# Patient Record
Sex: Male | Born: 1965 | Race: White | Hispanic: No | Marital: Married | Smoking: Never smoker
Health system: Southern US, Community
[De-identification: ages and names within clinical notes are randomized; demographics above are authoritative.]

## PROBLEM LIST (undated history)

## (undated) DIAGNOSIS — J111 Influenza due to unidentified influenza virus with other respiratory manifestations: Secondary | ICD-10-CM

## (undated) DIAGNOSIS — G6281 Critical illness polyneuropathy: Secondary | ICD-10-CM

## (undated) DIAGNOSIS — R5381 Other malaise: Secondary | ICD-10-CM

## (undated) DIAGNOSIS — J9690 Respiratory failure, unspecified, unspecified whether with hypoxia or hypercapnia: Secondary | ICD-10-CM

## (undated) DIAGNOSIS — G629 Polyneuropathy, unspecified: Secondary | ICD-10-CM

## (undated) DIAGNOSIS — M6282 Rhabdomyolysis: Secondary | ICD-10-CM

## (undated) DIAGNOSIS — N179 Acute kidney failure, unspecified: Secondary | ICD-10-CM

## (undated) DIAGNOSIS — D649 Anemia, unspecified: Secondary | ICD-10-CM

## (undated) HISTORY — DX: Acute kidney failure, unspecified: N17.9

## (undated) HISTORY — DX: Polyneuropathy, unspecified: G62.9

## (undated) HISTORY — PX: WISDOM TOOTH EXTRACTION: SHX21

## (undated) HISTORY — DX: Other malaise: R53.81

## (undated) HISTORY — DX: Critical illness polyneuropathy: G62.81

## (undated) HISTORY — DX: Respiratory failure, unspecified, unspecified whether with hypoxia or hypercapnia: J96.90

## (undated) HISTORY — DX: Influenza due to unidentified influenza virus with other respiratory manifestations: J11.1

## (undated) HISTORY — PX: TONSILLECTOMY: SUR1361

## (undated) HISTORY — PX: NASAL SINUS SURGERY: SHX719

## (undated) HISTORY — DX: Rhabdomyolysis: M62.82

## (undated) HISTORY — DX: Anemia, unspecified: D64.9

---

## 2004-07-22 ENCOUNTER — Ambulatory Visit: Payer: Self-pay | Admitting: Family Medicine

## 2004-12-26 ENCOUNTER — Ambulatory Visit: Payer: Self-pay | Admitting: Family Medicine

## 2006-09-21 ENCOUNTER — Ambulatory Visit: Payer: Self-pay | Admitting: Family Medicine

## 2006-10-29 ENCOUNTER — Telehealth (INDEPENDENT_AMBULATORY_CARE_PROVIDER_SITE_OTHER): Payer: Self-pay | Admitting: *Deleted

## 2007-01-29 ENCOUNTER — Telehealth: Payer: Self-pay | Admitting: Family Medicine

## 2007-03-23 ENCOUNTER — Ambulatory Visit: Payer: Self-pay | Admitting: Family Medicine

## 2007-03-23 DIAGNOSIS — R0609 Other forms of dyspnea: Secondary | ICD-10-CM | POA: Insufficient documentation

## 2007-03-23 DIAGNOSIS — R0989 Other specified symptoms and signs involving the circulatory and respiratory systems: Secondary | ICD-10-CM | POA: Insufficient documentation

## 2007-03-23 DIAGNOSIS — J069 Acute upper respiratory infection, unspecified: Secondary | ICD-10-CM | POA: Insufficient documentation

## 2007-04-06 ENCOUNTER — Telehealth (INDEPENDENT_AMBULATORY_CARE_PROVIDER_SITE_OTHER): Payer: Self-pay | Admitting: Internal Medicine

## 2007-12-02 ENCOUNTER — Ambulatory Visit: Payer: Self-pay | Admitting: Family Medicine

## 2007-12-02 DIAGNOSIS — J301 Allergic rhinitis due to pollen: Secondary | ICD-10-CM | POA: Insufficient documentation

## 2008-01-10 ENCOUNTER — Ambulatory Visit: Payer: Self-pay | Admitting: Family Medicine

## 2008-01-10 DIAGNOSIS — J310 Chronic rhinitis: Secondary | ICD-10-CM | POA: Insufficient documentation

## 2008-01-18 ENCOUNTER — Telehealth (INDEPENDENT_AMBULATORY_CARE_PROVIDER_SITE_OTHER): Payer: Self-pay | Admitting: Internal Medicine

## 2008-01-20 ENCOUNTER — Telehealth (INDEPENDENT_AMBULATORY_CARE_PROVIDER_SITE_OTHER): Payer: Self-pay | Admitting: Internal Medicine

## 2008-01-20 ENCOUNTER — Encounter (INDEPENDENT_AMBULATORY_CARE_PROVIDER_SITE_OTHER): Payer: Self-pay | Admitting: Internal Medicine

## 2008-01-21 ENCOUNTER — Telehealth (INDEPENDENT_AMBULATORY_CARE_PROVIDER_SITE_OTHER): Payer: Self-pay | Admitting: *Deleted

## 2008-05-23 ENCOUNTER — Ambulatory Visit: Payer: Self-pay | Admitting: Family Medicine

## 2008-05-23 DIAGNOSIS — J3489 Other specified disorders of nose and nasal sinuses: Secondary | ICD-10-CM | POA: Insufficient documentation

## 2008-05-25 ENCOUNTER — Telehealth (INDEPENDENT_AMBULATORY_CARE_PROVIDER_SITE_OTHER): Payer: Self-pay | Admitting: Internal Medicine

## 2008-05-30 ENCOUNTER — Telehealth (INDEPENDENT_AMBULATORY_CARE_PROVIDER_SITE_OTHER): Payer: Self-pay | Admitting: *Deleted

## 2008-06-29 ENCOUNTER — Ambulatory Visit: Payer: Self-pay | Admitting: Family Medicine

## 2008-07-04 ENCOUNTER — Telehealth (INDEPENDENT_AMBULATORY_CARE_PROVIDER_SITE_OTHER): Payer: Self-pay | Admitting: Internal Medicine

## 2008-07-06 ENCOUNTER — Encounter (INDEPENDENT_AMBULATORY_CARE_PROVIDER_SITE_OTHER): Payer: Self-pay | Admitting: Internal Medicine

## 2008-07-06 ENCOUNTER — Telehealth (INDEPENDENT_AMBULATORY_CARE_PROVIDER_SITE_OTHER): Payer: Self-pay | Admitting: Internal Medicine

## 2009-02-14 ENCOUNTER — Ambulatory Visit: Payer: Self-pay | Admitting: Family Medicine

## 2009-02-14 DIAGNOSIS — L259 Unspecified contact dermatitis, unspecified cause: Secondary | ICD-10-CM | POA: Insufficient documentation

## 2009-02-14 DIAGNOSIS — L03119 Cellulitis of unspecified part of limb: Secondary | ICD-10-CM

## 2009-02-14 DIAGNOSIS — L02619 Cutaneous abscess of unspecified foot: Secondary | ICD-10-CM | POA: Insufficient documentation

## 2009-02-15 ENCOUNTER — Encounter: Payer: Self-pay | Admitting: Family Medicine

## 2010-11-04 ENCOUNTER — Ambulatory Visit (INDEPENDENT_AMBULATORY_CARE_PROVIDER_SITE_OTHER): Payer: BC Managed Care – PPO | Admitting: Family Medicine

## 2010-11-04 ENCOUNTER — Encounter: Payer: Self-pay | Admitting: Family Medicine

## 2010-11-04 VITALS — BP 118/70 | HR 120 | Temp 102.7°F | Ht 71.75 in | Wt 185.5 lb

## 2010-11-04 DIAGNOSIS — R509 Fever, unspecified: Secondary | ICD-10-CM | POA: Insufficient documentation

## 2010-11-04 MED ORDER — DOXYCYCLINE HYCLATE 100 MG PO TABS: 100.0000 mg | ORAL_TABLET | Freq: Two times a day (BID) | ORAL | Status: AC

## 2010-11-04 NOTE — Progress Notes (Signed)
Fever. Fever and chills Friday night, worse Saturday AM.  Arms were sore, this is better now. Chills continue along with sweats at night. Taking 2 ibuprofen otc q6h.  Had been drinking a liter of mtn dew today.  "Caffeine keeps me going."  Minimal cough, noted today at work. No vomiting, diarrhea, GI sx.  No ear symptoms except for L ear ringing.  H/o menieres.  Not dizzy. No ST, no rhinorrhea.  Tick bite 3 weeks ago. No rash.  No sick contacts.  Not sob, no CP, no lightheadedness or presyncope.    No tob Etoh on the weekend, 6 pack/weekend.   No drugs.  Healthy o/w except for menieres.    GEN: nad, alert and oriented HEENT: mucous membranes mildly dry but no exudates, tm wnl, nasal exam w/o erythema NECK: supple w/o LA CV: tachy but regular PULM: ctab, no inc wob ABD: soft, +bs EXT: no edema SKIN: no acute rash

## 2010-11-04 NOTE — Assessment & Plan Note (Addendum)
Potentially tick related- start doxy.  Wynelle Link caution given.  D/w pt about checking labs given the mildly dry mucous membranes.  He doesn't have at ST to suggest strep.  He wanted to hold off on the labs.  He'll call back about this if needed.  D/w pt about getting off caffeine and drinking healthier fluids.  He understood.  In spite of fever, he is nontoxic, pleasant in conversation and in NAD. Okay for outpatient fu.

## 2010-11-04 NOTE — Patient Instructions (Signed)
I would cut back on the caffeine, drink plenty of water, and get some rest.  Start the doxycycline and you can get your results through our phone system.  Follow the instructions on the blue card. Be careful not to get sunburned on the doxycycline.   If you have other concerns, then notify us.

## 2010-11-06 ENCOUNTER — Telehealth: Payer: Self-pay | Admitting: *Deleted

## 2010-11-06 LAB — BASIC METABOLIC PANEL
CO2: 22 mmol/L
Chloride: 102 mmol/L
Potassium: 3.7 mmol/L

## 2010-11-06 LAB — CBC AND DIFFERENTIAL
Hemoglobin: 12.8 g/dL — AB (ref 13.5–17.5)
Neutrophils Absolute: 1 /uL
RBC: 4.18
WBC: 3.5
platelet count: 119

## 2010-11-06 NOTE — Telephone Encounter (Signed)
Patient advised. Rx left up front for pick up.

## 2010-11-06 NOTE — Telephone Encounter (Signed)
Pt was seen on Monday and he is still having fevers at night.  He is asking if he can have the labwork done that you had suggested.  He wants to pick up an order to take to [facility].  Please call when ready.

## 2010-11-06 NOTE — Telephone Encounter (Signed)
rx written for cbc and bmet.  In my out box.  Thanks.  If pt continues to have fevers into next week or other symptoms in meantime- sob, progressive cough, rash, etc, he needs recheck by MD.

## 2010-11-08 ENCOUNTER — Encounter: Payer: Self-pay | Admitting: Family Medicine

## 2010-11-08 ENCOUNTER — Telehealth: Payer: Self-pay | Admitting: *Deleted

## 2010-11-08 NOTE — Telephone Encounter (Signed)
Patient aware of script. Placed up front for pick up.

## 2010-11-08 NOTE — Telephone Encounter (Signed)
Order written for CBC, dx fever, draw on/after 11/11/10.

## 2010-11-08 NOTE — Telephone Encounter (Signed)
Spoke with patient and notified him of results. He said he is feeling some better, but not 100% yet. He said he hasn't been checking his temp, but he has been having night sweats. He said that they are progressively getting better (each night is better than the previous one), but he has been concerned about them. He will need a written order for the labs. He will have them rechecked next week. He will also schedule a follow up for next week if he doesn't feel any better after the weekend.

## 2010-11-22 ENCOUNTER — Encounter: Payer: Self-pay | Admitting: Family Medicine

## 2010-12-10 ENCOUNTER — Telehealth: Payer: Self-pay | Admitting: *Deleted

## 2010-12-10 MED ORDER — SCOPOLAMINE 1 MG/3DAYS TD PT72: 1.0000 | MEDICATED_PATCH | TRANSDERMAL | Status: DC

## 2010-12-10 NOTE — Telephone Encounter (Signed)
Patient notified

## 2010-12-10 NOTE — Telephone Encounter (Signed)
Sent in.  Please notify pt.  Thanks.

## 2010-12-10 NOTE — Telephone Encounter (Signed)
Pt is going on a 5 day boat trip and has a lot of problems with dizziness.  He is asking if something for motion sickness can be called to [employer].

## 2011-04-07 ENCOUNTER — Ambulatory Visit (INDEPENDENT_AMBULATORY_CARE_PROVIDER_SITE_OTHER): Payer: BC Managed Care – PPO | Admitting: Family Medicine

## 2011-04-07 ENCOUNTER — Encounter: Payer: Self-pay | Admitting: Family Medicine

## 2011-04-07 VITALS — BP 122/76 | HR 80 | Temp 98.5°F | Wt 200.1 lb

## 2011-04-07 DIAGNOSIS — H8109 Meniere's disease, unspecified ear: Secondary | ICD-10-CM

## 2011-04-07 MED ORDER — DIAZEPAM 5 MG PO TABS: 5.0000 mg | ORAL_TABLET | Freq: Three times a day (TID) | ORAL | Status: AC | PRN

## 2011-04-07 NOTE — Progress Notes (Signed)
L ear symptoms.  H/o meniere's disease, esp on L ear.  Now with ringing in L ear.  No pain, not dizzy.  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  New tinnitius started ~10 days ago.  No change in sx over last few days.  Was prev on valium for vertigo and also HCTZ prev.    Meds, vitals, and allergies reviewed.   ROS: See HPI.  Otherwise, noncontributory.  GEN: nad, alert and oriented HEENT: mucous membranes moist, tms and canals wnl Air>bone B and weber localizes to L ear NECK: supple w/o LA CV: rrr. PULM: ctab, no inc wob

## 2011-04-07 NOTE — Patient Instructions (Signed)
See Shirlee Limerick about your referral before your leave today. Take the valium if you have bout of vertigo.  It can make you drowsy.  Take care.

## 2011-04-08 DIAGNOSIS — H8109 Meniere's disease, unspecified ear: Secondary | ICD-10-CM | POA: Insufficient documentation

## 2011-04-08 NOTE — Assessment & Plan Note (Signed)
I called and d/w Dr. Pollyann Kennedy.  No new meds started since no vertigo.  Refer back to ENT.  Pt agrees.  Will give rx for valium if he has episode of vertigo.  He agrees with plan.  It appears that he doesn't have acute hearing loss.  He has baseline hearing loss worsened by recent on set of L tinnitus.

## 2011-08-25 ENCOUNTER — Ambulatory Visit (INDEPENDENT_AMBULATORY_CARE_PROVIDER_SITE_OTHER): Payer: BC Managed Care – PPO | Admitting: Family Medicine

## 2011-08-25 ENCOUNTER — Encounter: Payer: Self-pay | Admitting: Family Medicine

## 2011-08-25 VITALS — BP 122/80 | HR 70 | Temp 98.6°F | Wt 206.0 lb

## 2011-08-25 DIAGNOSIS — R21 Rash and other nonspecific skin eruption: Secondary | ICD-10-CM

## 2011-08-25 MED ORDER — PREDNISONE 5 MG PO KIT: PACK | ORAL | Status: DC

## 2011-08-25 NOTE — Patient Instructions (Signed)
Take the steroids with food and let me know if that doesn't help.  Take care.

## 2011-08-25 NOTE — Progress Notes (Signed)
Rash. Had h/o rash and old rx for lidex.  It helped some.  Now with rash on back and neck since Christmas.  Also lesions on chest.  No palmar lesions.  Itches.  No known trigger.  No one else with similar.  He used it on his neck and it helped some, temporary relief.  No FCNAVD.  No tick bites. Feels okay o/w.  No new soaps, detergents, etc.   Meds, vitals, and allergies reviewed.   ROS: See HPI.  Otherwise, noncontributory.  nad ncat rrr ctab Mildly excoriated blanching red papules on back and chest, no oral or palmar lesions

## 2011-08-26 DIAGNOSIS — L989 Disorder of the skin and subcutaneous tissue, unspecified: Secondary | ICD-10-CM | POA: Insufficient documentation

## 2011-08-26 NOTE — Assessment & Plan Note (Signed)
Unclear source, but no red flags by hx or exam.  Will treat with pred taper and he'll notify us if returned, so we can set up allergy vs derm eval.  Steroid caution.

## 2014-05-18 ENCOUNTER — Ambulatory Visit (INDEPENDENT_AMBULATORY_CARE_PROVIDER_SITE_OTHER): Payer: BC Managed Care – PPO | Admitting: Family Medicine

## 2014-05-18 ENCOUNTER — Encounter: Payer: Self-pay | Admitting: Family Medicine

## 2014-05-18 VITALS — BP 134/88 | HR 87 | Temp 98.1°F | Wt 189.8 lb

## 2014-05-18 DIAGNOSIS — J069 Acute upper respiratory infection, unspecified: Secondary | ICD-10-CM

## 2014-05-18 MED ORDER — AZITHROMYCIN 250 MG PO TABS: ORAL_TABLET | ORAL | Status: DC

## 2014-05-18 MED ORDER — HYDROCOD POLST-CHLORPHEN POLST 10-8 MG/5ML PO LQCR: 5.0000 mL | Freq: Every evening | ORAL | Status: DC | PRN

## 2014-05-18 NOTE — Progress Notes (Signed)
SUBJECTIVE:  John Hoover is a [age] y.o. male pt of Dr. Para Marchuncan, new to me, who complains of coryza, congestion, productive cough and clear nasal discharge for 3 days. He denies a history of anorexia, chest pain, fevers, myalgias and shortness of breath and denies a history of asthma. Patient denies smoke cigarettes.   Current Outpatient Prescriptions on File Prior to Visit  Medication Sig Dispense Refill  . fluocinonide ointment (LIDEX) 0.05 % Apply 1 application topically 2 (two) times daily.    . Ibuprofen 200 MG CAPS Take by mouth as needed.       No current facility-administered medications on file prior to visit.    Allergies  Allergen Reactions  . Desloratadine     REACTION: doesn't work  . Fexofenadine     REACTION: doesn't work  . Loratadine     REACTION: does not work    Past Medical History  Diagnosis Date  . Meniere's disease     History reviewed. No pertinent past surgical history.  History reviewed. No pertinent family history.  History   Social History  . Marital Status: Married    Spouse Name: N/A    Number of Children: N/A  . Years of Education: N/A   Occupational History  . Works in Product/process development scientistconcrete/ building    Social History Main Topics  . Smoking status: Never Smoker   . Smokeless tobacco: Never Used  . Alcohol Use: Yes     Comment: weendends  . Drug Use: No  . Sexual Activity: Not on file   Other Topics Concern  . Not on file   Social History Narrative   The PMH, PSH, Social History, Family History, Medications, and allergies [facility], and have been updated if relevant.  OBJECTIVE: BP 134/88 mmHg  Pulse 87  Temp(Src) 98.1 F (36.7 C) (Oral)  Wt 189 lb 12 oz (86.07 kg)  SpO2 98%  He appears well, vital signs are as noted. Ears normal.  Throat and pharynx normal.  Neck supple. No adenopathy in the neck. Nose is congested. Sinuses non tender. The chest is clear, without wheezes or rales.  ASSESSMENT:  viral upper  respiratory illness  PLAN: Symptomatic therapy suggested: push fluids, rest and return office visit prn if symptoms persist or worsen. Lack of antibiotic effectiveness discussed with him. Call or return to clinic prn if these symptoms worsen or fail to improve as anticipated. Given rx for tussionex to use at bedtime and zpack to fill if symptoms do not improve as expected.

## 2014-05-18 NOTE — Progress Notes (Signed)
Pre visit review using our clinic review tool, if applicable. No additional management support is needed unless otherwise documented below in the visit note. 

## 2014-05-18 NOTE — Patient Instructions (Signed)
Great to see you. Happy Holidays.  Use tussionex as needed at bedtime as needed for cough.  Ok to fill zpack if symptoms progressing or not improving over next couple of days.

## 2014-08-13 ENCOUNTER — Other Ambulatory Visit: Payer: Self-pay | Admitting: Family Medicine

## 2014-08-13 DIAGNOSIS — Z131 Encounter for screening for diabetes mellitus: Secondary | ICD-10-CM

## 2014-08-13 DIAGNOSIS — Z13220 Encounter for screening for lipoid disorders: Secondary | ICD-10-CM

## 2014-08-17 ENCOUNTER — Other Ambulatory Visit (INDEPENDENT_AMBULATORY_CARE_PROVIDER_SITE_OTHER): Payer: BLUE CROSS/BLUE SHIELD

## 2014-08-17 DIAGNOSIS — Z13220 Encounter for screening for lipoid disorders: Secondary | ICD-10-CM

## 2014-08-17 DIAGNOSIS — Z131 Encounter for screening for diabetes mellitus: Secondary | ICD-10-CM

## 2014-08-17 LAB — LIPID PANEL
CHOL/HDL RATIO: 5
CHOLESTEROL: 232 mg/dL — AB (ref 0–200)
HDL: 47.6 mg/dL (ref >39.00)
LDL CALC: 171 mg/dL — AB (ref 0–99)
NONHDL: 184.4
Triglycerides: 65 mg/dL (ref 0.0–149.0)
VLDL: 13 mg/dL (ref 0.0–40.0)

## 2014-08-17 LAB — GLUCOSE, RANDOM: GLUCOSE: 107 mg/dL — AB (ref 70–99)

## 2014-08-22 ENCOUNTER — Ambulatory Visit (INDEPENDENT_AMBULATORY_CARE_PROVIDER_SITE_OTHER): Payer: BLUE CROSS/BLUE SHIELD | Admitting: Family Medicine

## 2014-08-22 ENCOUNTER — Encounter: Payer: Self-pay | Admitting: Family Medicine

## 2014-08-22 VITALS — BP 110/76 | HR 70 | Temp 98.6°F | Ht 73.0 in | Wt 197.8 lb

## 2014-08-22 DIAGNOSIS — R739 Hyperglycemia, unspecified: Secondary | ICD-10-CM

## 2014-08-22 DIAGNOSIS — Z7189 Other specified counseling: Secondary | ICD-10-CM

## 2014-08-22 DIAGNOSIS — Z0000 Encounter for general adult medical examination without abnormal findings: Secondary | ICD-10-CM

## 2014-08-22 DIAGNOSIS — Z1211 Encounter for screening for malignant neoplasm of colon: Secondary | ICD-10-CM

## 2014-08-22 NOTE — Patient Instructions (Addendum)
Go to the lab on the way out.  We'll contact you with your lab report (stool cards).  I would get a flu shot each fall.   Recheck lipids and sugar in about 5 months (fasting lab visit). Take care.  Glad to see you.

## 2014-08-22 NOTE — Progress Notes (Signed)
Pre visit review using our clinic review tool, if applicable. No additional management support is needed unless otherwise documented below in the visit note.  CPE- See plan.  Routine anticipatory guidance given to patient.  See health maintenance. Tetanus done ~6 years ago, ~2010. Flu encouraged.  PNA not due  Shingles not due D/w patient WU:[id number]:options for colon cancer screening, including IFOB vs. colonoscopy.  Risks and benefits of both were discussed and patient voiced understanding.  Pt elects for: IFOB.  Prostate cancer screening and PSA options (with potential risks and benefits of testing vs not testing) were discussed along with recent recs/guidelines.  He declined testing PSA at this point. Living will d/w pt.  Wife designated if patient were incapacitated.   Diet and exercise d/w pt.  Walking 4 miles per day.  Diet is "not very good".  Meals are on the run.  Sugar and lipids d/w pt.  D/w pt about recheck in a few months.  He'll work on diet/exercise in the meantime.   Occ tinnitus but no other vertigo sx.    PMH and SH reviewed  Meds, vitals, and allergies reviewed.   ROS: See HPI.  Otherwise negative.    GEN: nad, alert and oriented HEENT: mucous membranes moist NECK: supple w/o LA CV: rrr. PULM: ctab, no inc wob ABD: soft, +bs EXT: no edema SKIN: no acute rash

## 2014-08-23 DIAGNOSIS — Z0000 Encounter for general adult medical examination without abnormal findings: Secondary | ICD-10-CM | POA: Insufficient documentation

## 2014-08-23 DIAGNOSIS — Z7189 Other specified counseling: Secondary | ICD-10-CM | POA: Insufficient documentation

## 2014-08-23 NOTE — Assessment & Plan Note (Signed)
Routine anticipatory guidance given to patient.  See health maintenance. Tetanus done ~6 years ago, ~2010. Flu encouraged.  PNA not due  Shingles not due D/w patient MW:[id number]:options for colon cancer screening, including IFOB vs. colonoscopy.  Risks and benefits of both were discussed and patient voiced understanding.  Pt elects for: IFOB.  Prostate cancer screening and PSA options (with potential risks and benefits of testing vs not testing) were discussed along with recent recs/guidelines.  He declined testing PSA at this point. Living will d/w pt.  Wife designated if patient were incapacitated.   Diet and exercise d/w pt.  Walking 4 miles per day.  Diet is "not very good".  Meals are on the run.  Sugar and lipids d/w pt.  D/w pt about recheck in a few months.  He'll work on diet/exercise in the meantime.

## 2014-08-23 NOTE — Addendum Note (Signed)
Addended by: Baldomero LamyHAVERS, NATASHA C on: 08/23/2014 03:46 PM   Modules accepted: Kipp BroodSmartSet

## 2015-02-15 ENCOUNTER — Encounter: Payer: Self-pay | Admitting: Primary Care

## 2015-02-15 ENCOUNTER — Ambulatory Visit (INDEPENDENT_AMBULATORY_CARE_PROVIDER_SITE_OTHER): Payer: BLUE CROSS/BLUE SHIELD | Admitting: Primary Care

## 2015-02-15 VITALS — BP 126/72 | HR 71 | Temp 97.9°F | Ht 73.0 in | Wt 190.8 lb

## 2015-02-15 DIAGNOSIS — H8109 Meniere's disease, unspecified ear: Secondary | ICD-10-CM

## 2015-02-15 NOTE — Progress Notes (Signed)
Pre visit review using our clinic review tool, if applicable. No additional management support is needed unless otherwise documented below in the visit note. 

## 2015-02-15 NOTE — Assessment & Plan Note (Signed)
Ringing in ears x 1 week, worse in AM when waking. No dizziness. Exam unremarkable. AC>BC. No obvious signs of eustation tube dysfunction. He has an appointment scheduled with ENT next week, encouraged him to keep this appointment. He is to notify us if he develops dizziness.

## 2015-02-15 NOTE — Progress Notes (Signed)
   Subjective:    Patient ID: John Hoover, male    DOB: 1966/03/04, 49 y.o.   MRN: [id number]  HPI  John Hoover is a 49 year old male who presents today with a chief complaint of tinnitus. His tinnitus is present to bilateral ears and has been present for the past week. The intensity varies. 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 His symptoms are similar to his meniere's. Denies dizziness. He's had work up through [facility] with Dr. Jearld Fenton years ago including MRI. 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 He's had intermittent ringing since evaluation years ago which would gradually dissipate on its own. He has an appointment with [facility] ENT next week.  Review of Systems  HENT: Positive for tinnitus. Negative for congestion, rhinorrhea, sinus pressure and sneezing.   Eyes: Negative for visual disturbance.  Respiratory: Negative for cough.   Neurological: Negative for dizziness.       Past Medical History  Diagnosis Date  . Meniere's disease     Social History   Social History  . Marital Status: Married    Spouse Name: N/A  . Number of Children: N/A  . Years of Education: N/A   Occupational History  . Works in Product/process development scientist    Social History Main Topics  . Smoking status: Never Smoker   . Smokeless tobacco: Never Used  . Alcohol Use: Yes     Comment: weendends  . Drug Use: No  . Sexual Activity: Not on file   Other Topics Concern  . Not on file   Social History Narrative   1 daughter (who has DM1) and 1 son   Married 25+ years    Past Surgical History  Procedure Laterality Date  . Nasal sinus surgery      Family History  Problem Relation Age of Onset  . Hypertension Mother   . Hypertension Father   . Colon cancer Neg Hx   . Prostate cancer Neg Hx     Allergies  Allergen Reactions  . Desloratadine     REACTION: doesn't work  . Fexofenadine     REACTION: doesn't work  . Loratadine     REACTION: does  not work    Current Outpatient Prescriptions on File Prior to Visit  Medication Sig Dispense Refill  . Ibuprofen 200 MG CAPS Take by mouth as needed.      . fluocinonide ointment (LIDEX) 0.05 % Apply 1 application topically 2 (two) times daily.     No current facility-administered medications on file prior to visit.    BP 126/72 mmHg  Pulse 71  Temp(Src) 97.9 F (36.6 C) (Oral)  Ht  (1.854 m)  Wt 190 lb 12.8 oz (86.546 kg)  BMI 25.18 kg/m2  SpO2 97%    Objective:   Physical Exam  Constitutional: He appears well-nourished.  HENT:  Right Ear: Tympanic membrane and ear canal normal.  Left Ear: Tympanic membrane and ear canal normal.  AC > BC.  No obvious eustation tube dysfunction noted upon exam.  Able to hear during exam without raising voice level.  Eyes: EOM are normal. Pupils are equal, round, and reactive to light.  Cardiovascular: Normal rate and regular rhythm.   Pulmonary/Chest: Effort normal and breath sounds normal.  Neurological: No cranial nerve deficit.  Skin: Skin is warm and dry.          Assessment & Plan:

## 2015-02-15 NOTE — Patient Instructions (Signed)
Keep your appointment with [facility] next week.   Please notify us if you develop dizziness.  It was a pleasure meeting you!

## 2015-03-22 ENCOUNTER — Other Ambulatory Visit (INDEPENDENT_AMBULATORY_CARE_PROVIDER_SITE_OTHER): Payer: BLUE CROSS/BLUE SHIELD

## 2015-03-22 DIAGNOSIS — R739 Hyperglycemia, unspecified: Secondary | ICD-10-CM

## 2015-03-22 LAB — LIPID PANEL
CHOL/HDL RATIO: 4
Cholesterol: 208 mg/dL — ABNORMAL HIGH (ref 0–200)
HDL: 52.4 mg/dL (ref >39.00)
LDL Cholesterol: 144 mg/dL — ABNORMAL HIGH (ref 0–99)
NONHDL: 155.27
TRIGLYCERIDES: 54 mg/dL (ref 0.0–149.0)
VLDL: 10.8 mg/dL (ref 0.0–40.0)

## 2015-03-22 LAB — GLUCOSE, RANDOM: Glucose, Bld: 92 mg/dL (ref 70–99)

## 2015-08-24 ENCOUNTER — Telehealth: Payer: Self-pay | Admitting: *Deleted

## 2015-08-24 NOTE — Telephone Encounter (Signed)
Pt needs to be seen at urgent care or sat clinic . Can use ibuprofen or tylenol for sore throat, mucinex DM for cough.

## 2015-08-24 NOTE — Telephone Encounter (Signed)
Patient calls and left message with triage at 4:17 pm saying that he has a cough and sore throat and is pretty bad off and wants something called in.

## 2015-08-24 NOTE — Telephone Encounter (Signed)
Patient advised.

## 2015-08-27 ENCOUNTER — Encounter: Payer: Self-pay | Admitting: Family Medicine

## 2015-08-27 ENCOUNTER — Encounter: Payer: Self-pay | Admitting: Emergency Medicine

## 2015-08-27 ENCOUNTER — Ambulatory Visit (INDEPENDENT_AMBULATORY_CARE_PROVIDER_SITE_OTHER): Payer: BLUE CROSS/BLUE SHIELD | Admitting: Family Medicine

## 2015-08-27 ENCOUNTER — Inpatient Hospital Stay
Admission: EM | Admit: 2015-08-27 | Discharge: 2015-09-13 | DRG: 870 | Disposition: A | Discharge status: Other Acute Inpatient Hospital | Payer: BLUE CROSS/BLUE SHIELD | Attending: Pulmonary Disease | Admitting: Pulmonary Disease

## 2015-08-27 ENCOUNTER — Emergency Department: Payer: BLUE CROSS/BLUE SHIELD

## 2015-08-27 VITALS — HR 60 | Temp 98.0°F | Ht 73.0 in | Wt 202.6 lb

## 2015-08-27 DIAGNOSIS — R230 Cyanosis: Secondary | ICD-10-CM

## 2015-08-27 DIAGNOSIS — R140 Abdominal distension (gaseous): Secondary | ICD-10-CM | POA: Diagnosis not present

## 2015-08-27 DIAGNOSIS — F05 Delirium due to known physiological condition: Secondary | ICD-10-CM | POA: Diagnosis not present

## 2015-08-27 DIAGNOSIS — N5089 Other specified disorders of the male genital organs: Secondary | ICD-10-CM | POA: Diagnosis not present

## 2015-08-27 DIAGNOSIS — Z8249 Family history of ischemic heart disease and other diseases of the circulatory system: Secondary | ICD-10-CM

## 2015-08-27 DIAGNOSIS — E872 Acidosis, unspecified: Secondary | ICD-10-CM | POA: Diagnosis present

## 2015-08-27 DIAGNOSIS — D751 Secondary polycythemia: Secondary | ICD-10-CM | POA: Diagnosis present

## 2015-08-27 DIAGNOSIS — M7989 Other specified soft tissue disorders: Secondary | ICD-10-CM | POA: Diagnosis not present

## 2015-08-27 DIAGNOSIS — R1310 Dysphagia, unspecified: Secondary | ICD-10-CM | POA: Diagnosis not present

## 2015-08-27 DIAGNOSIS — I10 Essential (primary) hypertension: Secondary | ICD-10-CM | POA: Diagnosis present

## 2015-08-27 DIAGNOSIS — J9600 Acute respiratory failure, unspecified whether with hypoxia or hypercapnia: Secondary | ICD-10-CM | POA: Diagnosis not present

## 2015-08-27 DIAGNOSIS — E785 Hyperlipidemia, unspecified: Secondary | ICD-10-CM | POA: Diagnosis present

## 2015-08-27 DIAGNOSIS — R4781 Slurred speech: Secondary | ICD-10-CM

## 2015-08-27 DIAGNOSIS — R6521 Severe sepsis with septic shock: Secondary | ICD-10-CM | POA: Diagnosis present

## 2015-08-27 DIAGNOSIS — K567 Ileus, unspecified: Secondary | ICD-10-CM

## 2015-08-27 DIAGNOSIS — J9690 Respiratory failure, unspecified, unspecified whether with hypoxia or hypercapnia: Secondary | ICD-10-CM

## 2015-08-27 DIAGNOSIS — J95851 Ventilator associated pneumonia: Secondary | ICD-10-CM | POA: Diagnosis not present

## 2015-08-27 DIAGNOSIS — K921 Melena: Secondary | ICD-10-CM | POA: Diagnosis not present

## 2015-08-27 DIAGNOSIS — J101 Influenza due to other identified influenza virus with other respiratory manifestations: Secondary | ICD-10-CM | POA: Diagnosis present

## 2015-08-27 DIAGNOSIS — B9689 Other specified bacterial agents as the cause of diseases classified elsewhere: Secondary | ICD-10-CM | POA: Diagnosis present

## 2015-08-27 DIAGNOSIS — R7989 Other specified abnormal findings of blood chemistry: Secondary | ICD-10-CM | POA: Diagnosis not present

## 2015-08-27 DIAGNOSIS — R52 Pain, unspecified: Secondary | ICD-10-CM

## 2015-08-27 DIAGNOSIS — N4889 Other specified disorders of penis: Secondary | ICD-10-CM | POA: Diagnosis not present

## 2015-08-27 DIAGNOSIS — Z95828 Presence of other vascular implants and grafts: Secondary | ICD-10-CM

## 2015-08-27 DIAGNOSIS — IMO0002 Reserved for concepts with insufficient information to code with codable children: Secondary | ICD-10-CM | POA: Diagnosis present

## 2015-08-27 DIAGNOSIS — D62 Acute posthemorrhagic anemia: Secondary | ICD-10-CM | POA: Diagnosis not present

## 2015-08-27 DIAGNOSIS — J9601 Acute respiratory failure with hypoxia: Secondary | ICD-10-CM | POA: Diagnosis not present

## 2015-08-27 DIAGNOSIS — E8809 Other disorders of plasma-protein metabolism, not elsewhere classified: Secondary | ICD-10-CM | POA: Diagnosis present

## 2015-08-27 DIAGNOSIS — I959 Hypotension, unspecified: Secondary | ICD-10-CM | POA: Diagnosis not present

## 2015-08-27 DIAGNOSIS — R69 Illness, unspecified: Secondary | ICD-10-CM | POA: Diagnosis not present

## 2015-08-27 DIAGNOSIS — R609 Edema, unspecified: Secondary | ICD-10-CM

## 2015-08-27 DIAGNOSIS — A419 Sepsis, unspecified organism: Secondary | ICD-10-CM | POA: Diagnosis not present

## 2015-08-27 DIAGNOSIS — R739 Hyperglycemia, unspecified: Secondary | ICD-10-CM | POA: Diagnosis not present

## 2015-08-27 DIAGNOSIS — E8779 Other fluid overload: Secondary | ICD-10-CM | POA: Diagnosis present

## 2015-08-27 DIAGNOSIS — Z978 Presence of other specified devices: Secondary | ICD-10-CM

## 2015-08-27 DIAGNOSIS — M6282 Rhabdomyolysis: Secondary | ICD-10-CM | POA: Diagnosis not present

## 2015-08-27 DIAGNOSIS — M791 Myalgia, unspecified site: Secondary | ICD-10-CM

## 2015-08-27 DIAGNOSIS — E875 Hyperkalemia: Secondary | ICD-10-CM | POA: Diagnosis not present

## 2015-08-27 DIAGNOSIS — R601 Generalized edema: Secondary | ICD-10-CM | POA: Diagnosis not present

## 2015-08-27 DIAGNOSIS — R0600 Dyspnea, unspecified: Secondary | ICD-10-CM | POA: Diagnosis present

## 2015-08-27 DIAGNOSIS — J156 Pneumonia due to other aerobic Gram-negative bacteria: Secondary | ICD-10-CM | POA: Diagnosis not present

## 2015-08-27 DIAGNOSIS — N390 Urinary tract infection, site not specified: Secondary | ICD-10-CM | POA: Diagnosis present

## 2015-08-27 DIAGNOSIS — R0902 Hypoxemia: Secondary | ICD-10-CM

## 2015-08-27 DIAGNOSIS — D696 Thrombocytopenia, unspecified: Secondary | ICD-10-CM | POA: Diagnosis present

## 2015-08-27 DIAGNOSIS — A4189 Other specified sepsis: Principal | ICD-10-CM | POA: Diagnosis present

## 2015-08-27 DIAGNOSIS — R000 Tachycardia, unspecified: Secondary | ICD-10-CM | POA: Diagnosis present

## 2015-08-27 DIAGNOSIS — N170 Acute kidney failure with tubular necrosis: Secondary | ICD-10-CM | POA: Diagnosis present

## 2015-08-27 DIAGNOSIS — Z4682 Encounter for fitting and adjustment of non-vascular catheter: Secondary | ICD-10-CM | POA: Diagnosis not present

## 2015-08-27 DIAGNOSIS — Y848 Other medical procedures as the cause of abnormal reaction of the patient, or of later complication, without mention of misadventure at the time of the procedure: Secondary | ICD-10-CM | POA: Diagnosis not present

## 2015-08-27 DIAGNOSIS — E871 Hypo-osmolality and hyponatremia: Secondary | ICD-10-CM | POA: Diagnosis present

## 2015-08-27 DIAGNOSIS — B952 Enterococcus as the cause of diseases classified elsewhere: Secondary | ICD-10-CM | POA: Diagnosis present

## 2015-08-27 DIAGNOSIS — R571 Hypovolemic shock: Secondary | ICD-10-CM | POA: Diagnosis present

## 2015-08-27 DIAGNOSIS — G9341 Metabolic encephalopathy: Secondary | ICD-10-CM | POA: Diagnosis not present

## 2015-08-27 DIAGNOSIS — R34 Anuria and oliguria: Secondary | ICD-10-CM | POA: Diagnosis not present

## 2015-08-27 DIAGNOSIS — H8109 Meniere's disease, unspecified ear: Secondary | ICD-10-CM | POA: Diagnosis present

## 2015-08-27 DIAGNOSIS — R400 Somnolence: Secondary | ICD-10-CM | POA: Diagnosis not present

## 2015-08-27 DIAGNOSIS — Z4659 Encounter for fitting and adjustment of other gastrointestinal appliance and device: Secondary | ICD-10-CM

## 2015-08-27 DIAGNOSIS — E860 Dehydration: Secondary | ICD-10-CM | POA: Diagnosis present

## 2015-08-27 DIAGNOSIS — N186 End stage renal disease: Secondary | ICD-10-CM

## 2015-08-27 DIAGNOSIS — R918 Other nonspecific abnormal finding of lung field: Secondary | ICD-10-CM | POA: Diagnosis not present

## 2015-08-27 DIAGNOSIS — Z9911 Dependence on respirator [ventilator] status: Secondary | ICD-10-CM | POA: Diagnosis not present

## 2015-08-27 DIAGNOSIS — R059 Cough, unspecified: Secondary | ICD-10-CM

## 2015-08-27 DIAGNOSIS — R05 Cough: Secondary | ICD-10-CM

## 2015-08-27 DIAGNOSIS — Z452 Encounter for adjustment and management of vascular access device: Secondary | ICD-10-CM

## 2015-08-27 DIAGNOSIS — I495 Sick sinus syndrome: Secondary | ICD-10-CM | POA: Diagnosis not present

## 2015-08-27 DIAGNOSIS — Z789 Other specified health status: Secondary | ICD-10-CM | POA: Diagnosis not present

## 2015-08-27 DIAGNOSIS — N179 Acute kidney failure, unspecified: Secondary | ICD-10-CM | POA: Diagnosis present

## 2015-08-27 DIAGNOSIS — M609 Myositis, unspecified: Secondary | ICD-10-CM | POA: Diagnosis present

## 2015-08-27 DIAGNOSIS — J90 Pleural effusion, not elsewhere classified: Secondary | ICD-10-CM | POA: Diagnosis not present

## 2015-08-27 DIAGNOSIS — J09X2 Influenza due to identified novel influenza A virus with other respiratory manifestations: Secondary | ICD-10-CM | POA: Diagnosis not present

## 2015-08-27 LAB — URINALYSIS COMPLETE WITH MICROSCOPIC (ARMC ONLY)
BILIRUBIN URINE: NEGATIVE
Bacteria, UA: NONE SEEN
GLUCOSE, UA: NEGATIVE mg/dL
HGB URINE DIPSTICK: NEGATIVE
KETONES UR: NEGATIVE mg/dL
LEUKOCYTES UA: NEGATIVE
NITRITE: NEGATIVE
Protein, ur: 30 mg/dL — AB
SPECIFIC GRAVITY, URINE: 1.027 (ref 1.005–1.030)
pH: 5 (ref 5.0–8.0)

## 2015-08-27 LAB — CBC WITH DIFFERENTIAL/PLATELET
Basophils Absolute: 0 10*3/uL (ref 0–0.1)
Basophils Relative: 0 %
EOS PCT: 0 %
Eosinophils Absolute: 0 10*3/uL (ref 0–0.7)
HCT: 59.7 % — ABNORMAL HIGH (ref 40.0–52.0)
Hemoglobin: 19.7 g/dL — ABNORMAL HIGH (ref 13.0–18.0)
LYMPHS ABS: 1.2 10*3/uL (ref 1.0–3.6)
LYMPHS PCT: 11 %
MCH: 29.7 pg (ref 26.0–34.0)
MCHC: 32.9 g/dL (ref 32.0–36.0)
MCV: 90.1 fL (ref 80.0–100.0)
MONO ABS: 1.6 10*3/uL — AB (ref 0.2–1.0)
Monocytes Relative: 14 %
Neutro Abs: 8.1 10*3/uL — ABNORMAL HIGH (ref 1.4–6.5)
Neutrophils Relative %: 75 %
PLATELETS: 125 10*3/uL — AB (ref 150–440)
RBC: 6.63 MIL/uL — ABNORMAL HIGH (ref 4.40–5.90)
RDW: 14.1 % (ref 11.5–14.5)
WBC: 11 10*3/uL — ABNORMAL HIGH (ref 3.8–10.6)

## 2015-08-27 LAB — BASIC METABOLIC PANEL
ANION GAP: 13 (ref 5–15)
BUN: 28 mg/dL — ABNORMAL HIGH (ref 6–20)
CHLORIDE: 105 mmol/L (ref 101–111)
CO2: 14 mmol/L — ABNORMAL LOW (ref 22–32)
CREATININE: 1.56 mg/dL — AB (ref 0.61–1.24)
Calcium: 7.5 mg/dL — ABNORMAL LOW (ref 8.9–10.3)
GFR calc non Af Amer: 50 mL/min — ABNORMAL LOW (ref >60)
GFR, EST AFRICAN AMERICAN: 58 mL/min — AB (ref >60)
Glucose, Bld: 116 mg/dL — ABNORMAL HIGH (ref 65–99)
Potassium: 5.2 mmol/L — ABNORMAL HIGH (ref 3.5–5.1)
SODIUM: 132 mmol/L — AB (ref 135–145)

## 2015-08-27 LAB — COMPREHENSIVE METABOLIC PANEL
ALT: 20 U/L (ref 17–63)
AST: 46 U/L — ABNORMAL HIGH (ref 15–41)
Albumin: 3.2 g/dL — ABNORMAL LOW (ref 3.5–5.0)
Alkaline Phosphatase: 39 U/L (ref 38–126)
Anion gap: 10 (ref 5–15)
BILIRUBIN TOTAL: 0.7 mg/dL (ref 0.3–1.2)
BUN: 26 mg/dL — ABNORMAL HIGH (ref 6–20)
CHLORIDE: 104 mmol/L (ref 101–111)
CO2: 18 mmol/L — ABNORMAL LOW (ref 22–32)
Calcium: 8.3 mg/dL — ABNORMAL LOW (ref 8.9–10.3)
Creatinine, Ser: 1.71 mg/dL — ABNORMAL HIGH (ref 0.61–1.24)
GFR calc Af Amer: 52 mL/min — ABNORMAL LOW (ref >60)
GFR, EST NON AFRICAN AMERICAN: 45 mL/min — AB (ref >60)
Glucose, Bld: 123 mg/dL — ABNORMAL HIGH (ref 65–99)
POTASSIUM: 5.6 mmol/L — AB (ref 3.5–5.1)
Sodium: 132 mmol/L — ABNORMAL LOW (ref 135–145)
TOTAL PROTEIN: 5.9 g/dL — AB (ref 6.5–8.1)

## 2015-08-27 LAB — RAPID INFLUENZA A&B ANTIGENS
Influenza A (ARMC): NEGATIVE
Influenza B (ARMC): NEGATIVE

## 2015-08-27 LAB — MAGNESIUM
Magnesium: 2 mg/dL (ref 1.7–2.4)
Magnesium: 1.8 mg/dL (ref 1.7–2.4)

## 2015-08-27 LAB — TROPONIN I
TROPONIN I: 0.25 ng/mL — AB (ref <0.031)
TROPONIN I: 0.35 ng/mL — AB (ref <0.031)

## 2015-08-27 LAB — INFLUENZA PANEL BY PCR (TYPE A & B)
H1N1FLUPCR: NOT DETECTED
INFLAPCR: POSITIVE — AB
INFLBPCR: NEGATIVE

## 2015-08-27 LAB — CK: CK TOTAL: 1150 U/L — AB (ref 49–397)

## 2015-08-27 MED ORDER — KETOROLAC TROMETHAMINE 30 MG/ML IJ SOLN
15.0000 mg | Freq: Once | INTRAMUSCULAR | Status: AC
  Administered 2015-08-27: 15 mg via INTRAVENOUS
  Filled 2015-08-27: qty 1

## 2015-08-27 MED ORDER — SODIUM CHLORIDE 0.9 % IV SOLN
INTRAVENOUS | Status: DC
  Administered 2015-08-27 – 2015-08-28 (×4): via INTRAVENOUS

## 2015-08-27 MED ORDER — ONDANSETRON HCL 4 MG/2ML IJ SOLN
4.0000 mg | Freq: Four times a day (QID) | INTRAMUSCULAR | Status: DC | PRN
  Administered 2015-08-27 – 2015-08-28 (×2): 4 mg via INTRAVENOUS
  Filled 2015-08-27 (×2): qty 2

## 2015-08-27 MED ORDER — ASPIRIN 81 MG PO CHEW
CHEWABLE_TABLET | ORAL | Status: AC
  Administered 2015-08-27: 324 mg via ORAL
  Filled 2015-08-27: qty 4

## 2015-08-27 MED ORDER — SODIUM CHLORIDE 0.9 % IV BOLUS (SEPSIS)
1000.0000 mL | Freq: Once | INTRAVENOUS | Status: AC
  Administered 2015-08-27: 1000 mL via INTRAVENOUS

## 2015-08-27 MED ORDER — ACETAMINOPHEN 650 MG RE SUPP: 650.0000 mg | Freq: Four times a day (QID) | RECTAL | Status: DC | PRN

## 2015-08-27 MED ORDER — HEPARIN SODIUM (PORCINE) 5000 UNIT/ML IJ SOLN
5000.0000 [IU] | Freq: Three times a day (TID) | INTRAMUSCULAR | Status: DC
  Administered 2015-08-27 – 2015-08-28 (×3): 5000 [IU] via SUBCUTANEOUS
  Filled 2015-08-27 (×3): qty 1

## 2015-08-27 MED ORDER — HYDROCODONE-ACETAMINOPHEN 5-325 MG PO TABS
1.0000 | ORAL_TABLET | ORAL | Status: DC | PRN
  Administered 2015-08-27 – 2015-08-28 (×2): 2 via ORAL
  Filled 2015-08-27 (×2): qty 2

## 2015-08-27 MED ORDER — DEXTROSE 5 % IV SOLN
1.0000 g | INTRAVENOUS | Status: DC
  Administered 2015-08-27: 19:00:00 1 g via INTRAVENOUS
  Filled 2015-08-27 (×2): qty 10

## 2015-08-27 MED ORDER — ONDANSETRON HCL 4 MG PO TABS: 4.0000 mg | ORAL_TABLET | Freq: Four times a day (QID) | ORAL | Status: DC | PRN

## 2015-08-27 MED ORDER — ASPIRIN 81 MG PO CHEW
324.0000 mg | CHEWABLE_TABLET | Freq: Once | ORAL | Status: AC
  Administered 2015-08-27: 324 mg via ORAL

## 2015-08-27 MED ORDER — ALBUTEROL SULFATE (2.5 MG/3ML) 0.083% IN NEBU: 2.5000 mg | INHALATION_SOLUTION | RESPIRATORY_TRACT | Status: DC | PRN

## 2015-08-27 MED ORDER — ACETAMINOPHEN 325 MG PO TABS
650.0000 mg | ORAL_TABLET | Freq: Four times a day (QID) | ORAL | Status: DC | PRN
  Administered 2015-08-27 – 2015-09-13 (×5): 650 mg via ORAL
  Filled 2015-08-27 (×6): qty 2

## 2015-08-27 MED ORDER — OSELTAMIVIR PHOSPHATE 75 MG PO CAPS: 75.0000 mg | ORAL_CAPSULE | Freq: Two times a day (BID) | ORAL | Status: DC

## 2015-08-27 MED ORDER — MORPHINE SULFATE (PF) 4 MG/ML IV SOLN
4.0000 mg | INTRAVENOUS | Status: DC | PRN
  Administered 2015-08-27 – 2015-08-28 (×5): 4 mg via INTRAVENOUS
  Filled 2015-08-27 (×5): qty 1

## 2015-08-27 NOTE — ED Notes (Signed)
Patient arrives from MD office for reported low SAT however on arrival patient noted SAT 98 on room air. States has had cough and fever x 5 days. States has body aches.

## 2015-08-27 NOTE — Progress Notes (Signed)
Pre visit review using our clinic review tool, if applicable. No additional management support is needed unless otherwise documented below in the visit note. 

## 2015-08-27 NOTE — Progress Notes (Signed)
Patient ID: John AoWilliam E Hobart, male   DOB: 19-Oct-1965, 50 y.o.   MRN: [id number]  Marikay AlarEric Sonnenberg, MD Phone: [phone]  John Hoover is a 50 y.o. male who presents today for same-day visit.  Patient notes onset of symptoms Friday with mildly productive cough and body aches. Notes he did an e-visit and got a Z-Pak and Tessalon which did not help. He notes he has progressively developed worsening shortness of breath at rest and with exertion. He notes he feels exhausted walking across the room due to shortness of breath. No chest pain. Notes body aches all over. Fever to 102F on several occasions. He notes shortness of breath at this time.  PMH: nonsmoker.   ROS see history of present illness  Objective  Physical Exam Filed Vitals:   08/27/15 1059  Pulse: 60  Temp: 98 F (36.7 C)    BP Readings from Last 3 Encounters:  02/15/15 126/72  08/22/14 110/76  05/18/14 134/88   Wt Readings from Last 3 Encounters:  08/27/15 202 lb 9.6 oz (91.899 kg)  02/15/15 190 lb 12.8 oz (86.546 kg)  08/22/14 197 lb 12 oz (89.699 kg)    Physical Exam  Constitutional:  Minimal increased work of breathing  HENT:  Head: Normocephalic and atraumatic.  Right Ear: External ear normal.  Left Ear: External ear normal.  Mouth/Throat: Oropharynx is clear and moist. No oropharyngeal exudate.  Eyes: Conjunctivae are normal. Pupils are equal, round, and reactive to light.  Neck: Neck supple.  Cardiovascular: Normal rate, regular rhythm and normal heart sounds.  Exam reveals no gallop and no friction rub.   No murmur heard. Pulmonary/Chest:  Minimal increased work of breathing, coarse breath sounds throughout, no wheezes or crackles  Abdominal: Soft. Bowel sounds are normal. He exhibits no distension. There is no tenderness. There is no rebound and no guarding.  Lymphadenopathy:    He has no cervical adenopathy.  Skin: Skin is warm and dry. He is not diaphoretic. There is pallor.      Assessment/Plan: Please see individual problem list.  Hypoxia Patient presents to clinic with cough and shortness of breath. Previously treated with Z-Pak and had no improvement. He is mildly short of breath on exam. His oxygen saturation is 86%. Unable to register a blood pressure manually or by automated machine. He is not tachycardic. Concern would be for pneumonia or influenza leading to his hypoxia. Given hypoxia and shortness of breath we will send to the emergency room via EMS for further evaluation. CMA called the charge nurse to let them know the patient was on his way.    Marikay AlarEric Sonnenberg, MD [facility] [place]

## 2015-08-27 NOTE — Assessment & Plan Note (Addendum)
Patient presents to clinic with cough and shortness of breath. Previously treated with Z-Pak and had no improvement. He is mildly short of breath on exam. His oxygen saturation is 86%. Unable to register a blood pressure manually or by automated machine. He is not tachycardic. Concern would be for pneumonia or influenza leading to his hypoxia. Given hypoxia and shortness of breath we will send to the emergency room via EMS for further evaluation. CMA called the charge nurse to let them know the patient was on his way.

## 2015-08-27 NOTE — ED Provider Notes (Addendum)
[facility] Emergency Department Provider Note  ____________________________________________  Time seen: Approximately 12:19 PM  I have reviewed the triage vital signs and the nursing notes.   HISTORY  Chief Complaint Cough    HPI John Hoover is a 50 y.o. male with history of Mnire's disease, no other chronic medical problems who presents for evaluation from his primary care doctor's office for cough, SOB and hypoxia today. Patient reports that he has had 5 days of cough, myalgias, fevers and chills. He was unable to see his primary care doctor last week so he had an e-visit and that doctor prescribed him Levaquin. He reports that he has not felt much better. He went to see his primary care doctor today because he was able to get an appointment. While in the clinic his oxygen saturation was noted to be 85%. EMS was called and he was transferred to the emergency department for further evaluation. He is not complaining of chest pain but he reports that he just feels achy all over. No vomiting or diarrhea. No dysuria.   Past Medical History  Diagnosis Date  . Meniere's disease     Patient Active Problem List   Diagnosis Date Noted  . Hypoxia 08/27/2015  . Routine general medical examination at a health care facility 08/23/2014  . Advance care planning 08/23/2014  . Meniere disease 04/08/2011  . ALLERGIC RHINITIS, SEASONAL 12/02/2007    Past Surgical History  Procedure Laterality Date  . Nasal sinus surgery      Current Outpatient Rx  Name  Route  Sig  Dispense  Refill  . fluocinonide ointment (LIDEX) 0.05 %   Topical   Apply 1 application topically 2 (two) times daily.         . Ibuprofen 200 MG CAPS   Oral   Take by mouth as needed.             Allergies Desloratadine; Fexofenadine; and Loratadine  Family History  Problem Relation Age of Onset  . Hypertension Mother   . Hypertension Father   . Colon cancer Neg Hx   . Prostate  cancer Neg Hx     Social History Social History  Substance Use Topics  . Smoking status: Never Smoker   . Smokeless tobacco: Never Used  . Alcohol Use: Yes     Comment: weendends    Review of Systems Constitutional: + fever/chills Eyes: No visual changes. ENT: No sore throat. Cardiovascular: Denies chest pain. Respiratory: + shortness of breath. Gastrointestinal: No abdominal pain.  No nausea, no vomiting.  No diarrhea.  No constipation. Genitourinary: Negative for dysuria. Musculoskeletal: Negative for back pain. Skin: Negative for rash. Neurological: Negative for headaches, focal weakness or numbness.  10-point ROS otherwise negative.  ____________________________________________   PHYSICAL EXAM:  VITAL SIGNS: ED Triage Vitals  Enc Vitals Group     BP 08/27/15 1152 122/90 mmHg     Pulse Rate 08/27/15 1152 114     Resp 08/27/15 1152 20     Temp 08/27/15 1152 98.4 F (36.9 C)     Temp src --      SpO2 08/27/15 1152 98 %     Weight 08/27/15 1152 190 lb (86.183 kg)     Height 08/27/15 1152 6\' 1"  (1.854 m)     Head Cir --      Peak Flow --      Pain Score 08/27/15 1154 8     Pain Loc --  Pain Edu? --      Excl. in GC? --     Constitutional: Alert and oriented. Nontoxic-appearing and in NAD. Eyes: Conjunctivae are normal. PERRL. EOMI. Head: Atraumatic. Nose: No congestion/rhinnorhea. Mouth/Throat: Mucous membranes are dry.  Oropharynx non-erythematous. Neck: No stridor. Supple without meningismus. Cardiovascular: Tachycardic rate, regular rhythm. Grossly normal heart sounds.  Good peripheral circulation. Respiratory: Normal respiratory effort.  No retractions. Lungs CTAB. Gastrointestinal: Soft and nontender. No distention.  No CVA tenderness. Genitourinary: deferred Musculoskeletal: No lower extremity tenderness nor edema.  No joint effusions. Neurologic:  Normal speech and language. No gross focal neurologic deficits are appreciated.  Skin:  Skin is  warm, dry and intact. No rash noted. Psychiatric: Mood and affect are normal. Speech and behavior are normal.  ____________________________________________   LABS (all labs ordered are listed, but only abnormal results are displayed)  Labs Reviewed  CBC WITH DIFFERENTIAL/PLATELET - Abnormal; Notable for the following:    WBC 11.0 (*)    RBC 6.63 (*)    Hemoglobin 19.7 (*)    HCT 59.7 (*)    Platelets 125 (*)    Neutro Abs 8.1 (*)    Monocytes Absolute 1.6 (*)    All other components within normal limits  COMPREHENSIVE METABOLIC PANEL - Abnormal; Notable for the following:    Sodium 132 (*)    Potassium 5.6 (*)    CO2 18 (*)    Glucose, Bld 123 (*)    BUN 26 (*)    Creatinine, Ser 1.71 (*)    Calcium 8.3 (*)    Total Protein 5.9 (*)    Albumin 3.2 (*)    AST 46 (*)    GFR calc non Af Amer 45 (*)    GFR calc Af Amer 52 (*)    All other components within normal limits  CK - Abnormal; Notable for the following:    Total CK 1150 (*)    All other components within normal limits  RAPID INFLUENZA A&B ANTIGENS ([facility] ONLY)  URINALYSIS COMPLETEWITH MICROSCOPIC ([facility] ONLY)  TROPONIN I  MAGNESIUM   ____________________________________________  EKG  ED ECG REPORT I, Gayla Doss, the attending physician, personally viewed and interpreted this ECG.   Date: 08/27/2015  EKG Time: 11:51  Rate: 111  Rhythm: sinus tachycardia  Axis: normal  Intervals:none  ST&T Change: No acute ST elevation.Q waves in lead 3, aVF, V2, V3.  ____________________________________________  RADIOLOGY  CXR  IMPRESSION: Normal chest.  ____________________________________________   PROCEDURES  Procedure(s) performed: None  Critical Care performed: No  ____________________________________________   INITIAL IMPRESSION / ASSESSMENT AND PLAN / ED COURSE  Pertinent labs & imaging results that were available during my care of the patient were reviewed by me and considered in my medical  decision making (see chart for details).  John Hoover is a 50 y.o. male with history of Mnire's disease, no other chronic medical problems who presents for evaluation from his primary care doctor's office for cough and hypoxia today In the setting of 5 days of cough and myalgias. On arrival to the emergency Department his O2 saturation is 98% without oxygen. He has not required any oxygen since arrival to the ER and he has no tachypnea, no increased work of breathing and is in no respiratory distress. He has mild tachycardic however the remainder of his vital signs are stable, he is afebrile. We'll obtain screening labs, chest x-ray, flu swab, observe on cardiac monitor, reassess for disposition.  ----------------------------------------- 2:18 PM on  08/27/2015 ----------------------------------------- Patient reports significant symptomatic improvement at this time. Workup consistent with rhabdomyolysis. CK is 1150. Influenza negative. CBC is notable for mild leukocytosis, elevated hemoglobin level likely secondary to hemoconcentration. CMP with a creatinine elevation 1.71, potassium is elevated at 5.6 however no acute hyperkalemic EKG changes. We'll continue liberal IV fluids for rehydration and reassess. Chest x-ray clear. Case discussed with hospitalist, Dr. Imogene Burn, for admission. Troponin also mildly elevated at 0.25, question demand ischemia versus troponin leak in the setting of renal dysfunction. No chest pain, suspect NSTEMI is less likely. ASA ordered.  ____________________________________________   FINAL CLINICAL IMPRESSION(S) / ED DIAGNOSES  Final diagnoses:  Cough  Myalgia  Non-traumatic rhabdomyolysis  AKI (acute kidney injury) (HCC)  Hyperkalemia      Gayla Doss, MD 08/27/15 1420  Gayla Doss, MD 08/27/15 1435

## 2015-08-27 NOTE — H&P (Addendum)
[facility] [place] [facility]   PATIENT NAME: John SimsWilliam Hoover    MR#:  [id number]  DATE OF BIRTH:  01-Feb-1966  DATE OF ADMISSION:  08/27/2015  PRIMARY CARE PHYSICIAN: Crawford GivensGraham Duncan, MD   REQUESTING/REFERRING PHYSICIAN: Gayla DossEryka A Gayle, MD  CHIEF COMPLAINT:   Chief Complaint  Patient presents with  . Cough  Cough, shortness of breath, muscle aching and fever for 5 days.  HISTORY OF PRESENT ILLNESS:  John Hoover  is a 50 y.o. male with a known history of Mnire's disease. Patient to present to the ED from his permit care physician's office for cough, shortness breath and hypoxia today. Patient has had cough, myalgia, fever and chills for the past 5 days. And he complains of poor oral intake. His oxygen level in his office was 85% but is normal in the ED. He said his coworker has cold recently. His influenza test is negative in the ED. But his CK, troponin, potassium and creatinine are elevated.  PAST MEDICAL HISTORY:   Past Medical History  Diagnosis Date  . Meniere's disease     PAST SURGICAL HISTORY:   Past Surgical History  Procedure Laterality Date  . Nasal sinus surgery      SOCIAL HISTORY:   Social History  Substance Use Topics  . Smoking status: Never Smoker   . Smokeless tobacco: Never Used  . Alcohol Use: Yes     Comment: weendends    FAMILY HISTORY:   Family History  Problem Relation Age of Onset  . Hypertension Mother   . Hypertension Father   . Colon cancer Neg Hx   . Prostate cancer Neg Hx     DRUG ALLERGIES:   Allergies  Allergen Reactions  . Desloratadine     REACTION: doesn't work  . Fexofenadine     REACTION: doesn't work  . Loratadine     REACTION: does not work    REVIEW OF SYSTEMS:  CONSTITUTIONAL: Fever, chills, poor oral intake and generalized weakness.  EYES: No blurred or double vision.  EARS, NOSE, AND THROAT: No tinnitus or ear pain.  RESPIRATORY: Has cough, shortness of breath, no wheezing  or hemoptysis.  CARDIOVASCULAR: No chest pain, orthopnea, edema.  GASTROINTESTINAL: No nausea, vomiting, diarrhea or abdominal pain.  GENITOURINARY: No dysuria, hematuria.  ENDOCRINE: No polyuria, nocturia,  HEMATOLOGY: No anemia, easy bruising or bleeding SKIN: No rash or lesion. MUSCULOSKELETAL: No joint pain or arthritis. Muscle aching or over the body.  NEUROLOGIC: No tingling, numbness, weakness.  PSYCHIATRY: No anxiety or depression.   MEDICATIONS AT HOME:   Prior to Admission medications   Medication Sig Start Date End Date Taking? Authorizing Provider  fluocinonide ointment (LIDEX) 0.05 % Apply 1 application topically 2 (two) times daily.    Historical Provider, MD  Ibuprofen 200 MG CAPS Take by mouth as needed.      Historical Provider, MD      VITAL SIGNS:  Blood pressure 135/100, pulse 93, temperature 98.2 F (36.8 C), temperature source Oral, resp. rate 18, height 6\' 1"  (1.854 m), weight 86.183 kg (190 lb), SpO2 99 %.  PHYSICAL EXAMINATION:  GENERAL:  50 y.o.-year-old patient lying in the bed with no acute distress.  EYES: Pupils equal, round, reactive to light and accommodation. No scleral icterus. Extraocular muscles intact.  HEENT: Head atraumatic, normocephalic. Oropharynx and nasopharynx clear. Moist oral mucosa. NECK:  Supple, no jugular venous distention. No thyroid enlargement, no tenderness.  LUNGS: Normal breath sounds bilaterally, no wheezing, rales,rhonchi  or crepitation. No use of accessory muscles of respiration.  CARDIOVASCULAR: S1, S2 normal. No murmurs, rubs, or gallops.  ABDOMEN: Soft, nontender, nondistended. Bowel sounds present. No organomegaly or mass.  EXTREMITIES: No pedal edema, cyanosis, or clubbing.  NEUROLOGIC: Cranial nerves II through XII are intact. Muscle strength 5/5 in all extremities. Sensation intact. Gait not checked.  PSYCHIATRIC: The patient is alert and oriented x 3.  SKIN: No obvious rash, lesion, or ulcer.   LABORATORY PANEL:    CBC  Recent Labs Lab 08/27/15 1221  WBC 11.0*  HGB 19.7*  HCT 59.7*  PLT 125*   ------------------------------------------------------------------------------------------------------------------  Chemistries   Recent Labs Lab 08/27/15 1221  NA 132*  K 5.6*  CL 104  CO2 18*  GLUCOSE 123*  BUN 26*  CREATININE 1.71*  CALCIUM 8.3*  MG 2.0  AST 46*  ALT 20  ALKPHOS 39  BILITOT 0.7   ------------------------------------------------------------------------------------------------------------------  Cardiac Enzymes  Recent Labs Lab 08/27/15 1221  TROPONINI 0.25*   ------------------------------------------------------------------------------------------------------------------  RADIOLOGY:  Dg Chest 2 View  08/27/2015  CLINICAL DATA:  Cough and fever.  Low O2 saturation. EXAM: CHEST  2 VIEW COMPARISON:  None. FINDINGS: The heart size and mediastinal contours are within normal limits. Both lungs are clear. The visualized skeletal structures are unremarkable. IMPRESSION: Normal chest. Electronically Signed   By: Francene Boyers M.D.   On: 08/27/2015 12:19    EKG:  No orders found for this or any previous visit.  IMPRESSION AND PLAN:   Acute rhabdomyolysis Possible due to influenza. Influenza test is negative but I will repeat a PCR test. I will continue normal saline and a follow-up CK.  Elevated troponin. Possible due to rhabdomyolysis. The patient has no complaints of chest pain or palpitation. Follow-up troponin level.  Acute renal failure. Continue IV fluid support and follow-up BMP.  Hyperkalemia. Continue IV fluid and repeat potassium this p.m.  Hyponatremia. Continue normal saline IV and follow-up BMP.  Hyperlipidemia. He has hyperlipidemia but he is not on statin.  Thrombocytopenia. Unclear etilology. Follow-up CBC.  Influenza A. The patient has had flu symptoms for 5 days. No indication for starting tamiflu.  UTI. Follow up urine culture, start  rocephin.  All the records are reviewed and case discussed with ED provider. Management plans discussed with the patient, family and they are in agreement.  CODE STATUS: Full code  TOTAL TIME TAKING CARE OF THIS PATIENT: 52 minutes.    Shaune Pollack M.D on 08/27/2015 at 2:34 PM  Between 7am to 6pm - Pager - [phone]  After 6pm go to [email or web address] - password [email or web address] [facility]  [facility]  Office  [phone]  CC: Primary care physician; Crawford Givens, MD

## 2015-08-28 ENCOUNTER — Inpatient Hospital Stay (HOSPITAL_COMMUNITY)
Admit: 2015-08-28 | Discharge: 2015-08-28 | Disposition: A | Discharge status: Home / Self Care | Payer: BLUE CROSS/BLUE SHIELD | Attending: Cardiovascular Disease | Admitting: Cardiovascular Disease

## 2015-08-28 ENCOUNTER — Inpatient Hospital Stay: Payer: BLUE CROSS/BLUE SHIELD

## 2015-08-28 DIAGNOSIS — E872 Acidosis, unspecified: Secondary | ICD-10-CM | POA: Diagnosis present

## 2015-08-28 DIAGNOSIS — M6282 Rhabdomyolysis: Secondary | ICD-10-CM

## 2015-08-28 DIAGNOSIS — R579 Shock, unspecified: Secondary | ICD-10-CM

## 2015-08-28 DIAGNOSIS — N179 Acute kidney failure, unspecified: Secondary | ICD-10-CM

## 2015-08-28 DIAGNOSIS — R571 Hypovolemic shock: Secondary | ICD-10-CM

## 2015-08-28 DIAGNOSIS — R0600 Dyspnea, unspecified: Secondary | ICD-10-CM

## 2015-08-28 DIAGNOSIS — J9600 Acute respiratory failure, unspecified whether with hypoxia or hypercapnia: Secondary | ICD-10-CM

## 2015-08-28 LAB — BASIC METABOLIC PANEL
ANION GAP: 17 — AB (ref 5–15)
ANION GAP: 15 (ref 5–15)
Anion gap: 16 — ABNORMAL HIGH (ref 5–15)
BUN: 40 mg/dL — AB (ref 6–20)
BUN: 54 mg/dL — ABNORMAL HIGH (ref 6–20)
BUN: 60 mg/dL — ABNORMAL HIGH (ref 6–20)
CALCIUM: 7.9 mg/dL — AB (ref 8.9–10.3)
CALCIUM: 6.8 mg/dL — AB (ref 8.9–10.3)
CALCIUM: 6.9 mg/dL — AB (ref 8.9–10.3)
CO2: 14 mmol/L — AB (ref 22–32)
CO2: 13 mmol/L — ABNORMAL LOW (ref 22–32)
CO2: 15 mmol/L — ABNORMAL LOW (ref 22–32)
CREATININE: 2.22 mg/dL — AB (ref 0.61–1.24)
Chloride: 104 mmol/L (ref 101–111)
Chloride: 103 mmol/L (ref 101–111)
Chloride: 101 mmol/L (ref 101–111)
Creatinine, Ser: 3.01 mg/dL — ABNORMAL HIGH (ref 0.61–1.24)
Creatinine, Ser: 3 mg/dL — ABNORMAL HIGH (ref 0.61–1.24)
GFR calc Af Amer: 38 mL/min — ABNORMAL LOW (ref >60)
GFR, EST AFRICAN AMERICAN: 26 mL/min — AB (ref >60)
GFR, EST AFRICAN AMERICAN: 26 mL/min — AB (ref >60)
GFR, EST NON AFRICAN AMERICAN: 33 mL/min — AB (ref >60)
GFR, EST NON AFRICAN AMERICAN: 23 mL/min — AB (ref >60)
GFR, EST NON AFRICAN AMERICAN: 23 mL/min — AB (ref >60)
GLUCOSE: 172 mg/dL — AB (ref 65–99)
Glucose, Bld: 198 mg/dL — ABNORMAL HIGH (ref 65–99)
Glucose, Bld: 194 mg/dL — ABNORMAL HIGH (ref 65–99)
Potassium: 4.4 mmol/L (ref 3.5–5.1)
Potassium: 4.2 mmol/L (ref 3.5–5.1)
Potassium: 4.6 mmol/L (ref 3.5–5.1)
SODIUM: 133 mmol/L — AB (ref 135–145)
SODIUM: 131 mmol/L — AB (ref 135–145)
Sodium: 134 mmol/L — ABNORMAL LOW (ref 135–145)

## 2015-08-28 LAB — BLOOD GAS, ARTERIAL
ACID-BASE DEFICIT: 15.3 mmol/L — AB (ref 0.0–2.0)
ALLENS TEST (PASS/FAIL): POSITIVE — AB
Bicarbonate: 12 mEq/L — ABNORMAL LOW (ref 21.0–28.0)
FIO2: 44
FIO2: 0.7
Mechanical Rate: 24
O2 SAT: 99.9 %
PCO2 ART: 31 mmHg — AB (ref 32.0–48.0)
PCO2 ART: 33 mmHg (ref 32.0–48.0)
PEEP/CPAP: 5 cmH2O
PH ART: 7.17 — AB (ref 7.350–7.450)
Patient temperature: 37
Patient temperature: 37
VT: 600 mL
pH, Arterial: 6.81 — CL (ref 7.350–7.450)
pO2, Arterial: 147 mmHg — ABNORMAL HIGH (ref 83.0–108.0)
pO2, Arterial: 302 mmHg — ABNORMAL HIGH (ref 83.0–108.0)

## 2015-08-28 LAB — CBC
HCT: 64.6 % — ABNORMAL HIGH (ref 40.0–52.0)
Hemoglobin: 20.6 g/dL — ABNORMAL HIGH (ref 13.0–18.0)
MCH: 29.4 pg (ref 26.0–34.0)
MCHC: 31.9 g/dL — AB (ref 32.0–36.0)
MCV: 92.4 fL (ref 80.0–100.0)
Platelets: 77 10*3/uL — ABNORMAL LOW (ref 150–440)
RBC: 7 MIL/uL — ABNORMAL HIGH (ref 4.40–5.90)
RDW: 14.9 % — AB (ref 11.5–14.5)
WBC: 14.5 10*3/uL — ABNORMAL HIGH (ref 3.8–10.6)

## 2015-08-28 LAB — PROTIME-INR
INR: 1.84
Prothrombin Time: 21.2 seconds — ABNORMAL HIGH (ref 11.4–15.0)

## 2015-08-28 LAB — MRSA PCR SCREENING: MRSA BY PCR: NEGATIVE

## 2015-08-28 LAB — URINE DRUG SCREEN, QUALITATIVE (ARMC ONLY)
Amphetamines, Ur Screen: NOT DETECTED
BARBITURATES, UR SCREEN: NOT DETECTED
BENZODIAZEPINE, UR SCRN: NOT DETECTED
CANNABINOID 50 NG, UR ~~LOC~~: NOT DETECTED
Cocaine Metabolite,Ur ~~LOC~~: NOT DETECTED
MDMA (Ecstasy)Ur Screen: NOT DETECTED
Methadone Scn, Ur: NOT DETECTED
Opiate, Ur Screen: POSITIVE — AB
PHENCYCLIDINE (PCP) UR S: NOT DETECTED
Tricyclic, Ur Screen: NOT DETECTED

## 2015-08-28 LAB — HEPATIC FUNCTION PANEL
ALBUMIN: 1.8 g/dL — AB (ref 3.5–5.0)
ALK PHOS: 29 U/L — AB (ref 38–126)
ALT: 35 U/L (ref 17–63)
AST: 133 U/L — ABNORMAL HIGH (ref 15–41)
BILIRUBIN TOTAL: 0.4 mg/dL (ref 0.3–1.2)
Bilirubin, Direct: 0.2 mg/dL (ref 0.1–0.5)
Indirect Bilirubin: 0.2 mg/dL — ABNORMAL LOW (ref 0.3–0.9)
TOTAL PROTEIN: 3.7 g/dL — AB (ref 6.5–8.1)

## 2015-08-28 LAB — CK
CK TOTAL: 2831 U/L — AB (ref 49–397)
CK TOTAL: 7388 U/L — AB (ref 49–397)

## 2015-08-28 LAB — TROPONIN I
TROPONIN I: 0.33 ng/mL — AB (ref <0.031)
TROPONIN I: 0.41 ng/mL — AB (ref <0.031)
TROPONIN I: 0.44 ng/mL — AB (ref <0.031)

## 2015-08-28 LAB — SALICYLATE LEVEL: Salicylate Lvl: <4 mg/dL (ref 2.8–30.0)

## 2015-08-28 LAB — PHOSPHORUS: PHOSPHORUS: 11.4 mg/dL — AB (ref 2.5–4.6)

## 2015-08-28 LAB — ECHOCARDIOGRAM COMPLETE
HEIGHTINCHES: 73 in
WEIGHTICAEL: 3040 [oz_av]

## 2015-08-28 LAB — GLUCOSE, CAPILLARY: Glucose-Capillary: 73 mg/dL (ref 65–99)

## 2015-08-28 LAB — APTT: aPTT: 81 seconds — ABNORMAL HIGH (ref 24–36)

## 2015-08-28 LAB — LACTIC ACID, PLASMA
LACTIC ACID, VENOUS: 10.1 mmol/L — AB (ref 0.5–2.0)
LACTIC ACID, VENOUS: 5.4 mmol/L — AB (ref 0.5–2.0)

## 2015-08-28 LAB — LACTATE DEHYDROGENASE: LDH: 481 U/L — ABNORMAL HIGH (ref 98–192)

## 2015-08-28 LAB — MAGNESIUM: Magnesium: 2 mg/dL (ref 1.7–2.4)

## 2015-08-28 MED ORDER — FENTANYL CITRATE (PF) 100 MCG/2ML IJ SOLN
100.0000 ug | INTRAMUSCULAR | Status: AC | PRN
  Administered 2015-08-28 – 2015-08-29 (×3): 100 ug via INTRAVENOUS
  Filled 2015-08-28 (×4): qty 2

## 2015-08-28 MED ORDER — SODIUM CHLORIDE 0.9 % IV BOLUS (SEPSIS)
500.0000 mL | INTRAVENOUS | Status: DC | PRN
  Administered 2015-08-29 (×2): 500 mL via INTRAVENOUS
  Filled 2015-08-28 (×2): qty 500

## 2015-08-28 MED ORDER — OSELTAMIVIR PHOSPHATE 6 MG/ML PO SUSR
30.0000 mg | Freq: Every day | ORAL | Status: DC
  Administered 2015-08-29 – 2015-08-31 (×4): 30 mg
  Filled 2015-08-28 (×7): qty 5

## 2015-08-28 MED ORDER — SODIUM BICARBONATE 8.4 % IV SOLN
INTRAVENOUS | Status: AC
  Administered 2015-08-28: 50 meq via INTRAVENOUS
  Filled 2015-08-28: qty 50

## 2015-08-28 MED ORDER — MORPHINE SULFATE (PF) 2 MG/ML IV SOLN
2.0000 mg | Freq: Once | INTRAVENOUS | Status: AC
Start: 2015-08-28 — End: 2015-08-28
  Administered 2015-08-28: 2 mg via INTRAVENOUS
  Filled 2015-08-28: qty 1

## 2015-08-28 MED ORDER — LORAZEPAM 2 MG/ML IJ SOLN: 1.0000 mg | Freq: Four times a day (QID) | INTRAMUSCULAR | Status: DC | PRN

## 2015-08-28 MED ORDER — OSELTAMIVIR PHOSPHATE 6 MG/ML PO SUSR
30.0000 mg | Freq: Two times a day (BID) | ORAL | Status: DC
  Filled 2015-08-28: qty 5

## 2015-08-28 MED ORDER — MIDAZOLAM HCL 2 MG/2ML IJ SOLN
1.0000 mg | Freq: Once | INTRAMUSCULAR | Status: AC
  Administered 2015-08-28: 1 mg via INTRAVENOUS

## 2015-08-28 MED ORDER — FENTANYL 2500MCG IN NS 250ML (10MCG/ML) PREMIX INFUSION
0.0000 ug/h | INTRAVENOUS | Status: DC
Start: 2015-08-28 — End: 2015-08-28

## 2015-08-28 MED ORDER — NOREPINEPHRINE BITARTRATE 1 MG/ML IV SOLN
0.0000 ug/min | INTRAVENOUS | Status: DC
  Administered 2015-08-28: 10 ug/min via INTRAVENOUS
  Filled 2015-08-28: qty 4

## 2015-08-28 MED ORDER — SODIUM CHLORIDE 0.9% FLUSH: 10.0000 mL | INTRAVENOUS | Status: DC | PRN

## 2015-08-28 MED ORDER — DEXTROSE 5 % IV SOLN
2.0000 g | Freq: Two times a day (BID) | INTRAVENOUS | Status: DC
  Administered 2015-08-28 – 2015-09-03 (×12): 2 g via INTRAVENOUS
  Filled 2015-08-28 (×13): qty 2

## 2015-08-28 MED ORDER — PANTOPRAZOLE SODIUM 40 MG IV SOLR
40.0000 mg | Freq: Every day | INTRAVENOUS | Status: DC
  Administered 2015-08-28 – 2015-08-31 (×4): 40 mg via INTRAVENOUS
  Filled 2015-08-28 (×4): qty 40

## 2015-08-28 MED ORDER — SODIUM BICARBONATE 8.4 % IV SOLN
50.0000 meq | Freq: Once | INTRAVENOUS | Status: AC
Start: 2015-08-28 — End: 2015-08-28
  Administered 2015-08-28: 50 meq via INTRAVENOUS

## 2015-08-28 MED ORDER — SODIUM BICARBONATE 8.4 % IV SOLN
100.0000 meq | Freq: Once | INTRAVENOUS | Status: AC
  Administered 2015-08-28: 100 meq via INTRAVENOUS

## 2015-08-28 MED ORDER — METHYLPREDNISOLONE SODIUM SUCC 125 MG IJ SOLR
125.0000 mg | Freq: Once | INTRAMUSCULAR | Status: AC
  Administered 2015-08-28: 125 mg via INTRAVENOUS
  Filled 2015-08-28: qty 2

## 2015-08-28 MED ORDER — HYDROMORPHONE HCL 1 MG/ML IJ SOLN
1.0000 mg | INTRAMUSCULAR | Status: DC | PRN
  Administered 2015-08-28: 1 mg via INTRAVENOUS
  Filled 2015-08-28: qty 1

## 2015-08-28 MED ORDER — HYDROCORTISONE SOD SUCCINATE 100 MG PF FOR IT USE
50.0000 mg | Freq: Four times a day (QID) | INTRAMUSCULAR | Status: DC
  Filled 2015-08-28 (×4): qty 0.5

## 2015-08-28 MED ORDER — FENTANYL CITRATE (PF) 100 MCG/2ML IJ SOLN
100.0000 ug | Freq: Once | INTRAMUSCULAR | Status: AC
  Administered 2015-08-28: 100 ug via INTRAVENOUS

## 2015-08-28 MED ORDER — ROCURONIUM BROMIDE 50 MG/5ML IV SOLN
50.0000 mg | Freq: Once | INTRAVENOUS | Status: AC
  Administered 2015-08-28: 50 mg via INTRAVENOUS

## 2015-08-28 MED ORDER — VANCOMYCIN HCL 10 G IV SOLR
1250.0000 mg | INTRAVENOUS | Status: DC
  Filled 2015-08-28: qty 1250

## 2015-08-28 MED ORDER — MIDAZOLAM HCL 2 MG/2ML IJ SOLN
2.0000 mg | INTRAMUSCULAR | Status: AC | PRN
  Administered 2015-08-28 (×3): 2 mg via INTRAVENOUS
  Filled 2015-08-28 (×3): qty 2

## 2015-08-28 MED ORDER — VANCOMYCIN HCL IN DEXTROSE 1-5 GM/200ML-% IV SOLN
1000.0000 mg | INTRAVENOUS | Status: DC
  Administered 2015-08-29 (×2): 1000 mg via INTRAVENOUS
  Filled 2015-08-28 (×4): qty 200

## 2015-08-28 MED ORDER — PUREFLOW DIALYSIS SOLUTION
INTRAVENOUS | Status: DC
  Administered 2015-08-28 – 2015-08-29 (×3): 3 via INTRAVENOUS_CENTRAL
  Administered 2015-08-29: 10:00:00 via INTRAVENOUS_CENTRAL
  Administered 2015-08-29: 3 via INTRAVENOUS_CENTRAL
  Administered 2015-08-30: 500 via INTRAVENOUS_CENTRAL
  Administered 2015-08-30: 3 via INTRAVENOUS_CENTRAL
  Administered 2015-08-31: 03:00:00 via INTRAVENOUS_CENTRAL

## 2015-08-28 MED ORDER — HYDROCORTISONE NA SUCCINATE PF 100 MG IJ SOLR
50.0000 mg | Freq: Four times a day (QID) | INTRAMUSCULAR | Status: DC
  Administered 2015-08-28 – 2015-08-31 (×10): 50 mg via INTRAVENOUS
  Filled 2015-08-28 (×10): qty 2

## 2015-08-28 MED ORDER — HEPARIN SODIUM (PORCINE) 1000 UNIT/ML DIALYSIS
1000.0000 [IU] | INTRAMUSCULAR | Status: DC | PRN
  Administered 2015-08-30: 3600 [IU] via INTRAVENOUS_CENTRAL
  Administered 2015-09-02: 1800 [IU] via INTRAVENOUS_CENTRAL
  Administered 2015-09-05: 1000 [IU] via INTRAVENOUS_CENTRAL
  Filled 2015-08-28 (×4): qty 6
  Filled 2015-08-28: qty 1
  Filled 2015-08-28 (×3): qty 6

## 2015-08-28 MED ORDER — SODIUM CHLORIDE 0.9 % IV BOLUS (SEPSIS)
500.0000 mL | Freq: Once | INTRAVENOUS | Status: AC
  Administered 2015-08-28: 500 mL via INTRAVENOUS

## 2015-08-28 MED ORDER — MIDAZOLAM HCL 2 MG/2ML IJ SOLN: 2.0000 mg | INTRAMUSCULAR | Status: DC | PRN

## 2015-08-28 MED ORDER — HYDROMORPHONE HCL 1 MG/ML IJ SOLN
2.0000 mg | Freq: Once | INTRAMUSCULAR | Status: AC
Start: 2015-08-28 — End: 2015-08-28
  Administered 2015-08-28: 08:00:00 2 mg via INTRAVENOUS
  Filled 2015-08-28: qty 2

## 2015-08-28 MED ORDER — FENTANYL CITRATE (PF) 100 MCG/2ML IJ SOLN
100.0000 ug | INTRAMUSCULAR | Status: DC | PRN
  Administered 2015-08-28 – 2015-08-29 (×3): 100 ug via INTRAVENOUS
  Filled 2015-08-28 (×2): qty 2

## 2015-08-28 MED ORDER — SODIUM BICARBONATE 8.4 % IV SOLN
INTRAVENOUS | Status: DC
  Administered 2015-08-28 – 2015-09-01 (×6): via INTRAVENOUS
  Filled 2015-08-28 (×9): qty 150

## 2015-08-28 MED ORDER — FENTANYL 12 MCG/HR TD PT72
12.5000 ug | MEDICATED_PATCH | TRANSDERMAL | Status: DC
  Administered 2015-08-28: 12.5 ug via TRANSDERMAL
  Filled 2015-08-28: qty 1

## 2015-08-28 MED ORDER — MIDAZOLAM HCL 2 MG/2ML IJ SOLN
4.0000 mg | Freq: Once | INTRAMUSCULAR | Status: AC
  Administered 2015-08-28: 2 mg via INTRAVENOUS

## 2015-08-28 MED ORDER — MIDAZOLAM HCL 2 MG/2ML IJ SOLN
2.0000 mg | INTRAMUSCULAR | Status: DC | PRN
Start: 2015-08-28 — End: 2015-08-31
  Administered 2015-08-28 – 2015-08-29 (×4): 2 mg via INTRAVENOUS
  Filled 2015-08-28 (×5): qty 2

## 2015-08-28 MED ORDER — HYDROCODONE-ACETAMINOPHEN 5-325 MG PO TABS
1.0000 | ORAL_TABLET | ORAL | Status: DC | PRN
  Administered 2015-08-28: 1 via ORAL
  Filled 2015-08-28: qty 1

## 2015-08-28 MED ORDER — NOREPINEPHRINE BITARTRATE 1 MG/ML IV SOLN
0.0000 ug/min | INTRAVENOUS | Status: DC
  Administered 2015-08-28: 25 ug/min via INTRAVENOUS
  Administered 2015-08-29: 28 ug/min via INTRAVENOUS
  Administered 2015-08-29: 25 ug/min via INTRAVENOUS
  Administered 2015-08-30: 20.053 ug/min via INTRAVENOUS
  Filled 2015-08-28 (×5): qty 16

## 2015-08-28 NOTE — Significant Event (Signed)
Rapid Response Event Note  Overview:  Called to patient rm on floor for rapid response.    Initial Focused Assessment: Patient ST on telemetry monitoring, 6L n/c. Primary RN states earlier patient's tongue was swollen and his speech was slurred. Patient is restless, c/o of generalized pain, hypotensive 78/22. Receiving 500 ml NS bolus per Dr. Cherlynn KaiserSainani. Bladder scan had been done, approx 450 urine assessed. CPB 74. Dr. Cherlynn KaiserSainani at bedside, discussed patient status.  Interventions: Transfer to CCU 16 per MD order. MD order for head CT for AMS.   Event Summary: ABG obtained, pH 6.81, notified eLink MD ( Dr. Higinio Rogeramashaundra). Patient became less responsive, intubated by Dr. Park BreedSimond for respiratory protection. Dr. Carmon SailsMungual at bedside, placed right femoral arterial line and right femoral temporary VasCath.  Per MDs, 4 amp Na Bicarb administered, and Na Bicarb drip at 150 ml initiated. SBP 60s, Levophed initiated at 10 mcg. Dr. Luther RedoKollaru at bedside, MD order for CRRT, 2K bath. Dr. Cherlynn KaiserSainani at bedside, MD order for abdomen and pelvic CT. Transported to CT. Wife advised of all procedures and updated on patient status and POC.   at      at          [facility] Hospital[facility]rawford,Janvi Ammar

## 2015-08-28 NOTE — Procedures (Signed)
  Procedure Note: Femoral VasCath Placement Peterson AoWilliam E Buckalew , [id number] , [id number]  Indications: Hemodynamic monitoring / Intravenous access  Emergent placement due to severe metabolic and shock A time-out was completed verifying correct patient, procedure and site.  A trialysis catheter available at the time of procedure.  The patient was placed in a dependent position appropriate for central line placement based on the vein to be cannulated.   The patient's RIGHT Femoral Vein was prepped and draped in a sterile fashion.  A trialysis catheter was introduced into the RIGHT Femoral Vein using Seldinger technique, visualized under ultrasound.  The catheter was threaded smoothly over the guide wire and appropriate blood return was obtained.  Each lumen of the catheter was evacuated of air and flushed with sterile saline.  The catheter was then sutured in place to the skin and a sterile dressing applied.  Perfusion to the extremity distal to the point of catheter insertion was checked and found to be adequate.    The patient tolerated the procedure well and there were no complications.  Stephanie AcreVishal Skylin Kennerson, MD [place] [facility] Pager [phone] (please enter 7-digits) On Call Pager - [phone] (please enter 7-digits)

## 2015-08-28 NOTE — Consult Note (Signed)
PULMONARY / CRITICAL CARE MEDICINE   Name: John Hoover MRN: [id number] DOB: 1966-03-19    ADMISSION DATE:  08/27/2015 CONSULTATION DATE:  08/28/15  REFERRING MD: Cherlynn KaiserSainani  PT PROFILE: Young previously healthy [age] M admitted to gen med floor with several days of malaise and profound myalgias. Influenza PCR positive. On admission was noted to be hemoconcentrated with AKI and elevated CPK. On 03/28 developed progressive AMS and found to have profound metabolic acidosis. Transferred to ICU and PCCM consulted for management of acidosis, rhabdomyolysis, MODS, shock. Intubated, CVL placed and HD cath placed on arrival to ICU. Empiric abx initiated  MAJOR EVENTS/TEST RESULTS: 03/27 Admitted as above 03/28 transferred to ICU, intubated, CVL placed, HD cath placed, vasopressors initiated, Renal consult, CRRT initiated, HCO3 gtt initiated, empiric abx initated 03/28 CT head: NAD 03/28 Echocardiogram:  03/28 CTAP:    INDWELLING DEVICES:: ETT 03/28 >>  L [place] CVL 03/28 >> R femoral HD cath 03/28 >> R femoral A-line 03/28 >>  MICRO DATA: Flu PCR 03/27 >> positive for A, neg for H1N1 Urine 03/27 >>  Blood 03/28 >>  ANTIMICROBIALS:  Oseltamivir 03/27 >>  Vanc 03/28 >> Cefepime 03/28 >>  HISTORY OF PRESENT ILLNESS:   As above. In addition, pt's wife indicates that he does not use illicit drugs and there is no indication that he took an intentional OD of anything. He has been using large doses of ibuprofen for several days PTA  PAST MEDICAL HISTORY :  He  has a past medical history of Meniere's disease.  PAST SURGICAL HISTORY: He  has past surgical history that includes Nasal sinus surgery.  No Known Allergies  No current facility-administered medications on file prior to encounter.   No current outpatient prescriptions on file prior to encounter.    FAMILY HISTORY:  His indicated that his mother is alive. He indicated that his father is alive.   SOCIAL HISTORY: He  reports  that he has never smoked. He has never used smokeless tobacco. He reports that he drinks alcohol. He reports that he does not use illicit drugs.  REVIEW OF SYSTEMS:   Level 5 caveat  SUBJECTIVE:    VITAL SIGNS: BP 89/66 mmHg  Pulse 34  Temp(Src) 97.6 F (36.4 C) (Oral)  Resp 18  Ht 6\' 1"  (1.854 m)  Wt 86.183 kg (190 lb)  BMI 25.07 kg/m2  SpO2 94%  HEMODYNAMICS:    VENTILATOR SETTINGS: Vent Mode:  [-] PRVC FiO2 (%):  [100 %] 100 % Set Rate:  [24 bmp] 24 bmp Vt Set:  [600 mL] 600 mL PEEP:  [5 cmH20] 5 cmH20  INTAKE / OUTPUT: I/O last 3 completed shifts: In: 1587.9 [I.V.:1587.9] Out: -   PHYSICAL EXAMINATION: General: Comatose, WDWN, acrocyanotic Neuro: PERRL, EOMI, minimal withdrawal, DTRs symmetrically diminished HEENT: NCAT, WNL Cardiovascular: tachy, regular, no murmurs  Lungs: clear anteriorly without adventitious sounds Abdomen: soft, NT, ND, diminished BS Ext: cyanosis of feet and hands, cool, diminished distal pulses Skin: no lesions noted  LABS:  BMET  Recent Labs Lab 08/27/15 1221 08/27/15 1827 08/28/15 0539  NA 132* 132* 134*  K 5.6* 5.2* 4.4  CL 104 105 104  CO2 18* 14* 14*  BUN 26* 28* 40*  CREATININE 1.71* 1.56* 2.22*  GLUCOSE 123* 116* 172*    Electrolytes  Recent Labs Lab 08/27/15 1221 08/27/15 1827 08/28/15 0539  CALCIUM 8.3* 7.5* 7.9*  MG 2.0 1.8  --     CBC  Recent Labs Lab 08/27/15 1221 08/28/15 0539  WBC 11.0* 14.5*  HGB 19.7* 20.6*  HCT 59.7* 64.6*  PLT 125* 77*    Coag's No results for input(s): APTT, INR in the last 168 hours.  Sepsis Markers No results for input(s): LATICACIDVEN, PROCALCITON, O2SATVEN in the last 168 hours.  ABG  Recent Labs Lab 08/28/15 1530 08/28/15 1715  PHART 6.81* 7.02*  PCO2ART 31* 40  PO2ART 147* 447*    Liver Enzymes  Recent Labs Lab 08/27/15 1221  AST 46*  ALT 20  ALKPHOS 39  BILITOT 0.7  ALBUMIN 3.2*    Cardiac Enzymes  Recent Labs Lab 08/27/15 1221  08/27/15 1827 08/28/15 0007  TROPONINI 0.25* 0.35* 0.33*    Glucose  Recent Labs Lab 08/28/15 1528  GLUCAP 73    CXR: No acute cardiac or pulmonary abnormalities seen    ASSESSMENT / PLAN:  PULMONARY A: Acute resp failure - intubated due to severe acidosis and AMS P:   Vent settings established Vent bundle implemented Daily SBT as indicated  CARDIOVASCULAR A:  Shock - unclear etiology. Suspect septic but distal cyanosis suggests possible cardiac or hypovolemic P:  CVP goal 10-14 PRN NS boluses MAP goal > 65 mmHg Norepinephrine infusion Empiric hydrocortisone for possible adrenal insufficiency  RENAL A:   AKI - likely ATN + NSAIDS Severe metabolic acidosis P:   Monitor BMET intermittently Monitor I/Os Correct electrolytes as indicated CRRT per Renal service Monitor BMET and ABG frequently until stabilized HCO3 gtt initiated  GASTROINTESTINAL A:   Nonspecific bowel gas pattern on KUB R/O bowel ischemia P:   SUP: IV PPI CTAP ordered Consider TFs when hemodynamically stable  HEMATOLOGIC A:   Polycythemia - suspect hemoconcentrated Thrombocytopenia P:  DVT px: SCDs Monitor CBC intermittently Transfuse per usual guidelines  INFECTIOUS A:   Severe sepsis P:   Monitor temp, WBC count Micro and abx as above  ENDOCRINE A:   Stress induced hyperglycemia P:   CBG q 8 hrs Consider SSI for glu > 180  NEUROLOGIC A:   Acute encephalopathy ICU/vent associated discomfort P:   RASS goal: -1, -2 PAD protocol   FAMILY  - Updates: Wife updated in detail  CCM time: 60 mins The above time includes time spent in consultation with patient and/or family members and reviewing care plan on multidisciplinary rounds  Billy Fischer, MD PCCM service Mobile [phone] Pager [phone]    08/28/2015, 5:33 PM

## 2015-08-28 NOTE — Procedures (Signed)
PROCEDURE NOTE: L [place] CVL PLACEMENT  INDICATION:    Monitoring of central venous pressures and/or administration of medications optimally administered in central vein  CONSENT:   Risks of procedure as well as the alternatives were explained to the patient or surrogate. Consent for procedure obtained. A time out was performed.   PROCEDURE  Sterile technique was used including antiseptics, cap, gloves, gown, hand hygiene, mask and full body sheet.  Skin prep: Chlorhexidine; local anesthetic administered  A triple lumen catheter was placed in the L [place] vein using the Seldinger technique.   EVALUATION:  Blood flow good  Complications: No apparent complications  Patient tolerated the procedure well.  Chest X-ray verified proper placement and no PTX   Billy Fischeravid Erandy Mceachern, MD PCCM service Mobile [phone]  08/28/2015

## 2015-08-28 NOTE — Consult Note (Signed)
[facility]  CONSULT NOTE    Date: 08/28/2015                  Patient Name:  John Hoover  MRN: [id number]  DOB: 1965-11-27  Age / Sex: 50 y.o., male         PCP: Crawford Givens, MD                 Service Requesting Consult: Dr. Cherlynn Kaiser                 Reason for Consult: Acute renal Failure            History of Present Illness: Mr. John Hoover is a 50 y.o. white male with no specific past medical history, who was admitted to [facility] on 08/27/2015 for Cough [R05] Hyperkalemia [E87.5] Myalgia [M79.1] AKI (acute kidney injury) (HCC) [N17.9] Non-traumatic rhabdomyolysis [M62.82]   Patient went to his PCP yesterday with five days of cough, SOB, fever and myalgias. History taken from chart and from wife. Patient has been taking ibuprofen several times a day for the last five days.  He was found to be influenze positive. He was admitted last night. He was talking until this afternoon when a rapid response was called for acute respiratory failure. Patient was then transferred to ICU and intubated. PH of 6.81 so nephrology was consulted.    Medications: Outpatient medications: Prescriptions prior to admission  Medication Sig Dispense Refill Last Dose  . acetaminophen (TYLENOL) 325 MG tablet Take 650 mg by mouth every 6 (six) hours as needed.   prn at prn  . azithromycin (ZITHROMAX) 1 g powder Take 1 g by mouth once.     Marland Kitchen ibuprofen (ADVIL,MOTRIN) 200 MG tablet Take 200 mg by mouth every 6 (six) hours as needed.   prn at prn     Current medications: Current Facility-Administered Medications  Medication Dose Route Frequency Provider Last Rate Last Dose  . acetaminophen (TYLENOL) tablet 650 mg  650 mg Oral Q6H PRN Shaune Pollack, MD   650 mg at 08/27/15 1805   Or  . acetaminophen (TYLENOL) suppository 650 mg  650 mg Rectal Q6H PRN Shaune Pollack, MD      . albuterol (PROVENTIL) (2.5 MG/3ML) 0.083% nebulizer solution 2.5 mg  2.5 mg Nebulization Q2H PRN Shaune Pollack,  MD      . fentaNYL (SUBLIMAZE) injection 100 mcg  100 mcg Intravenous Q15 min PRN Merwyn Katos, MD      . fentaNYL (SUBLIMAZE) injection 100 mcg  100 mcg Intravenous Q2H PRN Merwyn Katos, MD      . hydrocortisone sodium succinate (SOLU-CORTEF) 100 MG injection 50 mg  50 mg Intravenous Q6H Houston Siren, MD      . midazolam (VERSED) injection 2 mg  2 mg Intravenous Q1H PRN Vishal Mungal, MD      . midazolam (VERSED) injection 2 mg  2 mg Intravenous Q15 min PRN Merwyn Katos, MD      . norepinephrine (LEVOPHED) 4 mg in dextrose 5 % 250 mL (0.016 mg/mL) infusion  0-40 mcg/min Intravenous Titrated Houston Siren, MD 56.3 mL/hr at 08/28/15 1700 15 mcg/min at 08/28/15 1700  . ondansetron (ZOFRAN) injection 4 mg  4 mg Intravenous Q6H PRN Shaune Pollack, MD   4 mg at 08/28/15 0502  . pantoprazole (PROTONIX) injection 40 mg  40 mg Intravenous Daily Merwyn Katos, MD      . sodium bicarbonate 150 mEq  in dextrose 5 % 1,000 mL infusion   Intravenous Continuous Merwyn Katosavid B Simonds, MD 150 mL/hr at 08/28/15 1642    . sodium chloride 0.9 % bolus 500 mL  500 mL Intravenous PRN Merwyn Katosavid B Simonds, MD          Allergies: No Known Allergies    Past Medical History: Past Medical History  Diagnosis Date  . Meniere's disease      Past Surgical History: Past Surgical History  Procedure Laterality Date  . Nasal sinus surgery       Family History: Family History  Problem Relation Age of Onset  . Hypertension Mother   . Hypertension Father   . Colon cancer Neg Hx   . Prostate cancer Neg Hx      Social History: Social History   Social History  . Marital Status: Married    Spouse Name: N/A  . Number of Children: N/A  . Years of Education: N/A   Occupational History  . Works in Product/process development scientistconcrete/ building    Social History Main Topics  . Smoking status: Never Smoker   . Smokeless tobacco: Never Used  . Alcohol Use: Yes     Comment: weendends  . Drug Use: No  . Sexual Activity: Not on file    Other Topics Concern  . Not on file   Social History Narrative   1 daughter (who has DM1) and 1 son   Married 25+ years     Review of Systems: Review of Systems  Unable to perform ROS: critical illness    Vital Signs: Blood pressure 89/66, pulse 34, temperature 97.6 F (36.4 C), temperature source Oral, resp. rate 18, height 6\' 1"  (1.854 m), weight 86.183 kg (190 lb), SpO2 94 %.  Weight trends: Filed Weights   08/27/15 1152  Weight: 86.183 kg (190 lb)    Physical Exam: General: Critically ill  Head: +ETT  Eyes: closed  Neck:  trachea midline, left subclavian triple lumen  Lungs:  Clear to auscultation  Heart: tachycarda  Abdomen:  Soft, nontender  Extremities: no peripheral edema.  Neurologic: Nonfocal, moving all four extremities  Skin: No lesions  Access: Right femoral temp HD catheter 3/28 Dr. Dema SeverinMungal     Lab results: Basic Metabolic Panel:  Recent Labs Lab 08/27/15 1221 08/27/15 1827 08/28/15 0539  NA 132* 132* 134*  K 5.6* 5.2* 4.4  CL 104 105 104  CO2 18* 14* 14*  GLUCOSE 123* 116* 172*  BUN 26* 28* 40*  CREATININE 1.71* 1.56* 2.22*  CALCIUM 8.3* 7.5* 7.9*  MG 2.0 1.8  --     Liver Function Tests:  Recent Labs Lab 08/27/15 1221  AST 46*  ALT 20  ALKPHOS 39  BILITOT 0.7  PROT 5.9*  ALBUMIN 3.2*   No results for input(s): LIPASE, AMYLASE in the last 168 hours. No results for input(s): AMMONIA in the last 168 hours.  CBC:  Recent Labs Lab 08/27/15 1221 08/28/15 0539  WBC 11.0* 14.5*  NEUTROABS 8.1*  --   HGB 19.7* 20.6*  HCT 59.7* 64.6*  MCV 90.1 92.4  PLT 125* 77*    Cardiac Enzymes:  Recent Labs Lab 08/27/15 1221 08/27/15 1827 08/28/15 0007 08/28/15 0539  CKTOTAL 1150*  --   --  2831*  TROPONINI 0.25* 0.35* 0.33*  --     BNP: Invalid input(s): POCBNP  CBG:  Recent Labs Lab 08/28/15 1528  GLUCAP 73    Microbiology: Results for orders placed or performed during the hospital encounter of  08/27/15   Rapid Influenza A&B Antigens ([facility] only)     Status: None   Collection Time: 08/27/15 12:22 PM  Result Value Ref Range Status   Influenza A ([facility]) NEGATIVE NEGATIVE Final   Influenza B ([facility]) NEGATIVE NEGATIVE Final    Coagulation Studies: No results for input(s): LABPROT, INR in the last 72 hours.  Urinalysis:  Recent Labs  08/27/15 1451  COLORURINE AMBER*  LABSPEC 1.027  PHURINE 5.0  GLUCOSEU NEGATIVE  HGBUR NEGATIVE  BILIRUBINUR NEGATIVE  KETONESUR NEGATIVE  PROTEINUR 30*  NITRITE NEGATIVE  LEUKOCYTESUR NEGATIVE      Imaging: Dg Chest 2 View  08/27/2015  CLINICAL DATA:  Cough and fever.  Low O2 saturation. EXAM: CHEST  2 VIEW COMPARISON:  None. FINDINGS: The heart size and mediastinal contours are within normal limits. Both lungs are clear. The visualized skeletal structures are unremarkable. IMPRESSION: Normal chest. Electronically Signed   By: Francene Boyers M.D.   On: 08/27/2015 12:19   Ct Head Wo Contrast  08/28/2015  CLINICAL DATA:  Acute onset of altered mental status. EXAM: CT HEAD WITHOUT CONTRAST TECHNIQUE: Contiguous axial images were obtained from the base of the skull through the vertex without intravenous contrast. COMPARISON:  None. FINDINGS: The ventricles are normal in size and configuration except for a slight dimpling or outpouching of the lateral ventricle on the right. 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 I do not see a definite closed or open lipped schizencephaly. No extra-axial fluid collections are identified. The gray-white differentiation is normal. No CT findings for acute intracranial process such as hemorrhage or infarction. No mass lesions. The brainstem and cerebellum are grossly normal. The bony structures are intact. The paranasal sinuses and mastoid air cells are clear. The globes are intact. IMPRESSION: No acute intracranial findings or mass lesion. 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 Electronically Signed   By: Rudie Meyer M.D.   On: 08/28/2015 16:15      Assessment & Plan: Mr. JIMEL MYLER is a 50 y.o. white male with no specific past medical history, who was admitted to [facility] on 08/27/2015  1. Acute Renal Failure 2. Metabolic Acidosis 3. Rhabdomyolysis 4. Acute respiratory failure 5. Influenza A positive  Plan Patient will need emergent hemodialysis. Currently hemodynamically unstable requiring vasopressors. Will start patient on CRRT tonight. Consent from wife.  CRRT 200 BFR DFR 25kg/mL/hr no UF.  q6 hours BMP Keep bicarb gtt for now at 49mL/hr     LOS: 1 Ashok Sawaya 3/28/20175:25 PM

## 2015-08-28 NOTE — Progress Notes (Signed)
Called to reevaluate patient as he was noted to be different.  Pt. Was complaining of his Tongue swelling and also noted to be hypotensive and hypoxic.    PE:  Gen - lethargic appearing male whose legs look mottled and he looks critically ill.  Heart - S1, S2, no murmurs, rubs, clicks Lungs - CTA b/l, no rales, rhonchi, wheezes Abd - soft, NT, ND, + BS, no organomegaly Ext - cold, clammy extremities, cyanotic appearing.  Neuro - AAO X 1, follows simple commands. Slight slurred speech.   ABG    Component Value Date/Time   PHART 6.81* 08/28/2015 1530   PCO2ART 31* 08/28/2015 1530   PO2ART 147* 08/28/2015 1530    Assessment & Plan  1. Metabolic Acidosis - etiology unclear. Suspected to be lactic acidosis. Will check lactate.  - cont. Aggressive IV fluids and follow hemodynamics.  - will check Urine Drug Screen.  - serial ABG's and monitor.   2. AMS/Enceophalopathy - due to metabolic encephalopathy from metabolic acidosis.  - now intubated and will monitor mental status when off vent & sedation.  - CT head (-).   3. Hypotension/Shock - ?? Anaphylaxis but source unclear.  ?? Sepsis.  - cont. Fluids, pressors and follow hemodynamics.  - check blood, urine cultures.  - will empirically add Vanco, Cefepime. Start IV Solucortef.   4. Acute renal failure - now with shock and metabolic acidosis.  - nephrology consult.   5. Thrombocytopenia w/ polycythemia - consult Hem/Onc. Etiology unclear. ?? Acute Leukemia.   Critical Care time Spent: 50 min.

## 2015-08-28 NOTE — Plan of Care (Signed)
Problem: Fluid Volume: Goal: Ability to maintain a balanced intake and output will improve Outcome: Progressing Ivf increased from 125 ml/hr to 150 ml/hr. Zofran given once due to vomiting with improvement. Pt taking in sips of ginger ale.

## 2015-08-28 NOTE — Progress Notes (Signed)
Update: Left subclavian CVL dislodged during transport to CT. L subclav CVL removed and a new one placed in the L sub clav.   CXR ordered   Stephanie AcreVishal Ambyr Qadri, MD [place] [facility] Pager [phone]- [phone] (please enter 7-digits) On Call Pager - [phone] (please enter 7-digits)

## 2015-08-28 NOTE — Progress Notes (Signed)
During patient's transport to CT, central line partially withdrawn. MD notified and when arrived back on unit, central line emergently rewired.

## 2015-08-28 NOTE — Progress Notes (Addendum)
Respomded to rapid response,patient alert,very cold extremities ,low bp,stat abg obtained.PH 6.81,notified Dr Prentice DockerSainini,transferred to ICU

## 2015-08-28 NOTE — Progress Notes (Signed)
Patient c/o shortness of breath - hypotensive, tongue swollen, slurred speech, cyanotic. Patient placed on 6L-O2. Rapid response called. Bo McclintockBrewer,Chenelle Benning S, RN

## 2015-08-28 NOTE — Procedures (Signed)
  [facility] Consultation    Procedure Note:  R Femoral Arterial Line Placement [name] , [id number] , [id number]  Indications:  Hemodynamic instability / recurrent ABG draws  Emergent placement due to shock   Time out was performed verifying correct patient, procedure, site, positioning, and special catheter was available at the time of procedure.  Patient's right femoral artery was prepped and draped in usual sterile fashion.  1 % Lidocaine WAS NOT used to anesthetize the area.  Total number of attempts were 2.  A 20 gauge arterial line was introduced into the RIGHT FEMORAL artery.  Catheter threaded and the needle was removed with appropriate blood return.  Blood loss was minimal.  Patient tolerated the procedure well, and there were no complications.     Stephanie AcreVishal Madylin Fairbank, MD [place] [facility] Pager [phone]- [phone] (please enter 7-digits) On Call Pager - [phone] (please enter 7-digits)

## 2015-08-28 NOTE — Progress Notes (Signed)
[facility] [place] [facility]   PATIENT NAME: John SimsWilliam Hoover    MR#:  [id number]  DATE OF BIRTH:  1966-05-18  SUBJECTIVE:   he is here due to flu and also noted to have acute rhabdomyolysis. Also noted to be in acute renal failure today and having significant body aches and appears very restless. Wife at bedside.   REVIEW OF SYSTEMS:    Review of Systems  Constitutional: Positive for chills. Negative for fever.  HENT: Negative for congestion and tinnitus.   Eyes: Negative for blurred vision and double vision.  Respiratory: Positive for shortness of breath. Negative for cough and wheezing.   Cardiovascular: Negative for chest pain, orthopnea and PND.  Gastrointestinal: Negative for nausea, vomiting, abdominal pain and diarrhea.  Genitourinary: Negative for dysuria and hematuria.  Musculoskeletal: Positive for myalgias.  Neurological: Negative for dizziness, sensory change and focal weakness.  All other systems reviewed and are negative.   Nutrition: Regular Tolerating Diet: yes Tolerating PT: Await Eval.    DRUG ALLERGIES:  No Known Allergies  VITALS:  Blood pressure 97/76, pulse 96, temperature 97.6 F (36.4 C), temperature source Oral, resp. rate 18, height 6\' 1"  (1.854 m), weight 86.183 kg (190 lb), SpO2 90 %.  PHYSICAL EXAMINATION:   Physical Exam  GENERAL:  50 y.o.-year-old patient lying in the bed with no acute distress.  EYES: Pupils equal, round, reactive to light and accommodation. No scleral icterus. Extraocular muscles intact.  HEENT: Head atraumatic, normocephalic. Oropharynx and nasopharynx clear.  NECK:  Supple, no jugular venous distention. No thyroid enlargement, no tenderness.  LUNGS: Poor Resp. effort, no wheezing, rales, rhonchi. No use of accessory muscles of respiration.  CARDIOVASCULAR: S1, S2 normal. No murmurs, rubs, or gallops.  ABDOMEN: Soft, nontender, nondistended. Bowel sounds present. No organomegaly or mass.   EXTREMITIES: No cyanosis, clubbing or edema b/l.    NEUROLOGIC: Cranial nerves II through XII are intact. No focal Motor or sensory deficits b/l.   PSYCHIATRIC: The patient is alert and oriented x 3. Anxious SKIN: No obvious rash, lesion, or ulcer.    LABORATORY PANEL:   CBC  Recent Labs Lab 08/28/15 0539  WBC 14.5*  HGB 20.6*  HCT 64.6*  PLT 77*   ------------------------------------------------------------------------------------------------------------------  Chemistries   Recent Labs Lab 08/27/15 1221 08/27/15 1827 08/28/15 0539  NA 132* 132* 134*  K 5.6* 5.2* 4.4  CL 104 105 104  CO2 18* 14* 14*  GLUCOSE 123* 116* 172*  BUN 26* 28* 40*  CREATININE 1.71* 1.56* 2.22*  CALCIUM 8.3* 7.5* 7.9*  MG 2.0 1.8  --   AST 46*  --   --   ALT 20  --   --   ALKPHOS 39  --   --   BILITOT 0.7  --   --    ------------------------------------------------------------------------------------------------------------------  Cardiac Enzymes  Recent Labs Lab 08/28/15 0007  TROPONINI 0.33*   ------------------------------------------------------------------------------------------------------------------  RADIOLOGY:  Dg Chest 2 View  08/27/2015  CLINICAL DATA:  Cough and fever.  Low O2 saturation. EXAM: CHEST  2 VIEW COMPARISON:  None. FINDINGS: The heart size and mediastinal contours are within normal limits. Both lungs are clear. The visualized skeletal structures are unremarkable. IMPRESSION: Normal chest. Electronically Signed   By: Francene BoyersJames  Maxwell M.D.   On: 08/27/2015 12:19     ASSESSMENT AND PLAN:   50 year old male with past medical history of Mnire's disease who presents to the hospital due to fever, chills, body aches and noted to have  influenza.  #1 influenza-the cause of patient's fever chills and body aches. -The supportive care with IV fluids, pain control, Tamiflu.  #2 acute rhabdomyolysis-etiology unclear. Nontraumatic. -Continue IV fluids, follow CKs  follow renal function.  #3 elevated troponin-secondary to the rhabdomyolysis. No evidence of acute coronary syndrome. -Cycle cardiac markers. Continue supportive care.  #4 body aches/myalgias-secondary to the viral illness. -Continue pain control with fentanyl patch, Vicodin, Dilaudid as needed.  #5 acute renal failure-secondary to the acute rhabdomyolysis/dehydration. -Continue aggressive IV fluids, follow BUN and creatinine and urine output. Renal dose meds avoid nephrotoxins.  #6 thrombocytopenia-I suspect this is secondary to the viral illness. -Follow platelet count. Hold heparin subcutaneous for now.   All the records are reviewed and case discussed with Care Management/Social Workerr. Management plans discussed with the patient, family and they are in agreement.  CODE STATUS: Full Code  DVT Prophylaxis: Ted's and SCD's  TOTAL TIME TAKING CARE OF THIS PATIENT: 30 minutes.   POSSIBLE D/C IN 1-2 DAYS, DEPENDING ON CLINICAL CONDITION.   Houston Siren M.D on 08/28/2015 at 2:29 PM  Between 7am to 6pm - Pager - [phone]  After 6pm go to [email or web address] - password [email or web address] [facility]  [facility]  Office  [phone]  CC: Primary care physician; Crawford Givens, MD

## 2015-08-28 NOTE — Progress Notes (Signed)
ANTIBIOTIC CONSULT NOTE - INITIAL  Pharmacy Consult for Vancomycin , Cefepime  Indication: sepsis  No Known Allergies  Patient Measurements: Height: 6\' 1"  (185.4 cm) Weight: 190 lb (86.183 kg) IBW/kg (Calculated) : 79.9 Adjusted Body Weight: 82.4 kg   Vital Signs: BP: 89/66 mmHg (03/28 1517) Pulse Rate: 34 (03/28 1517) Intake/Output from previous day: 03/27 0701 - 03/28 0700 In: 1587.9 [I.V.:1587.9] Out: -  Intake/Output from this shift: Total I/O In: 500 [IV Piggyback:500] Out: 600 [Urine:600]  Labs:  Recent Labs  08/27/15 1221 08/27/15 1827 08/28/15 0539  WBC 11.0*  --  14.5*  HGB 19.7*  --  20.6*  PLT 125*  --  77*  CREATININE 1.71* 1.56* 2.22*   Estimated Creatinine Clearance: 44.5 mL/min (by C-G formula based on Cr of 2.22). No results for input(s): VANCOTROUGH, VANCOPEAK, VANCORANDOM, GENTTROUGH, GENTPEAK, GENTRANDOM, TOBRATROUGH, TOBRAPEAK, TOBRARND, AMIKACINPEAK, AMIKACINTROU, AMIKACIN in the last 72 hours.   Microbiology: Recent Results (from the past 720 hour(s))  Rapid Influenza A&B Antigens ([facility] only)     Status: None   Collection Time: 08/27/15 12:22 PM  Result Value Ref Range Status   Influenza A ([facility]) NEGATIVE NEGATIVE Final   Influenza B [facility]) NEGATIVE NEGATIVE Final    Medical History: Past Medical History  Diagnosis Date  . Meniere's disease     Medications:  Scheduled:  . ceFEPime (MAXIPIME) IV  2 g Intravenous Q12H  . hydrocortisone sod succinate (SOLU-CORTEF) inj  50 mg Intravenous Q6H  . oseltamivir  30 mg Per Tube BID  . pantoprazole (PROTONIX) IV  40 mg Intravenous Daily  . vancomycin  1,000 mg Intravenous Q24H   Assessment: Pharmacy consulted to dose vancomycin and cefepime in this 50 year old male admitted with AKI, possible septic shock.  Pt is currently on CRRT.   Goal of Therapy:  Vancomycin trough :  15 - 25 mcg/mL  Plan:  Expected duration 7 days with resolution of temperature and/or normalization of WBC    Cefepime 2 gm IV Q12H.   Vancomycin 1 gm IV Q24H ordered to start 3/28 @ 20:00. Will draw 1st Vanc trough before 3rd dose scheduled for 3/30 @ 19:30.   Jobany Montellano D 08/28/2015,6:44 PM

## 2015-08-28 NOTE — Progress Notes (Signed)
MEDICATION RELATED CONSULT NOTE - INITIAL   Pharmacy Consult for CRRT Dosing   No Known Allergies  Patient Measurements: Height: 6\' 1"  (185.4 cm) Weight: 190 lb (86.183 kg) IBW/kg (Calculated) : 79.9  Vital Signs: BP: 89/66 mmHg (03/28 1517) Pulse Rate: 34 (03/28 1517) Intake/Output from previous day: 03/27 0701 - 03/28 0700 In: 1587.9 [I.V.:1587.9] Out: -  Intake/Output from this shift: Total I/O In: 500 [IV Piggyback:500] Out: 600 [Urine:600]  Labs:  Recent Labs  08/27/15 1221 08/27/15 1827 08/28/15 0539  WBC 11.0*  --  14.5*  HGB 19.7*  --  20.6*  HCT 59.7*  --  64.6*  PLT 125*  --  77*  CREATININE 1.71* 1.56* 2.22*  MG 2.0 1.8  --   ALBUMIN 3.2*  --   --   PROT 5.9*  --   --   AST 46*  --   --   ALT 20  --   --   ALKPHOS 39  --   --   BILITOT 0.7  --   --    Estimated Creatinine Clearance: 44.5 mL/min (by C-G formula based on Cr of 2.22).   Microbiology: Recent Results (from the past 720 hour(s))  Rapid Influenza A&B Antigens ([facility] only)     Status: None   Collection Time: 08/27/15 12:22 PM  Result Value Ref Range Status   Influenza A ([facility]) NEGATIVE NEGATIVE Final   Influenza B [facility]) NEGATIVE NEGATIVE Final    Medical History: Past Medical History  Diagnosis Date  . Meniere's disease     Medications:  Scheduled:  . ceFEPime (MAXIPIME) IV  2 g Intravenous Q12H  . hydrocortisone sod succinate (SOLU-CORTEF) inj  50 mg Intravenous Q6H  . oseltamivir  30 mg Per Tube Daily  . pantoprazole (PROTONIX) IV  40 mg Intravenous Daily  . vancomycin  1,000 mg Intravenous Q24H   Infusions:  . norepinephrine (LEVOPHED) Adult infusion 15 mcg/min (08/28/15 1700)  . pureflow    .  sodium bicarbonate  infusion 1000 mL 50 mL/hr at 08/28/15 1741    Assessment: Pharmacy consulted to assist in adjusting medications for CRRT in this 50 y/o M with rhabdomyolysis.   Plan:  Will adjust oseltamivir dosing to 30 mg daily. No further adjustments are necessary at  this time.  Pharmacy will continue to monitor and adjust per consult.   Luisa Harthristy, Merle Whitehorn D 08/28/2015,6:55 PM

## 2015-08-28 NOTE — Progress Notes (Signed)
Report called to Huntley DecSara in CCU. Patient taken to CT. Pam, CCU RN at patient bedside and to transfer.   Wife updated at bedside, arrived during rapid response. Bo McclintockBrewer,Sharea Guinther S, RN

## 2015-08-28 NOTE — Procedures (Signed)
Oral Intubation Procedure Note   Procedure: Intubation Indications: Respiratory insufficiency, failed extubation Consent: Unable to obtain consent because of altered level of consciousness. Time Out: Verified patient identification, verified procedure, site/side was marked, verified correct patient position, special equipment/implants available, medications/allergies/relevent history reviewed, required imaging and test results available.   Pre-meds: midaz, fentanyl  Neuromuscular blockade: Rocuronium 50 mg IV  Laryngoscope: #4 MAC  Visualization: cords partially visualized   ETT: 8.0 ETT passed on first attempt and secured @ 26 cm at upper incisors  Findings: normal airway   Evaluation:  + color change Bilateral BS CXR revealed proper position of ETT  Pt tolerated procedure well without complications   Billy Fischeravid Gwenneth Whiteman, MD ; [facility] service Mobile [phone].  After 5:30 PM or weekends, call [phone]

## 2015-08-28 NOTE — Progress Notes (Signed)
°   08/28/15 1800  Clinical Encounter Type  Visited With Family;Health care provider  Visit Type Initial  Referral From Nurse  Consult/Referral To Chaplain  Spiritual Encounters  Spiritual Needs Emotional  Stress Factors  Family Stress Factors Health changes  Pastoral care visit with family. Chaplain [employer] Group[employer]elyn Crews Ext [phone]

## 2015-08-28 NOTE — Progress Notes (Signed)
Patient emergently intubated after arrived in the ICU. Patient initially agitated but oriented and able to converse and follow commands. Mental status rapidly declined. E Link had been notified when patient arrived of patient's ABG and sodium bicarbonate administered per order. During intubation dose of versed changed to 2 mg from 4 mg by Dr. Sung AmabileSimonds.

## 2015-08-29 DIAGNOSIS — A419 Sepsis, unspecified organism: Secondary | ICD-10-CM

## 2015-08-29 DIAGNOSIS — R6521 Severe sepsis with septic shock: Secondary | ICD-10-CM

## 2015-08-29 LAB — BLOOD GAS, ARTERIAL
ACID-BASE DEFICIT: 13.1 mmol/L — AB (ref 0.0–2.0)
Acid-base deficit: 10.5 mmol/L — ABNORMAL HIGH (ref 0.0–2.0)
Bicarbonate: 13.6 mEq/L — ABNORMAL LOW (ref 21.0–28.0)
Bicarbonate: 15.8 mEq/L — ABNORMAL LOW (ref 21.0–28.0)
FIO2: 0.6
FIO2: 0.7
MECHVT: 600 mL
MECHVT: 600 mL
Mechanical Rate: 24
Mechanical Rate: 24
O2 Saturation: 99.8 %
O2 Saturation: 99.8 %
PATIENT TEMPERATURE: 37
PATIENT TEMPERATURE: 37
PCO2 ART: 34 mmHg (ref 32.0–48.0)
PCO2 ART: 36 mmHg (ref 32.0–48.0)
PEEP/CPAP: 5 cmH2O
PEEP: 5 cmH2O
PH ART: 7.21 — AB (ref 7.350–7.450)
PH ART: 7.25 — AB (ref 7.350–7.450)
pO2, Arterial: 242 mmHg — ABNORMAL HIGH (ref 83.0–108.0)
pO2, Arterial: 271 mmHg — ABNORMAL HIGH (ref 83.0–108.0)

## 2015-08-29 LAB — BASIC METABOLIC PANEL
ANION GAP: 10 (ref 5–15)
ANION GAP: 11 (ref 5–15)
ANION GAP: 8 (ref 5–15)
ANION GAP: 10 (ref 5–15)
ANION GAP: 12 (ref 5–15)
BUN: 39 mg/dL — ABNORMAL HIGH (ref 6–20)
BUN: 40 mg/dL — ABNORMAL HIGH (ref 6–20)
BUN: 42 mg/dL — ABNORMAL HIGH (ref 6–20)
BUN: 50 mg/dL — ABNORMAL HIGH (ref 6–20)
BUN: 55 mg/dL — ABNORMAL HIGH (ref 6–20)
CALCIUM: 7.7 mg/dL — AB (ref 8.9–10.3)
CALCIUM: 6.6 mg/dL — AB (ref 8.9–10.3)
CHLORIDE: 103 mmol/L (ref 101–111)
CHLORIDE: 104 mmol/L (ref 101–111)
CHLORIDE: 104 mmol/L (ref 101–111)
CHLORIDE: 102 mmol/L (ref 101–111)
CO2: 16 mmol/L — AB (ref 22–32)
CO2: 18 mmol/L — AB (ref 22–32)
CO2: 22 mmol/L (ref 22–32)
CO2: 17 mmol/L — AB (ref 22–32)
CO2: 20 mmol/L — AB (ref 22–32)
Calcium: 7 mg/dL — ABNORMAL LOW (ref 8.9–10.3)
Calcium: 7 mg/dL — ABNORMAL LOW (ref 8.9–10.3)
Calcium: 6.6 mg/dL — ABNORMAL LOW (ref 8.9–10.3)
Chloride: 102 mmol/L (ref 101–111)
Creatinine, Ser: 2.22 mg/dL — ABNORMAL HIGH (ref 0.61–1.24)
Creatinine, Ser: 2.25 mg/dL — ABNORMAL HIGH (ref 0.61–1.24)
Creatinine, Ser: 2.43 mg/dL — ABNORMAL HIGH (ref 0.61–1.24)
Creatinine, Ser: 2.48 mg/dL — ABNORMAL HIGH (ref 0.61–1.24)
Creatinine, Ser: 2.76 mg/dL — ABNORMAL HIGH (ref 0.61–1.24)
GFR calc Af Amer: 29 mL/min — ABNORMAL LOW (ref >60)
GFR calc Af Amer: 33 mL/min — ABNORMAL LOW (ref >60)
GFR calc non Af Amer: 25 mL/min — ABNORMAL LOW (ref >60)
GFR calc non Af Amer: 28 mL/min — ABNORMAL LOW (ref >60)
GFR calc non Af Amer: 29 mL/min — ABNORMAL LOW (ref >60)
GFR calc non Af Amer: 32 mL/min — ABNORMAL LOW (ref >60)
GFR calc non Af Amer: 33 mL/min — ABNORMAL LOW (ref >60)
GFR, EST AFRICAN AMERICAN: 34 mL/min — AB (ref >60)
GFR, EST AFRICAN AMERICAN: 38 mL/min — AB (ref >60)
GFR, EST AFRICAN AMERICAN: 37 mL/min — AB (ref >60)
GLUCOSE: 167 mg/dL — AB (ref 65–99)
GLUCOSE: 168 mg/dL — AB (ref 65–99)
GLUCOSE: 191 mg/dL — AB (ref 65–99)
GLUCOSE: 154 mg/dL — AB (ref 65–99)
GLUCOSE: 190 mg/dL — AB (ref 65–99)
POTASSIUM: 4 mmol/L (ref 3.5–5.1)
POTASSIUM: 4.6 mmol/L (ref 3.5–5.1)
POTASSIUM: 4.6 mmol/L (ref 3.5–5.1)
Potassium: 4.3 mmol/L (ref 3.5–5.1)
Potassium: 4.1 mmol/L (ref 3.5–5.1)
Sodium: 131 mmol/L — ABNORMAL LOW (ref 135–145)
Sodium: 131 mmol/L — ABNORMAL LOW (ref 135–145)
Sodium: 132 mmol/L — ABNORMAL LOW (ref 135–145)
Sodium: 132 mmol/L — ABNORMAL LOW (ref 135–145)
Sodium: 133 mmol/L — ABNORMAL LOW (ref 135–145)

## 2015-08-29 LAB — URINE CULTURE
Culture: NO GROWTH
SPECIAL REQUESTS: NORMAL

## 2015-08-29 LAB — CBC
HCT: 52.5 % — ABNORMAL HIGH (ref 40.0–52.0)
HEMOGLOBIN: 17 g/dL (ref 13.0–18.0)
MCH: 29.6 pg (ref 26.0–34.0)
MCHC: 32.4 g/dL (ref 32.0–36.0)
MCV: 91.6 fL (ref 80.0–100.0)
Platelets: 59 10*3/uL — ABNORMAL LOW (ref 150–440)
RBC: 5.73 MIL/uL (ref 4.40–5.90)
RDW: 48.1 % — ABNORMAL HIGH (ref 11.5–14.5)
WBC: 27.5 10*3/uL — AB (ref 3.8–10.6)

## 2015-08-29 LAB — GLUCOSE, CAPILLARY
GLUCOSE-CAPILLARY: 115 mg/dL — AB (ref 65–99)
GLUCOSE-CAPILLARY: 140 mg/dL — AB (ref 65–99)
Glucose-Capillary: 122 mg/dL — ABNORMAL HIGH (ref 65–99)
Glucose-Capillary: 40 mg/dL — CL (ref 65–99)

## 2015-08-29 LAB — TROPONIN I: Troponin I: 0.36 ng/mL — ABNORMAL HIGH

## 2015-08-29 LAB — CK
Total CK: 7210 U/L — ABNORMAL HIGH (ref 49–397)
Total CK: 5580 U/L — ABNORMAL HIGH (ref 49–397)

## 2015-08-29 LAB — MAGNESIUM: Magnesium: 1.8 mg/dL (ref 1.7–2.4)

## 2015-08-29 LAB — PHOSPHORUS: Phosphorus: 5.6 mg/dL — ABNORMAL HIGH (ref 2.5–4.6)

## 2015-08-29 MED ORDER — ANTISEPTIC ORAL RINSE SOLUTION (CORINZ)
7.0000 mL | Freq: Four times a day (QID) | OROMUCOSAL | Status: DC
  Administered 2015-08-29 – 2015-09-10 (×49): 7 mL via OROMUCOSAL
  Filled 2015-08-29 (×53): qty 7

## 2015-08-29 MED ORDER — INSULIN ASPART 100 UNIT/ML ~~LOC~~ SOLN
0.0000 [IU] | Freq: Three times a day (TID) | SUBCUTANEOUS | Status: DC
  Administered 2015-08-30: 2 [IU] via SUBCUTANEOUS
  Administered 2015-08-30: 3 [IU] via SUBCUTANEOUS
  Administered 2015-08-30 – 2015-08-31 (×2): 2 [IU] via SUBCUTANEOUS
  Filled 2015-08-29: qty 3
  Filled 2015-08-29 (×3): qty 2

## 2015-08-29 MED ORDER — SODIUM BICARBONATE 8.4 % IV SOLN
50.0000 meq | Freq: Once | INTRAVENOUS | Status: DC
Start: 2015-08-29 — End: 2015-08-30

## 2015-08-29 MED ORDER — CHLORHEXIDINE GLUCONATE 0.12% ORAL RINSE (MEDLINE KIT)
15.0000 mL | Freq: Two times a day (BID) | OROMUCOSAL | Status: DC
  Administered 2015-08-29 – 2015-09-10 (×26): 15 mL via OROMUCOSAL
  Filled 2015-08-29 (×27): qty 15

## 2015-08-29 MED ORDER — SODIUM CHLORIDE 0.9 % IV BOLUS (SEPSIS)
1000.0000 mL | Freq: Once | INTRAVENOUS | Status: AC
  Administered 2015-08-29: 1000 mL via INTRAVENOUS

## 2015-08-29 MED ORDER — PRO-STAT SUGAR FREE PO LIQD
30.0000 mL | Freq: Four times a day (QID) | ORAL | Status: DC
  Administered 2015-08-29 – 2015-09-07 (×36): 30 mL via ORAL

## 2015-08-29 MED ORDER — VITAL HIGH PROTEIN PO LIQD: 1000.0000 mL | ORAL | Status: DC

## 2015-08-29 MED ORDER — FENTANYL CITRATE (PF) 100 MCG/2ML IJ SOLN
50.0000 ug | INTRAMUSCULAR | Status: DC | PRN
  Administered 2015-08-29 – 2015-08-31 (×21): 50 ug via INTRAVENOUS
  Filled 2015-08-29 (×23): qty 2

## 2015-08-29 MED ORDER — MAGNESIUM SULFATE 2 GM/50ML IV SOLN
2.0000 g | Freq: Once | INTRAVENOUS | Status: AC
  Administered 2015-08-29: 2 g via INTRAVENOUS
  Filled 2015-08-29: qty 50

## 2015-08-29 MED ORDER — SODIUM CHLORIDE 0.9 % IV SOLN
0.0000 mg/h | INTRAVENOUS | Status: DC
  Administered 2015-08-29 (×2): 2 mg/h via INTRAVENOUS
  Administered 2015-08-30: 4 mg/h via INTRAVENOUS
  Administered 2015-08-30: 2 mg/h via INTRAVENOUS
  Administered 2015-08-31: 5 mg/h via INTRAVENOUS
  Filled 2015-08-29 (×5): qty 10

## 2015-08-29 MED ORDER — FREE WATER
100.0000 mL | Freq: Three times a day (TID) | Status: DC
  Administered 2015-08-29 – 2015-08-30 (×4): 100 mL

## 2015-08-29 MED ORDER — SODIUM BICARBONATE 8.4 % IV SOLN
INTRAVENOUS | Status: AC
  Administered 2015-08-29: 15:00:00
  Filled 2015-08-29: qty 50

## 2015-08-29 MED ORDER — VITAL 1.5 CAL PO LIQD
1000.0000 mL | ORAL | Status: DC
  Administered 2015-08-29 – 2015-09-05 (×8): 1000 mL

## 2015-08-29 NOTE — Clinical Documentation Improvement (Signed)
Internal Medicine Critical Care  Please clarify Sepsis diagnosis in progress notes and discharge summary   Sepsis POA  Sepsis not POA  Other  Clinically Undetermined  Document any associated diagnoses/conditions.   Supporting Information:  50 year old admitted 3/27 for Influenza A, acute renal failure  and rhabdomyolysis  Developed acute respiratory failure hypovolemic shock metabolic encephalopathy and lactic acidosis on 3/28 requiring intubation and mechanical ventilation, CRRT, ICU room, and vasopressors  3/27 admitting VS 122/90  114  20  Temp 98.4 Sats 98% Room air  3/28  Rapid response VS 78/22 HR 34  Sats 78% on Room air  Component     Latest Ref Rng 08/28/2015 08/28/2015         5:24 PM  9:33 PM  Lactic Acid, Venous     0.5 - 2.0 mmol/L 10.1 (HH) 5.4 (HH)   Component     Latest Ref Rng 08/27/2015 08/28/2015 08/29/2015  WBC     3.8 - 10.6 K/uL 11.0 (H) 14.5 (H) 27.5 (H)    3/28 Nurses note Patient c/o shortness of breath - hypotensive, tongue swollen, slurred speech, cyanotic. Patient placed on 6L-O2. Rapid response called.  3/28 progress note Called to reevaluate patient as he was noted to be different. Pt. Was complaining of his Tongue swelling and also noted to be hypotensive and hypoxic.  1. Metabolic Acidosis - etiology unclear. Suspected to be lactic acidosis. Will check lactate.  - cont. Aggressive IV fluids and follow hemodynamics.  - will check Urine Drug Screen.  - serial ABG's and monitor.   3/28 CCM consult PT PROFILE: John Hoover previously healthy [age] M admitted to gen med floor with several days of malaise and profound myalgias. Influenza PCR positive. On admission was noted to be hemoconcentrated with AKI and elevated CPK. On 03/28 developed progressive AMS and found to have profound metabolic acidosis. Transferred to ICU and PCCM consulted for management of acidosis, rhabdomyolysis, MODS, shock. Intubated, CVL placed and HD cath placed on arrival  to ICU. Empiric abx initiated 2. AMS/Enceophalopathy - due to metabolic encephalopathy from metabolic acidosis.  - now intubated and will monitor mental status when off vent & sedation.  - CT head (-).   3. Hypotension/Shock - ?? Anaphylaxis but source unclear. ?? Sepsis.  - cont. Fluids, pressors and follow hemodynamics.  - check blood, urine cultures.  - will empirically add Vanco, Cefepime. Start IV Solucortef.  Shock - unclear etiology. Suspect septic but distal cyanosis suggests possible cardiac or hypovolemic P:  CVP goal 10-14 PRN NS boluses MAP goal > 65 mmHg Norepinephrine infusion Empiric hydrocortisone for possible adrenal insufficiency INFECTIOUS A: Severe sepsis P: Monitor temp, WBC count Micro and abx as above  3/29 progress note VITALS:  Blood pressure 80/66, pulse 108, temperature 97.5 F (36.4 C), temperature source Axillary, resp. rate 22, height 6\' 1"  (1.854 m), weight 98.6 kg (217 lb 6 oz), SpO2 100 %.     3. Hypotension/Shock, Sepsis.  Continue levophed drip, f/u blood, urine cultures.  continue Vanco, Cefepime and IV Solucortef.   Treatments noted above.  Please exercise your independent, professional judgment when responding. A specific answer is not anticipated or expected.   Thank You,  Harless Littenebora T Reynard Christoffersen [facility] [place] [phone]

## 2015-08-29 NOTE — Progress Notes (Signed)
Initial Nutrition Assessment    INTERVENTION:   EN: received verbal order from MD Mungal to start TF; recommend starting Vital 1.5 at rate of 20 ml/hr with goal of 50 ml/hr with Prostat QID meeting 100% of estimated calorie and protein needs. Continue to assess   NUTRITION DIAGNOSIS:   Inadequate oral intake related to acute illness as evidenced by NPO status.  GOAL:   Provide needs based on ASPEN/SCCM guidelines  MONITOR:   TF tolerance, Vent status, Labs, I & O's, Weight trends  REASON FOR ASSESSMENT:   Ventilator    ASSESSMENT:    Pt admitted with acute rhabdomylosis and ARF with severe metabolic acidosis, shock currently on levophed at 22 mcg/min, respiratory failure currently on vent, currently on CRRT  Past Medical History  Diagnosis Date  . Meniere's disease     Diet Order:  Diet NPO time specified  Digestive System: OG in place [facility]  Skin:  Reviewed, no issues  Last BM:  08/26/15    Recent Labs Lab 08/27/15 1827  08/28/15 1721  08/28/15 2133 08/29/15 0034 08/29/15 0439 08/29/15 1032  NA 132*  < >  --   < > 131* 131* 132* 131*  K 5.2*  < >  --   < > 4.6 4.6 4.6 4.3  CL 105  < >  --   < > 101 102 102 104  CO2 14*  < >  --   < > 15* 17* 20* 16*  BUN 28*  < >  --   < > 60* 55* 50* 42*  CREATININE 1.56*  < >  --   < > 3.00* 2.76* 2.48* 2.43*  CALCIUM 7.5*  < >  --   < > 6.9* 7.0* 7.0* 7.7*  MG 1.8  --   --   --  2.0  --  1.8  --   PHOS  --   --  11.4*  --   --   --  5.6*  --   GLUCOSE 116*  < >  --   < > 194* 190* 154* 168*  < > = values in this interval not displayed.  Glucose Profile:   Recent Labs  08/28/15 1528 08/29/15 0729 08/29/15 0732  GLUCAP 73 <10* 122*   Meds: ss novolog, solumedrol, levophed, pureflow dialysate 2K-3Ca-1Mg   Height:   Ht Readings from Last 1 Encounters:  08/28/15 6\' 1"  (1.854 m)    Weight:   Wt Readings from Last 1 Encounters:  08/29/15 217 lb 6 oz (98.6 kg)    Filed Weights   08/27/15 1152  08/28/15 2200 08/29/15 0500  Weight: 190 lb (86.183 kg) 218 lb 7.6 oz (99.1 kg) 217 lb 6 oz (98.6 kg)    BMI:  Body mass index is 28.69 kg/(m^2).  Estimated Nutritional Needs:   Kcal:  2114 kcals (Ve: 14.7, Tmax: 36.7) using wt of 98.6 kg  Protein:  148-197 g (1.5-2.0 g/kg)   Fluid:  2L per day  EDUCATION NEEDS:   Education needs no appropriate at this time  Romelle Starcherate Katelynd Blauvelt MS, RD, LDN [phone] Pager  [phone] Weekend/On-Call Pager

## 2015-08-29 NOTE — Progress Notes (Signed)
PULMONARY / CRITICAL CARE MEDICINE   Name: John Hoover MRN: [id number] DOB: April 06, 1966    ADMISSION DATE:  08/27/2015  BRIEF HISTORY: [name] previously healthy 22 M admitted to gen med floor with several days of malaise and profound myalgias. Influenza PCR positive. On admission was noted to be hemoconcentrated with AKI and elevated CPK. On 03/28 developed progressive AMS and found to have profound metabolic acidosis. Transferred to ICU and PCCM consulted for management of acidosis, rhabdomyolysis, MODS, shock. Intubated, CVL placed and HD cath placed on arrival to ICU. Empiric abx initiated  SUBJECTIVE:  Started on CRRT lastnight, need 1L NS bolus, more awake this AM, but lethargic at times. Per RN notes, pt uncomfortable with ETT and foley.    STUDIES:  3/28 CT A/P>no acute findings, mildly dilated cecum (hypermobile mesentery) 3/28 ECHO > EF 60-65%, G1 Distolic dysfunction  SIGNIFICANT EVENTS: 3/27>cough/sob, influ A, mild rhabdo, admitted to [facility]  3/28>worsening respiratory status, tongue swelling, hypotensive, hypoxic>>shock, transferred to ICU, intubated, CVL, Aline, Vascath, rhabdo, CRRT started.   VITAL SIGNS: Temp:  [96.8 F (36 C)-98 F (36.7 C)] 97.5 F (36.4 C) (03/29 0600) Pulse Rate:  [34-112] 108 (03/29 0700) Resp:  [13-27] 22 (03/29 0700) BP: (73-106)/(46-75) 80/66 mmHg (03/29 0700) SpO2:  [78 %-100 %] 100 % (03/29 0700) Arterial Line BP: (85-127)/(46-70) 112/60 mmHg (03/29 0700) FiO2 (%):  [50 %-100 %] 50 % (03/29 0700) Weight:  [217 lb 6 oz (98.6 kg)-218 lb 7.6 oz (99.1 kg)] 217 lb 6 oz (98.6 kg) (03/29 0500) HEMODYNAMICS: CVP:  [7 mmHg-12 mmHg] 10 mmHg VENTILATOR SETTINGS: Vent Mode:  [-] PRVC FiO2 (%):  [50 %-100 %] 50 % Set Rate:  [24 bmp] 24 bmp Vt Set:  [600 mL] 600 mL PEEP:  [5 cmH20] 5 cmH20 INTAKE / OUTPUT:  Intake/Output Summary (Last 24 hours) at 08/29/15 0801 Last data filed at 08/29/15 0700  Gross per 24 hour  Intake 4529.48 ml  Output     711 ml  Net 3818.48 ml    Review of Systems  Unable to perform ROS: intubated    Physical Exam General:  Acrocyanotic, more awake this AM, lethargic at times Neuro: PERRL, EOMI, minimal withdrawal, DTRs symmetrically diminished HEENT: NCAT, WNL Cardiovascular: tachy, regular, no murmurs  Lungs: clear anteriorly without adventitious sounds Abdomen: soft, NT, ND, diminished BS Ext: cyanosis of feet and hands, cool, diminished distal pulses Skin: no lesions noted, mottling of skin  LABS:  CBC  Recent Labs Lab 08/27/15 1221 08/28/15 0539 08/29/15 0439  WBC 11.0* 14.5* 27.5*  HGB 19.7* 20.6* 17.0  HCT 59.7* 64.6* 52.5*  PLT 125* 77* 59*   Coag's  Recent Labs Lab 08/28/15 1724  APTT 81*  INR 1.84   BMET  Recent Labs Lab 08/28/15 2133 08/29/15 0034 08/29/15 0439  NA 131* 131* 132*  K 4.6 4.6 4.6  CL 101 102 102  CO2 15* 17* 20*  BUN 60* 55* 50*  CREATININE 3.00* 2.76* 2.48*  GLUCOSE 194* 190* 154*   Electrolytes  Recent Labs Lab 08/27/15 1827  08/28/15 1721  08/28/15 2133 08/29/15 0034 08/29/15 0439  CALCIUM 7.5*  < >  --   < > 6.9* 7.0* 7.0*  MG 1.8  --   --   --  2.0  --  1.8  PHOS  --   --  11.4*  --   --   --  5.6*  < > = values in this interval not displayed. Sepsis Markers  Recent Labs  Lab 08/28/15 1724 08/28/15 2133  LATICACIDVEN 10.1* 5.4*   ABG  Recent Labs Lab 08/28/15 2047 08/29/15 0006 08/29/15 0400  PHART 7.17* 7.21* 7.25*  PCO2ART 33 34 36  PO2ART 302* 271* 242*   Liver Enzymes  Recent Labs Lab 08/27/15 1221 08/28/15 1724  AST 46* 133*  ALT 20 35  ALKPHOS 39 29*  BILITOT 0.7 0.4  ALBUMIN 3.2* 1.8*   Cardiac Enzymes  Recent Labs Lab 08/28/15 1721 08/28/15 2133 08/29/15 0439  TROPONINI 0.44* 0.41* 0.36*   Glucose  Recent Labs Lab 08/28/15 1528 08/29/15 0729 08/29/15 0732  GLUCAP 73 <10* 122*    Imaging Ct Abdomen Pelvis Wo Contrast  08/28/2015  CLINICAL DATA:  50 year old male with history  of lactic acidosis. Several days of malaise and severe myalgias. Acute renal insufficiency. Altered mental status. Metabolic acidosis. Rhabdomyolysis. EXAM: CT ABDOMEN AND PELVIS WITHOUT CONTRAST TECHNIQUE: Multidetector CT imaging of the abdomen and pelvis was performed following the standard protocol without IV contrast. COMPARISON:  No priors. FINDINGS: Lower chest: Small bilateral pleural effusions lying dependently. Areas of dependent subsegmental atelectasis are noted in the lower lobes of the lungs bilaterally. Small amount of pericardial fluid and/or thickening. Hepatobiliary: No definite cystic or solid hepatic lesions are identified on today's noncontrast CT examination. Unenhanced appearance of the gallbladder is normal. Pancreas: No pancreatic mass or peripancreatic inflammatory changes on today's noncontrast CT examination. Spleen: Unremarkable. Adrenals/Urinary Tract: There are no abnormal calcifications within the collecting system of either kidney, along the course of either ureter, or within the lumen of the urinary bladder. No hydroureteronephrosis or perinephric stranding to suggest urinary tract obstruction at this time. The unenhanced appearance of the kidneys is unremarkable bilaterally. Urinary bladder is nearly completely decompressed, with a Foley balloon catheter in position and gas non dependently in the lumen (iatrogenic). Unenhanced appearance of the adrenal glands is normal. Stomach/Bowel: Normal appearance of the stomach. No pathologic dilatation of small bowel. There is some moderate gaseous distention of the cecum, which is in the epigastric region, measuring up to 12.2 cm in diameter. This appears to be incidental related to a hypermobile cecum, as there are no other imaging findings to suggest an acute findings such is a cecal volvulus at this time. Normal appendix. Vascular/Lymphatic: No significant atherosclerotic calcifications identified in the abdominal or pelvic vasculature.  There is a right common femoral central venous catheter with tip terminating in the inferior vena cava. There also appears to be a small right common femoral artery arterial line, extending into the right external iliac artery. Soft tissue stranding and poor definition ae in the fat planes between the tissues of the upper right thigh suggest resolving iatrogenic hemorrhage (no large hematomas identified). No lymphadenopathy noted in the abdomen or pelvis. Reproductive: Prostate gland and seminal vesicles are unremarkable in appearance. Other: Small volume of ascites predominantly in the pericolic gutters. No pneumoperitoneum. Musculoskeletal: There are no aggressive appearing lytic or blastic lesions noted in the visualized portions of the skeleton. IMPRESSION: 1. No acute findings in the abdomen or pelvis. 2. Mildly dilated cecum appears be incidental, and the unusual position of the cecum appears to be related to hypermobile mesentery. No evidence to suggest cecal volvulus at this time. 3. Normal appendix. 4. Small volume of ascites. 5. Small amount of pericardial fluid and/or thickening. 6. Small bilateral pleural effusions with some dependent passive atelectasis in the lower lobes of the lungs bilaterally. 7. Additional incidental findings, as above. Electronically Signed   By: Trudie Reedaniel  Entrikin  M.D.   On: 08/28/2015 19:39   Dg Abd 1 View  08/28/2015  CLINICAL DATA:  50 year old male status post nasogastric tube placement. EXAM: ABDOMEN - 1 VIEW COMPARISON:  No priors. FINDINGS: Tip of nasogastric tube is in the body of the stomach. Gas and stool are noted throughout the colon extending to the level of the distal rectum. There is a dilated loop of colon in the central abdomen, which is favored to represent a high position of the cecum, measuring up to 14 mm in diameter. No other dilated loops of small bowel or colon are noted. No gross evidence of pneumoperitoneum on this single supine view of the abdomen.  Central venous catheter from right femoral approach extending into the inferior vena cava. IMPRESSION: 1. Nasogastric tube is in the body of the stomach. 2. Unusual dilated loop of colon in the epigastric region, which is likely a dilated cecum. Clinical correlation for signs and symptoms of cecal volvulus is recommended. Further evaluation with CT of the abdomen and pelvis should be considered if clinically appropriate. These results will be called to the ordering clinician or representative by the Radiologist Assistant, and communication documented in the PACS or zVision Dashboard. Electronically Signed   By: Trudie Reed M.D.   On: 08/28/2015 18:26   Ct Head Wo Contrast  08/28/2015  CLINICAL DATA:  Acute onset of altered mental status. EXAM: CT HEAD WITHOUT CONTRAST TECHNIQUE: Contiguous axial images were obtained from the base of the skull through the vertex without intravenous contrast. COMPARISON:  None. FINDINGS: The ventricles are normal in size and configuration except for a slight dimpling or outpouching of the lateral ventricle on the right. 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 I do not see a definite closed or open lipped schizencephaly. No extra-axial fluid collections are identified. The gray-white differentiation is normal. No CT findings for acute intracranial process such as hemorrhage or infarction. No mass lesions. The brainstem and cerebellum are grossly normal. The bony structures are intact. The paranasal sinuses and mastoid air cells are clear. The globes are intact. IMPRESSION: No acute intracranial findings or mass lesion. 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 Electronically Signed   By: Rudie Meyer M.D.   On: 08/28/2015 16:15   Dg Chest Port 1 View  08/28/2015  CLINICAL DATA:  Central line placement. EXAM: PORTABLE CHEST 1 VIEW COMPARISON:  Earlier film, same date. FINDINGS: The left  subclavian central venous catheter has been advanced. The tip is in the mid SVC near its junction with the brachiocephalic vein. No complicating features. The heart and lungs are stable. IMPRESSION: Left subclavian central venous catheter tip is in the mid SVC. Electronically Signed   By: Rudie Meyer M.D.   On: 08/28/2015 19:57   Dg Chest Port 1 View  08/28/2015  CLINICAL DATA:  Central line placement. EXAM: PORTABLE CHEST 1 VIEW COMPARISON:  08/28/2015 at 1725 hours. FINDINGS: The endotracheal tube is 4.5 cm above the carina. The NG tube is coursing down the esophagus and into the stomach. The left subclavian center venous catheter tip has retracted and is now in the left brachiocephalic vein and should be advanced several cm (8 to 10 cm). No pleural effusion. IMPRESSION: The left subclavian central venous catheter tip is in the left brachiocephalic vein and could be advanced several cm. Electronically Signed   By: Rudie Meyer M.D.   On: 08/28/2015 19:11   Dg Chest [facility]  1 View  08/28/2015  CLINICAL DATA:  Intubated EXAM: PORTABLE CHEST 1 VIEW COMPARISON:  Chest radiograph from one day prior. FINDINGS: Endotracheal tube tip is 3.8 cm above the carina. Enteric tube terminates in the gastric fundus. Left subclavian central venous catheter terminates in the upper third of the superior vena cava. Stable cardiomediastinal silhouette with normal heart size. No pneumothorax. No pleural effusion. Lungs appear clear, with no acute consolidative airspace disease and no pulmonary edema. IMPRESSION: 1. Well-positioned support structures as described. 2. No pneumothorax.  No active cardiopulmonary disease. Electronically Signed   By: Delbert Phenix M.D.   On: 08/28/2015 18:02   INDWELLING DEVICES:: ETT 03/28 >>  L [place] CVL 03/28 >> R femoral HD cath 03/28 >> R femoral A-line 03/28 >>  MICRO DATA: Flu PCR 03/27 >> positive for A, neg for H1N1 Urine 03/27 >>  Blood 03/28 >> Trach aspirate  3/29>>  ANTIMICROBIALS:  Oseltamivir 03/27 >>  Vanc 03/28 >> Cefepime 03/28   DISCUSSION: 50 yo male admitted with URI Sx due to Influ A, decompensated into rhabdo, severe metabolic acidosis and shock requiring intubation, CRRT.   ASSESSMENT / PLAN: PULMONARY A: Acute resp failure - intubated due to severe acidosis and AMS P:  Vent settings established Maintain sats >90% Vent bundle implemented Daily SBT as indicated Acidosis improving, cont with current vent setting for another day.  ABG PRN Check trach aspirate  CARDIOVASCULAR A:  Shock - unclear etiology suspect septic, possibly due to Influ A P:  CVP goal 10-14, today is 10 PRN NS boluses MAP goal > 65 mmHg Norepinephrine infusion Empiric hydrocortisone for possible adrenal insufficiency Try to avoid additional pressor due to skin mottling of the extermities.   RENAL A:  AKI - likely ATN + NSAIDS Severe metabolic acidosis - improving on CRRT Rhabdomyolysis - CK peaked at 7388 P:  Monitor BMET intermittently Monitor I/Os Correct electrolytes as indicated UOP 150cc overnight.  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    FAMILY  - Updates: Wife updated in detail at bedside.    Thank you for consulting [facility], Please feel free to contacts [place] with any questions at [phone] (please enter 7-digits).  I have personally obtained a history, examined the patient, evaluated laboratory and imaging results, formulated the assessment and plan and placed orders.  The Patient requires high complexity decision making for assessment and support, frequent evaluation and titration of therapies, application of advanced monitoring technologies and extensive interpretation of multiple databases. Critical Care Time devoted to patient care services described in this note is 40 minutes.   Overall, patient is critically ill,  prognosis is guarded. Patient at high risk for cardiac arrest and death.    Stephanie Acre, MD [facility] Pager [phone] (please enter 7-digits) On Call Pager - [phone] (please enter 7-digits)  Note: This note was prepared with Dragon dictation along with smaller phrase technology. Any transcriptional errors that result from this process are unintentional.

## 2015-08-29 NOTE — Progress Notes (Signed)
Pt lethargic, arouses to voice, follows commands.  Pt increasingly agitated this morning about foley catheter and being on the vent.  Vitals stable, ST on cardiac monitor, spO2 100%, remains on levophed at 1516mcg/hr.  1.5L bolus given this morning for CVP<10.  125ml UOP for this shift.  CRRT without any complications during the night.  [facility] Charity fundraiserN.

## 2015-08-29 NOTE — Progress Notes (Signed)
[facility] - [place] at [facility]   PATIENT NAME: Alquan Morrish    MR#:  [id number]  DATE OF BIRTH:  May 10, 1966  SUBJECTIVE:   On vent and levophed drip. Oliguria, on CRRT. Wife at bedside.   REVIEW OF SYSTEMS:    Unable to obtain.  DRUG ALLERGIES:  No Known Allergies  VITALS:  Blood pressure 80/66, pulse 108, temperature 97.5 F (36.4 C), temperature source Axillary, resp. rate 22, height  (1.854 m), weight 98.6 kg (217 lb 6 oz), SpO2 100 %.  PHYSICAL EXAMINATION:   Physical Exam  GENERAL:  50 y.o.-year-old patient lying in the bed, on vent. EYES: Pupils equal, round, pinpoint. No scleral icterus.  HEENT: Head atraumatic, normocephalic. On intubation.  NECK:  Supple, no jugular venous distention. No thyroid enlargement, no tenderness.  LUNGS: Poor Resp. effort, no wheezing, rales, rhonchi. No use of accessory muscles of respiration.  CARDIOVASCULAR: S1, S2 normal. No murmurs, rubs, or gallops.  ABDOMEN: Soft, nontender, nondistended. Bowel sounds present. No organomegaly or mass.  EXTREMITIES: bilateral lower extremity positive cyanosis but has pedal pulses, no clubbing or edema.    NEUROLOGIC: unable to obtain. PSYCHIATRIC: on vent and sedation. SKIN: No obvious rash, lesion, or ulcer.    LABORATORY PANEL:   CBC  Recent Labs Lab 08/29/15 0439  WBC 27.5*  HGB 17.0  HCT 52.5*  PLT 59*   ------------------------------------------------------------------------------------------------------------------  Chemistries   Recent Labs Lab 08/28/15 1724  08/29/15 0439  NA 133*  < > 132*  K 4.2  < > 4.6  CL 103  < > 102  CO2 13*  < > 20*  GLUCOSE 198*  < > 154*  BUN 54*  < > 50*  CREATININE 3.01*  < > 2.48*  CALCIUM 6.8*  < > 7.0*  MG  --   < > 1.8  AST 133*  --   --   ALT 35  --   --   ALKPHOS 29*  --   --   BILITOT 0.4  --   --   < > = values in this interval not  displayed. ------------------------------------------------------------------------------------------------------------------  Cardiac Enzymes  Recent Labs Lab 08/29/15 0439  TROPONINI 0.36*   ------------------------------------------------------------------------------------------------------------------  RADIOLOGY:  Ct Abdomen Pelvis Wo Contrast  08/28/2015  CLINICAL DATA:  50 year old male with history of lactic acidosis. Several days of malaise and severe myalgias. Acute renal insufficiency. Altered mental status. Metabolic acidosis. Rhabdomyolysis. EXAM: CT ABDOMEN AND PELVIS WITHOUT CONTRAST TECHNIQUE: Multidetector CT imaging of the abdomen and pelvis was performed following the standard protocol without IV contrast. COMPARISON:  No priors. FINDINGS: Lower chest: Small bilateral pleural effusions lying dependently. Areas of dependent subsegmental atelectasis are noted in the lower lobes of the lungs bilaterally. Small amount of pericardial fluid and/or thickening. Hepatobiliary: No definite cystic or solid hepatic lesions are identified on today's noncontrast CT examination. Unenhanced appearance of the gallbladder is normal. Pancreas: No pancreatic mass or peripancreatic inflammatory changes on today's noncontrast CT examination. Spleen: Unremarkable. Adrenals/Urinary Tract: There are no abnormal calcifications within the collecting system of either kidney, along the course of either ureter, or within the lumen of the urinary bladder. No hydroureteronephrosis or perinephric stranding to suggest urinary tract obstruction at this time. The unenhanced appearance of the kidneys is unremarkable bilaterally. Urinary bladder is nearly completely decompressed, with a Foley balloon catheter in position and gas non dependently in the lumen (iatrogenic). Unenhanced appearance of the adrenal glands is normal. Stomach/Bowel: Normal appearance of the  stomach. No pathologic dilatation of small bowel. There  is some moderate gaseous distention of the cecum, which is in the epigastric region, measuring up to 12.2 cm in diameter. This appears to be incidental related to a hypermobile cecum, as there are no other imaging findings to suggest an acute findings such is a cecal volvulus at this time. Normal appendix. Vascular/Lymphatic: No significant atherosclerotic calcifications identified in the abdominal or pelvic vasculature. There is a right common femoral central venous catheter with tip terminating in the inferior vena cava. There also appears to be a small right common femoral artery arterial line, extending into the right external iliac artery. Soft tissue stranding and poor definition ae in the fat planes between the tissues of the upper right thigh suggest resolving iatrogenic hemorrhage (no large hematomas identified). No lymphadenopathy noted in the abdomen or pelvis. Reproductive: Prostate gland and seminal vesicles are unremarkable in appearance. Other: Small volume of ascites predominantly in the pericolic gutters. No pneumoperitoneum. Musculoskeletal: There are no aggressive appearing lytic or blastic lesions noted in the visualized portions of the skeleton. IMPRESSION: 1. No acute findings in the abdomen or pelvis. 2. Mildly dilated cecum appears be incidental, and the unusual position of the cecum appears to be related to hypermobile mesentery. No evidence to suggest cecal volvulus at this time. 3. Normal appendix. 4. Small volume of ascites. 5. Small amount of pericardial fluid and/or thickening. 6. Small bilateral pleural effusions with some dependent passive atelectasis in the lower lobes of the lungs bilaterally. 7. Additional incidental findings, as above. Electronically Signed   By: Trudie Reedaniel  Entrikin M.D.   On: 08/28/2015 19:39   Dg Chest 2 View  08/27/2015  CLINICAL DATA:  Cough and fever.  Low O2 saturation. EXAM: CHEST  2 VIEW COMPARISON:  None. FINDINGS: The heart size and mediastinal contours  are within normal limits. Both lungs are clear. The visualized skeletal structures are unremarkable. IMPRESSION: Normal chest. Electronically Signed   By: Francene BoyersJames  Maxwell M.D.   On: 08/27/2015 12:19   Dg Abd 1 View  08/28/2015  CLINICAL DATA:  50 year old male status post nasogastric tube placement. EXAM: ABDOMEN - 1 VIEW COMPARISON:  No priors. FINDINGS: Tip of nasogastric tube is in the body of the stomach. Gas and stool are noted throughout the colon extending to the level of the distal rectum. There is a dilated loop of colon in the central abdomen, which is favored to represent a high position of the cecum, measuring up to 14 mm in diameter. No other dilated loops of small bowel or colon are noted. No gross evidence of pneumoperitoneum on this single supine view of the abdomen. Central venous catheter from right femoral approach extending into the inferior vena cava. IMPRESSION: 1. Nasogastric tube is in the body of the stomach. 2. Unusual dilated loop of colon in the epigastric region, which is likely a dilated cecum. Clinical correlation for signs and symptoms of cecal volvulus is recommended. Further evaluation with CT of the abdomen and pelvis should be considered if clinically appropriate. These results will be called to the ordering clinician or representative by the Radiologist Assistant, and communication documented in the PACS or zVision Dashboard. Electronically Signed   By: Trudie Reedaniel  Entrikin M.D.   On: 08/28/2015 18:26   Ct Head Wo Contrast  08/28/2015  CLINICAL DATA:  Acute onset of altered mental status. EXAM: CT HEAD WITHOUT CONTRAST TECHNIQUE: Contiguous axial images were obtained from the base of the skull through the vertex without intravenous contrast.  COMPARISON:  None. FINDINGS: The ventricles are normal in size and configuration except for a slight dimpling or outpouching of the lateral ventricle on the right. 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 I do not see a definite closed  or open lipped schizencephaly. No extra-axial fluid collections are identified. The gray-white differentiation is normal. No CT findings for acute intracranial process such as hemorrhage or infarction. No mass lesions. The brainstem and cerebellum are grossly normal. The bony structures are intact. The paranasal sinuses and mastoid air cells are clear. The globes are intact. IMPRESSION: No acute intracranial findings or mass lesion. 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 Electronically Signed   By: Rudie Meyer M.D.   On: 08/28/2015 16:15   Dg Chest Port 1 View  08/28/2015  CLINICAL DATA:  Central line placement. EXAM: PORTABLE CHEST 1 VIEW COMPARISON:  Earlier film, same date. FINDINGS: The left subclavian central venous catheter has been advanced. The tip is in the mid SVC near its junction with the brachiocephalic vein. No complicating features. The heart and lungs are stable. IMPRESSION: Left subclavian central venous catheter tip is in the mid SVC. Electronically Signed   By: Rudie Meyer M.D.   On: 08/28/2015 19:57   Dg Chest Port 1 View  08/28/2015  CLINICAL DATA:  Central line placement. EXAM: PORTABLE CHEST 1 VIEW COMPARISON:  08/28/2015 at 1725 hours. FINDINGS: The endotracheal tube is 4.5 cm above the carina. The NG tube is coursing down the esophagus and into the stomach. The left subclavian center venous catheter tip has retracted and is now in the left brachiocephalic vein and should be advanced several cm (8 to 10 cm). No pleural effusion. IMPRESSION: The left subclavian central venous catheter tip is in the left brachiocephalic vein and could be advanced several cm. Electronically Signed   By: Rudie Meyer M.D.   On: 08/28/2015 19:11   Dg Chest Port 1 View  08/28/2015  CLINICAL DATA:  Intubated EXAM: PORTABLE CHEST 1 VIEW COMPARISON:  Chest radiograph from one day prior. FINDINGS: Endotracheal tube tip is 3.8  cm above the carina. Enteric tube terminates in the gastric fundus. Left subclavian central venous catheter terminates in the upper third of the superior vena cava. Stable cardiomediastinal silhouette with normal heart size. No pneumothorax. No pleural effusion. Lungs appear clear, with no acute consolidative airspace disease and no pulmonary edema. IMPRESSION: 1. Well-positioned support structures as described. 2. No pneumothorax.  No active cardiopulmonary disease. Electronically Signed   By: Delbert Phenix M.D.   On: 08/28/2015 18:02     ASSESSMENT AND PLAN:   50 year old male with past medical history of Mnire's disease who presents to the hospital due to fever, chills, body aches and noted to have influenza.  1. Acute respiratory failure. Continue ventilation, daily SBT as indicated per intensivist.  2. Metabolic Acidosis-actic acidosis. Better. On CRRT.   2. AMS/Enceophalopathy - due to metabolic encephalopathy from metabolic acidosis.  On sedation and vent.  3. Hypotension/Shock, Sepsis.  Continue levophed drip, f/u blood, urine cultures.  continue Vanco, Cefepime and IV Solucortef.   4. Acute renal failure - with shock and metabolic acidosis.  Continue CRRT per Dr. Wynelle Link.  5. Thrombocytopenia w/ polycythemia - consult Hem/Onc. Etiology unclear. ?? Acute Leukemia.   6.  influenza-the cause of patient's fever chills and body aches. -The supportive care with IV fluids and Tamiflu.  7.  acute rhabdomyolysis-etiology unclear. Nontraumatic. Was treated  with IV fluids, now on CRRT, follow CKs.  8.  elevated troponin-secondary to the rhabdomyolysis. No evidence of acute coronary syndrome.  Continue supportive care.  9.  Hyponatremia. F/u BMP.  I disscussed with Dr. Dema Severin and Dr. Wynelle Link. All the records are reviewed and case discussed with Care Management/Social Workerr. Management plans discussed with the patient's wife and they are in agreement.  CODE STATUS: Full  Code  DVT Prophylaxis: Ted's and SCD's  TOTAL CRITICAL TIME TAKING CARE OF THIS PATIENT: 48 minutes.   POSSIBLE D/C IN >3 DAYS, DEPENDING ON CLINICAL CONDITION.   Shaune Pollack M.D on 08/29/2015 at 8:39 AM  Between 7am to 6pm - Pager - [phone]  After 6pm go to [email or web address] - password [email or web address]  [facility]  Office  [phone]  CC: Primary care physician; Crawford Givens, MD

## 2015-08-29 NOTE — Progress Notes (Signed)
Pt sedated on versed gtt at 452ml/h and prn fentanyl, but still arouses to voice and follows commands.  Remains on levophed at 30 mcg.  2 L NS bolus given this shift to increase CVP and ABP, with goal being 12.  No changes to vent settings this shift.  Remains on CRRT with same settings, bmp checked q 6h.  Remains oliguric.  Family at bedside and updated on status and POC for patient.

## 2015-08-29 NOTE — Progress Notes (Signed)
[facility]  ROUNDING NOTE   Subjective:   Wife at bedside.  CRRT overnight Norepinephrine gtt 16 On bicarb gtt.   UOP 711  Objective:  Vital signs in last 24 hours:  Temp:  [96.8 F (36 C)-98 F (36.7 C)] 97.5 F (36.4 C) (03/29 0600) Pulse Rate:  [34-112] 108 (03/29 0700) Resp:  [13-27] 22 (03/29 0700) BP: (73-106)/(46-75) 80/66 mmHg (03/29 0700) SpO2:  [78 %-100 %] 100 % (03/29 0825) Arterial Line BP: (85-127)/(46-70) 112/60 mmHg (03/29 0700) FiO2 (%):  [40 %-100 %] 40 % (03/29 0825) Weight:  [98.6 kg (217 lb 6 oz)-99.1 kg (218 lb 7.6 oz)] 98.6 kg (217 lb 6 oz) (03/29 0500)  Weight change: 12.916 kg (28 lb 7.6 oz) Filed Weights   08/27/15 1152 08/28/15 2200 08/29/15 0500  Weight: 86.183 kg (190 lb) 99.1 kg (218 lb 7.6 oz) 98.6 kg (217 lb 6 oz)    Intake/Output: I/O last 3 completed shifts: In: 6117.4 [I.V.:4367.4; IV Piggyback:1750] Out: 711 [Urine:711]   Intake/Output this shift:     Physical Exam: General: Critically ill   Head: +ETT   Eyes: Eyes closed, PERRL  Neck:  trachea midline, left subclavian triple lumen  Lungs:  Clear to auscultation, PRVC FiO2 40%  Heart: Regular rate and rhythm  Abdomen:  Soft, nontender  Extremities: +cyanosis +mottling   Neurologic: Sedated, intubated.   Skin: No lesions  Access: Right femoral temp HD catheter 3/28 Dr. Stevenson Clinch    Basic Metabolic Panel:  Recent Labs Lab 08/27/15 1221 08/27/15 1827 08/28/15 0539 08/28/15 1721 08/28/15 1724 08/28/15 2133 08/29/15 0034 08/29/15 0439  NA 132* 132* 134*  --  133* 131* 131* 132*  K 5.6* 5.2* 4.4  --  4.2 4.6 4.6 4.6  CL 104 105 104  --  103 101 102 102  CO2 18* 14* 14*  --  13* 15* 17* 20*  GLUCOSE 123* 116* 172*  --  198* 194* 190* 154*  BUN 26* 28* 40*  --  54* 60* 55* 50*  CREATININE 1.71* 1.56* 2.22*  --  3.01* 3.00* 2.76* 2.48*  CALCIUM 8.3* 7.5* 7.9*  --  6.8* 6.9* 7.0* 7.0*  MG 2.0 1.8  --   --   --  2.0  --  1.8  PHOS  --   --   --  11.4*  --    --   --  5.6*    Liver Function Tests:  Recent Labs Lab 08/27/15 1221 08/28/15 1724  AST 46* 133*  ALT 20 35  ALKPHOS 39 29*  BILITOT 0.7 0.4  PROT 5.9* 3.7*  ALBUMIN 3.2* 1.8*   No results for input(s): LIPASE, AMYLASE in the last 168 hours. No results for input(s): AMMONIA in the last 168 hours.  CBC:  Recent Labs Lab 08/27/15 1221 08/28/15 0539 08/29/15 0439  WBC 11.0* 14.5* 27.5*  NEUTROABS 8.1*  --   --   HGB 19.7* 20.6* 17.0  HCT 59.7* 64.6* 52.5*  MCV 90.1 92.4 91.6  PLT 125* 77* 59*    Cardiac Enzymes:  Recent Labs Lab 08/27/15 1221 08/27/15 1827 08/28/15 0007 08/28/15 0539 08/28/15 1721 08/28/15 1724 08/28/15 2133 08/29/15 0439  CKTOTAL 1150*  --   --  2831*  --  7388*  --  7210*  TROPONINI 0.25* 0.35* 0.33*  --  0.44*  --  0.41* 0.36*    BNP: Invalid input(s): POCBNP  CBG:  Recent Labs Lab 08/28/15 1528 08/29/15 0729 08/29/15 0732  GLUCAP 73 <10*  19*    Microbiology: Results for orders placed or performed during the hospital encounter of 08/27/15  Rapid Influenza A&B Antigens ([facility] only)     Status: None   Collection Time: 08/27/15 12:22 PM  Result Value Ref Range Status   Influenza A ([place]) NEGATIVE NEGATIVE Final   Influenza B ([facility]) NEGATIVE NEGATIVE Final  MRSA PCR Screening     Status: None   Collection Time: 08/28/15  5:25 PM  Result Value Ref Range Status   MRSA by PCR NEGATIVE NEGATIVE Final    Comment:        The GeneXpert MRSA Assay (FDA approved for NASAL specimens only), is one component of a comprehensive MRSA colonization surveillance program. It is not intended to diagnose MRSA infection nor to guide or monitor treatment for MRSA infections.     Coagulation Studies:  Recent Labs  08/28/15 1724  LABPROT 21.2*  INR 1.84    Urinalysis:  Recent Labs  08/27/15 1451  COLORURINE AMBER*  LABSPEC 1.027  PHURINE 5.0  GLUCOSEU NEGATIVE  HGBUR NEGATIVE  BILIRUBINUR NEGATIVE  KETONESUR NEGATIVE   PROTEINUR 30*  NITRITE NEGATIVE  LEUKOCYTESUR NEGATIVE      Imaging: Ct Abdomen Pelvis Wo Contrast  08/28/2015  CLINICAL DATA:  50 year old male with history of lactic acidosis. Several days of malaise and severe myalgias. Acute renal insufficiency. Altered mental status. Metabolic acidosis. Rhabdomyolysis. EXAM: CT ABDOMEN AND PELVIS WITHOUT CONTRAST TECHNIQUE: Multidetector CT imaging of the abdomen and pelvis was performed following the standard protocol without IV contrast. COMPARISON:  No priors. FINDINGS: Lower chest: Small bilateral pleural effusions lying dependently. Areas of dependent subsegmental atelectasis are noted in the lower lobes of the lungs bilaterally. Small amount of pericardial fluid and/or thickening. Hepatobiliary: No definite cystic or solid hepatic lesions are identified on today's noncontrast CT examination. Unenhanced appearance of the gallbladder is normal. Pancreas: No pancreatic mass or peripancreatic inflammatory changes on today's noncontrast CT examination. Spleen: Unremarkable. Adrenals/Urinary Tract: There are no abnormal calcifications within the collecting system of either kidney, along the course of either ureter, or within the lumen of the urinary bladder. No hydroureteronephrosis or perinephric stranding to suggest urinary tract obstruction at this time. The unenhanced appearance of the kidneys is unremarkable bilaterally. Urinary bladder is nearly completely decompressed, with a Foley balloon catheter in position and gas non dependently in the lumen (iatrogenic). Unenhanced appearance of the adrenal glands is normal. Stomach/Bowel: Normal appearance of the stomach. No pathologic dilatation of small bowel. There is some moderate gaseous distention of the cecum, which is in the epigastric region, measuring up to 12.2 cm in diameter. This appears to be incidental related to a hypermobile cecum, as there are no other imaging findings to suggest an acute findings such  is a cecal volvulus at this time. Normal appendix. Vascular/Lymphatic: No significant atherosclerotic calcifications identified in the abdominal or pelvic vasculature. There is a right common femoral central venous catheter with tip terminating in the inferior vena cava. There also appears to be a small right common femoral artery arterial line, extending into the right external iliac artery. Soft tissue stranding and poor definition ae in the fat planes between the tissues of the upper right thigh suggest resolving iatrogenic hemorrhage (no large hematomas identified). No lymphadenopathy noted in the abdomen or pelvis. Reproductive: Prostate gland and seminal vesicles are unremarkable in appearance. Other: Small volume of ascites predominantly in the pericolic gutters. No pneumoperitoneum. Musculoskeletal: There are no aggressive appearing lytic or blastic lesions noted in the  visualized portions of the skeleton. IMPRESSION: 1. No acute findings in the abdomen or pelvis. 2. Mildly dilated cecum appears be incidental, and the unusual position of the cecum appears to be related to hypermobile mesentery. No evidence to suggest cecal volvulus at this time. 3. Normal appendix. 4. Small volume of ascites. 5. Small amount of pericardial fluid and/or thickening. 6. Small bilateral pleural effusions with some dependent passive atelectasis in the lower lobes of the lungs bilaterally. 7. Additional incidental findings, as above. Electronically Signed   By: Vinnie Langton M.D.   On: 08/28/2015 19:39   Dg Chest 2 View  08/27/2015  CLINICAL DATA:  Cough and fever.  Low O2 saturation. EXAM: CHEST  2 VIEW COMPARISON:  None. FINDINGS: The heart size and mediastinal contours are within normal limits. Both lungs are clear. The visualized skeletal structures are unremarkable. IMPRESSION: Normal chest. Electronically Signed   By: Lorriane Shire M.D.   On: 08/27/2015 12:19   Dg Abd 1 View  08/28/2015  CLINICAL DATA:  50 year old  male status post nasogastric tube placement. EXAM: ABDOMEN - 1 VIEW COMPARISON:  No priors. FINDINGS: Tip of nasogastric tube is in the body of the stomach. Gas and stool are noted throughout the colon extending to the level of the distal rectum. There is a dilated loop of colon in the central abdomen, which is favored to represent a high position of the cecum, measuring up to 14 mm in diameter. No other dilated loops of small bowel or colon are noted. No gross evidence of pneumoperitoneum on this single supine view of the abdomen. Central venous catheter from right femoral approach extending into the inferior vena cava. IMPRESSION: 1. Nasogastric tube is in the body of the stomach. 2. Unusual dilated loop of colon in the epigastric region, which is likely a dilated cecum. Clinical correlation for signs and symptoms of cecal volvulus is recommended. Further evaluation with CT of the abdomen and pelvis should be considered if clinically appropriate. These results will be called to the ordering clinician or representative by the Radiologist Assistant, and communication documented in the PACS or zVision Dashboard. Electronically Signed   By: Vinnie Langton M.D.   On: 08/28/2015 18:26   Ct Head Wo Contrast  08/28/2015  CLINICAL DATA:  Acute onset of altered mental status. EXAM: CT HEAD WITHOUT CONTRAST TECHNIQUE: Contiguous axial images were obtained from the base of the skull through the vertex without intravenous contrast. COMPARISON:  None. FINDINGS: The ventricles are normal in size and configuration except for a slight dimpling or outpouching of the lateral ventricle on the right. 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 I do not see a definite closed or open lipped schizencephaly. No extra-axial fluid collections are identified. The gray-white differentiation is normal. No CT findings for acute intracranial process such as hemorrhage or infarction. No mass lesions. The brainstem and cerebellum are  grossly normal. The bony structures are intact. The paranasal sinuses and mastoid air cells are clear. The globes are intact. IMPRESSION: No acute intracranial findings or mass lesion. 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 Electronically Signed   By: Marijo Sanes M.D.   On: 08/28/2015 16:15   Dg Chest Port 1 View  08/28/2015  CLINICAL DATA:  Central line placement. EXAM: PORTABLE CHEST 1 VIEW COMPARISON:  Earlier film, same date. FINDINGS: The left subclavian central venous catheter has been advanced. The tip is in the mid SVC near its  junction with the brachiocephalic vein. No complicating features. The heart and lungs are stable. IMPRESSION: Left subclavian central venous catheter tip is in the mid SVC. Electronically Signed   By: Marijo Sanes M.D.   On: 08/28/2015 19:57   Dg Chest Port 1 View  08/28/2015  CLINICAL DATA:  Central line placement. EXAM: PORTABLE CHEST 1 VIEW COMPARISON:  08/28/2015 at 1725 hours. FINDINGS: The endotracheal tube is 4.5 cm above the carina. The NG tube is coursing down the esophagus and into the stomach. The left subclavian center venous catheter tip has retracted and is now in the left brachiocephalic vein and should be advanced several cm (8 to 10 cm). No pleural effusion. IMPRESSION: The left subclavian central venous catheter tip is in the left brachiocephalic vein and could be advanced several cm. Electronically Signed   By: Marijo Sanes M.D.   On: 08/28/2015 19:11   Dg Chest Port 1 View  08/28/2015  CLINICAL DATA:  Intubated EXAM: PORTABLE CHEST 1 VIEW COMPARISON:  Chest radiograph from one day prior. FINDINGS: Endotracheal tube tip is 3.8 cm above the carina. Enteric tube terminates in the gastric fundus. Left subclavian central venous catheter terminates in the upper third of the superior vena cava. Stable cardiomediastinal silhouette with normal heart size. No pneumothorax. No pleural  effusion. Lungs appear clear, with no acute consolidative airspace disease and no pulmonary edema. IMPRESSION: 1. Well-positioned support structures as described. 2. No pneumothorax.  No active cardiopulmonary disease. Electronically Signed   By: Ilona Sorrel M.D.   On: 08/28/2015 18:02     Medications:   . midazolam (VERSED) infusion 1 mg/hr (08/29/15 0600)  . norepinephrine (LEVOPHED) Adult infusion 16 mcg/min (08/29/15 0630)  . pureflow 3 each (08/29/15 0400)  .  sodium bicarbonate  infusion 1000 mL 50 mL/hr at 08/28/15 2000   . antiseptic oral rinse  7 mL Mouth Rinse QID  . ceFEPime (MAXIPIME) IV  2 g Intravenous Q12H  . chlorhexidine gluconate (SAGE KIT)  15 mL Mouth Rinse BID  . hydrocortisone sod succinate (SOLU-CORTEF) inj  50 mg Intravenous Q6H  . magnesium sulfate 1 - 4 g bolus IVPB  2 g Intravenous Once  . oseltamivir  30 mg Per Tube Daily  . pantoprazole (PROTONIX) IV  40 mg Intravenous Daily  . vancomycin  1,000 mg Intravenous Q24H   acetaminophen **OR** acetaminophen, albuterol, fentaNYL (SUBLIMAZE) injection, heparin, midazolam, [DISCONTINUED] ondansetron **OR** ondansetron (ZOFRAN) IV, sodium chloride, sodium chloride flush  Assessment/ Plan:  Mr. John Hoover is a 50 y.o. white male with no specific past medical history, who was admitted to [facility] on 08/27/2015  1. Acute Renal Failure 2. Metabolic Acidosis 3. Rhabdomyolysis 4. Acute respiratory failure 5. Influenza A positive  Plan Patient with oliguric urine output. Continue CRRT for another shift. Will continue to monitor electrolytes, renal function, urine output, and volume status.  Currently hemodynamically unstable requiring vasopressors.  q6 hours BMP Keep bicarb gtt for now at 48m/hr   LOS: 2[place] SRafter [place]3/29/20178:49 AM

## 2015-08-29 NOTE — Progress Notes (Addendum)
eLink Physician-Brief Progress Note Patient Name: John Hoover DOB: 1966/04/14 MRN: [id number]   Date of Service  08/29/2015  HPI/Events of Note  Multiple issues: 1. Hypotension - BP = 98/61 and 2. Versed 2 mg Q 2 hours now holding patient and Fentanyl IVP produces hypotension. CVP = 9 and patient is on a Norepinephrine IV infusion. LVEF = 60% - 65%.  eICU Interventions  Will order: 1. Versed IV infusion. Titrate to RASS = 0 to -1.  2. Bolus with 0.9 NaCl 1 liter IV over 1 hour now.      Intervention Category Major Interventions: Hypotension - evaluation and management Minor Interventions: Agitation / anxiety - evaluation and management  Tomoki Lucken [place] 08/29/2015, 2:53 AM

## 2015-08-29 NOTE — Care Management (Signed)
Patient presented from PCP office with shortness of breath and reported room air sats of 85%.  Upon arrival to ED sats were 95 %.  CK found to be greater than 7000.  He was admitted to 1C but transferred to icu after decline in status.  He is now intubated and on crrt.

## 2015-08-29 NOTE — Progress Notes (Signed)
Chaplain provided a compassionate presence and spiritual support to the wife. Introduced wife to Sun MicrosystemsChaplain [name] upon exit. Jefm PettyChaplain Ernesta Trabert [phone]

## 2015-08-30 ENCOUNTER — Inpatient Hospital Stay: Payer: BLUE CROSS/BLUE SHIELD

## 2015-08-30 DIAGNOSIS — E875 Hyperkalemia: Secondary | ICD-10-CM

## 2015-08-30 LAB — PHOSPHORUS: PHOSPHORUS: 3.3 mg/dL (ref 2.5–4.6)

## 2015-08-30 LAB — CBC WITH DIFFERENTIAL/PLATELET
Band Neutrophils: 15 %
Basophils Absolute: 0 10*3/uL (ref 0–0.1)
Basophils Relative: 0 %
Blasts: 0 %
EOS ABS: 0 10*3/uL (ref 0–0.7)
Eosinophils Relative: 0 %
HEMATOCRIT: 36 % — AB (ref 40.0–52.0)
Hemoglobin: 11.9 g/dL — ABNORMAL LOW (ref 13.0–18.0)
LYMPHS ABS: 1.2 10*3/uL (ref 1.0–3.6)
Lymphocytes Relative: 5 %
MCH: 29.5 pg (ref 26.0–34.0)
MCHC: 33.1 g/dL (ref 32.0–36.0)
MCV: 89.3 fL (ref 80.0–100.0)
METAMYELOCYTES PCT: 0 %
MYELOCYTES: 0 %
Monocytes Absolute: 0.5 10*3/uL (ref 0.2–1.0)
Monocytes Relative: 2 %
NEUTROS PCT: 78 %
NRBC: 0 /100{WBCs}
Neutro Abs: 21.7 10*3/uL — ABNORMAL HIGH (ref 1.4–6.5)
Other: 0 %
PLATELETS: 28 10*3/uL — AB (ref 150–440)
PROMYELOCYTES ABS: 0 %
RBC: 4.03 MIL/uL — AB (ref 4.40–5.90)
RDW: 14.7 % — AB (ref 11.5–14.5)
WBC: 23.4 10*3/uL — AB (ref 3.8–10.6)

## 2015-08-30 LAB — GLUCOSE, CAPILLARY
GLUCOSE-CAPILLARY: 129 mg/dL — AB (ref 65–99)
Glucose-Capillary: 13 mg/dL — CL (ref 65–99)
Glucose-Capillary: 152 mg/dL — ABNORMAL HIGH (ref 65–99)
Glucose-Capillary: 146 mg/dL — ABNORMAL HIGH (ref 65–99)
Glucose-Capillary: 127 mg/dL — ABNORMAL HIGH (ref 65–99)

## 2015-08-30 LAB — COMP PANEL: LEUKEMIA/LYMPHOMA

## 2015-08-30 LAB — COMPREHENSIVE METABOLIC PANEL
ALK PHOS: 34 U/L — AB (ref 38–126)
ALT: 74 U/L — ABNORMAL HIGH (ref 17–63)
ALT: 87 U/L — AB (ref 17–63)
AST: 246 U/L — AB (ref 15–41)
AST: 296 U/L — AB (ref 15–41)
Albumin: 1.6 g/dL — ABNORMAL LOW (ref 3.5–5.0)
Albumin: 2 g/dL — ABNORMAL LOW (ref 3.5–5.0)
Alkaline Phosphatase: 37 U/L — ABNORMAL LOW (ref 38–126)
Anion gap: 7 (ref 5–15)
Anion gap: 8 (ref 5–15)
BILIRUBIN TOTAL: 0.7 mg/dL (ref 0.3–1.2)
BILIRUBIN TOTAL: 0.8 mg/dL (ref 0.3–1.2)
BUN: 46 mg/dL — AB (ref 6–20)
BUN: 51 mg/dL — AB (ref 6–20)
CALCIUM: 6.8 mg/dL — AB (ref 8.9–10.3)
CO2: 21 mmol/L — ABNORMAL LOW (ref 22–32)
CO2: 23 mmol/L (ref 22–32)
CREATININE: 2.33 mg/dL — AB (ref 0.61–1.24)
CREATININE: 2.52 mg/dL — AB (ref 0.61–1.24)
Calcium: 6.4 mg/dL — CL (ref 8.9–10.3)
Chloride: 104 mmol/L (ref 101–111)
Chloride: 101 mmol/L (ref 101–111)
GFR calc Af Amer: 36 mL/min — ABNORMAL LOW (ref >60)
GFR, EST AFRICAN AMERICAN: 32 mL/min — AB (ref >60)
GFR, EST NON AFRICAN AMERICAN: 31 mL/min — AB (ref >60)
GFR, EST NON AFRICAN AMERICAN: 28 mL/min — AB (ref >60)
Glucose, Bld: 177 mg/dL — ABNORMAL HIGH (ref 65–99)
Glucose, Bld: 139 mg/dL — ABNORMAL HIGH (ref 65–99)
POTASSIUM: 3.9 mmol/L (ref 3.5–5.1)
Potassium: 4 mmol/L (ref 3.5–5.1)
Sodium: 132 mmol/L — ABNORMAL LOW (ref 135–145)
Sodium: 132 mmol/L — ABNORMAL LOW (ref 135–145)
TOTAL PROTEIN: 3.4 g/dL — AB (ref 6.5–8.1)
TOTAL PROTEIN: 3.7 g/dL — AB (ref 6.5–8.1)

## 2015-08-30 LAB — BASIC METABOLIC PANEL
ANION GAP: 8 (ref 5–15)
ANION GAP: 7 (ref 5–15)
BUN: 45 mg/dL — ABNORMAL HIGH (ref 6–20)
BUN: 52 mg/dL — ABNORMAL HIGH (ref 6–20)
CHLORIDE: 105 mmol/L (ref 101–111)
CHLORIDE: 102 mmol/L (ref 101–111)
CO2: 21 mmol/L — AB (ref 22–32)
CO2: 24 mmol/L (ref 22–32)
Calcium: 6.6 mg/dL — ABNORMAL LOW (ref 8.9–10.3)
Calcium: 6.9 mg/dL — ABNORMAL LOW (ref 8.9–10.3)
Creatinine, Ser: 2.21 mg/dL — ABNORMAL HIGH (ref 0.61–1.24)
Creatinine, Ser: 2.29 mg/dL — ABNORMAL HIGH (ref 0.61–1.24)
GFR calc non Af Amer: 33 mL/min — ABNORMAL LOW (ref >60)
GFR calc non Af Amer: 31 mL/min — ABNORMAL LOW (ref >60)
GFR, EST AFRICAN AMERICAN: 38 mL/min — AB (ref >60)
GFR, EST AFRICAN AMERICAN: 36 mL/min — AB (ref >60)
GLUCOSE: 159 mg/dL — AB (ref 65–99)
Glucose, Bld: 174 mg/dL — ABNORMAL HIGH (ref 65–99)
POTASSIUM: 3.8 mmol/L (ref 3.5–5.1)
POTASSIUM: 3.6 mmol/L (ref 3.5–5.1)
SODIUM: 134 mmol/L — AB (ref 135–145)
Sodium: 133 mmol/L — ABNORMAL LOW (ref 135–145)

## 2015-08-30 LAB — FIBRIN DEGRADATION PROD.(ARMC ONLY): Fibrin Degradation Prod.: >10 — AB (ref <10)

## 2015-08-30 LAB — CBC
HEMATOCRIT: 41 % (ref 40.0–52.0)
HEMOGLOBIN: 13.4 g/dL (ref 13.0–18.0)
MCH: 29.9 pg (ref 26.0–34.0)
MCHC: 32.8 g/dL (ref 32.0–36.0)
MCV: 91.3 fL (ref 80.0–100.0)
Platelets: 41 10*3/uL — ABNORMAL LOW (ref 150–440)
RBC: 4.49 MIL/uL (ref 4.40–5.90)
RDW: 15 % — ABNORMAL HIGH (ref 11.5–14.5)
WBC: 24.3 10*3/uL — AB (ref 3.8–10.6)

## 2015-08-30 LAB — BLOOD GAS, ARTERIAL
ACID-BASE DEFICIT: 1.7 mmol/L (ref 0.0–2.0)
Bicarbonate: 21.5 mEq/L (ref 21.0–28.0)
FIO2: 0.3
MECHVT: 600 mL
O2 SAT: 92.8 %
PATIENT TEMPERATURE: 37
PCO2 ART: 31 mmHg — AB (ref 32.0–48.0)
PEEP: 5 cmH2O
PH ART: 7.45 (ref 7.350–7.450)
PO2 ART: 63 mmHg — AB (ref 83.0–108.0)
RATE: 24 resp/min

## 2015-08-30 LAB — CKMB (ARMC ONLY): CK, MB: 144.6 ng/mL — ABNORMAL HIGH (ref 0.5–5.0)

## 2015-08-30 LAB — TROPONIN I: Troponin I: 0.5 ng/mL — ABNORMAL HIGH (ref <0.031)

## 2015-08-30 LAB — CK
CK TOTAL: 10420 U/L — AB (ref 49–397)
Total CK: 14001 U/L — ABNORMAL HIGH (ref 49–397)

## 2015-08-30 LAB — OCCULT BLOOD X 1 CARD TO LAB, STOOL: Fecal Occult Bld: POSITIVE — AB

## 2015-08-30 LAB — MAGNESIUM: MAGNESIUM: 1.9 mg/dL (ref 1.7–2.4)

## 2015-08-30 LAB — VANCOMYCIN, TROUGH: Vancomycin Tr: 8 ug/mL — ABNORMAL LOW (ref 10–20)

## 2015-08-30 LAB — LACTATE DEHYDROGENASE: LDH: 668 U/L — AB (ref 98–192)

## 2015-08-30 LAB — LACTIC ACID, PLASMA: Lactic Acid, Venous: 5.3 mmol/L (ref 0.5–2.0)

## 2015-08-30 LAB — PROTIME-INR
INR: 1.21
Prothrombin Time: 15.5 seconds — ABNORMAL HIGH (ref 11.4–15.0)

## 2015-08-30 LAB — FIBRINOGEN: Fibrinogen: 286 mg/dL (ref 210–470)

## 2015-08-30 MED ORDER — ALBUMIN HUMAN 25 % IV SOLN
25.0000 g | Freq: Four times a day (QID) | INTRAVENOUS | Status: DC
  Administered 2015-08-30 – 2015-08-31 (×4): 25 g via INTRAVENOUS
  Filled 2015-08-30 (×7): qty 100

## 2015-08-30 MED ORDER — FENTANYL CITRATE (PF) 100 MCG/2ML IJ SOLN
100.0000 ug | Freq: Once | INTRAMUSCULAR | Status: AC
Start: 2015-08-30 — End: 2015-08-30
  Administered 2015-08-30: 100 ug via INTRAVENOUS

## 2015-08-30 MED ORDER — FREE WATER
30.0000 mL | Status: DC
  Administered 2015-08-30 – 2015-09-10 (×63): 30 mL

## 2015-08-30 MED ORDER — SENNOSIDES-DOCUSATE SODIUM 8.6-50 MG PO TABS: 1.0000 | ORAL_TABLET | Freq: Two times a day (BID) | ORAL | Status: DC

## 2015-08-30 NOTE — Progress Notes (Signed)
Troponin result of 0.5 reported to Dr Dema SeverinMungal.

## 2015-08-30 NOTE — Progress Notes (Signed)
Dr Dema SeverinMungal notified of platelet count 28.

## 2015-08-30 NOTE — Progress Notes (Signed)
Notified Dr Ronn MelenaKolloru of pt's CK increasing from 5580 to 10420 and albumin corrected calcium of 8.3.  Dr Ronn MelenaKolloru acknowledged, no new orders.

## 2015-08-30 NOTE — Progress Notes (Signed)
Pt sedated on versed with PRN fentanyl every hour. RAS score currently -2. Pt tolerating the vent well with a small amount of thin clear secretions. Heart rate is sinus tach with a rate of 105, BP of 113/65 and a MAP of 80. levophed running and is being titrated down by 2 as tolerated by the patient to keep around goal MAP of 65. Pt has generalized edema with occasional weeping of the skin. Albumin is currently infusing. Extremities are cold and mottled with audible pulses when using a doppler. Pt extremely oliguric and is currently on CRRT with a ultrafiltration rate of 3250ml/hr. Pt had three loose dark brown stools today that tested positive for occult blood.

## 2015-08-30 NOTE — Progress Notes (Signed)
Nutrition Follow-up     INTERVENTION:   EN: recommend continuing current TF regimen, continue to assess   NUTRITION DIAGNOSIS:   Inadequate oral intake related to acute illness as evidenced by NPO status.  GOAL:   Provide needs based on ASPEN/SCCM guidelines  MONITOR:   TF tolerance, Vent status, Labs, I & O's, Weight trends  REASON FOR ASSESSMENT:   Ventilator    ASSESSMENT:   Pt remains on CVVHD with no UF, anuric, levophed at 20 mcg/min, +influenza   Patient is currently intubated on ventilator support MV: 14.3 L/min (equation 14.7) Temp (24hrs), Avg:98.5 F (36.9 C), Min:98.1 F (36.7 C), Max:99.3 F (37.4 C)  Diet Order:  Diet NPO time specified   EN: tolerating Vital 1.5 at rate of 50 ml/hr  Digestive System: +large BM today, no signs of TF intolerance  Skin:  Reviewed, no issues  Last BM:  08/30/15 +large soft BM    Recent Labs Lab 08/28/15 1721  08/28/15 2133  08/29/15 0439  08/29/15 2309 08/30/15 0433 08/30/15 1043  NA  --   < > 131*  < > 132*  < > 133* 134* 132*  K  --   < > 4.6  < > 4.6  < > 4.0 3.8 3.9  CL  --   < > 101  < > 102  < > 103 105 104  CO2  --   < > 15*  < > 20*  < > 22 21* 21*  BUN  --   < > 60*  < > 50*  < > 40* 45* 46*  CREATININE  --   < > 3.00*  < > 2.48*  < > 2.25* 2.21* 2.33*  CALCIUM  --   < > 6.9*  < > 7.0*  < > 6.6* 6.6* 6.4*  MG  --   --  2.0  --  1.8  --   --  1.9  --   PHOS 11.4*  --   --   --  5.6*  --   --  3.3  --   GLUCOSE  --   < > 194*  < > 154*  < > 191* 174* 177*  < > = values in this interval not displayed.  Glucose Profile:   Recent Labs  08/30/15 0733 08/30/15 0735 08/30/15 1138  GLUCAP 13* 152* 146*   Meds: ss novolog, levophed, pureflow dialysate 2K-3Ca-1Mg , sodium bicarb at 50 ml/hr (204 kcals)  Height:   Ht Readings from Last 1 Encounters:  08/28/15 6\' 1"  (1.854 m)    Weight:   Wt Readings from Last 1 Encounters:  08/30/15 241 lb 10 oz (109.6 kg)    Filed Weights   08/28/15  2200 08/29/15 0500 08/30/15 0500  Weight: 218 lb 7.6 oz (99.1 kg) 217 lb 6 oz (98.6 kg) 241 lb 10 oz (109.6 kg)    BMI:  Body mass index is 31.89 kg/(m^2).  Estimated Nutritional Needs:   Kcal:  2114 kcals (Ve: 14.7, Tmax: 36.7) using wt of 98.6 kg, no changes needed today  Protein:  148-197 g (1.5-2.0 g/kg)   Fluid:  2L per day  EDUCATION NEEDS:   Education needs no appropriate at this time  Romelle StarcherCate Adrik Khim MS, RD, LDN [phone] Pager  [phone] Weekend/On-Call Pager

## 2015-08-30 NOTE — Progress Notes (Signed)
ANTIBIOTIC CONSULT NOTE - INITIAL  Pharmacy Consult for Vancomycin , Cefepime  Indication: sepsis  No Known Allergies  Patient Measurements: Height: _0  (185.4 cm) Weight: 241 lb 10 oz (109.6 kg) IBW/kg (Calculated) : 79.9 Adjusted Body Weight: 82.4 kg   Vital Signs: Temp: 99.3 F (37.4 C) (03/30 1200) Temp Source: Axillary (03/30 1200) BP: 89/71 mmHg (03/30 1000) Pulse Rate: 105 (03/30 1500) Intake/Output from previous day: 03/29 0701 - 03/30 0700 In: 5844.6 [I.V.:1809.8; NG/GT:684.8; IV Piggyback:3350] Out: 63 [Urine:63] Intake/Output from this shift: Total I/O In: 1198.6 [I.V.:478.4; NG/GT:570.2; IV Piggyback:150] Out: 41 [Urine:41]  Labs:  Recent Labs  08/28/15 0539  08/29/15 0439  08/29/15 2309 08/30/15 0433 08/30/15 1043  WBC 14.5*  --  27.5*  --   --  24.3*  --   HGB 20.6*  --  17.0  --   --  13.4  --   PLT 77*  --  59*  --   --  41*  --   CREATININE 2.22*  < > 2.48*  < > 2.25* 2.21* 2.33*  < > = values in this interval not displayed. Estimated Creatinine Clearance: 48.7 mL/min (by C-G formula based on Cr of 2.33). No results for input(s): VANCOTROUGH, VANCOPEAK, VANCORANDOM, GENTTROUGH, GENTPEAK, GENTRANDOM, TOBRATROUGH, TOBRAPEAK, TOBRARND, AMIKACINPEAK, AMIKACINTROU, AMIKACIN in the last 72 hours.   Microbiology: Recent Results (from the past 720 hour(s))  Rapid Influenza A&B Antigens ([place] only)     Status: None   Collection Time: 08/27/15 12:22 PM  Result Value Ref Range Status   Influenza A ([place]) NEGATIVE NEGATIVE Final   Influenza B ([facility]) NEGATIVE NEGATIVE Final  Urine culture     Status: None   Collection Time: 08/27/15  7:40 PM  Result Value Ref Range Status   Specimen Description URINE, RANDOM  Final   Special Requests Normal  Final   Culture NO GROWTH 1 DAY  Final   Report Status 08/29/2015 FINAL  Final  MRSA PCR Screening     Status: None   Collection Time: 08/28/15  5:25 PM  Result Value Ref Range Status   MRSA by PCR NEGATIVE  NEGATIVE Final    Comment:        The GeneXpert MRSA Assay (FDA approved for NASAL specimens only), is one component of a comprehensive MRSA colonization surveillance program. It is not intended to diagnose MRSA infection nor to guide or monitor treatment for MRSA infections.   Culture, blood (Routine X 2) w Reflex to ID Panel     Status: None (Preliminary result)   Collection Time: 08/28/15  5:51 PM  Result Value Ref Range Status   Specimen Description BLOOD RIGHT HAND  Final   Special Requests BAA,ANA,AER,5ML  Final   Culture NO GROWTH 2 DAYS  Final   Report Status PENDING  Incomplete  Culture, blood (Routine X 2) w Reflex to ID Panel     Status: None (Preliminary result)   Collection Time: 08/28/15  5:51 PM  Result Value Ref Range Status   Specimen Description BLOOD RIGHT ASSIST CONTROL  Final   Special Requests BAA,ANA,AER,5ML  Final   Culture NO GROWTH 2 DAYS  Final   Report Status PENDING  Incomplete  Culture, respiratory (NON-Expectorated)     Status: None (Preliminary result)   Collection Time: 08/29/15  2:16 PM  Result Value Ref Range Status   Specimen Description TRACHEAL ASPIRATE  Final   Special Requests NONE  Final   Gram Stain FEW WBC SEEN FEW YEAST  Final   Culture TOO YOUNG TO READ  Final   Report Status PENDING  Incomplete    Medical History: Past Medical History  Diagnosis Date  . Meniere's disease     Medications:  Scheduled:  . albumin human  25 g Intravenous Q6H  . antiseptic oral rinse  7 mL Mouth Rinse QID  . ceFEPime (MAXIPIME) IV  2 g Intravenous Q12H  . chlorhexidine gluconate (SAGE KIT)  15 mL Mouth Rinse BID  . feeding supplement (PRO-STAT SUGAR FREE 64)  30 mL Oral QID  . free water  30 mL Per Tube 6 times per day  . hydrocortisone sod succinate (SOLU-CORTEF) inj  50 mg Intravenous Q6H  . insulin aspart  0-15 Units Subcutaneous TID WC  . oseltamivir  30 mg Per Tube Daily  . pantoprazole (PROTONIX) IV  40 mg Intravenous Daily  .  senna-docusate  1 tablet Oral BID  . sodium bicarbonate  50 mEq Intravenous Once  . vancomycin  1,000 mg Intravenous Q24H   Assessment: Pharmacy consulted to dose vancomycin and cefepime in this 50 year old male admitted with AKI, possible septic shock.  Pt is currently on CRRT.   Goal of Therapy:  Vancomycin trough :  15 - 25 mcg/mL  Plan:  Continue cefepime 2 gm IV Q12H.   Continue vancomycin 1 gm IV Q24H. Will draw 1st Vanc trough before 3rd dose scheduled for 3/30 @ 19:30.   Ulice Dash D 08/30/2015,3:50 PM

## 2015-08-30 NOTE — Progress Notes (Signed)
[facility] - [place] at [facility]   PATIENT NAME: John Hoover    MR#:  [id number]  DATE OF BIRTH:  [birth year]  SUBJECTIVE:   On vent and levophed drip. Oliguria, on CRRT. Wife at bedside. Platelet decreased.  REVIEW OF SYSTEMS:    Unable to obtain.  DRUG ALLERGIES:  No Known Allergies  VITALS:  Blood pressure 89/54, pulse 101, temperature 98.8 F (37.1 C), temperature source Axillary, resp. rate 23, height 6\' 1"  (1.854 m), weight 109.6 kg (241 lb 10 oz), SpO2 100 %.  PHYSICAL EXAMINATION:   Physical Exam  GENERAL:  50 y.o.-year-old patient lying in the bed, on vent. EYES: Pupils equal, round, pinpoint. No scleral icterus.  HEENT: Head atraumatic, normocephalic. On intubation.  NECK:  Supple, no jugular venous distention. No thyroid enlargement, no tenderness.  LUNGS: Poor Resp. effort, no wheezing, rales, rhonchi. No use of accessory muscles of respiration.  CARDIOVASCULAR: S1, S2 normal. No murmurs, rubs, or gallops.  ABDOMEN: Soft, nontender, nondistended. Bowel sounds present. No organomegaly or mass.  EXTREMITIES: bilateral lower extremity positive cyanosis, weak pedal pulses, cold skin, no clubbing or edema.   NEUROLOGIC: unable to obtain. PSYCHIATRIC: on vent and sedation. SKIN: No obvious rash, lesion, or ulcer.    LABORATORY PANEL:   CBC  Recent Labs Lab 08/30/15 0433  WBC 24.3*  HGB 13.4  HCT 41.0  PLT 41*   ------------------------------------------------------------------------------------------------------------------  Chemistries   Recent Labs Lab 08/28/15 1724  08/30/15 0433  NA 133*  < > 134*  K 4.2  < > 3.8  CL 103  < > 105  CO2 13*  < > 21*  GLUCOSE 198*  < > 174*  BUN 54*  < > 45*  CREATININE 3.01*  < > 2.21*  CALCIUM 6.8*  < > 6.6*  MG  --   < > 1.9  AST 133*  --   --   ALT 35  --   --   ALKPHOS 29*  --   --   BILITOT 0.4  --   --   < > = values in this interval not  displayed. ------------------------------------------------------------------------------------------------------------------  Cardiac Enzymes  Recent Labs Lab 08/29/15 0439  TROPONINI 0.36*   ------------------------------------------------------------------------------------------------------------------  RADIOLOGY:  Ct Abdomen Pelvis Wo Contrast  08/28/2015  CLINICAL DATA:  50 year old male with history of lactic acidosis. Several days of malaise and severe myalgias. Acute renal insufficiency. Altered mental status. Metabolic acidosis. Rhabdomyolysis. EXAM: CT ABDOMEN AND PELVIS WITHOUT CONTRAST TECHNIQUE: Multidetector CT imaging of the abdomen and pelvis was performed following the standard protocol without IV contrast. COMPARISON:  No priors. FINDINGS: Lower chest: Small bilateral pleural effusions lying dependently. Areas of dependent subsegmental atelectasis are noted in the lower lobes of the lungs bilaterally. Small amount of pericardial fluid and/or thickening. Hepatobiliary: No definite cystic or solid hepatic lesions are identified on today's noncontrast CT examination. Unenhanced appearance of the gallbladder is normal. Pancreas: No pancreatic mass or peripancreatic inflammatory changes on today's noncontrast CT examination. Spleen: Unremarkable. Adrenals/Urinary Tract: There are no abnormal calcifications within the collecting system of either kidney, along the course of either ureter, or within the lumen of the urinary bladder. No hydroureteronephrosis or perinephric stranding to suggest urinary tract obstruction at this time. The unenhanced appearance of the kidneys is unremarkable bilaterally. Urinary bladder is nearly completely decompressed, with a Foley balloon catheter in position and gas non dependently in the lumen (iatrogenic). Unenhanced appearance of the adrenal glands is normal. Stomach/Bowel: Normal appearance of  the stomach. No pathologic dilatation of small bowel. There  is some moderate gaseous distention of the cecum, which is in the epigastric region, measuring up to 12.2 cm in diameter. This appears to be incidental related to a hypermobile cecum, as there are no other imaging findings to suggest an acute findings such is a cecal volvulus at this time. Normal appendix. Vascular/Lymphatic: No significant atherosclerotic calcifications identified in the abdominal or pelvic vasculature. There is a right common femoral central venous catheter with tip terminating in the inferior vena cava. There also appears to be a small right common femoral artery arterial line, extending into the right external iliac artery. Soft tissue stranding and poor definition ae in the fat planes between the tissues of the upper right thigh suggest resolving iatrogenic hemorrhage (no large hematomas identified). No lymphadenopathy noted in the abdomen or pelvis. Reproductive: Prostate gland and seminal vesicles are unremarkable in appearance. Other: Small volume of ascites predominantly in the pericolic gutters. No pneumoperitoneum. Musculoskeletal: There are no aggressive appearing lytic or blastic lesions noted in the visualized portions of the skeleton. IMPRESSION: 1. No acute findings in the abdomen or pelvis. 2. Mildly dilated cecum appears be incidental, and the unusual position of the cecum appears to be related to hypermobile mesentery. No evidence to suggest cecal volvulus at this time. 3. Normal appendix. 4. Small volume of ascites. 5. Small amount of pericardial fluid and/or thickening. 6. Small bilateral pleural effusions with some dependent passive atelectasis in the lower lobes of the lungs bilaterally. 7. Additional incidental findings, as above. Electronically Signed   By: Trudie Reed M.D.   On: 08/28/2015 19:39   Dg Abd 1 View  08/28/2015  CLINICAL DATA:  50 year old male status post nasogastric tube placement. EXAM: ABDOMEN - 1 VIEW COMPARISON:  No priors. FINDINGS: Tip of  nasogastric tube is in the body of the stomach. Gas and stool are noted throughout the colon extending to the level of the distal rectum. There is a dilated loop of colon in the central abdomen, which is favored to represent a high position of the cecum, measuring up to 14 mm in diameter. No other dilated loops of small bowel or colon are noted. No gross evidence of pneumoperitoneum on this single supine view of the abdomen. Central venous catheter from right femoral approach extending into the inferior vena cava. IMPRESSION: 1. Nasogastric tube is in the body of the stomach. 2. Unusual dilated loop of colon in the epigastric region, which is likely a dilated cecum. Clinical correlation for signs and symptoms of cecal volvulus is recommended. Further evaluation with CT of the abdomen and pelvis should be considered if clinically appropriate. These results will be called to the ordering clinician or representative by the Radiologist Assistant, and communication documented in the PACS or zVision Dashboard. Electronically Signed   By: Trudie Reed M.D.   On: 08/28/2015 18:26   Ct Head Wo Contrast  08/28/2015  CLINICAL DATA:  Acute onset of altered mental status. EXAM: CT HEAD WITHOUT CONTRAST TECHNIQUE: Contiguous axial images were obtained from the base of the skull through the vertex without intravenous contrast. COMPARISON:  None. FINDINGS: The ventricles are normal in size and configuration except for a slight dimpling or outpouching of the lateral ventricle on the right. 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 I do not see a definite closed or open lipped schizencephaly. No extra-axial fluid collections are identified. The gray-white differentiation is normal. No CT findings for acute intracranial process such as  hemorrhage or infarction. No mass lesions. The brainstem and cerebellum are grossly normal. The bony structures are intact. The paranasal sinuses and mastoid air cells are clear. The globes  are intact. IMPRESSION: No acute intracranial findings or mass lesion. 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 Electronically Signed   By: Rudie Meyer M.D.   On: 08/28/2015 16:15   Dg Chest Port 1 View  08/28/2015  CLINICAL DATA:  Central line placement. EXAM: PORTABLE CHEST 1 VIEW COMPARISON:  Earlier film, same date. FINDINGS: The left subclavian central venous catheter has been advanced. The tip is in the mid SVC near its junction with the brachiocephalic vein. No complicating features. The heart and lungs are stable. IMPRESSION: Left subclavian central venous catheter tip is in the mid SVC. Electronically Signed   By: Rudie Meyer M.D.   On: 08/28/2015 19:57   Dg Chest Port 1 View  08/28/2015  CLINICAL DATA:  Central line placement. EXAM: PORTABLE CHEST 1 VIEW COMPARISON:  08/28/2015 at 1725 hours. FINDINGS: The endotracheal tube is 4.5 cm above the carina. The NG tube is coursing down the esophagus and into the stomach. The left subclavian center venous catheter tip has retracted and is now in the left brachiocephalic vein and should be advanced several cm (8 to 10 cm). No pleural effusion. IMPRESSION: The left subclavian central venous catheter tip is in the left brachiocephalic vein and could be advanced several cm. Electronically Signed   By: Rudie Meyer M.D.   On: 08/28/2015 19:11   Dg Chest Port 1 View  08/28/2015  CLINICAL DATA:  Intubated EXAM: PORTABLE CHEST 1 VIEW COMPARISON:  Chest radiograph from one day prior. FINDINGS: Endotracheal tube tip is 3.8 cm above the carina. Enteric tube terminates in the gastric fundus. Left subclavian central venous catheter terminates in the upper third of the superior vena cava. Stable cardiomediastinal silhouette with normal heart size. No pneumothorax. No pleural effusion. Lungs appear clear, with no acute consolidative airspace disease and no pulmonary edema. IMPRESSION: 1.  Well-positioned support structures as described. 2. No pneumothorax.  No active cardiopulmonary disease. Electronically Signed   By: Delbert Phenix M.D.   On: 08/28/2015 18:02     ASSESSMENT AND PLAN:   50 year old male with past medical history of Mnire's disease who presents to the hospital due to fever, chills, body aches and noted to have influenza.  1. Acute respiratory failure. Continue ventilation, daily SBT as indicated per intensivist.  2. Metabolic Acidosis-actic acidosis. Better. continue CRRT, f/u nephrologist.  2. AMS/Enceophalopathy - due to metabolic encephalopathy from metabolic acidosis.  On sedation and vent.  3. Hypotension/Shock, Sepsis.  Continue levophed drip, f/u blood, urine cultures.  continue Vanco, Cefepime and IV Solucortef.   4. Acute renal failure - with shock and metabolic acidosis.  Continue CRRT per Dr. Wynelle Link.  5. Thrombocytopenia w/ polycythemia - consult Hem/Onc. Etiology unclear. Possible virus induced? F/u CBC.  6.  influenza-the cause of patient's fever chills and body aches. -The supportive care with IV fluids and Tamiflu.  7.  acute rhabdomyolysis-etiology unclear. Nontraumatic. Was treated with IV fluids, now on CRRT, follow CKs.  8.  elevated troponin-secondary to the rhabdomyolysis. No evidence of acute coronary syndrome.  Continue supportive care.  9.  Hyponatremia. Improving,  F/u BMP.  I disscussed with Dr. Dema Severin and Dr. Wynelle Link. All the records are reviewed and case discussed with Care Management/Social Workerr. Management plans discussed with the patient's wife  and they are in agreement.  CODE STATUS: Full Code  DVT Prophylaxis: Ted's and SCD's  TOTAL CRITICAL TIME TAKING CARE OF THIS PATIENT: 42 minutes.   POSSIBLE D/C IN >3 DAYS, DEPENDING ON CLINICAL CONDITION.   Shaune Pollackhen, Brendalyn Vallely M.D on 08/30/2015 at 8:28 AM  Between 7am to 6pm - Pager - [phone]  After 6pm go to [email or web address] - password [email or web address] [facility]  [place]  [facility]  Office  [phone]  CC: Primary care physician; Crawford GivensGraham Duncan, MD

## 2015-08-30 NOTE — Progress Notes (Signed)
Dr Dema SeverinMungal was notified that patient's stool is hemocult positive.

## 2015-08-30 NOTE — Progress Notes (Signed)
[facility]  ROUNDING NOTE   Subjective:   Wife at bedside.  CRRT - no UF Norepinephrine gtt 20 On bicarb gtt. 59m/hr  UOP 63  Objective:  Vital signs in last 24 hours:  Temp:  [97.7 F (36.5 C)-98.8 F (37.1 C)] 98.8 F (37.1 C) (03/30 0800) Pulse Rate:  [99-129] 118 (03/30 0900) Resp:  [20-31] 27 (03/30 0900) BP: (73-117)/(52-80) 108/71 mmHg (03/30 0900) SpO2:  [97 %-100 %] 100 % (03/30 0900) Arterial Line BP: (85-134)/(43-73) 133/71 mmHg (03/30 0900) FiO2 (%):  [30 %-40 %] 30 % (03/30 0818) Weight:  [109.6 kg (241 lb 10 oz)] 109.6 kg (241 lb 10 oz) (03/30 0500)  Weight change: 10.5 kg (23 lb 2.4 oz) Filed Weights   08/28/15 2200 08/29/15 0500 08/30/15 0500  Weight: 99.1 kg (218 lb 7.6 oz) 98.6 kg (217 lb 6 oz) 109.6 kg (241 lb 10 oz)    Intake/Output: I/O last 3 completed shifts: In: 8055.7 [I.V.:2770.8; NG/GT:684.8; IV Piggyback:4600] Out: 174 [Urine:174]   Intake/Output this shift:  Total I/O In: 134.9 [I.V.:89.4; NG/GT:45.5] Out: 9 [Urine:9]  Physical Exam: General: Critically ill   Head: +ETT +OGT  Eyes: Eyes closed, PERRL  Neck:  trachea midline, left subclavian triple lumen  Lungs:  Clear to auscultation, PRVC FiO2 40%  Heart: Regular rate and rhythm  Abdomen:  Soft, nontender  Extremities: +cyanosis +mottling   Neurologic: Sedated, intubated.   Skin: No lesions  Access: Right femoral temp HD catheter 3/28 Dr. MStevenson Clinch   Basic Metabolic Panel:  Recent Labs Lab 08/27/15 1221 08/27/15 1827  08/28/15 1721  08/28/15 2133  08/29/15 0439 08/29/15 1032 08/29/15 1641 08/29/15 2309 08/30/15 0433  NA 132* 132*  < >  --   < > 131*  < > 132* 131* 132* 133* 134*  K 5.6* 5.2*  < >  --   < > 4.6  < > 4.6 4.3 4.1 4.0 3.8  CL 104 105  < >  --   < > 101  < > 102 104 104 103 105  CO2 18* 14*  < >  --   < > 15*  < > 20* 16* 18* 22 21*  GLUCOSE 123* 116*  < >  --   < > 194*  < > 154* 168* 167* 191* 174*  BUN 26* 28*  < >  --   < > 60*  < >  50* 42* 39* 40* 45*  CREATININE 1.71* 1.56*  < >  --   < > 3.00*  < > 2.48* 2.43* 2.22* 2.25* 2.21*  CALCIUM 8.3* 7.5*  < >  --   < > 6.9*  < > 7.0* 7.7* 6.6* 6.6* 6.6*  MG 2.0 1.8  --   --   --  2.0  --  1.8  --   --   --  1.9  PHOS  --   --   --  11.4*  --   --   --  5.6*  --   --   --  3.3  < > = values in this interval not displayed.  Liver Function Tests:  Recent Labs Lab 08/27/15 1221 08/28/15 1724  AST 46* 133*  ALT 20 35  ALKPHOS 39 29*  BILITOT 0.7 0.4  PROT 5.9* 3.7*  ALBUMIN 3.2* 1.8*   No results for input(s): LIPASE, AMYLASE in the last 168 hours. No results for input(s): AMMONIA in the last 168 hours.  CBC:  Recent Labs Lab  08/27/15 1221 08/28/15 0539 08/29/15 0439 08/30/15 0433  WBC 11.0* 14.5* 27.5* 24.3*  NEUTROABS 8.1*  --   --   --   HGB 19.7* 20.6* 17.0 13.4  HCT 59.7* 64.6* 52.5* 41.0  MCV 90.1 92.4 91.6 91.3  PLT 125* 77* 59* 41*    Cardiac Enzymes:  Recent Labs Lab 08/27/15 1221 08/27/15 1827 08/28/15 0007 08/28/15 0539 08/28/15 1721 08/28/15 1724 08/28/15 2133 08/29/15 0439 08/29/15 1642  CKTOTAL 1150*  --   --  2831*  --  7388*  --  7210* 5580*  TROPONINI 0.25* 0.35* 0.33*  --  0.44*  --  0.41* 0.36*  --     BNP: Invalid input(s): POCBNP  CBG:  Recent Labs Lab 08/29/15 1721 08/29/15 1727 08/29/15 2055 08/30/15 0733 08/30/15 0735  GLUCAP 40* 115* 140* 13* 152*    Microbiology: Results for orders placed or performed during the hospital encounter of 08/27/15  Rapid Influenza A&B Antigens ([place] only)     Status: None   Collection Time: 08/27/15 12:22 PM  Result Value Ref Range Status   Influenza A ([place]) NEGATIVE NEGATIVE Final   Influenza B ([facility]) NEGATIVE NEGATIVE Final  Urine culture     Status: None   Collection Time: 08/27/15  7:40 PM  Result Value Ref Range Status   Specimen Description URINE, RANDOM  Final   Special Requests Normal  Final   Culture NO GROWTH 1 DAY  Final   Report Status 08/29/2015 FINAL   Final  MRSA PCR Screening     Status: None   Collection Time: 08/28/15  5:25 PM  Result Value Ref Range Status   MRSA by PCR NEGATIVE NEGATIVE Final    Comment:        The GeneXpert MRSA Assay (FDA approved for NASAL specimens only), is one component of a comprehensive MRSA colonization surveillance program. It is not intended to diagnose MRSA infection nor to guide or monitor treatment for MRSA infections.   Culture, blood (Routine X 2) w Reflex to ID Panel     Status: None (Preliminary result)   Collection Time: 08/28/15  5:51 PM  Result Value Ref Range Status   Specimen Description BLOOD RIGHT HAND  Final   Special Requests BAA,ANA,AER,5ML  Final   Culture NO GROWTH < 24 HOURS  Final   Report Status PENDING  Incomplete  Culture, blood (Routine X 2) w Reflex to ID Panel     Status: None (Preliminary result)   Collection Time: 08/28/15  5:51 PM  Result Value Ref Range Status   Specimen Description BLOOD RIGHT ASSIST CONTROL  Final   Special Requests BAA,ANA,AER,5ML  Final   Culture NO GROWTH < 24 HOURS  Final   Report Status PENDING  Incomplete    Coagulation Studies:  Recent Labs  08/28/15 1724  LABPROT 21.2*  INR 1.84    Urinalysis:  Recent Labs  08/27/15 1451  COLORURINE AMBER*  LABSPEC 1.027  PHURINE 5.0  GLUCOSEU NEGATIVE  HGBUR NEGATIVE  BILIRUBINUR NEGATIVE  KETONESUR NEGATIVE  PROTEINUR 30*  NITRITE NEGATIVE  LEUKOCYTESUR NEGATIVE      Imaging: Ct Abdomen Pelvis Wo Contrast  08/28/2015  CLINICAL DATA:  50 year old male with history of lactic acidosis. Several days of malaise and severe myalgias. Acute renal insufficiency. Altered mental status. Metabolic acidosis. Rhabdomyolysis. EXAM: CT ABDOMEN AND PELVIS WITHOUT CONTRAST TECHNIQUE: Multidetector CT imaging of the abdomen and pelvis was performed following the standard protocol without IV contrast. COMPARISON:  No priors. FINDINGS: Lower chest:  Small bilateral pleural effusions lying  dependently. Areas of dependent subsegmental atelectasis are noted in the lower lobes of the lungs bilaterally. Small amount of pericardial fluid and/or thickening. Hepatobiliary: No definite cystic or solid hepatic lesions are identified on today's noncontrast CT examination. Unenhanced appearance of the gallbladder is normal. Pancreas: No pancreatic mass or peripancreatic inflammatory changes on today's noncontrast CT examination. Spleen: Unremarkable. Adrenals/Urinary Tract: There are no abnormal calcifications within the collecting system of either kidney, along the course of either ureter, or within the lumen of the urinary bladder. No hydroureteronephrosis or perinephric stranding to suggest urinary tract obstruction at this time. The unenhanced appearance of the kidneys is unremarkable bilaterally. Urinary bladder is nearly completely decompressed, with a Foley balloon catheter in position and gas non dependently in the lumen (iatrogenic). Unenhanced appearance of the adrenal glands is normal. Stomach/Bowel: Normal appearance of the stomach. No pathologic dilatation of small bowel. There is some moderate gaseous distention of the cecum, which is in the epigastric region, measuring up to 12.2 cm in diameter. This appears to be incidental related to a hypermobile cecum, as there are no other imaging findings to suggest an acute findings such is a cecal volvulus at this time. Normal appendix. Vascular/Lymphatic: No significant atherosclerotic calcifications identified in the abdominal or pelvic vasculature. There is a right common femoral central venous catheter with tip terminating in the inferior vena cava. There also appears to be a small right common femoral artery arterial line, extending into the right external iliac artery. Soft tissue stranding and poor definition ae in the fat planes between the tissues of the upper right thigh suggest resolving iatrogenic hemorrhage (no large hematomas identified). No  lymphadenopathy noted in the abdomen or pelvis. Reproductive: Prostate gland and seminal vesicles are unremarkable in appearance. Other: Small volume of ascites predominantly in the pericolic gutters. No pneumoperitoneum. Musculoskeletal: There are no aggressive appearing lytic or blastic lesions noted in the visualized portions of the skeleton. IMPRESSION: 1. No acute findings in the abdomen or pelvis. 2. Mildly dilated cecum appears be incidental, and the unusual position of the cecum appears to be related to hypermobile mesentery. No evidence to suggest cecal volvulus at this time. 3. Normal appendix. 4. Small volume of ascites. 5. Small amount of pericardial fluid and/or thickening. 6. Small bilateral pleural effusions with some dependent passive atelectasis in the lower lobes of the lungs bilaterally. 7. Additional incidental findings, as above. Electronically Signed   By: Vinnie Langton M.D.   On: 08/28/2015 19:39   Dg Abd 1 View  08/28/2015  CLINICAL DATA:  50 year old male status post nasogastric tube placement. EXAM: ABDOMEN - 1 VIEW COMPARISON:  No priors. FINDINGS: Tip of nasogastric tube is in the body of the stomach. Gas and stool are noted throughout the colon extending to the level of the distal rectum. There is a dilated loop of colon in the central abdomen, which is favored to represent a high position of the cecum, measuring up to 14 mm in diameter. No other dilated loops of small bowel or colon are noted. No gross evidence of pneumoperitoneum on this single supine view of the abdomen. Central venous catheter from right femoral approach extending into the inferior vena cava. IMPRESSION: 1. Nasogastric tube is in the body of the stomach. 2. Unusual dilated loop of colon in the epigastric region, which is likely a dilated cecum. Clinical correlation for signs and symptoms of cecal volvulus is recommended. Further evaluation with CT of the abdomen and pelvis should  be considered if clinically  appropriate. These results will be called to the ordering clinician or representative by the Radiologist Assistant, and communication documented in the PACS or zVision Dashboard. Electronically Signed   By: Vinnie Langton M.D.   On: 08/28/2015 18:26   Ct Head Wo Contrast  08/28/2015  CLINICAL DATA:  Acute onset of altered mental status. EXAM: CT HEAD WITHOUT CONTRAST TECHNIQUE: Contiguous axial images were obtained from the base of the skull through the vertex without intravenous contrast. COMPARISON:  None. FINDINGS: The ventricles are normal in size and configuration except for a slight dimpling or outpouching of the lateral ventricle on the right. 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 I do not see a definite closed or open lipped schizencephaly. No extra-axial fluid collections are identified. The gray-white differentiation is normal. No CT findings for acute intracranial process such as hemorrhage or infarction. No mass lesions. The brainstem and cerebellum are grossly normal. The bony structures are intact. The paranasal sinuses and mastoid air cells are clear. The globes are intact. IMPRESSION: No acute intracranial findings or mass lesion. 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 Electronically Signed   By: Marijo Sanes M.D.   On: 08/28/2015 16:15   US Venous Img Lower Bilateral  08/30/2015  CLINICAL DATA:  Bilateral lower extremity swelling EXAM: BILATERAL LOWER EXTREMITY VENOUS DUPLEX ULTRASOUND TECHNIQUE: Doppler venous assessment of the bilateral lower extremity deep venous system was performed, including characterization of spectral flow, compressibility, and phasicity. COMPARISON:  None. FINDINGS: There is a bandage covering the right inguinal region. Evaluation of the right common femoral vein was not performed. There is complete compressibility of the right femoral and popliteal veins. Doppler analysis  demonstrates respiratory phasicity and augmentation of flow. No obvious calf vein thrombosis. There is complete compressibility of the left common femoral, femoral, and popliteal veins. There is a normal-appearing venous waveform with augmentation of flow after calf compression. IMPRESSION: Study was limited. A bandage covered the right inguinal region and evaluation of the right common femoral vein could not be performed. Allowing for this limitation, there is no evidence of DVT in the lower extremities. Electronically Signed   By: Marybelle Killings M.D.   On: 08/30/2015 10:13   Dg Chest Port 1 View  08/28/2015  CLINICAL DATA:  Central line placement. EXAM: PORTABLE CHEST 1 VIEW COMPARISON:  Earlier film, same date. FINDINGS: The left subclavian central venous catheter has been advanced. The tip is in the mid SVC near its junction with the brachiocephalic vein. No complicating features. The heart and lungs are stable. IMPRESSION: Left subclavian central venous catheter tip is in the mid SVC. Electronically Signed   By: Marijo Sanes M.D.   On: 08/28/2015 19:57   Dg Chest Port 1 View  08/28/2015  CLINICAL DATA:  Central line placement. EXAM: PORTABLE CHEST 1 VIEW COMPARISON:  08/28/2015 at 1725 hours. FINDINGS: The endotracheal tube is 4.5 cm above the carina. The NG tube is coursing down the esophagus and into the stomach. The left subclavian center venous catheter tip has retracted and is now in the left brachiocephalic vein and should be advanced several cm (8 to 10 cm). No pleural effusion. IMPRESSION: The left subclavian central venous catheter tip is in the left brachiocephalic vein and could be advanced several cm. Electronically Signed   By: Marijo Sanes M.D.   On: 08/28/2015 19:11   Dg Chest Port 1 View  08/28/2015  CLINICAL DATA:  Intubated EXAM: PORTABLE CHEST 1 VIEW COMPARISON:  Chest radiograph from one day prior. FINDINGS: Endotracheal tube tip is 3.8 cm above the carina. Enteric tube terminates  in the gastric fundus. Left subclavian central venous catheter terminates in the upper third of the superior vena cava. Stable cardiomediastinal silhouette with normal heart size. No pneumothorax. No pleural effusion. Lungs appear clear, with no acute consolidative airspace disease and no pulmonary edema. IMPRESSION: 1. Well-positioned support structures as described. 2. No pneumothorax.  No active cardiopulmonary disease. Electronically Signed   By: Ilona Sorrel M.D.   On: 08/28/2015 18:02     Medications:   . feeding supplement (VITAL 1.5 CAL) 1,000 mL (08/30/15 0932)  . midazolam (VERSED) infusion 2 mg/hr (08/30/15 0928)  . norepinephrine (LEVOPHED) Adult infusion 20.053 mcg/min (08/30/15 0933)  . pureflow 3 each (08/30/15 0430)  .  sodium bicarbonate  infusion 1000 mL 50 mL/hr at 08/30/15 0814   . antiseptic oral rinse  7 mL Mouth Rinse QID  . ceFEPime (MAXIPIME) IV  2 g Intravenous Q12H  . chlorhexidine gluconate (SAGE KIT)  15 mL Mouth Rinse BID  . feeding supplement (PRO-STAT SUGAR FREE 64)  30 mL Oral QID  . free water  100 mL Per Tube 3 times per day  . hydrocortisone sod succinate (SOLU-CORTEF) inj  50 mg Intravenous Q6H  . insulin aspart  0-15 Units Subcutaneous TID WC  . oseltamivir  30 mg Per Tube Daily  . pantoprazole (PROTONIX) IV  40 mg Intravenous Daily  . sodium bicarbonate  50 mEq Intravenous Once  . vancomycin  1,000 mg Intravenous Q24H   acetaminophen **OR** acetaminophen, albuterol, fentaNYL (SUBLIMAZE) injection, heparin, midazolam, [DISCONTINUED] ondansetron **OR** ondansetron (ZOFRAN) IV, sodium chloride, sodium chloride flush  Assessment/ Plan:  John Hoover is a 50 y.o. white male with no specific past medical history, who was admitted to [facility] on 08/27/2015  1. Acute Renal Failure 2. Metabolic Acidosis 3. Rhabdomyolysis 4. Acute respiratory failure 5. Influenza A positive 6. Hypoalbuminemia  Plan Patient with anuric urine output. Continue CRRT.   Will continue to monitor electrolytes, renal function, urine output, and volume status.  Currently hemodynamically unstable requiring vasopressors.  Start IV albumin  Checking CK level and liver function pending.  Keep bicarb gtt for now at 14m/hr   LOS: 3[place] SCaswell3/30/201710:32 AM

## 2015-08-30 NOTE — Progress Notes (Signed)
MEDICATION RELATED CONSULT NOTE - INITIAL   Pharmacy Consult for CRRT Dosing   No Known Allergies  Patient Measurements: Height: _0  (185.4 cm) Weight: 241 lb 10 oz (109.6 kg) IBW/kg (Calculated) : 79.9  Vital Signs: Temp: 99.3 F (37.4 C) (03/30 1200) Temp Source: Axillary (03/30 1200) BP: 89/71 mmHg (03/30 1000) Pulse Rate: 105 (03/30 1500) Intake/Output from previous day: 03/29 0701 - 03/30 0700 In: 5844.6 [I.V.:1809.8; MM/NO:177.1; IV Piggyback:3350] Out: 63 [Urine:63] Intake/Output from this shift: Total I/O In: 1198.6 [I.V.:478.4; NG/GT:570.2; IV Piggyback:150] Out: 41 [Urine:41]  Labs:  Recent Labs  08/28/15 0539 08/28/15 1721 08/28/15 1724 08/28/15 2133  08/29/15 0439  08/29/15 2309 08/30/15 0433 08/30/15 1043  WBC 14.5*  --   --   --   --  27.5*  --   --  24.3*  --   HGB 20.6*  --   --   --   --  17.0  --   --  13.4  --   HCT 64.6*  --   --   --   --  52.5*  --   --  41.0  --   PLT 77*  --   --   --   --  59*  --   --  41*  --   APTT  --   --  81*  --   --   --   --   --   --   --   CREATININE 2.22*  --  3.01* 3.00*  < > 2.48*  < > 2.25* 2.21* 2.33*  MG  --   --   --  2.0  --  1.8  --   --  1.9  --   PHOS  --  11.4*  --   --   --  5.6*  --   --  3.3  --   ALBUMIN  --   --  1.8*  --   --   --   --   --   --  1.6*  PROT  --   --  3.7*  --   --   --   --   --   --  3.4*  AST  --   --  133*  --   --   --   --   --   --  246*  ALT  --   --  35  --   --   --   --   --   --  74*  ALKPHOS  --   --  29*  --   --   --   --   --   --  37*  BILITOT  --   --  0.4  --   --   --   --   --   --  0.7  BILIDIR  --   --  0.2  --   --   --   --   --   --   --   IBILI  --   --  0.2*  --   --   --   --   --   --   --   < > = values in this interval not displayed. Estimated Creatinine Clearance: 48.7 mL/min (by C-G formula based on Cr of 2.33).   Microbiology: Recent Results (from the past 720 hour(s))  Rapid Influenza A&B Antigens ([place] only)     Status: None  Collection Time: 08/27/15 12:22 PM  Result Value Ref Range Status   Influenza A ([place]) NEGATIVE NEGATIVE Final   Influenza B ([facility]) NEGATIVE NEGATIVE Final  Urine culture     Status: None   Collection Time: 08/27/15  7:40 PM  Result Value Ref Range Status   Specimen Description URINE, RANDOM  Final   Special Requests Normal  Final   Culture NO GROWTH 1 DAY  Final   Report Status 08/29/2015 FINAL  Final  MRSA PCR Screening     Status: None   Collection Time: 08/28/15  5:25 PM  Result Value Ref Range Status   MRSA by PCR NEGATIVE NEGATIVE Final    Comment:        The GeneXpert MRSA Assay (FDA approved for NASAL specimens only), is one component of a comprehensive MRSA colonization surveillance program. It is not intended to diagnose MRSA infection nor to guide or monitor treatment for MRSA infections.   Culture, blood (Routine X 2) w Reflex to ID Panel     Status: None (Preliminary result)   Collection Time: 08/28/15  5:51 PM  Result Value Ref Range Status   Specimen Description BLOOD RIGHT HAND  Final   Special Requests BAA,ANA,AER,5ML  Final   Culture NO GROWTH 2 DAYS  Final   Report Status PENDING  Incomplete  Culture, blood (Routine X 2) w Reflex to ID Panel     Status: None (Preliminary result)   Collection Time: 08/28/15  5:51 PM  Result Value Ref Range Status   Specimen Description BLOOD RIGHT ASSIST CONTROL  Final   Special Requests BAA,ANA,AER,5ML  Final   Culture NO GROWTH 2 DAYS  Final   Report Status PENDING  Incomplete  Culture, respiratory (NON-Expectorated)     Status: None (Preliminary result)   Collection Time: 08/29/15  2:16 PM  Result Value Ref Range Status   Specimen Description TRACHEAL ASPIRATE  Final   Special Requests NONE  Final   Gram Stain FEW WBC SEEN FEW YEAST   Final   Culture TOO YOUNG TO READ  Final   Report Status PENDING  Incomplete    Medical History: Past Medical History  Diagnosis Date  . Meniere's disease     Medications:   Scheduled:  . albumin human  25 g Intravenous Q6H  . antiseptic oral rinse  7 mL Mouth Rinse QID  . ceFEPime (MAXIPIME) IV  2 g Intravenous Q12H  . chlorhexidine gluconate (SAGE KIT)  15 mL Mouth Rinse BID  . feeding supplement (PRO-STAT SUGAR FREE 64)  30 mL Oral QID  . free water  30 mL Per Tube 6 times per day  . hydrocortisone sod succinate (SOLU-CORTEF) inj  50 mg Intravenous Q6H  . insulin aspart  0-15 Units Subcutaneous TID WC  . oseltamivir  30 mg Per Tube Daily  . pantoprazole (PROTONIX) IV  40 mg Intravenous Daily  . senna-docusate  1 tablet Oral BID  . sodium bicarbonate  50 mEq Intravenous Once  . vancomycin  1,000 mg Intravenous Q24H   Infusions:  . feeding supplement (VITAL 1.5 CAL) 1,000 mL (08/30/15 1326)  . midazolam (VERSED) infusion 4 mg/hr (08/30/15 1449)  . norepinephrine (LEVOPHED) Adult infusion 18 mcg/min (08/30/15 1400)  . pureflow 3 each (08/30/15 0430)  .  sodium bicarbonate  infusion 1000 mL 50 mL/hr at 08/30/15 6063    Assessment: Pharmacy consulted to assist in adjusting medications for CRRT in this 50 y/o M with rhabdomyolysis.   Plan:  No further  adjustments are necessary at this time.  Pharmacy will continue to monitor and adjust per consult.   Ulice Dash D 08/30/2015,3:49 PM

## 2015-08-30 NOTE — Progress Notes (Signed)
Dr Dema SeverinMungal notified of patient's lactate 5.3.

## 2015-08-30 NOTE — Progress Notes (Signed)
PULMONARY / CRITICAL CARE MEDICINE   Name: John Hoover MRN: [id number] DOB: 1965-07-19    ADMISSION DATE:  08/27/2015  BRIEF HISTORY: [name] previously healthy [age] M admitted to gen med floor with several days of malaise and profound myalgias. Influenza PCR positive. On admission was noted to be hemoconcentrated with AKI and elevated CPK. On 03/28 developed progressive AMS and found to have profound metabolic acidosis. Transferred to ICU and PCCM consulted for management of acidosis, rhabdomyolysis, MODS, shock. Intubated, CVL placed and HD cath placed on arrival to ICU. Empiric abx initiated  SUBJECTIVE:  Agitated on the vent at times, worsening rhabdo today, still oliguric, low plts   STUDIES:  3/28 CT A/P>no acute findings, mildly dilated cecum (hypermobile mesentery) 3/28 ECHO > EF 60-65%, G1 Distolic dysfunction  SIGNIFICANT EVENTS: 3/27>cough/sob, influ A, mild rhabdo, [facility]  3/28>worsening respiratory status, tongue swelling, hypotensive, hypoxic>>shock, transferred to ICU, intubated, CVL, Aline, Vascath, rhabdo, CRRT started.  3/30 B/L LE US>no evidence of DVT  VITAL SIGNS: Temp:  [98.1 F (36.7 C)-99.3 F (37.4 C)] 98.5 F (36.9 C) (03/30 1700) Pulse Rate:  [96-129] 114 (03/30 1800) Resp:  [20-31] 24 (03/30 1800) BP: (85-117)/(53-80) 89/71 mmHg (03/30 1000) SpO2:  [99 %-100 %] 100 % (03/30 1800) Arterial Line BP: (103-134)/(54-73) 121/63 mmHg (03/30 1800) FiO2 (%):  [30 %-40 %] 30 % (03/30 1553) Weight:  [241 lb 10 oz (109.6 kg)] 241 lb 10 oz (109.6 kg) (03/30 0500) HEMODYNAMICS: CVP:  [7 mmHg-23 mmHg] 15 mmHg VENTILATOR SETTINGS: Vent Mode:  [-] PRVC FiO2 (%):  [30 %-40 %] 30 % Set Rate:  [24 bmp] 24 bmp Vt Set:  [600 mL] 600 mL PEEP:  [5 cmH20] 5 cmH20 INTAKE / OUTPUT:  Intake/Output Summary (Last 24 hours) at 08/30/15 2035 Last data filed at 08/30/15 2000  Gross per 24 hour  Intake 5073.9 ml  Output    285 ml  Net 4788.9 ml    Review of  Systems  Unable to perform ROS: intubated    Physical Exam General:  Acrocyanotic, more awake this AM, lethargic at times Neuro: PERRL, EOMI, minimal withdrawal, DTRs symmetrically diminished HEENT: NCAT, WNL Cardiovascular: tachy, regular, no murmurs  Lungs: clear anteriorly without adventitious sounds Abdomen: soft, NT, ND, diminished BS Ext: cyanosis of feet and hands, cool, diminished distal pulses Skin: no lesions noted, mottling of skin  LABS:  CBC  Recent Labs Lab 08/29/15 0439 08/30/15 0433 08/30/15 1543  WBC 27.5* 24.3* 23.4*  HGB 17.0 13.4 11.9*  HCT 52.5* 41.0 36.0*  PLT 59* 41* 28*   Coag's  Recent Labs Lab 08/28/15 1724 08/30/15 1543  APTT 81*  --   INR 1.84 1.21   BMET  Recent Labs Lab 08/30/15 0433 08/30/15 1043 08/30/15 1543  NA 134* 132* 132*  K 3.8 3.9 4.0  CL 105 104 101  CO2 21* 21* 23  BUN 45* 46* 51*  CREATININE 2.21* 2.33* 2.52*  GLUCOSE 174* 177* 139*   Electrolytes  Recent Labs Lab 08/28/15 1721  08/28/15 2133  08/29/15 0439  08/30/15 0433 08/30/15 1043 08/30/15 1543  CALCIUM  --   < > 6.9*  < > 7.0*  < > 6.6* 6.4* 6.8*  MG  --   --  2.0  --  1.8  --  1.9  --   --   PHOS 11.4*  --   --   --  5.6*  --  3.3  --   --   < > =  values in this interval not displayed. Sepsis Markers  Recent Labs Lab 08/28/15 1724 08/28/15 2133 08/30/15 1543  LATICACIDVEN 10.1* 5.4* 5.3*   ABG  Recent Labs Lab 08/29/15 0006 08/29/15 0400 08/30/15 1512  PHART 7.21* 7.25* 7.45  PCO2ART 34 36 31*  PO2ART 271* 242* 63*   Liver Enzymes  Recent Labs Lab 08/28/15 1724 08/30/15 1043 08/30/15 1543  AST 133* 246* 296*  ALT 35 74* 87*  ALKPHOS 29* 37* 34*  BILITOT 0.4 0.7 0.8  ALBUMIN 1.8* 1.6* 2.0*   Cardiac Enzymes  Recent Labs Lab 08/28/15 2133 08/29/15 0439 08/30/15 1543  TROPONINI 0.41* 0.36* 0.50*   Glucose  Recent Labs Lab 08/29/15 2055 08/30/15 0733 08/30/15 0735 08/30/15 1138 08/30/15 1605 08/30/15 2006   GLUCAP 140* 13* 152* 146* 129* 127*    Imaging US Venous Img Lower Bilateral  08/30/2015  CLINICAL DATA:  Bilateral lower extremity swelling EXAM: BILATERAL LOWER EXTREMITY VENOUS DUPLEX ULTRASOUND TECHNIQUE: Doppler venous assessment of the bilateral lower extremity deep venous system was performed, including characterization of spectral flow, compressibility, and phasicity. COMPARISON:  None. FINDINGS: There is a bandage covering the right inguinal region. Evaluation of the right common femoral vein was not performed. There is complete compressibility of the right femoral and popliteal veins. Doppler analysis demonstrates respiratory phasicity and augmentation of flow. No obvious calf vein thrombosis. There is complete compressibility of the left common femoral, femoral, and popliteal veins. There is a normal-appearing venous waveform with augmentation of flow after calf compression. IMPRESSION: Study was limited. A bandage covered the right inguinal region and evaluation of the right common femoral vein could not be performed. Allowing for this limitation, there is no evidence of DVT in the lower extremities. Electronically Signed   By: Jolaine Click M.D.   On: 08/30/2015 10:13   Dg Abd Portable 1v  08/30/2015  CLINICAL DATA:  Abdominal distention.  Dialysis. EXAM: PORTABLE ABDOMEN - 1 VIEW COMPARISON:  CT abdomen and pelvis 08/28/2015. FINDINGS: The bowel gas pattern is now normal. Previously noted distention of the cecum has resolved. A right femoral dialysis catheter is again noted. The axial skeleton is unremarkable. IMPRESSION: 1. Interval resolution of dilated cecum. 2. Normal bowel gas pattern. 3. Right femoral dialysis catheter. Electronically Signed   By: Marin Roberts M.D.   On: 08/30/2015 11:23   INDWELLING DEVICES:: ETT 03/28 >>  L [place] CVL 03/28 >> R femoral HD cath 03/28 >> R femoral A-line 03/28 >>  MICRO DATA: Flu PCR 03/27 >> positive for A, neg for H1N1 Urine 03/27 >>   Blood 03/28 >> Trach aspirate 3/29>>  ANTIMICROBIALS:  Oseltamivir 03/27 >>  Vanc 03/28 >>3/30 (stopped due to thrombocytopenia) Cefepime 03/28   DISCUSSION: 50 yo male admitted with URI Sx due to Influ A, decompensated into rhabdo, severe metabolic acidosis and shock requiring intubation, CRRT. Overall with no significant improvement over the last 2 days.   ASSESSMENT / PLAN: PULMONARY A: Acute resp failure - intubated due to severe acidosis and AMS Shock P:  Vent settings established Maintain sats >90% Vent bundle implemented Daily SBT as indicated Acidosis improving, slowly, on Bicarb gtt now ABG PRN Check trach aspirate  CARDIOVASCULAR A:  Shock - unclear etiology suspect septic, possibly due to Influ A P:  CVP goal 10-14, today is 10 PRN NS boluses MAP goal > 65 mmHg Norepinephrine infusion Empiric hydrocortisone for possible adrenal insufficiency Try to avoid additional pressor due to skin mottling of the extermities.   RENAL A:  AKI - likely ATN + NSAIDS Severe metabolic acidosis - improving on CRRT Rhabdomyolysis - CK peaked at 7388 P:  Monitor BMET intermittently Monitor I/Os Correct electrolytes as indicated CRRT per Renal service  Monitor BMET and ABG frequently until stabilized HCO3 gtt /hr, also receiving intermittent doses of albumin  GASTROINTESTINAL A:  Nonspecific bowel gas pattern on KUB Hemoccult positive 3/30 P:  SUP: IV PPI CTAP with no acute findings to suggest ishemia Monitor H/H Consider TFs when hemodynamically stable  HEMATOLOGIC A:  Polycythemia - suspect hemoconcentrated Thrombocytopenia P:  DVT px: SCDs Monitor CBC intermittently Transfuse per usual guidelines Stop vanc, as plts continue to reduce. Severe sepsis probably causing profound thrombocytopenia along with crrt.   INFECTIOUS A:  Severe sepsis P:  Monitor temp, WBC count Micro and abx as above Possibly due to Influ A F\u  cultures  ENDOCRINE A:  Stress induced hyperglycemia P:  CBG q 8 hrs Consider SSI for glu > 180  NEUROLOGIC A:  Acute encephalopathy, mild agitation ICU/vent associated discomfort P:  RASS goal: -1, -2 PAD protocol Sedation adjusted to versed gtt and Fentanyl pushes due to hypotension.    FAMILY  - Updates: Wife updated in detail at bedside.    Thank you for consulting [facility], Please feel free to contacts [place] with any questions at [phone] (please enter 7-digits).  I have personally obtained a history, examined the patient, evaluated laboratory and imaging results, formulated the assessment and plan and placed orders.  The Patient requires high complexity decision making for assessment and support, frequent evaluation and titration of therapies, application of advanced monitoring technologies and extensive interpretation of multiple databases. Critical Care Time devoted to patient care services described in this note is 40 minutes.   Overall, patient is critically ill, prognosis is guarded. Patient at high risk for cardiac arrest and death.    Stephanie Acre, MD  Pulmonary and Critical Care Pager [phone] (please enter 7-digits) On Call Pager - [phone] (please enter 7-digits)  Note: This note was prepared with Dragon dictation along with smaller phrase technology. Any transcriptional errors that result from this process are unintentional.

## 2015-08-30 NOTE — Progress Notes (Signed)
Inpatient Diabetes Program Recommendations  AACE/ADA: New Consensus Statement on Inpatient Glycemic Control (2015)  Target Ranges:  Prepandial:   less than 140 mg/dL      Peak postprandial:   less than 180 mg/dL (1-2 hours)      Critically ill patients:  140 - 180 mg/dL   Review of Glycemic Control  Results for Peterson AoCREASY, Zarion E (MRN [id number]) as of 08/30/2015 11:56  Ref. Range 08/29/2015 17:27 08/29/2015 20:55 08/30/2015 07:33 08/30/2015 07:35 08/30/2015 11:38  Glucose-Capillary Latest Ref Range: 65-99 mg/dL 562115 (H) 130140 (H) 13 (LL) 152 (H) 146 (H)    Diabetes history: None noted Outpatient Diabetes medications: none Current orders for Inpatient glycemic control: Novolog 0-15 units tid  Inpatient Diabetes Program Recommendations:  Since patient is NPO and has poor renal function consider changing the Novolog to 0-15 units q6h.   Spoke with RN caring for the patient today-  13mg /dl blood sugar was taken incorrectly (on limb with IV Levophed) and therefore blood sugar was rechecked - insulin was given for a blood sugar of 152 mg/dl.   Susette RacerJulie Alassane Kalafut, RN, BA, MHA, CDE Diabetes Coordinator Inpatient Diabetes Program  [phone] (Team Pager) [phone] [facility] Office) 08/30/2015 11:57 AM

## 2015-08-31 ENCOUNTER — Inpatient Hospital Stay: Payer: BLUE CROSS/BLUE SHIELD

## 2015-08-31 DIAGNOSIS — J09X2 Influenza due to identified novel influenza A virus with other respiratory manifestations: Secondary | ICD-10-CM

## 2015-08-31 DIAGNOSIS — Z9911 Dependence on respirator [ventilator] status: Secondary | ICD-10-CM

## 2015-08-31 DIAGNOSIS — R400 Somnolence: Secondary | ICD-10-CM

## 2015-08-31 DIAGNOSIS — D696 Thrombocytopenia, unspecified: Secondary | ICD-10-CM

## 2015-08-31 DIAGNOSIS — R6520 Severe sepsis without septic shock: Secondary | ICD-10-CM

## 2015-08-31 LAB — RETICULOCYTES
RBC.: 3.65 MIL/uL — ABNORMAL LOW (ref 4.40–5.90)
Retic Count, Absolute: 21.9 10*3/uL (ref 19.0–183.0)
Retic Ct Pct: 0.6 % (ref 0.4–3.1)

## 2015-08-31 LAB — COMPREHENSIVE METABOLIC PANEL
ALT: 147 U/L — AB (ref 17–63)
AST: 497 U/L — AB (ref 15–41)
Albumin: 2.3 g/dL — ABNORMAL LOW (ref 3.5–5.0)
Alkaline Phosphatase: 38 U/L (ref 38–126)
Anion gap: 4 — ABNORMAL LOW (ref 5–15)
BUN: 55 mg/dL — AB (ref 6–20)
CALCIUM: 7.2 mg/dL — AB (ref 8.9–10.3)
CO2: 27 mmol/L (ref 22–32)
CREATININE: 2.5 mg/dL — AB (ref 0.61–1.24)
Chloride: 102 mmol/L (ref 101–111)
GFR calc non Af Amer: 28 mL/min — ABNORMAL LOW (ref >60)
GFR, EST AFRICAN AMERICAN: 33 mL/min — AB (ref >60)
GLUCOSE: 122 mg/dL — AB (ref 65–99)
Potassium: 3.6 mmol/L (ref 3.5–5.1)
SODIUM: 133 mmol/L — AB (ref 135–145)
Total Bilirubin: 0.7 mg/dL (ref 0.3–1.2)
Total Protein: 4 g/dL — ABNORMAL LOW (ref 6.5–8.1)

## 2015-08-31 LAB — CBC WITH DIFFERENTIAL/PLATELET
BASOS ABS: 0 10*3/uL (ref 0–0.1)
EOS ABS: 0 10*3/uL (ref 0–0.7)
HCT: 32.5 % — ABNORMAL LOW (ref 40.0–52.0)
Hemoglobin: 11 g/dL — ABNORMAL LOW (ref 13.0–18.0)
Lymphocytes Relative: 4 %
Lymphs Abs: 0.8 10*3/uL — ABNORMAL LOW (ref 1.0–3.6)
MCH: 30 pg (ref 26.0–34.0)
MCHC: 33.7 g/dL (ref 32.0–36.0)
MCV: 89 fL (ref 80.0–100.0)
MONO ABS: 1.4 10*3/uL — AB (ref 0.2–1.0)
Monocytes Relative: 8 %
NEUTROS ABS: 16.6 10*3/uL — AB (ref 1.4–6.5)
Neutrophils Relative %: 88 %
PLATELETS: 26 10*3/uL — AB (ref 150–440)
RBC: 3.65 MIL/uL — ABNORMAL LOW (ref 4.40–5.90)
RDW: 15 % — AB (ref 11.5–14.5)
WBC: 18.9 10*3/uL — ABNORMAL HIGH (ref 3.8–10.6)

## 2015-08-31 LAB — BLOOD GAS, ARTERIAL
ACID-BASE DEFICIT: 20.3 mmol/L — AB (ref 0.0–2.0)
Acid-Base Excess: 2.8 mmol/L (ref 0.0–3.0)
Bicarbonate: 10.3 mEq/L — ABNORMAL LOW (ref 21.0–28.0)
Bicarbonate: 25.5 mEq/L (ref 21.0–28.0)
FIO2: 100
FIO2: 30
LHR: 20 {breaths}/min
MECHVT: 600 mL
Mechanical Rate: 20
O2 SAT: 99.9 %
O2 Saturation: 97.9 %
PATIENT TEMPERATURE: 37
PATIENT TEMPERATURE: 37
PCO2 ART: 40 mmHg (ref 32.0–48.0)
PEEP/CPAP: 5 cmH2O
PEEP: 5 cmH2O
PH ART: 7.02 — AB (ref 7.350–7.450)
PO2 ART: 447 mmHg — AB (ref 83.0–108.0)
PO2 ART: 93 mmHg (ref 83.0–108.0)
RATE: 24 resp/min
VT: 600 mL
pCO2 arterial: 32 mmHg (ref 32.0–48.0)
pH, Arterial: 7.51 — ABNORMAL HIGH (ref 7.350–7.450)

## 2015-08-31 LAB — BASIC METABOLIC PANEL
ANION GAP: 6 (ref 5–15)
Anion gap: 8 (ref 5–15)
BUN: 51 mg/dL — AB (ref 6–20)
BUN: 47 mg/dL — ABNORMAL HIGH (ref 6–20)
CALCIUM: 7.5 mg/dL — AB (ref 8.9–10.3)
CHLORIDE: 103 mmol/L (ref 101–111)
CO2: 23 mmol/L (ref 22–32)
CO2: 24 mmol/L (ref 22–32)
Calcium: 7.4 mg/dL — ABNORMAL LOW (ref 8.9–10.3)
Chloride: 104 mmol/L (ref 101–111)
Creatinine, Ser: 2.24 mg/dL — ABNORMAL HIGH (ref 0.61–1.24)
Creatinine, Ser: 2.12 mg/dL — ABNORMAL HIGH (ref 0.61–1.24)
GFR calc Af Amer: 37 mL/min — ABNORMAL LOW (ref >60)
GFR calc non Af Amer: 32 mL/min — ABNORMAL LOW (ref >60)
GFR, EST AFRICAN AMERICAN: 40 mL/min — AB (ref >60)
GFR, EST NON AFRICAN AMERICAN: 34 mL/min — AB (ref >60)
Glucose, Bld: 152 mg/dL — ABNORMAL HIGH (ref 65–99)
Glucose, Bld: 153 mg/dL — ABNORMAL HIGH (ref 65–99)
POTASSIUM: 3.7 mmol/L (ref 3.5–5.1)
POTASSIUM: 3.8 mmol/L (ref 3.5–5.1)
SODIUM: 134 mmol/L — AB (ref 135–145)
Sodium: 134 mmol/L — ABNORMAL LOW (ref 135–145)

## 2015-08-31 LAB — HEPATIC FUNCTION PANEL
ALK PHOS: 37 U/L — AB (ref 38–126)
ALT: 135 U/L — AB (ref 17–63)
AST: 495 U/L — AB (ref 15–41)
Albumin: 2.9 g/dL — ABNORMAL LOW (ref 3.5–5.0)
BILIRUBIN DIRECT: 0.3 mg/dL (ref 0.1–0.5)
Indirect Bilirubin: 0.6 mg/dL (ref 0.3–0.9)
Total Bilirubin: 0.9 mg/dL (ref 0.3–1.2)
Total Protein: 4.4 g/dL — ABNORMAL LOW (ref 6.5–8.1)

## 2015-08-31 LAB — APTT: aPTT: 34 seconds (ref 24–36)

## 2015-08-31 LAB — LIPASE, BLOOD: Lipase: 11 U/L (ref 11–51)

## 2015-08-31 LAB — CK: CK TOTAL: 22702 U/L — AB (ref 49–397)

## 2015-08-31 LAB — GLUCOSE, CAPILLARY
GLUCOSE-CAPILLARY: 130 mg/dL — AB (ref 65–99)
Glucose-Capillary: 128 mg/dL — ABNORMAL HIGH (ref 65–99)
Glucose-Capillary: 136 mg/dL — ABNORMAL HIGH (ref 65–99)
Glucose-Capillary: 65 mg/dL (ref 65–99)

## 2015-08-31 LAB — MAGNESIUM: MAGNESIUM: 2.1 mg/dL (ref 1.7–2.4)

## 2015-08-31 LAB — AMYLASE: Amylase: 29 U/L (ref 28–100)

## 2015-08-31 LAB — PHOSPHORUS: Phosphorus: 2.3 mg/dL — ABNORMAL LOW (ref 2.5–4.6)

## 2015-08-31 MED ORDER — LORAZEPAM 2 MG/ML IJ SOLN
0.5000 mg | INTRAMUSCULAR | Status: DC | PRN
  Administered 2015-08-31 – 2015-09-01 (×2): 1 mg via INTRAVENOUS
  Filled 2015-08-31 (×2): qty 1

## 2015-08-31 MED ORDER — PANTOPRAZOLE SODIUM 40 MG PO PACK
40.0000 mg | PACK | Freq: Every day | ORAL | Status: DC
  Administered 2015-09-01 – 2015-09-09 (×9): 40 mg
  Filled 2015-08-31 (×9): qty 20

## 2015-08-31 MED ORDER — NOREPINEPHRINE BITARTRATE 1 MG/ML IV SOLN: 2.0000 ug/min | INTRAVENOUS | Status: DC

## 2015-08-31 MED ORDER — FENTANYL 2500MCG IN NS 250ML (10MCG/ML) PREMIX INFUSION
10.0000 ug/h | INTRAVENOUS | Status: DC
  Administered 2015-08-31: 50 ug/h via INTRAVENOUS
  Filled 2015-08-31: qty 250

## 2015-08-31 MED ORDER — PANTOPRAZOLE SODIUM 40 MG IV SOLR: 40.0000 mg | Freq: Two times a day (BID) | INTRAVENOUS | Status: DC

## 2015-08-31 MED ORDER — NOREPINEPHRINE 4 MG/250ML-% IV SOLN
0.0000 ug/min | INTRAVENOUS | Status: DC
  Administered 2015-08-31: 10 ug/min via INTRAVENOUS
  Filled 2015-08-31 (×3): qty 250

## 2015-08-31 MED ORDER — ALBUTEROL SULFATE (2.5 MG/3ML) 0.083% IN NEBU
2.5000 mg | INHALATION_SOLUTION | RESPIRATORY_TRACT | Status: DC | PRN
  Administered 2015-09-04 – 2015-09-06 (×2): 2.5 mg via RESPIRATORY_TRACT
  Filled 2015-08-31: qty 3

## 2015-08-31 MED ORDER — FENTANYL CITRATE (PF) 100 MCG/2ML IJ SOLN
25.0000 ug | INTRAMUSCULAR | Status: DC | PRN
  Administered 2015-08-31 (×2): 100 ug via INTRAVENOUS
  Filled 2015-08-31 (×2): qty 2

## 2015-08-31 MED ORDER — DEXMEDETOMIDINE HCL IN NACL 200 MCG/50ML IV SOLN
0.4000 ug/kg/h | INTRAVENOUS | Status: DC
  Administered 2015-08-31: 0.4 ug/kg/h via INTRAVENOUS
  Administered 2015-08-31: 0.5 ug/kg/h via INTRAVENOUS
  Administered 2015-09-01 (×2): 0.8 ug/kg/h via INTRAVENOUS
  Administered 2015-09-01: 1.2 ug/kg/h via INTRAVENOUS
  Administered 2015-09-01: 0.8 ug/kg/h via INTRAVENOUS
  Administered 2015-09-01: 1 ug/kg/h via INTRAVENOUS
  Administered 2015-09-01 – 2015-09-02 (×4): 0.8 ug/kg/h via INTRAVENOUS
  Filled 2015-08-31 (×5): qty 100
  Filled 2015-08-31: qty 50
  Filled 2015-08-31: qty 100
  Filled 2015-08-31 (×2): qty 50
  Filled 2015-08-31 (×2): qty 100
  Filled 2015-08-31: qty 50
  Filled 2015-08-31: qty 100

## 2015-08-31 MED ORDER — HYDROCORTISONE NA SUCCINATE PF 100 MG IJ SOLR
50.0000 mg | Freq: Two times a day (BID) | INTRAMUSCULAR | Status: DC
  Administered 2015-08-31 – 2015-09-02 (×3): 50 mg via INTRAVENOUS
  Filled 2015-08-31 (×4): qty 2

## 2015-08-31 NOTE — Progress Notes (Signed)
PULMONARY / CRITICAL CARE MEDICINE   Name: John Hoover MRN: [id number] DOB: 1966/02/17    ADMISSION DATE:  08/27/2015 CONSULTATION DATE:  08/28/15  REFERRING MD: Cherlynn KaiserSainani  PT PROFILE: Young previously healthy [age] M admitted to gen med floor with several days of malaise and profound myalgias. Influenza PCR positive. On admission was noted to be hemoconcentrated with AKI and elevated CPK. On 03/28 developed progressive AMS and found to have profound metabolic acidosis. Transferred to ICU and PCCM consulted for management of acidosis, rhabdomyolysis, MODS, shock. Intubated, CVL placed and HD cath placed on arrival to ICU. Empiric abx initiated  MAJOR EVENTS/TEST RESULTS: 03/27 Admitted as above 03/28 transferred to ICU, intubated, CVL placed, HD cath placed, vasopressors initiated, Renal consult, CRRT initiated, HCO3 gtt initiated, empiric abx initated 03/28 CT head: NAD 03/28 Echocardiogram: LVEF 60-65%, grade one diastolic dysfunction 03/28 CTAP: no acute findings 03/30 BLE venous US: no DVT 03/31 Acidosis much improved. CRRT stopped, HCO3 gtt stopped 03/31 Transition off midaz infusion to dexmedetomidine  INDWELLING DEVICES:: ETT 03/28 >>  L [place] CVL 03/28 >>  R femoral HD cath 03/28 >>  R femoral A-line 03/28 >>   MICRO DATA: Flu PCR 03/27 >> positive for A, neg for H1N1 MRSA PCR 03/28 >> NEG Urine 03/27 >> NEG Blood 03/28 >> NEG Resp 03/29 >>  Resp 03/29 >>  Pneumocystis DFA 03/31 >>  HIV 04/01 >>   ANTIMICROBIALS:  Oseltamivir 03/27 >>  Vanc 03/28 >> 03/31 Cefepime 03/28 >>   SUBJECTIVE:  RASS -3 to +2. Intermittently F/C  VITAL SIGNS: BP 89/71 mmHg  Pulse 99  Temp(Src) 98 F (36.7 C) (Axillary)  Resp 27  Ht 6\' 1"  (1.854 m)  Wt 113.2 kg (249 lb 9 oz)  BMI 32.93 kg/m2  SpO2 99%  HEMODYNAMICS: CVP:  [9 mmHg-15 mmHg] 11 mmHg  VENTILATOR SETTINGS: Vent Mode:  [-] PSV;CPAP FiO2 (%):  [30 %] 30 % Set Rate:  [20 bmp] 20 bmp Vt Set:  [600 mL] 600 mL PEEP:   [5 cmH20] 5 cmH20 Pressure Support:  [10 cmH20] 10 cmH20  INTAKE / OUTPUT: I/O last 3 completed shifts: In: 7103.8 [I.V.:2477.9; NG/GT:1875.9; IV Piggyback:2750] Out: 939 [Urine:216; Other:723]  PHYSICAL EXAMINATION: General: RASS -3, WDWN Neuro: PERRL, EOMI, MAEs, DTRs symmetric HEENT: NCAT, WNL Cardiovascular: Regular, no murmurs  Lungs: copious purulent resp secretions, diffuse rhonchi Abdomen: soft, NT, ND, diminished BS Ext: minimal cyanosis of feet and hands, warm  LABS:  BMET  Recent Labs Lab 08/30/15 2241 08/31/15 0447 08/31/15 0915  NA 133* 134* 134*  K 3.6 3.7 3.8  CL 102 103 104  CO2 24 23 24   BUN 52* 51* 47*  CREATININE 2.29* 2.24* 2.12*  GLUCOSE 159* 152* 153*    Electrolytes  Recent Labs Lab 08/29/15 0439  08/30/15 0433  08/30/15 2241 08/31/15 0447 08/31/15 0915  CALCIUM 7.0*  < > 6.6*  < > 6.9* 7.4* 7.5*  MG 1.8  --  1.9  --   --  2.1  --   PHOS 5.6*  --  3.3  --   --  2.3*  --   < > = values in this interval not displayed.  CBC  Recent Labs Lab 08/30/15 0433 08/30/15 1543 08/31/15 0447  WBC 24.3* 23.4* 18.9*  HGB 13.4 11.9* 11.0*  HCT 41.0 36.0* 32.5*  PLT 41* 28* 26*    Coag's  Recent Labs Lab 08/28/15 1724 08/30/15 1543 08/31/15 0447  APTT 81*  --  34  INR 1.84 1.21  --     Sepsis Markers  Recent Labs Lab 08/28/15 1724 08/28/15 2133 08/30/15 1543  LATICACIDVEN 10.1* 5.4* 5.3*    ABG  Recent Labs Lab 08/29/15 0400 08/30/15 1512 08/31/15 0930  PHART 7.25* 7.45 7.51*  PCO2ART 36 31* 32  PO2ART 242* 63* 93    Liver Enzymes  Recent Labs Lab 08/30/15 1043 08/30/15 1543 08/31/15 0915  AST 246* 296* 495*  ALT 74* 87* 135*  ALKPHOS 37* 34* 37*  BILITOT 0.7 0.8 0.9  ALBUMIN 1.6* 2.0* 2.9*    Cardiac Enzymes  Recent Labs Lab 08/28/15 2133 08/29/15 0439 08/30/15 1543  TROPONINI 0.41* 0.36* 0.50*    Glucose  Recent Labs Lab 08/30/15 1138 08/30/15 1605 08/30/15 2006 08/31/15 0005  08/31/15 0733 08/31/15 1136  GLUCAP 146* 129* 127* 136* 130* 128*    CXR: RUL infiltrate/atx, LLL atx/inf    ASSESSMENT / PLAN:  PULMONARY A: Acute hypoxic resp failure Patchy pulmonary infiltrates Copious purulent secretions P:   Cont full vent support - settings reviewed and/or adjusted Cont vent bundle Daily SBT if/when meets criteria  CARDIOVASCULAR A:  Shock, resolved P:  MAP goal > 65 mmHg Norepinephrine infusion Tapering empiric hydrocortisone as permitted  RENAL A:   AKI - likely ATN + NSAIDS Severe lactic acidosis, resolved P:   Monitor BMET intermittently Monitor I/Os Correct electrolytes as indicated CRRT stopped 03/31 per Renal service   GASTROINTESTINAL A:   Heme + stools likely related to severe thrombocytopenia P:   SUP: enteral PPI Cont TFs  HEMATOLOGIC A:   Polycythemia, resolved Mild anemia, acute blood loss - hemodynamically stable Severe thrombocytopenia P:  DVT px: SCDs Monitor CBC intermittently Transfuse per usual guidelines  INFECTIOUS A:   Severe sepsis - etiology remains unclear P:   Monitor temp, WBC count Micro and abx as above  ENDOCRINE A:   Mild stress induced hyperglycemia P:   DC SSI CBG q 8 hrs Consider resuming SSI for glu > 180  NEUROLOGIC A:   Acute ICU acquired encephalopathy ICU/vent associated discomfort P:   RASS goal: 0, -1 dexmedetomidine initiated 03/31 PRN lorazepam PRN fentanyl   FAMILY  - Updates: Wife updated in detail  CCM time: 45 mins The above time includes time spent in consultation with patient and/or family members and reviewing care plan on multidisciplinary rounds  Billy Fischer, MD PCCM service Mobile [phone] Pager [phone]    08/31/2015, 4:02 PM

## 2015-08-31 NOTE — Progress Notes (Signed)
MEDICATION RELATED CONSULT NOTE - INITIAL   Pharmacy Consult for CRRT Dosing/Cefepime dosing    No Known Allergies  Patient Measurements: Height: 6' 1"  (185.4 cm) Weight: 249 lb 9 oz (113.2 kg) IBW/kg (Calculated) : 79.9  Vital Signs: Temp: 98.1 F (36.7 C) (03/31 0800) Temp Source: Axillary (03/31 0800) Pulse Rate: 101 (03/31 1000) Intake/Output from previous day: 03/30 0701 - 03/31 0700 In: 3399.4 [I.V.:1561; ID/HW:8616.8; IV Piggyback:500] Out: 853 [Urine:178] Intake/Output from this shift: Total I/O In: 105 [I.V.:55; NG/GT:50] Out: -   Labs:  Recent Labs  08/28/15 1724  08/29/15 0439  08/30/15 0433 08/30/15 1043 08/30/15 1543 08/30/15 2241 08/31/15 0447 08/31/15 0915  WBC  --   < > 27.5*  --  24.3*  --  23.4*  --  18.9*  --   HGB  --   < > 17.0  --  13.4  --  11.9*  --  11.0*  --   HCT  --   < > 52.5*  --  41.0  --  36.0*  --  32.5*  --   PLT  --   < > 59*  --  41*  --  28*  --  26*  --   APTT 81*  --   --   --   --   --   --   --  34  --   CREATININE 3.01*  < > 2.48*  < > 2.21* 2.33* 2.52* 2.29* 2.24* 2.12*  MG  --   < > 1.8  --  1.9  --   --   --  2.1  --   PHOS  --   --  5.6*  --  3.3  --   --   --  2.3*  --   ALBUMIN 1.8*  --   --   --   --  1.6* 2.0*  --   --  2.9*  PROT 3.7*  --   --   --   --  3.4* 3.7*  --   --  4.4*  AST 133*  --   --   --   --  246* 296*  --   --  495*  ALT 35  --   --   --   --  74* 87*  --   --  135*  ALKPHOS 29*  --   --   --   --  37* 34*  --   --  37*  BILITOT 0.4  --   --   --   --  0.7 0.8  --   --  0.9  BILIDIR 0.2  --   --   --   --   --   --   --   --  0.3  IBILI 0.2*  --   --   --   --   --   --   --   --  0.6  < > = values in this interval not displayed. Estimated Creatinine Clearance: 54.3 mL/min (by C-G formula based on Cr of 2.12).   Microbiology: Recent Results (from the past 720 hour(s))  Rapid Influenza A&B Antigens ([place] only)     Status: None   Collection Time: 08/27/15 12:22 PM  Result Value Ref Range  Status   Influenza A ([place]) NEGATIVE NEGATIVE Final   Influenza B [facility]) NEGATIVE NEGATIVE Final  Urine culture     Status: None   Collection Time: 08/27/15  7:40 PM  Result Value Ref Range Status   Specimen Description URINE, RANDOM  Final   Special Requests Normal  Final   Culture NO GROWTH 1 DAY  Final   Report Status 08/29/2015 FINAL  Final  MRSA PCR Screening     Status: None   Collection Time: 08/28/15  5:25 PM  Result Value Ref Range Status   MRSA by PCR NEGATIVE NEGATIVE Final    Comment:        The GeneXpert MRSA Assay (FDA approved for NASAL specimens only), is one component of a comprehensive MRSA colonization surveillance program. It is not intended to diagnose MRSA infection nor to guide or monitor treatment for MRSA infections.   Culture, blood (Routine X 2) w Reflex to ID Panel     Status: None (Preliminary result)   Collection Time: 08/28/15  5:51 PM  Result Value Ref Range Status   Specimen Description BLOOD RIGHT HAND  Final   Special Requests BAA,ANA,AER,5ML  Final   Culture NO GROWTH 3 DAYS  Final   Report Status PENDING  Incomplete  Culture, blood (Routine X 2) w Reflex to ID Panel     Status: None (Preliminary result)   Collection Time: 08/28/15  5:51 PM  Result Value Ref Range Status   Specimen Description BLOOD RIGHT ASSIST CONTROL  Final   Special Requests BAA,ANA,AER,5ML  Final   Culture NO GROWTH 3 DAYS  Final   Report Status PENDING  Incomplete  Culture, respiratory (NON-Expectorated)     Status: None (Preliminary result)   Collection Time: 08/29/15  2:16 PM  Result Value Ref Range Status   Specimen Description TRACHEAL ASPIRATE  Final   Special Requests NONE  Final   Gram Stain FEW WBC SEEN FEW YEAST   Final   Culture Consistent with normal respiratory flora.  Final   Report Status PENDING  Incomplete    Medical History: Past Medical History  Diagnosis Date  . Meniere's disease     Medications:  Scheduled:  . antiseptic oral  rinse  7 mL Mouth Rinse QID  . ceFEPime (MAXIPIME) IV  2 g Intravenous Q12H  . chlorhexidine gluconate (SAGE KIT)  15 mL Mouth Rinse BID  . feeding supplement (PRO-STAT SUGAR FREE 64)  30 mL Oral QID  . free water  30 mL Per Tube 6 times per day  . hydrocortisone sod succinate (SOLU-CORTEF) inj  50 mg Intravenous Q12H  . oseltamivir  30 mg Per Tube Daily  . pantoprazole sodium  40 mg Per Tube Q1200   Infusions:  . dexmedetomidine    . feeding supplement (VITAL 1.5 CAL) 1,000 mL (08/31/15 0600)  . pureflow 2,000 mL/hr at 08/31/15 0322  .  sodium bicarbonate  infusion 1000 mL 50 mL/hr at 08/31/15 0600    Assessment: Pharmacy consulted to assist in adjusting medications for CRRT in this 50 y/o M with rhabdomyolysis. Patient is currently on day 4 of cefepime 2g IV Q12hr.    Cefepime 3/28 >> Vancomycin 3/29 >> 3/29 Oseltamivir 3/28 >> 4/1  Plan:  No further adjustments are necessary at this time.  Pharmacy will continue to monitor and adjust per consult.   John Hoover L 08/31/2015,11:57 AM

## 2015-08-31 NOTE — Progress Notes (Addendum)
[facility] [place] at [facility] : [facility]   PATIENT NAME: Rodrecus Belsky    MR#:  [id number]  DATE OF BIRTH:  [birth year]  SUBJECTIVE:   On vent and off levophed drip. Oliguria, on CRRT. Wife at bedside. Platelet decreased. He had melena last night.  REVIEW OF SYSTEMS:    Unable to obtain.  DRUG ALLERGIES:  No Known Allergies  VITALS:  Blood pressure 89/71, pulse 101, temperature 98.1 F (36.7 C), temperature source Axillary, resp. rate 17, height  (1.854 m), weight 113.2 kg (249 lb 9 oz), SpO2 100 %.  PHYSICAL EXAMINATION:   Physical Exam  GENERAL:  50 y.o.-year-old patient lying in the bed, on vent. EYES: Pupils equal, round, pinpoint. No scleral icterus.  HEENT: Head atraumatic, normocephalic. On intubation.  NECK:  Supple, no jugular venous distention. No thyroid enlargement, no tenderness.  LUNGS: Poor Resp. effort, no wheezing, rales, rhonchi. No use of accessory muscles of respiration.  CARDIOVASCULAR: S1, S2 normal. No murmurs, rubs, or gallops.  ABDOMEN: Soft, nontender, nondistended. Bowel sounds present. No organomegaly or mass.  EXTREMITIES: bilateral lower extremity better cyanosis, weak pedal pulses, warmer skin, no clubbing or edema.   NEUROLOGIC: unable to obtain. PSYCHIATRIC: on vent and sedation. SKIN: No obvious rash, lesion, or ulcer.    LABORATORY PANEL:   CBC  Recent Labs Lab 08/31/15 0447  WBC 18.9*  HGB 11.0*  HCT 32.5*  PLT 26*   ------------------------------------------------------------------------------------------------------------------  Chemistries   Recent Labs Lab 08/31/15 0447 08/31/15 0915  NA 134* 134*  K 3.7 3.8  CL 103 104  CO2 23 24  GLUCOSE 152* 153*  BUN 51* 47*  CREATININE 2.24* 2.12*  CALCIUM 7.4* 7.5*  MG 2.1  --   AST  --  495*  ALT  --  135*  ALKPHOS  --  37*  BILITOT  --  0.9    ------------------------------------------------------------------------------------------------------------------  Cardiac Enzymes  Recent Labs Lab 08/30/15 1543  TROPONINI 0.50*   ------------------------------------------------------------------------------------------------------------------  RADIOLOGY:  US Venous Img Lower Bilateral  08/30/2015  CLINICAL DATA:  Bilateral lower extremity swelling EXAM: BILATERAL LOWER EXTREMITY VENOUS DUPLEX ULTRASOUND TECHNIQUE: Doppler venous assessment of the bilateral lower extremity deep venous system was performed, including characterization of spectral flow, compressibility, and phasicity. COMPARISON:  None. FINDINGS: There is a bandage covering the right inguinal region. Evaluation of the right common femoral vein was not performed. There is complete compressibility of the right femoral and popliteal veins. Doppler analysis demonstrates respiratory phasicity and augmentation of flow. No obvious calf vein thrombosis. There is complete compressibility of the left common femoral, femoral, and popliteal veins. There is a normal-appearing venous waveform with augmentation of flow after calf compression. IMPRESSION: Study was limited. A bandage covered the right inguinal region and evaluation of the right common femoral vein could not be performed. Allowing for this limitation, there is no evidence of DVT in the lower extremities. Electronically Signed   By: Jolaine Click M.D.   On: 08/30/2015 10:13   Dg Abd Portable 1v  08/30/2015  CLINICAL DATA:  Abdominal distention.  Dialysis. EXAM: PORTABLE ABDOMEN - 1 VIEW COMPARISON:  CT abdomen and pelvis 08/28/2015. FINDINGS: The bowel gas pattern is now normal. Previously noted distention of the cecum has resolved. A right femoral dialysis catheter is again noted. The axial skeleton is unremarkable. IMPRESSION: 1. Interval resolution of dilated cecum. 2. Normal bowel gas pattern. 3. Right femoral dialysis catheter.  Electronically Signed   By: Virl Son.D.  On: 08/30/2015 11:23     ASSESSMENT AND PLAN:   50 year old male with past medical history of Mnire's disease who presents to the hospital due to fever, chills, body aches and noted to have influenza.  1. Acute respiratory failure. Continue ventilation, daily SBT as indicated per intensivist.  2. Metabolic Acidosis-actic acidosis.  continue CRRT per Dr. Wynelle LinkKolluru.  2. AMS/Enceophalopathy - due to metabolic encephalopathy from metabolic acidosis.  On sedation and vent.  3. Hypotension/Shock, Sepsis.  off levophed drip,  negative blood, urine cultures so far.  discontinued Vanco, continue Cefepime and IV Solucortef.   4. Acute renal failure - with shock and metabolic acidosis. on bicarb drip. Continue CRRT per Dr. Wynelle LinkKolluru.  5. Thrombocytopenia. Possible due to virus and sepsis. F/u CBC.  GIB with melena. Hb is stable. On protonix.  6.  influenza-the cause of patient's fever chills and body aches. -The supportive care with IV fluids and Tamiflu.  7.  acute rhabdomyolysis-etiology unclear. Nontraumatic. CK still high. Was treated with IV fluids, now on CRRT, follow CKs.  8.  elevated troponin-secondary to the rhabdomyolysis. No evidence of acute coronary syndrome.  Continue supportive care.  9.  Hyponatremia. Improving,  F/u BMP.  I disscussed with Dr. Berna Bueochran, Dr. Dema SeverinMungal and Dr. Wynelle LinkKolluru. All the records are reviewed and case discussed with Care Management/Social Workerr. Management plans discussed with the patient's wife and they are in agreement.  CODE STATUS: Full Code  DVT Prophylaxis: Ted's and SCD's  TOTAL CRITICAL TIME TAKING CARE OF THIS PATIENT: 45 minutes.   POSSIBLE D/C IN >3 DAYS, DEPENDING ON CLINICAL CONDITION.   Shaune Pollackhen, Davelle Anselmi M.D on 08/31/2015 at 11:20 AM  Between 7am to 6pm - Pager - [phone]  After 6pm go to [email or web address] - password [email or web address]  [facility]  Office   [phone]  CC: Primary care physician; Crawford GivensGraham Duncan, MD

## 2015-08-31 NOTE — Consult Note (Signed)
[facility]  Date of admission:  08/27/2015  Inpatient day:  08/31/2015  Consulting physician: Dr. Demetrios Loll  Reason for Consultation:  Thrombocytopenia.  Chief Complaint: John Hoover is a 50 y.o. male with no significant past medical history with acute rhabdomyolysis.  HPI:  The patient is currently intubated.  History is provided by his wife and notes.  He is described as "never sick".  He did not have his influenza vaccine this year.  He presented with a 5 day history of cough, myaagia, and chills.  The patient's wife notes that on Friday, 08/24/2015, he felt hot and cold with chills.  He received a Z-pack and Tessalon Perles.  Over the weekend, he began to hurt all over.  He began taking ibuprofen for pain (unusual for patient as he never complains).  He developed progressive shortness of breath.  Temperature was 102 on several occasions.  She believes that he was dehydrated.  He did not complain of any change in his urine color.    He was seen at [facility] on 08/27/2015 by Dr. Tommi Rumps.  Oxygen saturations were 86%.  He was sent to the emergency room via EMS.  CBC was notable for a hematocrit of 59.7, hemoglobin 19.7, platelets 125,000, WBC 11,000 with an ANC of 8100.  (Labs from 11/06/2010 revealed a hemoglobin of 12.8, platelets 119,000, WBC 3500 with an ANC of 1000).  Creatinine was 1.71 (0.99 on 11/06/2010).  CK was 1,150.  Rapid influenza antigen was negative.  Influenza panel by PCR documented influenza A was positive.  Troponin was 0.25 (0.04-0.49).  He was admitted on 08/27/2015 and begun on hydration.  On 08/28/2015, because of worsening respiratory status, he was transferred to the ICU, intubated, pressors started, CRRT initiated, placed on a bicarb drip, and emperic antibiotics started.    CBC has been followed daily.  CBC on 08/28/2015 revealed a hematocrit of 64.6, hemoglobin 20.6, platelets 77,000, and WBC 14,500.  CBC on 08/29/2015  revealed a hematocrit of 52.5, hemoglobin 17, platelets 59,000, WBC 27,500.  CBC on 08/30/2015 revealed a hematocrit of 41, hemoglobin 13.4, platelets 41,000, and WBC 24,300.  CBC on 08/31/2015 revealed a hematocrit of 36, hemoglobin 11.9, platelets 28,000, WBC 22,400.  Flow cytometry on 08/28/2015 revealed leukocytosis with left shift with down regulation of CD10 and absolute monocytosis with phenotypic aberrancy.  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  There was monocytosis and down regulation of CD11c associated with reactive/activated process as well as neoplastic process.  He has no history of prior MPD/MDS.  Coagulation studies have revealed a PT of 21.2 (INR 1.84) on 08/28/2015 and 15.5 (INR 1.21) on 08/30/2015.  Fibrinogen was 286 on 08/30/2015.  PTT was 34 on 08/31/2015.  CPK has increased daily:  2,831-7,388 on 08/28/2015, 7,210-5,580 on 08/29/2015, 10,420-14,001 on 08/30/2015, and 22,702 on 08/31/2015.  Lactic acid has been elevated at 10.1 on 08/28/2015 and 5.3 on 08/30/2015.  Creatinine was 2.22 on 08/28/2015, 3.01 on 08/28/2015, 2.25-2.76 on 08/29/2015, 2.21-2.52 on 08/30/2015, and 2.12-2.50 on 08/31/2015.  He has had several imaging studies.  Head CT without contrast on 08/28/2015 was negative.  Echo on 08/28/2015 revealed an EF of 60-65%.  Abdomen and pelvic CT without contrast on 08/28/2015 revealed no acute findings with a small volume of ascites, small pericardial effusion, and small bilateral pleural effusions.  Bilateral lower extremity duplex on 08/30/2015 revealed no evidence of DVT.  CXR on 08/31/2015 revealed increased bilateral airspace disease suspicious for pulmonary edema.  Symptomatically, he remains ventilated.  He is off the levophed drip.  He had some melena last evening.  Vancomycin was discontinued.  He remains on cefepime, oseltamir (Tamiflu), and hydrocortisone (50 mg q 12 hours).  He remains on CRRT and a bicarb  drip.   Past Medical History  Diagnosis Date  . Meniere's disease     Past Surgical History  Procedure Laterality Date  . Nasal sinus surgery      Family History  Problem Relation Age of Onset  . Hypertension Mother   . Hypertension Father   . Colon cancer Neg Hx   . Prostate cancer Neg Hx     Social History:  reports that he has never smoked. He has never used smokeless tobacco. He reports that he drinks alcohol. He reports that he does not use illicit drugs.  He has 2 children (age 61 and 61 years old).  The patient is accompanied by his wife.  Allergies: No Known Allergies  Medications Prior to Admission  Medication Sig Dispense Refill  . acetaminophen (TYLENOL) 325 MG tablet Take 650 mg by mouth every 6 (six) hours as needed.    Marland Kitchen azithromycin (ZITHROMAX) 1 g powder Take 1 g by mouth once.    Marland Kitchen ibuprofen (ADVIL,MOTRIN) 200 MG tablet Take 200 mg by mouth every 6 (six) hours as needed.      Review of Systems: Unable to obtain as patient sedated.  Prior to admission he had fever and chills.  He was short of breath with cough.  He had severe pain "all over".  Physical Exam:  Blood pressure 89/71, pulse 117, temperature 100.4 F (38 C), temperature source Axillary, resp. rate 30, height 6' 1" (1.854 m), weight 249 lb 9 oz (113.2 kg), SpO2 98 %.  GENERAL:  Well developed, well nourished, gentleman sedated and intubated in the ICU. HEAD:  Short hair.  Normocephalic, atraumatic, face symmetric, no Cushingoid features. EYES:  Pupils equal round and reactive.  No conjunctivitis or scleral icterus. ENT:  Intubated.  Oropharynx clear without lesion.  Tongue normal.  RESPIRATORY:  Coarse upper airway sounds.  No rales, wheezes or rhonchi. CARDIOVASCULAR:  Regular rate and rhythm without murmur, rub or gallop. ABDOMEN:  Soft, with slightly decreased bowel sounds, and no hepatosplenomegaly.  No masses. SKIN:  No rashes, ulcers or lesions. EXTREMITIES: Diffuse edema.  Feet and hands  cool with mottling. No palpable cords. LYMPH NODES: No palpable cervical, supraclavicular, axillary or inguinal adenopathy  NEUROLOGICAL: Sedated.   Results for orders placed or performed during the hospital encounter of 08/27/15 (from the past 48 hour(s))  Basic metabolic panel     Status: Abnormal   Collection Time: 08/29/15 11:09 PM  Result Value Ref Range   Sodium 133 (L) 135 - 145 mmol/L   Potassium 4.0 3.5 - 5.1 mmol/L   Chloride 103 101 - 111 mmol/L   CO2 22 22 - 32 mmol/L   Glucose, Bld 191 (H) 65 - 99 mg/dL   BUN 40 (H) 6 - 20 mg/dL   Creatinine, Ser 2.25 (H) 0.61 - 1.24 mg/dL   Calcium 6.6 (L) 8.9 - 10.3 mg/dL   GFR calc non Af Amer 32 (L) >60 mL/min   GFR calc Af Amer 37 (L) >60 mL/min    Comment: (NOTE) The eGFR has been calculated using the CKD EPI equation. This calculation has not been validated in all clinical situations. eGFR's persistently <60 mL/min signify possible Chronic Kidney Disease.    Anion gap 8 5 -  15  Basic metabolic panel     Status: Abnormal   Collection Time: 08/30/15  4:33 AM  Result Value Ref Range   Sodium 134 (L) 135 - 145 mmol/L   Potassium 3.8 3.5 - 5.1 mmol/L   Chloride 105 101 - 111 mmol/L   CO2 21 (L) 22 - 32 mmol/L   Glucose, Bld 174 (H) 65 - 99 mg/dL   BUN 45 (H) 6 - 20 mg/dL   Creatinine, Ser 2.21 (H) 0.61 - 1.24 mg/dL   Calcium 6.6 (L) 8.9 - 10.3 mg/dL   GFR calc non Af Amer 33 (L) >60 mL/min   GFR calc Af Amer 38 (L) >60 mL/min    Comment: (NOTE) The eGFR has been calculated using the CKD EPI equation. This calculation has not been validated in all clinical situations. eGFR's persistently <60 mL/min signify possible Chronic Kidney Disease.    Anion gap 8 5 - 15  Magnesium     Status: None   Collection Time: 08/30/15  4:33 AM  Result Value Ref Range   Magnesium 1.9 1.7 - 2.4 mg/dL  Phosphorus     Status: None   Collection Time: 08/30/15  4:33 AM  Result Value Ref Range   Phosphorus 3.3 2.5 - 4.6 mg/dL  CBC     Status:  Abnormal   Collection Time: 08/30/15  4:33 AM  Result Value Ref Range   WBC 24.3 (H) 3.8 - 10.6 K/uL   RBC 4.49 4.40 - 5.90 MIL/uL   Hemoglobin 13.4 13.0 - 18.0 g/dL   HCT 41.0 40.0 - 52.0 %   MCV 91.3 80.0 - 100.0 fL   MCH 29.9 26.0 - 34.0 pg   MCHC 32.8 32.0 - 36.0 g/dL   RDW 15.0 (H) 11.5 - 14.5 %   Platelets 41 (L) 150 - 440 K/uL  Glucose, capillary     Status: Abnormal   Collection Time: 08/30/15  7:33 AM  Result Value Ref Range   Glucose-Capillary 13 (LL) 65 - 99 mg/dL   Comment 1 Notify RN   Glucose, capillary     Status: Abnormal   Collection Time: 08/30/15  7:35 AM  Result Value Ref Range   Glucose-Capillary 152 (H) 65 - 99 mg/dL  Comprehensive metabolic panel     Status: Abnormal   Collection Time: 08/30/15 10:43 AM  Result Value Ref Range   Sodium 132 (L) 135 - 145 mmol/L   Potassium 3.9 3.5 - 5.1 mmol/L   Chloride 104 101 - 111 mmol/L   CO2 21 (L) 22 - 32 mmol/L   Glucose, Bld 177 (H) 65 - 99 mg/dL   BUN 46 (H) 6 - 20 mg/dL   Creatinine, Ser 2.33 (H) 0.61 - 1.24 mg/dL   Calcium 6.4 (LL) 8.9 - 10.3 mg/dL    Comment: CRITICAL RESULT CALLED TO, READ BACK BY AND VERIFIED WITH CALLED  JENNIFER MOTLEY _0  ON 08/30/15 BY HKP    Total Protein 3.4 (L) 6.5 - 8.1 g/dL   Albumin 1.6 (L) 3.5 - 5.0 g/dL   AST 246 (H) 15 - 41 U/L   ALT 74 (H) 17 - 63 U/L   Alkaline Phosphatase 37 (L) 38 - 126 U/L   Total Bilirubin 0.7 0.3 - 1.2 mg/dL   GFR calc non Af Amer 31 (L) >60 mL/min   GFR calc Af Amer 36 (L) >60 mL/min    Comment: (NOTE) The eGFR has been calculated using the CKD EPI equation. This calculation has not been validated  in all clinical situations. eGFR's persistently <60 mL/min signify possible Chronic Kidney Disease.    Anion gap 7 5 - 15  CK     Status: Abnormal   Collection Time: 08/30/15 10:43 AM  Result Value Ref Range   [place] [address] (H) 49 - 397 U/L  Glucose, capillary     Status: Abnormal   Collection Time: 08/30/15 11:38 AM  Result Value Ref Range    Glucose-Capillary 146 (H) 65 - 99 mg/dL  Occult blood card to lab, stool RN will collect     Status: Abnormal   Collection Time: 08/30/15  1:52 PM  Result Value Ref Range   Fecal Occult Bld POSITIVE (A) NEGATIVE  Blood gas, arterial     Status: Abnormal   Collection Time: 08/30/15  3:12 PM  Result Value Ref Range   FIO2 0.30    Delivery systems VENTILATOR    Mode PRESSURE REGULATED VOLUME CONTROL    VT 600 mL   LHR 24 resp/min   Peep/cpap 5.0 cm H20   pH, Arterial 7.45 7.350 - 7.450   pCO2 arterial 31 (L) 32.0 - 48.0 mmHg   pO2, Arterial 63 (L) 83.0 - 108.0 mmHg   Bicarbonate 21.5 21.0 - 28.0 mEq/L   Acid-base deficit 1.7 0.0 - 2.0 mmol/L   O2 Saturation 92.8 %   Patient temperature 37.0    Collection site RIGHT FEMORAL    Sample type ARTERIAL DRAW   Vancomycin, trough     Status: Abnormal   Collection Time: 08/30/15  3:43 PM  Result Value Ref Range   Vancomycin Tr 8 (L) 10 - 20 ug/mL  Lactate dehydrogenase     Status: Abnormal   Collection Time: 08/30/15  3:43 PM  Result Value Ref Range   LDH 668 (H) 98 - 192 U/L  Fibrin Degradation Products ([facility] only)     Status: Abnormal   Collection Time: 08/30/15  3:43 PM  Result Value Ref Range   Fibrin Degradation Prod. >10 AND <40 (A) <10  Protime-INR     Status: Abnormal   Collection Time: 08/30/15  3:43 PM  Result Value Ref Range   Prothrombin Time 15.5 (H) 11.4 - 15.0 seconds   INR 1.21   Lactic acid, plasma     Status: Abnormal   Collection Time: 08/30/15  3:43 PM  Result Value Ref Range   Lactic Acid, Venous 5.3 (HH) 0.5 - 2.0 mmol/L    Comment: CRITICAL RESULT CALLED TO, READ BACK BY AND VERIFIED WITH STACEY COLLIE AT 1643 08/30/2015 BY TFK   Fibrinogen     Status: None   Collection Time: 08/30/15  3:43 PM  Result Value Ref Range   Fibrinogen 286 210 - 470 mg/dL  CBC with Differential/Platelet     Status: Abnormal   Collection Time: 08/30/15  3:43 PM  Result Value Ref Range   WBC 23.4 (H) 3.8 - 10.6 K/uL   RBC  4.03 (L) 4.40 - 5.90 MIL/uL   Hemoglobin 11.9 (L) 13.0 - 18.0 g/dL   HCT 36.0 (L) 40.0 - 52.0 %   MCV 89.3 80.0 - 100.0 fL   MCH 29.5 26.0 - 34.0 pg   MCHC 33.1 32.0 - 36.0 g/dL   RDW 14.7 (H) 11.5 - 14.5 %   Platelets 28 (LL) 150 - 440 K/uL    Comment: PLATELET COUNT CONFIRMED BY SMEAR CRITICAL RESULT CALLED TO, READ BACK BY AND VERIFIED WITH: STACY HOLLY AT 1703 08/30/15 MLZ     Neutrophils  Relative % 78 %   Lymphocytes Relative 5 %   Monocytes Relative 2 %   Eosinophils Relative 0 %   Basophils Relative 0 %   Band Neutrophils 15 %   Metamyelocytes Relative 0 %   Myelocytes 0 %   Promyelocytes Absolute 0 %   Blasts 0 %   nRBC 0 0 /100 WBC   Other 0 %   Neutro Abs 21.7 (H) 1.4 - 6.5 K/uL   Lymphs Abs 1.2 1.0 - 3.6 K/uL   Monocytes Absolute 0.5 0.2 - 1.0 K/uL   Eosinophils Absolute 0.0 0 - 0.7 K/uL   Basophils Absolute 0.0 0 - 0.1 K/uL   RBC Morphology MIXED RBC POPULATION   Troponin I     Status: Abnormal   Collection Time: 08/30/15  3:43 PM  Result Value Ref Range   Troponin I 0.50 (H) <0.031 ng/mL    Comment: READ BACK AND VERIFIED WITH STACEY COLLIE AT 1644 08/30/2015 BY TFK        POSSIBLE MYOCARDIAL ISCHEMIA. SERIAL TESTING RECOMMENDED.   CK     Status: Abnormal   Collection Time: 08/30/15  3:43 PM  Result Value Ref Range   [place] [address] (H) 49 - 397 U/L    Comment: RESULTS CONFIRMED BY MANUAL DILUTION  CKMB([facility] only)     Status: Abnormal   Collection Time: 08/30/15  3:43 PM  Result Value Ref Range   CK, MB 144.6 (H) 0.5 - 5.0 ng/mL  Comprehensive metabolic panel     Status: Abnormal   Collection Time: 08/30/15  3:43 PM  Result Value Ref Range   Sodium 132 (L) 135 - 145 mmol/L   Potassium 4.0 3.5 - 5.1 mmol/L   Chloride 101 101 - 111 mmol/L   CO2 23 22 - 32 mmol/L   Glucose, Bld 139 (H) 65 - 99 mg/dL   BUN 51 (H) 6 - 20 mg/dL   Creatinine, Ser 2.52 (H) 0.61 - 1.24 mg/dL   Calcium 6.8 (L) 8.9 - 10.3 mg/dL   Total Protein 3.7 (L) 6.5 - 8.1 g/dL    Albumin 2.0 (L) 3.5 - 5.0 g/dL   AST 296 (H) 15 - 41 U/L   ALT 87 (H) 17 - 63 U/L   Alkaline Phosphatase 34 (L) 38 - 126 U/L   Total Bilirubin 0.8 0.3 - 1.2 mg/dL   GFR calc non Af Amer 28 (L) >60 mL/min   GFR calc Af Amer 32 (L) >60 mL/min    Comment: (NOTE) The eGFR has been calculated using the CKD EPI equation. This calculation has not been validated in all clinical situations. eGFR's persistently <60 mL/min signify possible Chronic Kidney Disease.    Anion gap 8 5 - 15  Glucose, capillary     Status: Abnormal   Collection Time: 08/30/15  4:05 PM  Result Value Ref Range   Glucose-Capillary 129 (H) 65 - 99 mg/dL  Glucose, capillary     Status: Abnormal   Collection Time: 08/30/15  8:06 PM  Result Value Ref Range   Glucose-Capillary 127 (H) 65 - 99 mg/dL  Basic metabolic panel     Status: Abnormal   Collection Time: 08/30/15 10:41 PM  Result Value Ref Range   Sodium 133 (L) 135 - 145 mmol/L   Potassium 3.6 3.5 - 5.1 mmol/L   Chloride 102 101 - 111 mmol/L   CO2 24 22 - 32 mmol/L   Glucose, Bld 159 (H) 65 - 99 mg/dL   BUN 52 (  H) 6 - 20 mg/dL   Creatinine, Ser 2.29 (H) 0.61 - 1.24 mg/dL   Calcium 6.9 (L) 8.9 - 10.3 mg/dL   GFR calc non Af Amer 31 (L) >60 mL/min   GFR calc Af Amer 36 (L) >60 mL/min    Comment: (NOTE) The eGFR has been calculated using the CKD EPI equation. This calculation has not been validated in all clinical situations. eGFR's persistently <60 mL/min signify possible Chronic Kidney Disease.    Anion gap 7 5 - 15  Glucose, capillary     Status: Abnormal   Collection Time: 08/31/15 12:05 AM  Result Value Ref Range   Glucose-Capillary 136 (H) 65 - 99 mg/dL  Basic metabolic panel     Status: Abnormal   Collection Time: 08/31/15  4:47 AM  Result Value Ref Range   Sodium 134 (L) 135 - 145 mmol/L   Potassium 3.7 3.5 - 5.1 mmol/L   Chloride 103 101 - 111 mmol/L   CO2 23 22 - 32 mmol/L   Glucose, Bld 152 (H) 65 - 99 mg/dL   BUN 51 (H) 6 - 20 mg/dL    Creatinine, Ser 2.24 (H) 0.61 - 1.24 mg/dL   Calcium 7.4 (L) 8.9 - 10.3 mg/dL   GFR calc non Af Amer 32 (L) >60 mL/min   GFR calc Af Amer 37 (L) >60 mL/min    Comment: (NOTE) The eGFR has been calculated using the CKD EPI equation. This calculation has not been validated in all clinical situations. eGFR's persistently <60 mL/min signify possible Chronic Kidney Disease.    Anion gap 8 5 - 15  Magnesium     Status: None   Collection Time: 08/31/15  4:47 AM  Result Value Ref Range   Magnesium 2.1 1.7 - 2.4 mg/dL  Phosphorus     Status: Abnormal   Collection Time: 08/31/15  4:47 AM  Result Value Ref Range   Phosphorus 2.3 (L) 2.5 - 4.6 mg/dL  Reticulocytes     Status: Abnormal   Collection Time: 08/31/15  4:47 AM  Result Value Ref Range   Retic Ct Pct 0.6 0.4 - 3.1 %   RBC. 3.65 (L) 4.40 - 5.90 MIL/uL   Retic Count, Manual 21.9 19.0 - 183.0 K/uL  APTT     Status: None   Collection Time: 08/31/15  4:47 AM  Result Value Ref Range   aPTT 34 24 - 36 seconds  CBC with Differential/Platelet     Status: Abnormal   Collection Time: 08/31/15  4:47 AM  Result Value Ref Range   WBC 18.9 (H) 3.8 - 10.6 K/uL   RBC 3.65 (L) 4.40 - 5.90 MIL/uL   Hemoglobin 11.0 (L) 13.0 - 18.0 g/dL   HCT 32.5 (L) 40.0 - 52.0 %   MCV 89.0 80.0 - 100.0 fL   MCH 30.0 26.0 - 34.0 pg   MCHC 33.7 32.0 - 36.0 g/dL   RDW 15.0 (H) 11.5 - 14.5 %   Platelets 26 (LL) 150 - 440 K/uL    Comment: CRITICAL VALUE NOTED.  VALUE IS CONSISTENT WITH PREVIOUSLY REPORTED AND CALLED VALUE.   Neutrophils Relative % 88% %   Neutro Abs 16.6 (H) 1.4 - 6.5 K/uL   Lymphocytes Relative 4% %   Lymphs Abs 0.8 (L) 1.0 - 3.6 K/uL   Monocytes Relative 8% %   Monocytes Absolute 1.4 (H) 0.2 - 1.0 K/uL   Eosinophils Relative 0% %   Eosinophils Absolute 0.0 0 - 0.7 K/uL   Basophils  Relative 0% %   Basophils Absolute 0.0 0 - 0.1 K/uL  Glucose, capillary     Status: Abnormal   Collection Time: 08/31/15  7:33 AM  Result Value Ref Range    Glucose-Capillary 130 (H) 65 - 99 mg/dL  Basic metabolic panel     Status: Abnormal   Collection Time: 08/31/15  9:15 AM  Result Value Ref Range   Sodium 134 (L) 135 - 145 mmol/L   Potassium 3.8 3.5 - 5.1 mmol/L   Chloride 104 101 - 111 mmol/L   CO2 24 22 - 32 mmol/L   Glucose, Bld 153 (H) 65 - 99 mg/dL   BUN 47 (H) 6 - 20 mg/dL   Creatinine, Ser 2.12 (H) 0.61 - 1.24 mg/dL   Calcium 7.5 (L) 8.9 - 10.3 mg/dL   GFR calc non Af Amer 34 (L) >60 mL/min   GFR calc Af Amer 40 (L) >60 mL/min    Comment: (NOTE) The eGFR has been calculated using the CKD EPI equation. This calculation has not been validated in all clinical situations. eGFR's persistently <60 mL/min signify possible Chronic Kidney Disease.    Anion gap 6 5 - 15  CK     Status: Abnormal   Collection Time: 08/31/15  9:15 AM  Result Value Ref Range   Total CK [address]02 (H) 49 - 397 U/L    Comment: RESULT CONFIRMED BY MANUAL DILUTION  Hepatic function panel     Status: Abnormal   Collection Time: 08/31/15  9:15 AM  Result Value Ref Range   Total Protein 4.4 (L) 6.5 - 8.1 g/dL   Albumin 2.9 (L) 3.5 - 5.0 g/dL   AST 495 (H) 15 - 41 U/L   ALT 135 (H) 17 - 63 U/L   Alkaline Phosphatase 37 (L) 38 - 126 U/L   Total Bilirubin 0.9 0.3 - 1.2 mg/dL   Bilirubin, Direct 0.3 0.1 - 0.5 mg/dL   Indirect Bilirubin 0.6 0.3 - 0.9 mg/dL  Amylase     Status: None   Collection Time: 08/31/15  9:15 AM  Result Value Ref Range   Amylase 29 28 - 100 U/L  Lipase, blood     Status: None   Collection Time: 08/31/15  9:15 AM  Result Value Ref Range   Lipase 11 11 - 51 U/L  Blood gas, arterial     Status: Abnormal   Collection Time: 08/31/15  9:30 AM  Result Value Ref Range   FIO2 30.00    Mode PRESSURE REGULATED VOLUME CONTROL    VT 600 mL   LHR 20 resp/min   Peep/cpap 5.0 cm H20   pH, Arterial 7.51 (H) 7.350 - 7.450   pCO2 arterial 32 32.0 - 48.0 mmHg   pO2, Arterial 93 83.0 - 108.0 mmHg   Bicarbonate 25.5 21.0 - 28.0 mEq/L   Acid-Base  Excess 2.8 0.0 - 3.0 mmol/L   O2 Saturation 97.9 %   Patient temperature 37.0    Collection site A-LINE DRAW    Sample type ARTERIAL DRAW    Mechanical Rate 20   Glucose, capillary     Status: Abnormal   Collection Time: 08/31/15 11:36 AM  Result Value Ref Range   Glucose-Capillary 128 (H) 65 - 99 mg/dL  Comprehensive metabolic panel     Status: Abnormal   Collection Time: 08/31/15  5:35 PM  Result Value Ref Range   Sodium 133 (L) 135 - 145 mmol/L   Potassium 3.6 3.5 - 5.1 mmol/L   Chloride  102 101 - 111 mmol/L   CO2 27 22 - 32 mmol/L   Glucose, Bld 122 (H) 65 - 99 mg/dL   BUN 55 (H) 6 - 20 mg/dL   Creatinine, Ser 2.50 (H) 0.61 - 1.24 mg/dL   Calcium 7.2 (L) 8.9 - 10.3 mg/dL   Total Protein 4.0 (L) 6.5 - 8.1 g/dL   Albumin 2.3 (L) 3.5 - 5.0 g/dL   AST 497 (H) 15 - 41 U/L   ALT 147 (H) 17 - 63 U/L   Alkaline Phosphatase 38 38 - 126 U/L   Total Bilirubin 0.7 0.3 - 1.2 mg/dL   GFR calc non Af Amer 28 (L) >60 mL/min   GFR calc Af Amer 33 (L) >60 mL/min    Comment: (NOTE) The eGFR has been calculated using the CKD EPI equation. This calculation has not been validated in all clinical situations. eGFR's persistently <60 mL/min signify possible Chronic Kidney Disease.    Anion gap 4 (L) 5 - 15   US Venous Img Lower Bilateral  08/30/2015  CLINICAL DATA:  Bilateral lower extremity swelling EXAM: BILATERAL LOWER EXTREMITY VENOUS DUPLEX ULTRASOUND TECHNIQUE: Doppler venous assessment of the bilateral lower extremity deep venous system was performed, including characterization of spectral flow, compressibility, and phasicity. COMPARISON:  None. FINDINGS: There is a bandage covering the right inguinal region. Evaluation of the right common femoral vein was not performed. There is complete compressibility of the right femoral and popliteal veins. Doppler analysis demonstrates respiratory phasicity and augmentation of flow. No obvious calf vein thrombosis. There is complete compressibility of  the left common femoral, femoral, and popliteal veins. There is a normal-appearing venous waveform with augmentation of flow after calf compression. IMPRESSION: Study was limited. A bandage covered the right inguinal region and evaluation of the right common femoral vein could not be performed. Allowing for this limitation, there is no evidence of DVT in the lower extremities. Electronically Signed   By: Marybelle Killings M.D.   On: 08/30/2015 10:13   Dg Chest Port 1 View  08/31/2015  CLINICAL DATA:  Acute respiratory failure. On ventilator. Acute renal failure. Hypovolemic shock. EXAM: PORTABLE CHEST 1 VIEW COMPARISON:  08/28/2015 FINDINGS: Endotracheal tube, left subclavian central venous catheter and nasogastric tube remain in appropriate position. No pneumothorax visualized. Patient is rotated to the right. Increased bilateral airspace disease is seen with central perihilar predominance suspicious for pulmonary edema, although infection cannot definitely be excluded. Small bilateral pleural effusions cannot be excluded on this portable exam. Heart size remains stable. IMPRESSION: Increased bilateral airspace disease, suspicious for pulmonary edema, although infection cannot be excluded. Electronically Signed   By: Earle Gell M.D.   On: 08/31/2015 12:05   Dg Abd Portable 1v  08/30/2015  CLINICAL DATA:  Abdominal distention.  Dialysis. EXAM: PORTABLE ABDOMEN - 1 VIEW COMPARISON:  CT abdomen and pelvis 08/28/2015. FINDINGS: The bowel gas pattern is now normal. Previously noted distention of the cecum has resolved. A right femoral dialysis catheter is again noted. The axial skeleton is unremarkable. IMPRESSION: 1. Interval resolution of dilated cecum. 2. Normal bowel gas pattern. 3. Right femoral dialysis catheter. Electronically Signed   By: San Morelle M.D.   On: 08/30/2015 11:23    Assessment:  The patient is a 50 y.o.  gentleman with influenza A complicated by viral myositis with rhabdomyolysis.   He developed respiratory distress and has been  intubated.   Associated with rhabdomyolysis, he developed acute renal failure and is on CRRT.  He initially  had an elevated hematocrit likely due to dehydration (no prior history of myeloproliferative disorder).  He has lactic acidosis and is on a bicarb drip.  Pressors have been weaned off.  He has had a progressive decline in his platelet count likely due to sepsis.  Doubt heparin induced thrombocytopenia or TTP.  PT, PTT, and fibrinogen are normal.  He had some melena last evening.  Hemoglobin is 11-11.9 (baseline 12.8 on 11/06/2010).  Plan:   1.  Peripheral smear for path review. 2.  Labs:  PTT, HIT assay, serotonin release assay, haptoglobin, plasma free hemoglobin, retic count, and ANA. 3.  Urine for myoglobin. 4.  Continue supportive care.  Thank you for allowing me to participate in John Hoover 's care.  I will follow him closely with you while hospitalized and after discharge in the outpatient department.   Lequita Asal, MD  08/31/2015, 10:49 PM

## 2015-08-31 NOTE — Progress Notes (Signed)
Nutrition Follow-up    INTERVENTION:   EN: recommend continuing current TF regimen at present (Vital 1.5 at 50 ml/hr with Prostat QID)  NUTRITION DIAGNOSIS:   Inadequate oral intake related to acute illness as evidenced by NPO status.  GOAL:   Provide needs based on ASPEN/SCCM guidelines  MONITOR:   TF tolerance, Vent status, Labs, I & O's, Weight trends  REASON FOR ASSESSMENT:   Ventilator    ASSESSMENT:    Pt remains anuric, on CRRT with UF 50 ml/hr, platelet count decreased, some melena last night, Hgb stable  Patient is currently intubated on ventilator support, sedated MV: 9.9 L/min Temp (24hrs), Avg:98.4 F (36.9 C), Min:98 F (36.7 C), Max:99 F (37.2 C)   Diet Order:   NPO  EN: tolerating Vital 1.5 at rate of 50 ml/hr with Prostat QID, 30 mL free water q 4 hours  Skin:  Reviewed, no issues  Last BM:  08/31/15 loose stool black/brown    Recent Labs Lab 08/29/15 0439  08/30/15 0433  08/30/15 2241 08/31/15 0447 08/31/15 0915  NA 132*  < > 134*  < > 133* 134* 134*  K 4.6  < > 3.8  < > 3.6 3.7 3.8  CL 102  < > 105  < > 102 103 104  CO2 20*  < > 21*  < > BUN 50*  < > 45*  < > 52* 51* 47*  CREATININE 2.48*  < > 2.21*  < > 2.29* 2.24* 2.12*  CALCIUM 7.0*  < > 6.6*  < > 6.9* 7.4* 7.5*  MG 1.8  --  1.9  --   --  2.1  --   PHOS 5.6*  --  3.3  --   --  2.3*  --   GLUCOSE 154*  < > 174*  < > 159* 152* 153*  < > = values in this interval not displayed.  Nutritional Anemia Profile:  CBC Latest Ref Rng 08/31/2015 08/30/2015 08/30/2015  WBC 3.8 - 10.6 K/uL 18.9(H) 23.4(H) 24.3(H)  Hemoglobin 13.0 - 18.0 g/dL 11.0(L) 11.9(L) 13.4  Hematocrit 40.0 - 52.0 % 32.5(L) 36.0(L) 41.0  Platelets 150 - 440 K/uL 26(LL) 28(LL) 41(L)    Meds: precedex, sodium bicarb at 50 ml/hr (204 kcals)  Height:   Ht Readings from Last 1 Encounters:  08/28/15  (1.854 m)    Weight: significant wt gain in 1-2 days per wt trend, noted pt also with generalized  edema, continue to use wt from 08/29/15 at present time as this correlates best with wt history  Wt Readings from Last 1 Encounters:  08/31/15 249 lb 9 oz (113.2 kg)    Filed Weights   08/29/15 0500 08/30/15 0500 08/31/15 0445  Weight: 217 lb 6 oz (98.6 kg) 241 lb 10 oz (109.6 kg) 249 lb 9 oz (113.2 kg)   Wt Readings from Last 10 Encounters:  08/31/15 249 lb 9 oz (113.2 kg)  08/27/15 202 lb 9.6 oz (91.899 kg)  02/15/15 190 lb 12.8 oz (86.546 kg)  08/22/14 197 lb 12 oz (89.699 kg)  05/18/14 189 lb 12 oz (86.07 kg)  08/25/11 206 lb (93.441 kg)  04/07/11 200 lb 1.9 oz (90.774 kg)  11/04/10 185 lb 8 oz (84.142 kg)  02/14/09 186 lb (84.369 kg)  06/29/08 196 lb (88.905 kg)    BMI:  Body mass index is 32.93 kg/(m^2).  Estimated Nutritional Needs:   Kcal:  2122 kcals (Ve: 9.9, Tmax: 37.2) using wt of  98.6 kg  Protein:  148-197 g (1.5-2.0 g/kg)   Fluid:  2L per day  EDUCATION NEEDS:   Education needs no appropriate at this time  Romelle StarcherCate Onur Mori MS, RD, LDN [phone] Pager  [phone]) [phone] Weekend/On-Call Pager

## 2015-08-31 NOTE — Progress Notes (Signed)
[facility]  ROUNDING NOTE   Subjective:   Wife at bedside Remains on CRRT UF 46m/hr  Norepinephrine weaned this morning.  CVP 12 Bicarb gtt at 557mhr  Objective:  Vital signs in last 24 hours:  Temp:  [98.1 F (36.7 C)-99.3 F (37.4 C)] 98.1 F (36.7 C) (03/31 0800) Pulse Rate:  [91-125] 106 (03/31 0800) Resp:  [17-32] 24 (03/31 0800) BP: (89)/(71) 89/71 mmHg (03/30 1000) SpO2:  [98 %-100 %] 100 % (03/31 0842) Arterial Line BP: (71-135)/(38-77) 128/75 mmHg (03/31 0800) FiO2 (%):  [30 %] 30 % (03/31 0842) Weight:  [113.2 kg (249 lb 9 oz)] 113.2 kg (249 lb 9 oz) (03/31 0445)  Weight change: 3.6 kg (7 lb 15 oz) Filed Weights   08/29/15 0500 08/30/15 0500 08/31/15 0445  Weight: 98.6 kg (217 lb 6 oz) 109.6 kg (241 lb 10 oz) 113.2 kg (249 lb 9 oz)    Intake/Output: I/O last 3 completed shifts: In: 7103.8 [I.V.:2477.9; NG/GT:1875.9; IV Piggyback:2750] Out: 8952Urine:216; Other:675]   Intake/Output this shift:  Total I/O In: 105 [I.V.:55; NG/GT:50] Out: -   Physical Exam: General: Critically ill   Head: +ETT +OGT  Eyes: Eyes closed, PERRL  Neck:  trachea midline, left subclavian triple lumen  Lungs:  Clear to auscultation, PRVC FiO2 30%  Heart: tachycardia  Abdomen:  Soft, nontender  Extremities: +cyanosis +mottling   Neurologic: Sedated, intubated.   Skin: No lesions  Access: Right femoral temp HD catheter 3/28 Dr. MuStevenson Clinch  Basic Metabolic Panel:  Recent Labs Lab 08/27/15 1827  08/28/15 1721  08/28/15 2133  08/29/15 04010203/30/17 [id number]/30/17 1043 08/30/15 1543 08/30/15 2241 08/31/15 0447  NA 132*  < >  --   < > 131*  < > 132*  < > 134* 132* 132* 133* 134*  K 5.2*  < >  --   < > 4.6  < > 4.6  < > 3.8 3.9 4.0 3.6 3.7  CL 105  < >  --   < > 101  < > 102  < > 105 104 101 102 103  CO2 14*  < >  --   < > 15*  < > 20*  < > 21* 21* 23 24 23   GLUCOSE 116*  < >  --   < > 194*  < > 154*  < > 174* 177* 139* 159* 152*  BUN 28*  < >  --   < >  60*  < > 50*  < > 45* 46* 51* 52* 51*  CREATININE 1.56*  < >  --   < > 3.00*  < > 2.48*  < > 2.21* 2.33* 2.52* 2.29* 2.24*  CALCIUM 7.5*  < >  --   < > 6.9*  < > 7.0*  < > 6.6* 6.4* 6.8* 6.9* 7.4*  MG 1.8  --   --   --  2.0  --  1.8  --  1.9  --   --   --  2.1  PHOS  --   --  11.4*  --   --   --  5.6*  --  3.3  --   --   --  2.3*  < > = values in this interval not displayed.  Liver Function Tests:  Recent Labs Lab 08/27/15 1221 08/28/15 1724 08/30/15 1043 08/30/15 1543  AST 46* 133* 246* 296*  ALT 20 35 74* 87*  ALKPHOS 39 29* 37* 34*  BILITOT 0.7  0.4 0.7 0.8  PROT 5.9* 3.7* 3.4* 3.7*  ALBUMIN 3.2* 1.8* 1.6* 2.0*   No results for input(s): LIPASE, AMYLASE in the last 168 hours. No results for input(s): AMMONIA in the last 168 hours.  CBC:  Recent Labs Lab 08/27/15 1221 08/28/15 0539 08/29/15 0439 08/30/15 0433 08/30/15 1543 08/31/15 0447  WBC 11.0* 14.5* 27.5* 24.3* 23.4* 18.9*  NEUTROABS 8.1*  --   --   --  21.7* 16.6*  HGB 19.7* 20.6* 17.0 13.4 11.9* 11.0*  HCT 59.7* 64.6* 52.5* 41.0 36.0* 32.5*  MCV 90.1 92.4 91.6 91.3 89.3 89.0  PLT 125* 77* 59* 41* 28* 26*    Cardiac Enzymes:  Recent Labs Lab 08/28/15 0007  08/28/15 1721 08/28/15 1724 08/28/15 2133 08/29/15 0439 08/29/15 1642 08/30/15 1043 08/30/15 1543  CKTOTAL  --   < >  --  7388*  --  7210* 5580* 10420* 14001*  CKMB  --   --   --   --   --   --   --   --  144.6*  TROPONINI 0.33*  --  0.44*  --  0.41* 0.36*  --   --  0.50*  < > = values in this interval not displayed.  BNP: Invalid input(s): POCBNP  CBG:  Recent Labs Lab 08/30/15 1138 08/30/15 1605 08/30/15 2006 08/31/15 0005 08/31/15 0733  GLUCAP 146* 129* 127* 136* 130*    Microbiology: Results for orders placed or performed during the hospital encounter of 08/27/15  Rapid Influenza A&B Antigens ([facility] only)     Status: None   Collection Time: 08/27/15 12:22 PM  Result Value Ref Range Status   Influenza A ([place]) NEGATIVE NEGATIVE  Final   Influenza B ([facility]) NEGATIVE NEGATIVE Final  Urine culture     Status: None   Collection Time: 08/27/15  7:40 PM  Result Value Ref Range Status   Specimen Description URINE, RANDOM  Final   Special Requests Normal  Final   Culture NO GROWTH 1 DAY  Final   Report Status 08/29/2015 FINAL  Final  MRSA PCR Screening     Status: None   Collection Time: 08/28/15  5:25 PM  Result Value Ref Range Status   MRSA by PCR NEGATIVE NEGATIVE Final    Comment:        The GeneXpert MRSA Assay (FDA approved for NASAL specimens only), is one component of a comprehensive MRSA colonization surveillance program. It is not intended to diagnose MRSA infection nor to guide or monitor treatment for MRSA infections.   Culture, blood (Routine X 2) w Reflex to ID Panel     Status: None (Preliminary result)   Collection Time: 08/28/15  5:51 PM  Result Value Ref Range Status   Specimen Description BLOOD RIGHT HAND  Final   Special Requests BAA,ANA,AER,5ML  Final   Culture NO GROWTH 3 DAYS  Final   Report Status PENDING  Incomplete  Culture, blood (Routine X 2) w Reflex to ID Panel     Status: None (Preliminary result)   Collection Time: 08/28/15  5:51 PM  Result Value Ref Range Status   Specimen Description BLOOD RIGHT ASSIST CONTROL  Final   Special Requests BAA,ANA,AER,5ML  Final   Culture NO GROWTH 3 DAYS  Final   Report Status PENDING  Incomplete  Culture, respiratory (NON-Expectorated)     Status: None (Preliminary result)   Collection Time: 08/29/15  2:16 PM  Result Value Ref Range Status   Specimen Description TRACHEAL ASPIRATE  Final  Special Requests NONE  Final   Gram Stain FEW WBC SEEN FEW YEAST   Final   Culture TOO YOUNG TO READ  Final   Report Status PENDING  Incomplete    Coagulation Studies:  Recent Labs  08/28/15 1724 08/30/15 1543  LABPROT 21.2* 15.5*  INR 1.84 1.21    Urinalysis: No results for input(s): COLORURINE, LABSPEC, PHURINE, GLUCOSEU, HGBUR,  BILIRUBINUR, KETONESUR, PROTEINUR, UROBILINOGEN, NITRITE, LEUKOCYTESUR in the last 72 hours.  Invalid input(s): APPERANCEUR    Imaging: US Venous Img Lower Bilateral  08/30/2015  CLINICAL DATA:  Bilateral lower extremity swelling EXAM: BILATERAL LOWER EXTREMITY VENOUS DUPLEX ULTRASOUND TECHNIQUE: Doppler venous assessment of the bilateral lower extremity deep venous system was performed, including characterization of spectral flow, compressibility, and phasicity. COMPARISON:  None. FINDINGS: There is a bandage covering the right inguinal region. Evaluation of the right common femoral vein was not performed. There is complete compressibility of the right femoral and popliteal veins. Doppler analysis demonstrates respiratory phasicity and augmentation of flow. No obvious calf vein thrombosis. There is complete compressibility of the left common femoral, femoral, and popliteal veins. There is a normal-appearing venous waveform with augmentation of flow after calf compression. IMPRESSION: Study was limited. A bandage covered the right inguinal region and evaluation of the right common femoral vein could not be performed. Allowing for this limitation, there is no evidence of DVT in the lower extremities. Electronically Signed   By: Marybelle Killings M.D.   On: 08/30/2015 10:13   Dg Abd Portable 1v  08/30/2015  CLINICAL DATA:  Abdominal distention.  Dialysis. EXAM: PORTABLE ABDOMEN - 1 VIEW COMPARISON:  CT abdomen and pelvis 08/28/2015. FINDINGS: The bowel gas pattern is now normal. Previously noted distention of the cecum has resolved. A right femoral dialysis catheter is again noted. The axial skeleton is unremarkable. IMPRESSION: 1. Interval resolution of dilated cecum. 2. Normal bowel gas pattern. 3. Right femoral dialysis catheter. Electronically Signed   By: San Morelle M.D.   On: 08/30/2015 11:23     Medications:   . feeding supplement (VITAL 1.5 CAL) 1,000 mL (08/31/15 0600)  . midazolam  (VERSED) infusion 5 mg/hr (08/31/15 0600)  . norepinephrine (LEVOPHED) Adult infusion Stopped (08/31/15 0700)  . pureflow 2,000 mL/hr at 08/31/15 0322  .  sodium bicarbonate  infusion 1000 mL 50 mL/hr at 08/31/15 0600   . albumin human  25 g Intravenous Q6H  . antiseptic oral rinse  7 mL Mouth Rinse QID  . ceFEPime (MAXIPIME) IV  2 g Intravenous Q12H  . chlorhexidine gluconate (SAGE KIT)  15 mL Mouth Rinse BID  . feeding supplement (PRO-STAT SUGAR FREE 64)  30 mL Oral QID  . free water  30 mL Per Tube 6 times per day  . hydrocortisone sod succinate (SOLU-CORTEF) inj  50 mg Intravenous Q6H  . insulin aspart  0-15 Units Subcutaneous TID WC  . oseltamivir  30 mg Per Tube Daily  . pantoprazole (PROTONIX) IV  40 mg Intravenous Daily  . senna-docusate  1 tablet Oral BID   acetaminophen **OR** acetaminophen, albuterol, fentaNYL (SUBLIMAZE) injection, heparin, midazolam, [DISCONTINUED] ondansetron **OR** ondansetron (ZOFRAN) IV, sodium chloride, sodium chloride flush  Assessment/ Plan:  John Hoover is a 50 y.o. white male with no specific past medical history, who was admitted to [facility] on 08/27/2015  1. Acute Renal Failure 2. Metabolic Acidosis 3. Rhabdomyolysis 4. Acute respiratory failure 5. Influenza A positive 6. Hypoalbuminemia  Plan Patient with anuric urine output. Continue CRRT- will  take off if remains hemodynamically stable through the morning. Pending new ABG and CK Continue bicarb gtt due rising CK with rhabdomyolysis.  Continue IV albumin for another 24-48 hours.    LOS: [place], [place]den 3/31/20179:03 AM

## 2015-09-01 ENCOUNTER — Inpatient Hospital Stay: Payer: BLUE CROSS/BLUE SHIELD

## 2015-09-01 DIAGNOSIS — Z978 Presence of other specified devices: Secondary | ICD-10-CM | POA: Insufficient documentation

## 2015-09-01 DIAGNOSIS — IMO0002 Reserved for concepts with insufficient information to code with codable children: Secondary | ICD-10-CM | POA: Diagnosis present

## 2015-09-01 DIAGNOSIS — D696 Thrombocytopenia, unspecified: Secondary | ICD-10-CM | POA: Diagnosis not present

## 2015-09-01 LAB — BLOOD GAS, ARTERIAL
Acid-Base Excess: 5.5 mmol/L — ABNORMAL HIGH (ref 0.0–3.0)
Acid-Base Excess: 5.6 mmol/L — ABNORMAL HIGH (ref 0.0–3.0)
Allens test (pass/fail): POSITIVE — AB
BICARBONATE: 28.2 meq/L — AB (ref 21.0–28.0)
Bicarbonate: 27.2 mEq/L (ref 21.0–28.0)
FIO2: 0.8
FIO2: 0.4
MECHANICAL RATE: 14
O2 SAT: 96.5 %
O2 Saturation: 97.2 %
PATIENT TEMPERATURE: 37
PCO2 ART: 29 mmHg — AB (ref 32.0–48.0)
PCO2 ART: 33 mmHg (ref 32.0–48.0)
PEEP: 5 cmH2O
PEEP: 5 cmH2O
PO2 ART: 75 mmHg — AB (ref 83.0–108.0)
PRESSURE SUPPORT: 10 cmH2O
Patient temperature: 37
VT: 600 mL
pH, Arterial: 7.58 — ABNORMAL HIGH (ref 7.350–7.450)
pH, Arterial: 7.54 — ABNORMAL HIGH (ref 7.350–7.450)
pO2, Arterial: 78 mmHg — ABNORMAL LOW (ref 83.0–108.0)

## 2015-09-01 LAB — COMPREHENSIVE METABOLIC PANEL
ALK PHOS: 48 U/L (ref 38–126)
ALT: 148 U/L — AB (ref 17–63)
AST: 442 U/L — AB (ref 15–41)
Albumin: 2.2 g/dL — ABNORMAL LOW (ref 3.5–5.0)
Anion gap: 4 — ABNORMAL LOW (ref 5–15)
BILIRUBIN TOTAL: 0.7 mg/dL (ref 0.3–1.2)
BUN: 66 mg/dL — ABNORMAL HIGH (ref 6–20)
CALCIUM: 7.1 mg/dL — AB (ref 8.9–10.3)
CHLORIDE: 102 mmol/L (ref 101–111)
CO2: 29 mmol/L (ref 22–32)
CREATININE: 3.29 mg/dL — AB (ref 0.61–1.24)
GFR calc Af Amer: 23 mL/min — ABNORMAL LOW (ref >60)
GFR, EST NON AFRICAN AMERICAN: 20 mL/min — AB (ref >60)
Glucose, Bld: 145 mg/dL — ABNORMAL HIGH (ref 65–99)
Potassium: 3.9 mmol/L (ref 3.5–5.1)
Sodium: 135 mmol/L (ref 135–145)
TOTAL PROTEIN: 4.1 g/dL — AB (ref 6.5–8.1)

## 2015-09-01 LAB — BASIC METABOLIC PANEL
ANION GAP: 9 (ref 5–15)
ANION GAP: 8 (ref 5–15)
ANION GAP: 7 (ref 5–15)
BUN: 70 mg/dL — ABNORMAL HIGH (ref 6–20)
BUN: 70 mg/dL — ABNORMAL HIGH (ref 6–20)
BUN: 70 mg/dL — ABNORMAL HIGH (ref 6–20)
CALCIUM: 7.1 mg/dL — AB (ref 8.9–10.3)
CALCIUM: 7.2 mg/dL — AB (ref 8.9–10.3)
CALCIUM: 7.3 mg/dL — AB (ref 8.9–10.3)
CO2: 25 mmol/L (ref 22–32)
CO2: 25 mmol/L (ref 22–32)
CO2: 26 mmol/L (ref 22–32)
CREATININE: 3.22 mg/dL — AB (ref 0.61–1.24)
Chloride: 100 mmol/L — ABNORMAL LOW (ref 101–111)
Chloride: 102 mmol/L (ref 101–111)
Chloride: 102 mmol/L (ref 101–111)
Creatinine, Ser: 3.35 mg/dL — ABNORMAL HIGH (ref 0.61–1.24)
Creatinine, Ser: 3.37 mg/dL — ABNORMAL HIGH (ref 0.61–1.24)
GFR, EST AFRICAN AMERICAN: 23 mL/min — AB (ref >60)
GFR, EST AFRICAN AMERICAN: 23 mL/min — AB (ref >60)
GFR, EST AFRICAN AMERICAN: 24 mL/min — AB (ref >60)
GFR, EST NON AFRICAN AMERICAN: 20 mL/min — AB (ref >60)
GFR, EST NON AFRICAN AMERICAN: 20 mL/min — AB (ref >60)
GFR, EST NON AFRICAN AMERICAN: 21 mL/min — AB (ref >60)
Glucose, Bld: 191 mg/dL — ABNORMAL HIGH (ref 65–99)
Glucose, Bld: 181 mg/dL — ABNORMAL HIGH (ref 65–99)
Glucose, Bld: 187 mg/dL — ABNORMAL HIGH (ref 65–99)
Potassium: 3.8 mmol/L (ref 3.5–5.1)
Potassium: 3.5 mmol/L (ref 3.5–5.1)
Potassium: 3.3 mmol/L — ABNORMAL LOW (ref 3.5–5.1)
SODIUM: 134 mmol/L — AB (ref 135–145)
SODIUM: 135 mmol/L (ref 135–145)
Sodium: 135 mmol/L (ref 135–145)

## 2015-09-01 LAB — CK: Total CK: 16165 U/L — ABNORMAL HIGH (ref 49–397)

## 2015-09-01 LAB — MAGNESIUM: Magnesium: 2.3 mg/dL (ref 1.7–2.4)

## 2015-09-01 LAB — CULTURE, RESPIRATORY W GRAM STAIN: Culture: NORMAL

## 2015-09-01 LAB — CBC
HEMATOCRIT: 30.9 % — AB (ref 40.0–52.0)
HEMOGLOBIN: 10.3 g/dL — AB (ref 13.0–18.0)
MCH: 30.2 pg (ref 26.0–34.0)
MCHC: 33.3 g/dL (ref 32.0–36.0)
MCV: 90.7 fL (ref 80.0–100.0)
PLATELETS: 56 10*3/uL — AB (ref 150–440)
RBC: 3.4 MIL/uL — AB (ref 4.40–5.90)
RDW: 14.5 % (ref 11.5–14.5)
WBC: 20.1 10*3/uL — AB (ref 3.8–10.6)

## 2015-09-01 LAB — PHOSPHORUS: Phosphorus: 1.9 mg/dL — ABNORMAL LOW (ref 2.5–4.6)

## 2015-09-01 LAB — LACTATE DEHYDROGENASE: LDH: 692 U/L — ABNORMAL HIGH (ref 98–192)

## 2015-09-01 LAB — GLUCOSE, CAPILLARY
GLUCOSE-CAPILLARY: 128 mg/dL — AB (ref 65–99)
GLUCOSE-CAPILLARY: 177 mg/dL — AB (ref 65–99)
Glucose-Capillary: 155 mg/dL — ABNORMAL HIGH (ref 65–99)
Glucose-Capillary: 162 mg/dL — ABNORMAL HIGH (ref 65–99)
Glucose-Capillary: 83 mg/dL (ref 65–99)

## 2015-09-01 LAB — PROCALCITONIN: PROCALCITONIN: 2.28 ng/mL

## 2015-09-01 MED ORDER — FENTANYL CITRATE (PF) 100 MCG/2ML IJ SOLN
50.0000 ug | Freq: Once | INTRAMUSCULAR | Status: AC
  Administered 2015-09-01: 50 ug via INTRAVENOUS

## 2015-09-01 MED ORDER — DEXMEDETOMIDINE HCL IN NACL 200 MCG/50ML IV SOLN: 0.0000 ug/kg/h | INTRAVENOUS | Status: DC

## 2015-09-01 MED ORDER — POLYETHYLENE GLYCOL 3350 17 G PO PACK
17.0000 g | PACK | Freq: Every day | ORAL | Status: DC
  Administered 2015-09-01 – 2015-09-02 (×2): 17 g via NASOGASTRIC
  Filled 2015-09-01 (×2): qty 1

## 2015-09-01 MED ORDER — MIDAZOLAM HCL 2 MG/2ML IJ SOLN
INTRAMUSCULAR | Status: AC
  Filled 2015-09-01: qty 4

## 2015-09-01 MED ORDER — MIDAZOLAM HCL 2 MG/2ML IJ SOLN
4.0000 mg | Freq: Once | INTRAMUSCULAR | Status: AC
  Administered 2015-09-01: 4 mg via INTRAVENOUS

## 2015-09-01 MED ORDER — FENTANYL BOLUS VIA INFUSION
50.0000 ug | INTRAVENOUS | Status: DC | PRN
  Administered 2015-09-05 (×2): 50 ug via INTRAVENOUS
  Filled 2015-09-01: qty 50

## 2015-09-01 MED ORDER — FUROSEMIDE 10 MG/ML IJ SOLN
80.0000 mg | Freq: Once | INTRAMUSCULAR | Status: AC
Start: 2015-09-01 — End: 2015-09-01
  Administered 2015-09-01: 80 mg via INTRAVENOUS
  Filled 2015-09-01: qty 8

## 2015-09-01 MED ORDER — STERILE WATER FOR INJECTION IJ SOLN
INTRAMUSCULAR | Status: AC
  Administered 2015-09-01: 16:00:00
  Filled 2015-09-01: qty 10

## 2015-09-01 MED ORDER — NOREPINEPHRINE BITARTRATE 1 MG/ML IV SOLN
0.0000 ug/min | INTRAVENOUS | Status: DC
  Administered 2015-09-01: 12 ug/min via INTRAVENOUS
  Administered 2015-09-02: 10 ug/min via INTRAVENOUS
  Filled 2015-09-01 (×2): qty 16

## 2015-09-01 MED ORDER — MIDAZOLAM HCL 2 MG/2ML IJ SOLN
2.0000 mg | Freq: Once | INTRAMUSCULAR | Status: AC
  Administered 2015-09-01: 2 mg via INTRAVENOUS

## 2015-09-01 MED ORDER — ALTEPLASE 2 MG IJ SOLR
2.0000 mg | Freq: Once | INTRAMUSCULAR | Status: AC
  Administered 2015-09-01: 1.8 mg
  Filled 2015-09-01: qty 2

## 2015-09-01 MED ORDER — FENTANYL 2500MCG IN NS 250ML (10MCG/ML) PREMIX INFUSION
25.0000 ug/h | INTRAVENOUS | Status: DC
  Administered 2015-09-01: 50 ug/h via INTRAVENOUS
  Administered 2015-09-02 (×2): 300 ug/h via INTRAVENOUS
  Administered 2015-09-02: 200 ug/h via INTRAVENOUS
  Administered 2015-09-02: 50 ug/h via INTRAVENOUS
  Administered 2015-09-03: 300 ug/h via INTRAVENOUS
  Administered 2015-09-03: 30 ug/h via INTRAVENOUS
  Administered 2015-09-04 (×3): 300 ug/h via INTRAVENOUS
  Administered 2015-09-05 (×2): 400 ug/h via INTRAVENOUS
  Administered 2015-09-05: 300 ug/h via INTRAVENOUS
  Administered 2015-09-06 (×2): 400 ug/h via INTRAVENOUS
  Filled 2015-09-01 (×14): qty 250

## 2015-09-01 NOTE — Progress Notes (Signed)
[facility]  ROUNDING NOTE   Subjective:   Taken off CRRT yesterday. However anuric this last shift. Back on norepinephrine.  Tmax 100.4 Ck trending down  Objective:  Vital signs in last 24 hours:  Temp:  [97.9 F (36.6 C)-100.4 F (38 C)] 100.4 F (38 C) (03/31 1927) Pulse Rate:  [42-117] 88 (04/01 0200) Resp:  [17-43] 23 (04/01 0200) SpO2:  [88 %-100 %] 100 % (04/01 0200) Arterial Line BP: (77-158)/(52-86) 79/55 mmHg (04/01 0200) FiO2 (%):  [30 %-80 %] 40 % (04/01 0834)  Weight change:  Filed Weights   08/29/15 0500 08/30/15 0500 08/31/15 0445  Weight: 98.6 kg (217 lb 6 oz) 109.6 kg (241 lb 10 oz) 113.2 kg (249 lb 9 oz)    Intake/Output: I/O last 3 completed shifts: In: 3344.8 [I.V.:1544.8; NG/GT:1450; IV Piggyback:350] Out: 1779 [Urine:190; Other:870]   Intake/Output this shift:     Physical Exam: General: Critically ill   Head: +ETT +OGT  Eyes: Eyes closed, PERRL  Neck:  trachea midline, left subclavian triple lumen  Lungs:  Clear to auscultation, PRVC FiO2 30%  Heart: tachycardia  Abdomen:  Soft, nontender  Extremities: +cyanosis +mottling   Neurologic: Sedated, intubated.   Skin: No lesions  Access: Right femoral temp HD catheter 3/28 Dr. Stevenson Clinch    Basic Metabolic Panel:  Recent Labs Lab 08/28/15 1721  08/28/15 2133  08/29/15 3903  08/30/15 0092  08/30/15 2241 08/31/15 3300 08/31/15 0915 08/31/15 1735 09/01/15 0435  NA  --   < > 131*  < > 132*  < > 134*  < > 133* 134* 134* 133* 135  K  --   < > 4.6  < > 4.6  < > 3.8  < > 3.6 3.7 3.8 3.6 3.9  CL  --   < > 101  < > 102  < > 105  < > 102 103 104 102 102  CO2  --   < > 15*  < > 20*  < > 21*  < > 24 23 24 27 29   GLUCOSE  --   < > 194*  < > 154*  < > 174*  < > 159* 152* 153* 122* 145*  BUN  --   < > 60*  < > 50*  < > 45*  < > 52* 51* 47* 55* 66*  CREATININE  --   < > 3.00*  < > 2.48*  < > 2.21*  < > 2.29* 2.24* 2.12* 2.50* 3.29*  CALCIUM  --   < > 6.9*  < > 7.0*  < > 6.6*  < > 6.9*  7.4* 7.5* 7.2* 7.1*  MG  --   --  2.0  --  1.8  --  1.9  --   --  2.1  --   --  2.3  PHOS 11.4*  --   --   --  5.6*  --  3.3  --   --  2.3*  --   --  1.9*  < > = values in this interval not displayed.  Liver Function Tests:  Recent Labs Lab 08/30/15 1043 08/30/15 1543 08/31/15 0915 08/31/15 1735 09/01/15 0435  AST 246* 296* 495* 497* 442*  ALT 74* 87* 135* 147* 148*  ALKPHOS 37* 34* 37* 38 48  BILITOT 0.7 0.8 0.9 0.7 0.7  PROT 3.4* 3.7* 4.4* 4.0* 4.1*  ALBUMIN 1.6* 2.0* 2.9* 2.3* 2.2*    Recent Labs Lab 08/31/15 0915  LIPASE 11  AMYLASE 29  No results for input(s): AMMONIA in the last 168 hours.  CBC:  Recent Labs Lab 08/27/15 1221  08/29/15 0439 08/30/15 0433 08/30/15 1543 08/31/15 0447 09/01/15 0435  WBC 11.0*  < > 27.5* 24.3* 23.4* 18.9* 20.1*  NEUTROABS 8.1*  --   --   --  21.7* 16.6*  --   HGB 19.7*  < > 17.0 13.4 11.9* 11.0* 10.3*  HCT 59.7*  < > 52.5* 41.0 36.0* 32.5* 30.9*  MCV 90.1  < > 91.6 91.3 89.3 89.0 90.7  PLT 125*  < > 59* 41* 28* 26* 56*  < > = values in this interval not displayed.  Cardiac Enzymes:  Recent Labs Lab 08/28/15 0007  08/28/15 1721  08/28/15 2133 08/29/15 0439 08/29/15 1642 08/30/15 1043 08/30/15 1543 08/31/15 0915 09/01/15 0435  CKTOTAL  --   < >  --   < >  --  7210* 5580* 10420* 14001* 22702* 16165*  CKMB  --   --   --   --   --   --   --   --  144.6*  --   --   TROPONINI 0.33*  --  0.44*  --  0.41* 0.36*  --   --  0.50*  --   --   < > = values in this interval not displayed.  BNP: Invalid input(s): POCBNP  CBG:  Recent Labs Lab 08/31/15 0733 08/31/15 1136 08/31/15 2332 08/31/15 2357 09/01/15 0716  GLUCAP 130* 128* 43 155* 128*    Microbiology: Results for orders placed or performed during the hospital encounter of 08/27/15  Rapid Influenza A&B Antigens ([facility] only)     Status: None   Collection Time: 08/27/15 12:22 PM  Result Value Ref Range Status   Influenza A ([place]) NEGATIVE NEGATIVE Final    Influenza B ([facility]) NEGATIVE NEGATIVE Final  Urine culture     Status: None   Collection Time: 08/27/15  7:40 PM  Result Value Ref Range Status   Specimen Description URINE, RANDOM  Final   Special Requests Normal  Final   Culture NO GROWTH 1 DAY  Final   Report Status 08/29/2015 FINAL  Final  MRSA PCR Screening     Status: None   Collection Time: 08/28/15  5:25 PM  Result Value Ref Range Status   MRSA by PCR NEGATIVE NEGATIVE Final    Comment:        The GeneXpert MRSA Assay (FDA approved for NASAL specimens only), is one component of a comprehensive MRSA colonization surveillance program. It is not intended to diagnose MRSA infection nor to guide or monitor treatment for MRSA infections.   Culture, blood (Routine X 2) w Reflex to ID Panel     Status: None (Preliminary result)   Collection Time: 08/28/15  5:51 PM  Result Value Ref Range Status   Specimen Description BLOOD RIGHT HAND  Final   Special Requests BAA,ANA,AER,5ML  Final   Culture NO GROWTH 4 DAYS  Final   Report Status PENDING  Incomplete  Culture, blood (Routine X 2) w Reflex to ID Panel     Status: None (Preliminary result)   Collection Time: 08/28/15  5:51 PM  Result Value Ref Range Status   Specimen Description BLOOD RIGHT ASSIST CONTROL  Final   Special Requests BAA,ANA,AER,5ML  Final   Culture NO GROWTH 4 DAYS  Final   Report Status PENDING  Incomplete  Culture, respiratory (NON-Expectorated)     Status: None   Collection Time: 08/29/15  2:16  PM  Result Value Ref Range Status   Specimen Description TRACHEAL ASPIRATE  Final   Special Requests NONE  Final   Gram Stain FEW WBC SEEN FEW YEAST   Final   Culture Consistent with normal respiratory flora.  Final   Report Status 09/01/2015 FINAL  Final    Coagulation Studies:  Recent Labs  08/30/15 1543  LABPROT 15.5*  INR 1.21    Urinalysis: No results for input(s): COLORURINE, LABSPEC, PHURINE, GLUCOSEU, HGBUR, BILIRUBINUR, KETONESUR, PROTEINUR,  UROBILINOGEN, NITRITE, LEUKOCYTESUR in the last 72 hours.  Invalid input(s): APPERANCEUR    Imaging: US Venous Img Lower Bilateral  08/30/2015  CLINICAL DATA:  Bilateral lower extremity swelling EXAM: BILATERAL LOWER EXTREMITY VENOUS DUPLEX ULTRASOUND TECHNIQUE: Doppler venous assessment of the bilateral lower extremity deep venous system was performed, including characterization of spectral flow, compressibility, and phasicity. COMPARISON:  None. FINDINGS: There is a bandage covering the right inguinal region. Evaluation of the right common femoral vein was not performed. There is complete compressibility of the right femoral and popliteal veins. Doppler analysis demonstrates respiratory phasicity and augmentation of flow. No obvious calf vein thrombosis. There is complete compressibility of the left common femoral, femoral, and popliteal veins. There is a normal-appearing venous waveform with augmentation of flow after calf compression. IMPRESSION: Study was limited. A bandage covered the right inguinal region and evaluation of the right common femoral vein could not be performed. Allowing for this limitation, there is no evidence of DVT in the lower extremities. Electronically Signed   By: Marybelle Killings M.D.   On: 08/30/2015 10:13   Dg Chest Port 1 View  09/01/2015  CLINICAL DATA:  Acute onset of respiratory failure. Initial encounter. EXAM: PORTABLE CHEST 1 VIEW COMPARISON:  Chest radiograph performed 08/31/2015 FINDINGS: The patient's endotracheal tube is seen ending 3 cm above the carina. An enteric tube is noted extending below the diaphragm. A left subclavian line is noted ending about the mid SVC. Increasing right upper lobe and left midlung airspace opacities are concerning for multifocal pneumonia. No definite pleural effusion or pneumothorax is seen. The cardiomediastinal silhouette is borderline enlarged. No acute osseous abnormalities are identified. IMPRESSION: 1. Endotracheal tube seen ending  3 cm above the carina. 2. Increasing right upper lobe and left midlung airspace opacities are concerning for multifocal pneumonia. 3. Borderline cardiomegaly. Electronically Signed   By: Garald Balding M.D.   On: 09/01/2015 01:47   Dg Chest Port 1 View  08/31/2015  CLINICAL DATA:  Acute respiratory failure. On ventilator. Acute renal failure. Hypovolemic shock. EXAM: PORTABLE CHEST 1 VIEW COMPARISON:  08/28/2015 FINDINGS: Endotracheal tube, left subclavian central venous catheter and nasogastric tube remain in appropriate position. No pneumothorax visualized. Patient is rotated to the right. Increased bilateral airspace disease is seen with central perihilar predominance suspicious for pulmonary edema, although infection cannot definitely be excluded. Small bilateral pleural effusions cannot be excluded on this portable exam. Heart size remains stable. IMPRESSION: Increased bilateral airspace disease, suspicious for pulmonary edema, although infection cannot be excluded. Electronically Signed   By: Earle Gell M.D.   On: 08/31/2015 12:05   Dg Abd Portable 1v  08/30/2015  CLINICAL DATA:  Abdominal distention.  Dialysis. EXAM: PORTABLE ABDOMEN - 1 VIEW COMPARISON:  CT abdomen and pelvis 08/28/2015. FINDINGS: The bowel gas pattern is now normal. Previously noted distention of the cecum has resolved. A right femoral dialysis catheter is again noted. The axial skeleton is unremarkable. IMPRESSION: 1. Interval resolution of dilated cecum. 2. Normal bowel gas  pattern. 3. Right femoral dialysis catheter. Electronically Signed   By: San Morelle M.D.   On: 08/30/2015 11:23     Medications:   . dexmedetomidine 0.8 mcg/kg/hr (09/01/15 0514)  . feeding supplement (VITAL 1.5 CAL) 1,000 mL (08/31/15 1755)  . fentaNYL infusion INTRAVENOUS 25 mcg/hr (09/01/15 0624)  . norepinephrine 22 mcg/min (09/01/15 0624)  .  sodium bicarbonate  infusion 1000 mL 50 mL/hr at 08/31/15 1924   . antiseptic oral rinse  7 mL  Mouth Rinse QID  . ceFEPime (MAXIPIME) IV  2 g Intravenous Q12H  . chlorhexidine gluconate (SAGE KIT)  15 mL Mouth Rinse BID  . feeding supplement (PRO-STAT SUGAR FREE 64)  30 mL Oral QID  . free water  30 mL Per Tube 6 times per day  . hydrocortisone sod succinate (SOLU-CORTEF) inj  50 mg Intravenous Q12H  . oseltamivir  30 mg Per Tube Daily  . pantoprazole sodium  40 mg Per Tube Q1200  . polyethylene glycol  17 g Per NG tube Daily   acetaminophen **OR** acetaminophen, albuterol, fentaNYL, heparin, LORazepam, sodium chloride, sodium chloride flush  Assessment/ Plan:  John Hoover is a 51 y.o. white male with no specific past medical history, who was admitted to [facility] on 08/27/2015  1. Acute Renal Failure 2. Metabolic Acidosis 3. Rhabdomyolysis 4. Acute respiratory failure 5. Influenza A positive 6. Hypoalbuminemia  Plan Patient with anuric urine output.  Restart CRRT CVVHD 2K bath, BFR 300 DFR 2.5 litre. UF 50 q 4 hour BMP.  Replace phosphorus   LOS: [place], John Hoover 4/1/20178:59 AM

## 2015-09-01 NOTE — Progress Notes (Signed)
ABG drawn by RT and reviewed by NP Marylou FlesherMagadalene Tukov.  Patient placed back in Pressure support mode 10 Peep 5 40% fio2 per NP request.  See vent flow sheet documentation

## 2015-09-01 NOTE — Progress Notes (Signed)
o2 increased to 80% due to desaturation. Saturation now at 93%

## 2015-09-01 NOTE — Progress Notes (Signed)
[facility] Hematology/Oncology Progress Note  Date of admission: 08/27/2015  Hospital day:  09/01/2015  Chief Complaint: John Hoover is a 50 y.o. male who was admitted with influenza A with associated rhabdomyolysis and acute renal insufficiency.  Subjective: Patient intubated and sedated.  Remains in ICU.  Back on CRRT and Levophed drip.  Social History: The patient is accompanied by his wife today.  Allergies: No Known Allergies  Scheduled Medications: . antiseptic oral rinse  7 mL Mouth Rinse QID  . ceFEPime (MAXIPIME) IV  2 g Intravenous Q12H  . chlorhexidine gluconate (SAGE KIT)  15 mL Mouth Rinse BID  . feeding supplement (PRO-STAT SUGAR FREE 64)  30 mL Oral QID  . free water  30 mL Per Tube 6 times per day  . furosemide  80 mg Intravenous Once  . hydrocortisone sod succinate (SOLU-CORTEF) inj  50 mg Intravenous Q12H  . pantoprazole sodium  40 mg Per Tube Q1200  . polyethylene glycol  17 g Per NG tube Daily    Review of Systems: Unable to obtain as patient sedated. Prior to admission he had fever and chills. He was short of breath with cough. He had severe pain "all over".  Physical Exam: Blood pressure 89/71, pulse 64, temperature 97.7 F (36.5 C), temperature source Axillary, resp. rate 21, height 6' 1"  (1.854 m), weight 249 lb 9 oz (113.2 kg), SpO2 100 %.  GENERAL: Well developed, well nourished, gentleman sedated and intubated in the ICU. HEAD: Short brown hair.  Normocephalic, atraumatic, face symmetric, no Cushingoid features. EYES: Green/hazel eyes.  Pupils equal round and reactive. No conjunctivitis or scleral icterus. ENT: Intubated. Oropharynx clear without lesion. Tongue normal.  RESPIRATORY: No rales, wheezes or rhonchi. CARDIOVASCULAR: Regular rate and rhythm without murmur, rub or gallop. ABDOMEN: Soft, with slightly decreased bowel sounds, and no hepatosplenomegaly. No masses. GENITOURINARY:  Right femoral catheter.   Foley catheter in place.  Urine color lighter. SKIN: No rashes, ulcers or lesions. EXTREMITIES: Diffuse edema. Feet and hands warmer (improved) with mottling. No palpable cords. LYMPH NODES: No palpable cervical, supraclavicular, axillary or inguinal adenopathy  NEUROLOGICAL: Sedated   Results for orders placed or performed during the hospital encounter of 08/27/15 (from the past 48 hour(s))  Occult blood card to lab, stool RN will collect     Status: Abnormal   Collection Time: 08/30/15  1:52 PM  Result Value Ref Range   Fecal Occult Bld POSITIVE (A) NEGATIVE  Blood gas, arterial     Status: Abnormal   Collection Time: 08/30/15  3:12 PM  Result Value Ref Range   FIO2 0.30    Delivery systems VENTILATOR    Mode PRESSURE REGULATED VOLUME CONTROL    VT 600 mL   LHR 24 resp/min   Peep/cpap 5.0 cm H20   pH, Arterial 7.45 7.350 - 7.450   pCO2 arterial 31 (L) 32.0 - 48.0 mmHg   pO2, Arterial 63 (L) 83.0 - 108.0 mmHg   Bicarbonate 21.5 21.0 - 28.0 mEq/L   Acid-base deficit 1.7 0.0 - 2.0 mmol/L   O2 Saturation 92.8 %   Patient temperature 37.0    Collection site RIGHT FEMORAL    Sample type ARTERIAL DRAW   Vancomycin, trough     Status: Abnormal   Collection Time: 08/30/15  3:43 PM  Result Value Ref Range   Vancomycin Tr 8 (L) 10 - 20 ug/mL  Lactate dehydrogenase     Status: Abnormal   Collection Time: 08/30/15  3:43 PM  Result Value Ref Range   LDH 668 (H) 98 - 192 U/L  Fibrin Degradation Products ([facility] only)     Status: Abnormal   Collection Time: 08/30/15  3:43 PM  Result Value Ref Range   Fibrin Degradation Prod. >10 AND <40 (A) <10  Protime-INR     Status: Abnormal   Collection Time: 08/30/15  3:43 PM  Result Value Ref Range   Prothrombin Time 15.5 (H) 11.4 - 15.0 seconds   INR 1.21   Lactic acid, plasma     Status: Abnormal   Collection Time: 08/30/15  3:43 PM  Result Value Ref Range   Lactic Acid, Venous 5.3 (HH) 0.5 - 2.0 mmol/L    Comment: CRITICAL RESULT CALLED  TO, READ BACK BY AND VERIFIED WITH STACEY COLLIE AT 1643 08/30/2015 BY TFK   Fibrinogen     Status: None   Collection Time: 08/30/15  3:43 PM  Result Value Ref Range   Fibrinogen 286 210 - 470 mg/dL  CBC with Differential/Platelet     Status: Abnormal   Collection Time: 08/30/15  3:43 PM  Result Value Ref Range   WBC 23.4 (H) 3.8 - 10.6 K/uL   RBC 4.03 (L) 4.40 - 5.90 MIL/uL   Hemoglobin 11.9 (L) 13.0 - 18.0 g/dL   HCT 36.0 (L) 40.0 - 52.0 %   MCV 89.3 80.0 - 100.0 fL   MCH 29.5 26.0 - 34.0 pg   MCHC 33.1 32.0 - 36.0 g/dL   RDW 14.7 (H) 11.5 - 14.5 %   Platelets 28 (LL) 150 - 440 K/uL    Comment: PLATELET COUNT CONFIRMED BY SMEAR CRITICAL RESULT CALLED TO, READ BACK BY AND VERIFIED WITH: STACY HOLLY AT 1703 08/30/15 MLZ     Neutrophils Relative % 78 %   Lymphocytes Relative 5 %   Monocytes Relative 2 %   Eosinophils Relative 0 %   Basophils Relative 0 %   Band Neutrophils 15 %   Metamyelocytes Relative 0 %   Myelocytes 0 %   Promyelocytes Absolute 0 %   Blasts 0 %   nRBC 0 0 /100 WBC   Other 0 %   Neutro Abs 21.7 (H) 1.4 - 6.5 K/uL   Lymphs Abs 1.2 1.0 - 3.6 K/uL   Monocytes Absolute 0.5 0.2 - 1.0 K/uL   Eosinophils Absolute 0.0 0 - 0.7 K/uL   Basophils Absolute 0.0 0 - 0.1 K/uL   RBC Morphology MIXED RBC POPULATION   Troponin I     Status: Abnormal   Collection Time: 08/30/15  3:43 PM  Result Value Ref Range   Troponin I 0.50 (H) <0.031 ng/mL    Comment: READ BACK AND VERIFIED WITH STACEY COLLIE AT 1644 08/30/2015 BY TFK        POSSIBLE MYOCARDIAL ISCHEMIA. SERIAL TESTING RECOMMENDED.   CK     Status: Abnormal   Collection Time: 08/30/15  3:43 PM  Result Value Ref Range   Total CK 14001 (H) 49 - 397 U/L    Comment: RESULTS CONFIRMED BY MANUAL DILUTION  CKMB([facility] only)     Status: Abnormal   Collection Time: 08/30/15  3:43 PM  Result Value Ref Range   CK, MB 144.6 (H) 0.5 - 5.0 ng/mL  Comprehensive metabolic panel     Status: Abnormal   Collection Time:  08/30/15  3:43 PM  Result Value Ref Range   Sodium 132 (L) 135 - 145 mmol/L   Potassium 4.0 3.5 - 5.1 mmol/L   Chloride 101  101 - 111 mmol/L   CO2 23 22 - 32 mmol/L   Glucose, Bld 139 (H) 65 - 99 mg/dL   BUN 51 (H) 6 - 20 mg/dL   Creatinine, Ser 2.52 (H) 0.61 - 1.24 mg/dL   Calcium 6.8 (L) 8.9 - 10.3 mg/dL   Total Protein 3.7 (L) 6.5 - 8.1 g/dL   Albumin 2.0 (L) 3.5 - 5.0 g/dL   AST 296 (H) 15 - 41 U/L   ALT 87 (H) 17 - 63 U/L   Alkaline Phosphatase 34 (L) 38 - 126 U/L   Total Bilirubin 0.8 0.3 - 1.2 mg/dL   GFR calc non Af Amer 28 (L) >60 mL/min   GFR calc Af Amer 32 (L) >60 mL/min    Comment: (NOTE) The eGFR has been calculated using the CKD EPI equation. This calculation has not been validated in all clinical situations. eGFR's persistently <60 mL/min signify possible Chronic Kidney Disease.    Anion gap 8 5 - 15  Glucose, capillary     Status: Abnormal   Collection Time: 08/30/15  4:05 PM  Result Value Ref Range   Glucose-Capillary 129 (H) 65 - 99 mg/dL  Glucose, capillary     Status: Abnormal   Collection Time: 08/30/15  8:06 PM  Result Value Ref Range   Glucose-Capillary 127 (H) 65 - 99 mg/dL  Basic metabolic panel     Status: Abnormal   Collection Time: 08/30/15 10:41 PM  Result Value Ref Range   Sodium 133 (L) 135 - 145 mmol/L   Potassium 3.6 3.5 - 5.1 mmol/L   Chloride 102 101 - 111 mmol/L   CO2 24 22 - 32 mmol/L   Glucose, Bld 159 (H) 65 - 99 mg/dL   BUN 52 (H) 6 - 20 mg/dL   Creatinine, Ser 2.29 (H) 0.61 - 1.24 mg/dL   Calcium 6.9 (L) 8.9 - 10.3 mg/dL   GFR calc non Af Amer 31 (L) >60 mL/min   GFR calc Af Amer 36 (L) >60 mL/min    Comment: (NOTE) The eGFR has been calculated using the CKD EPI equation. This calculation has not been validated in all clinical situations. eGFR's persistently <60 mL/min signify possible Chronic Kidney Disease.    Anion gap 7 5 - 15  Glucose, capillary     Status: Abnormal   Collection Time: 08/31/15 12:05 AM  Result Value  Ref Range   Glucose-Capillary 136 (H) 65 - 99 mg/dL  Basic metabolic panel     Status: Abnormal   Collection Time: 08/31/15  4:47 AM  Result Value Ref Range   Sodium 134 (L) 135 - 145 mmol/L   Potassium 3.7 3.5 - 5.1 mmol/L   Chloride 103 101 - 111 mmol/L   CO2 23 22 - 32 mmol/L   Glucose, Bld 152 (H) 65 - 99 mg/dL   BUN 51 (H) 6 - 20 mg/dL   Creatinine, Ser 2.24 (H) 0.61 - 1.24 mg/dL   Calcium 7.4 (L) 8.9 - 10.3 mg/dL   GFR calc non Af Amer 32 (L) >60 mL/min   GFR calc Af Amer 37 (L) >60 mL/min    Comment: (NOTE) The eGFR has been calculated using the CKD EPI equation. This calculation has not been validated in all clinical situations. eGFR's persistently <60 mL/min signify possible Chronic Kidney Disease.    Anion gap 8 5 - 15  Magnesium     Status: None   Collection Time: 08/31/15  4:47 AM  Result Value Ref Range  Magnesium 2.1 1.7 - 2.4 mg/dL  Phosphorus     Status: Abnormal   Collection Time: 08/31/15  4:47 AM  Result Value Ref Range   Phosphorus 2.3 (L) 2.5 - 4.6 mg/dL  Reticulocytes     Status: Abnormal   Collection Time: 08/31/15  4:47 AM  Result Value Ref Range   Retic Ct Pct 0.6 0.4 - 3.1 %   RBC. 3.65 (L) 4.40 - 5.90 MIL/uL   Retic Count, Manual 21.9 19.0 - 183.0 K/uL  APTT     Status: None   Collection Time: 08/31/15  4:47 AM  Result Value Ref Range   aPTT 34 24 - 36 seconds  CBC with Differential/Platelet     Status: Abnormal   Collection Time: 08/31/15  4:47 AM  Result Value Ref Range   WBC 18.9 (H) 3.8 - 10.6 K/uL   RBC 3.65 (L) 4.40 - 5.90 MIL/uL   Hemoglobin 11.0 (L) 13.0 - 18.0 g/dL   HCT 32.5 (L) 40.0 - 52.0 %   MCV 89.0 80.0 - 100.0 fL   MCH 30.0 26.0 - 34.0 pg   MCHC 33.7 32.0 - 36.0 g/dL   RDW 15.0 (H) 11.5 - 14.5 %   Platelets 26 (LL) 150 - 440 K/uL    Comment: CRITICAL VALUE NOTED.  VALUE IS CONSISTENT WITH PREVIOUSLY REPORTED AND CALLED VALUE.   Neutrophils Relative % 88% %   Neutro Abs 16.6 (H) 1.4 - 6.5 K/uL   Lymphocytes Relative 4%  %   Lymphs Abs 0.8 (L) 1.0 - 3.6 K/uL   Monocytes Relative 8% %   Monocytes Absolute 1.4 (H) 0.2 - 1.0 K/uL   Eosinophils Relative 0% %   Eosinophils Absolute 0.0 0 - 0.7 K/uL   Basophils Relative 0% %   Basophils Absolute 0.0 0 - 0.1 K/uL  Glucose, capillary     Status: Abnormal   Collection Time: 08/31/15  7:33 AM  Result Value Ref Range   Glucose-Capillary 130 (H) 65 - 99 mg/dL  Basic metabolic panel     Status: Abnormal   Collection Time: 08/31/15  9:15 AM  Result Value Ref Range   Sodium 134 (L) 135 - 145 mmol/L   Potassium 3.8 3.5 - 5.1 mmol/L   Chloride 104 101 - 111 mmol/L   CO2 24 22 - 32 mmol/L   Glucose, Bld 153 (H) 65 - 99 mg/dL   BUN 47 (H) 6 - 20 mg/dL   Creatinine, Ser 2.12 (H) 0.61 - 1.24 mg/dL   Calcium 7.5 (L) 8.9 - 10.3 mg/dL   GFR calc non Af Amer 34 (L) >60 mL/min   GFR calc Af Amer 40 (L) >60 mL/min    Comment: (NOTE) The eGFR has been calculated using the CKD EPI equation. This calculation has not been validated in all clinical situations. eGFR's persistently <60 mL/min signify possible Chronic Kidney Disease.    Anion gap 6 5 - 15  CK     Status: Abnormal   Collection Time: 08/31/15  9:15 AM  Result Value Ref Range   Total CK 22702 (H) 49 - 397 U/L    Comment: RESULT CONFIRMED BY MANUAL DILUTION  Hepatic function panel     Status: Abnormal   Collection Time: 08/31/15  9:15 AM  Result Value Ref Range   Total Protein 4.4 (L) 6.5 - 8.1 g/dL   Albumin 2.9 (L) 3.5 - 5.0 g/dL   AST 495 (H) 15 - 41 U/L   ALT 135 (H) 17 -  63 U/L   Alkaline Phosphatase 37 (L) 38 - 126 U/L   Total Bilirubin 0.9 0.3 - 1.2 mg/dL   Bilirubin, Direct 0.3 0.1 - 0.5 mg/dL   Indirect Bilirubin 0.6 0.3 - 0.9 mg/dL  Amylase     Status: None   Collection Time: 08/31/15  9:15 AM  Result Value Ref Range   Amylase 29 28 - 100 U/L  Lipase, blood     Status: None   Collection Time: 08/31/15  9:15 AM  Result Value Ref Range   Lipase 11 11 - 51 U/L  Blood gas, arterial     Status:  Abnormal   Collection Time: 08/31/15  9:30 AM  Result Value Ref Range   FIO2 30.00    Mode PRESSURE REGULATED VOLUME CONTROL    VT 600 mL   LHR 20 resp/min   Peep/cpap 5.0 cm H20   pH, Arterial 7.51 (H) 7.350 - 7.450   pCO2 arterial 32 32.0 - 48.0 mmHg   pO2, Arterial 93 83.0 - 108.0 mmHg   Bicarbonate 25.5 21.0 - 28.0 mEq/L   Acid-Base Excess 2.8 0.0 - 3.0 mmol/L   O2 Saturation 97.9 %   Patient temperature 37.0    Collection site A-LINE DRAW    Sample type ARTERIAL DRAW    Mechanical Rate 20   Glucose, capillary     Status: Abnormal   Collection Time: 08/31/15 11:36 AM  Result Value Ref Range   Glucose-Capillary 128 (H) 65 - 99 mg/dL  Culture, respiratory (NON-Expectorated)     Status: None (Preliminary result)   Collection Time: 08/31/15  3:45 PM  Result Value Ref Range   Specimen Description TRACHEAL ASPIRATE    Special Requests NONE    Gram Stain      FEW WBC SEEN MANY GRAM POSITIVE RODS MODERATE GRAM POSITIVE COCCI RARE YEAST    Culture HOLDING FOR POSSIBLE PATHOGEN    Report Status PENDING   Comprehensive metabolic panel     Status: Abnormal   Collection Time: 08/31/15  5:35 PM  Result Value Ref Range   Sodium 133 (L) 135 - 145 mmol/L   Potassium 3.6 3.5 - 5.1 mmol/L   Chloride 102 101 - 111 mmol/L   CO2 27 22 - 32 mmol/L   Glucose, Bld 122 (H) 65 - 99 mg/dL   BUN 55 (H) 6 - 20 mg/dL   Creatinine, Ser 2.50 (H) 0.61 - 1.24 mg/dL   Calcium 7.2 (L) 8.9 - 10.3 mg/dL   Total Protein 4.0 (L) 6.5 - 8.1 g/dL   Albumin 2.3 (L) 3.5 - 5.0 g/dL   AST 497 (H) 15 - 41 U/L   ALT 147 (H) 17 - 63 U/L   Alkaline Phosphatase 38 38 - 126 U/L   Total Bilirubin 0.7 0.3 - 1.2 mg/dL   GFR calc non Af Amer 28 (L) >60 mL/min   GFR calc Af Amer 33 (L) >60 mL/min    Comment: (NOTE) The eGFR has been calculated using the CKD EPI equation. This calculation has not been validated in all clinical situations. eGFR's persistently <60 mL/min signify possible Chronic Kidney Disease.     Anion gap 4 (L) 5 - 15  Glucose, capillary     Status: None   Collection Time: 08/31/15 11:32 PM  Result Value Ref Range   Glucose-Capillary 65 65 - 99 mg/dL  Glucose, capillary     Status: Abnormal   Collection Time: 08/31/15 11:57 PM  Result Value Ref Range   Glucose-Capillary  155 (H) 65 - 99 mg/dL  Blood gas, arterial     Status: Abnormal   Collection Time: 09/01/15  1:39 AM  Result Value Ref Range   FIO2 0.80    Mode VENTILATOR    VT 600 mL   Peep/cpap 5.0 cm H20   pH, Arterial 7.58 (H) 7.350 - 7.450   pCO2 arterial 29 (L) 32.0 - 48.0 mmHg   pO2, Arterial 78 (L) 83.0 - 108.0 mmHg   Bicarbonate 27.2 21.0 - 28.0 mEq/L   Acid-Base Excess 5.5 (H) 0.0 - 3.0 mmol/L   O2 Saturation 97.2 %   Patient temperature 37.0    Collection site LEFT RADIAL    Sample type ARTERIAL DRAW    Allens test (pass/fail) PASS PASS   Mechanical Rate 14   Magnesium     Status: None   Collection Time: 09/01/15  4:35 AM  Result Value Ref Range   Magnesium 2.3 1.7 - 2.4 mg/dL  Phosphorus     Status: Abnormal   Collection Time: 09/01/15  4:35 AM  Result Value Ref Range   Phosphorus 1.9 (L) 2.5 - 4.6 mg/dL  CBC     Status: Abnormal   Collection Time: 09/01/15  4:35 AM  Result Value Ref Range   WBC 20.1 (H) 3.8 - 10.6 K/uL   RBC 3.40 (L) 4.40 - 5.90 MIL/uL   Hemoglobin 10.3 (L) 13.0 - 18.0 g/dL   HCT 30.9 (L) 40.0 - 52.0 %   MCV 90.7 80.0 - 100.0 fL   MCH 30.2 26.0 - 34.0 pg   MCHC 33.3 32.0 - 36.0 g/dL   RDW 14.5 11.5 - 14.5 %   Platelets 56 (L) 150 - 440 K/uL  Comprehensive metabolic panel     Status: Abnormal   Collection Time: 09/01/15  4:35 AM  Result Value Ref Range   Sodium 135 135 - 145 mmol/L   Potassium 3.9 3.5 - 5.1 mmol/L   Chloride 102 101 - 111 mmol/L   CO2 29 22 - 32 mmol/L   Glucose, Bld 145 (H) 65 - 99 mg/dL   BUN 66 (H) 6 - 20 mg/dL   Creatinine, Ser 3.29 (H) 0.61 - 1.24 mg/dL   Calcium 7.1 (L) 8.9 - 10.3 mg/dL   Total Protein 4.1 (L) 6.5 - 8.1 g/dL   Albumin 2.2 (L) 3.5  - 5.0 g/dL   AST 442 (H) 15 - 41 U/L   ALT 148 (H) 17 - 63 U/L   Alkaline Phosphatase 48 38 - 126 U/L   Total Bilirubin 0.7 0.3 - 1.2 mg/dL   GFR calc non Af Amer 20 (L) >60 mL/min   GFR calc Af Amer 23 (L) >60 mL/min    Comment: (NOTE) The eGFR has been calculated using the CKD EPI equation. This calculation has not been validated in all clinical situations. eGFR's persistently <60 mL/min signify possible Chronic Kidney Disease.    Anion gap 4 (L) 5 - 15  CK     Status: Abnormal   Collection Time: 09/01/15  4:35 AM  Result Value Ref Range   [place] [address] (H) 49 - 397 U/L  Lactate dehydrogenase     Status: Abnormal   Collection Time: 09/01/15  4:35 AM  Result Value Ref Range   LDH 692 (H) 98 - 192 U/L  Glucose, capillary     Status: Abnormal   Collection Time: 09/01/15  7:16 AM  Result Value Ref Range   Glucose-Capillary 128 (H) 65 - 99 mg/dL  Blood gas, arterial     Status: Abnormal   Collection Time: 09/01/15  9:50 AM  Result Value Ref Range   FIO2 0.40    Peep/cpap 5.0 cm H20   Pressure support 10 cm H20   pH, Arterial 7.54 (H) 7.350 - 7.450   pCO2 arterial 33 32.0 - 48.0 mmHg   pO2, Arterial 75 (L) 83.0 - 108.0 mmHg   Bicarbonate 28.2 (H) 21.0 - 28.0 mEq/L   Acid-Base Excess 5.6 (H) 0.0 - 3.0 mmol/L   O2 Saturation 96.5 %   Patient temperature 37.0    Collection site A-LINE    Sample type ARTERIAL DRAW    Allens test (pass/fail) POSITIVE (A) PASS  Procalcitonin - Baseline     Status: None   Collection Time: 09/01/15 10:24 AM  Result Value Ref Range   Procalcitonin 2.28 ng/mL    Comment:        Interpretation: PCT > 2 ng/mL: Systemic infection (sepsis) is likely, unless other causes are known. (NOTE)         ICU PCT Algorithm               Non ICU PCT Algorithm    ----------------------------     ------------------------------         PCT < 0.25 ng/mL                 PCT < 0.1 ng/mL     Stopping of antibiotics            Stopping of antibiotics        strongly encouraged.               strongly encouraged.    ----------------------------     ------------------------------       PCT level decrease by               PCT < 0.25 ng/mL       >= 80% from peak PCT       OR PCT 0.25 - 0.5 ng/mL          Stopping of antibiotics                                             encouraged.     Stopping of antibiotics           encouraged.    ----------------------------     ------------------------------       PCT level decrease by              PCT >= 0.25 ng/mL       < 80% from peak PCT        AND PCT >= 0.5 ng/mL            Continuing antibiotics                                               encouraged.       Continuing antibiotics            encouraged.    ----------------------------     ------------------------------     PCT level increase compared          PCT > 0.5 ng/mL  with peak PCT AND          PCT >= 0.5 ng/mL             Escalation of antibiotics                                          strongly encouraged.      Escalation of antibiotics        strongly encouraged.   Glucose, capillary     Status: Abnormal   Collection Time: 09/01/15 11:07 AM  Result Value Ref Range   Glucose-Capillary 177 (H) 65 - 99 mg/dL   Dg Chest Port 1 View  09/01/2015  CLINICAL DATA:  Acute onset of respiratory failure. Initial encounter. EXAM: PORTABLE CHEST 1 VIEW COMPARISON:  Chest radiograph performed 08/31/2015 FINDINGS: The patient's endotracheal tube is seen ending 3 cm above the carina. An enteric tube is noted extending below the diaphragm. A left subclavian line is noted ending about the mid SVC. Increasing right upper lobe and left midlung airspace opacities are concerning for multifocal pneumonia. No definite pleural effusion or pneumothorax is seen. The cardiomediastinal silhouette is borderline enlarged. No acute osseous abnormalities are identified. IMPRESSION: 1. Endotracheal tube seen ending 3 cm above the carina. 2. Increasing right upper  lobe and left midlung airspace opacities are concerning for multifocal pneumonia. 3. Borderline cardiomegaly. Electronically Signed   By: Garald Balding M.D.   On: 09/01/2015 01:47   Dg Chest Port 1 View  08/31/2015  CLINICAL DATA:  Acute respiratory failure. On ventilator. Acute renal failure. Hypovolemic shock. EXAM: PORTABLE CHEST 1 VIEW COMPARISON:  08/28/2015 FINDINGS: Endotracheal tube, left subclavian central venous catheter and nasogastric tube remain in appropriate position. No pneumothorax visualized. Patient is rotated to the right. Increased bilateral airspace disease is seen with central perihilar predominance suspicious for pulmonary edema, although infection cannot definitely be excluded. Small bilateral pleural effusions cannot be excluded on this portable exam. Heart size remains stable. IMPRESSION: Increased bilateral airspace disease, suspicious for pulmonary edema, although infection cannot be excluded. Electronically Signed   By: Earle Gell M.D.   On: 08/31/2015 12:05    Assessment:  John Hoover is a 49 y.o. male with influenza A complicated by viral myositis with rhabdomyolysis. He developed respiratory distress and has been intubated. Associated with rhabdomyolysis, he developed acute renal failure and is on CRRT. He initially had an elevated hematocrit likely due to dehydration (no prior history of myeloproliferative disorder).   CPK has improved today: 2,831-7,388 on 08/28/2015, 7,210-5,580 on 08/29/2015, 10,420-14,001 on 08/30/2015, 22,702 on 08/31/2015, and 16,165 today.  LDH appears to be plateauing (668 yesterday and 692 today).  He has lactic acidosis and is on a bicarb drip. Pressors have been restarted.  CXR reveals increasing RUL and left midlung airspace opacities.  He has had a progressive decline in his platelet count likely due to sepsis. Platelet count has improved from 26,000 to 56,000 today.  Doubt heparin induced thrombocytopenia or TTP. PT, PTT,  and fibrinogen are normal. He had some melena on 08/30/2015. Hemoglobin is 10.3 (baseline 12.8 on 11/06/2010).  Retic count is low (0.6%) likely due to suppression.  Peripheral smear reveals a left shift and no schistocytes.  Platelet count is decreased.  Plan: 1.  Continue supportive care. 2.  CBC with diff daily. 3.  Follow-up pending labs (HIT assay, serotonin release assay, haptoglobin, plasma free  hemoglobin, and ANA) and urine myoglobin. 4.  Call if any questions.  Thank you for allowing me to participate in John Hoover 's care. I will follow him closely with you while hospitalized and after discharge in the outpatient department.   Lequita Asal, MD  09/01/2015, 12:07 PM

## 2015-09-01 NOTE — Progress Notes (Signed)
MEDICATION RELATED CONSULT NOTE - FOLLOW UP   Pharmacy Consult for CRRT Dosing/Cefepime dosing    No Known Allergies  Patient Measurements: Height: 6' 1"  (185.4 cm) Weight: 249 lb 9 oz (113.2 kg) IBW/kg (Calculated) : 79.9  Vital Signs: Pulse Rate: 74 (04/01 0900) Intake/Output from previous day: 03/31 0701 - 04/01 0700 In: 1757.2 [I.V.:807.2; NG/GT:850; IV Piggyback:100] Out: 358 [Urine:70] Intake/Output from this shift:    Labs:  Recent Labs  08/30/15 0433  08/30/15 1543  08/31/15 0447 08/31/15 0915 08/31/15 1735 09/01/15 0435  WBC 24.3*  --  23.4*  --  18.9*  --   --  20.1*  HGB 13.4  --  11.9*  --  11.0*  --   --  10.3*  HCT 41.0  --  36.0*  --  32.5*  --   --  30.9*  PLT 41*  --  28*  --  26*  --   --  56*  APTT  --   --   --   --  34  --   --   --   CREATININE 2.21*  < > 2.52*  < > 2.24* 2.12* 2.50* 3.29*  MG 1.9  --   --   --  2.1  --   --  2.3  PHOS 3.3  --   --   --  2.3*  --   --  1.9*  ALBUMIN  --   < > 2.0*  --   --  2.9* 2.3* 2.2*  PROT  --   < > 3.7*  --   --  4.4* 4.0* 4.1*  AST  --   < > 296*  --   --  495* 497* 442*  ALT  --   < > 87*  --   --  135* 147* 148*  ALKPHOS  --   < > 34*  --   --  37* 38 48  BILITOT  --   < > 0.8  --   --  0.9 0.7 0.7  BILIDIR  --   --   --   --   --  0.3  --   --   IBILI  --   --   --   --   --  0.6  --   --   < > = values in this interval not displayed. Estimated Creatinine Clearance: 35 mL/min (by C-G formula based on Cr of 3.29).   Microbiology: Recent Results (from the past 720 hour(s))  Rapid Influenza A&B Antigens ([place] only)     Status: None   Collection Time: 08/27/15 12:22 PM  Result Value Ref Range Status   Influenza A ([place]) NEGATIVE NEGATIVE Final   Influenza B ([facility]) NEGATIVE NEGATIVE Final  Urine culture     Status: None   Collection Time: 08/27/15  7:40 PM  Result Value Ref Range Status   Specimen Description URINE, RANDOM  Final   Special Requests Normal  Final   Culture NO GROWTH 1 DAY  Final    Report Status 08/29/2015 FINAL  Final  MRSA PCR Screening     Status: None   Collection Time: 08/28/15  5:25 PM  Result Value Ref Range Status   MRSA by PCR NEGATIVE NEGATIVE Final    Comment:        The GeneXpert MRSA Assay (FDA approved for NASAL specimens only), is one component of a comprehensive MRSA colonization surveillance program. It is not intended to diagnose MRSA infection  nor to guide or monitor treatment for MRSA infections.   Culture, blood (Routine X 2) w Reflex to ID Panel     Status: None (Preliminary result)   Collection Time: 08/28/15  5:51 PM  Result Value Ref Range Status   Specimen Description BLOOD RIGHT HAND  Final   Special Requests BAA,ANA,AER,5ML  Final   Culture NO GROWTH 4 DAYS  Final   Report Status PENDING  Incomplete  Culture, blood (Routine X 2) w Reflex to ID Panel     Status: None (Preliminary result)   Collection Time: 08/28/15  5:51 PM  Result Value Ref Range Status   Specimen Description BLOOD RIGHT ASSIST CONTROL  Final   Special Requests BAA,ANA,AER,5ML  Final   Culture NO GROWTH 4 DAYS  Final   Report Status PENDING  Incomplete  Culture, respiratory (NON-Expectorated)     Status: None   Collection Time: 08/29/15  2:16 PM  Result Value Ref Range Status   Specimen Description TRACHEAL ASPIRATE  Final   Special Requests NONE  Final   Gram Stain FEW WBC SEEN FEW YEAST   Final   Culture Consistent with normal respiratory flora.  Final   Report Status 09/01/2015 FINAL  Final  Culture, respiratory (NON-Expectorated)     Status: None (Preliminary result)   Collection Time: 08/31/15  3:45 PM  Result Value Ref Range Status   Specimen Description TRACHEAL ASPIRATE  Final   Special Requests NONE  Final   Gram Stain PENDING  Incomplete   Culture HOLDING FOR POSSIBLE PATHOGEN  Final   Report Status PENDING  Incomplete    Medical History: Past Medical History  Diagnosis Date  . Meniere's disease     Medications:  Scheduled:  .  antiseptic oral rinse  7 mL Mouth Rinse QID  . ceFEPime (MAXIPIME) IV  2 g Intravenous Q12H  . chlorhexidine gluconate (SAGE KIT)  15 mL Mouth Rinse BID  . feeding supplement (PRO-STAT SUGAR FREE 64)  30 mL Oral QID  . free water  30 mL Per Tube 6 times per day  . hydrocortisone sod succinate (SOLU-CORTEF) inj  50 mg Intravenous Q12H  . oseltamivir  30 mg Per Tube Daily  . pantoprazole sodium  40 mg Per Tube Q1200  . polyethylene glycol  17 g Per NG tube Daily   Infusions:  . dexmedetomidine 0.8 mcg/kg/hr (09/01/15 0514)  . feeding supplement (VITAL 1.5 CAL) 1,000 mL (08/31/15 1755)  . fentaNYL infusion INTRAVENOUS 25 mcg/hr (09/01/15 0624)  . norepinephrine (LEVOPHED) Adult infusion 12 mcg/min (09/01/15 0959)  . norepinephrine 12 mcg/min (09/01/15 0952)  .  sodium bicarbonate  infusion 1000 mL 50 mL/hr at 08/31/15 1924    Assessment: Pharmacy consulted to assist in adjusting medications for CRRT in this 50 y/o M with rhabdomyolysis. Patient is currently on day 5 of cefepime 2g IV Q12hr.    Cefepime 3/28 >> Vancomycin 3/29 >> 3/29 Oseltamivir 3/28 >> 4/1  Plan:  No further adjustments are necessary at this time.  Pharmacy will continue to monitor and adjust per consult.   Trypp Heckmann D 09/01/2015,10:13 AM

## 2015-09-01 NOTE — Progress Notes (Signed)
Placed patient on full support. Patient tachycardic with desaturation. Settings per order vt 600 rr14 peep 5 30%.

## 2015-09-01 NOTE — Progress Notes (Signed)
Notified Kolluru of K3.3. Verbal order to initiate a 4K bath on CRRT.

## 2015-09-01 NOTE — Progress Notes (Addendum)
PULMONARY / CRITICAL CARE MEDICINE   Name: John Hoover MRN: [id number] DOB: 26-Apr-1966    ADMISSION DATE:  08/27/2015 CONSULTATION DATE:  08/28/15  REFERRING MD: Cherlynn KaiserSainani  PT PROFILE: Young previously healthy [age] M admitted to gen med floor with several days of malaise and profound myalgias. Influenza PCR positive. On admission was noted to be hemoconcentrated with AKI and elevated CPK. On 03/28 developed progressive AMS and found to have profound metabolic acidosis. Transferred to ICU and PCCM consulted for management of acidosis, rhabdomyolysis, MODS, shock. Intubated, CVL placed and HD cath placed on arrival to ICU. Empiric abx initiated  MAJOR EVENTS/TEST RESULTS: 03/27 Admitted as above 03/28 transferred to ICU, intubated, CVL placed, HD cath placed, vasopressors initiated, Renal consult, CRRT initiated, HCO3 gtt initiated, empiric abx initated 03/28 CT head: NAD 03/28 Echocardiogram: LVEF 60-65%, grade one diastolic dysfunction 03/28 CTAP: no acute findings 03/30 BLE venous US: no DVT 03/31 Acidosis much improved. CRRT stopped, HCO3 gtt stopped 03/31 Transition off midaz infusion to dexmedetomidine 04/01 CRRT resumed. Back on low dose fentanyl infusion. Failed SBT  INDWELLING DEVICES:: ETT 03/28 >>  L  CVL 03/28 >>  R femoral HD cath 03/28 >>  R femoral A-line 03/28 >>   MICRO DATA: Flu PCR 03/27 >> positive for A, neg for H1N1 MRSA PCR 03/28 >> NEG Urine 03/27 >> NEG Blood 03/28 >> NEG Resp 03/29 >> NOF HIV 04/01 >>   ANTIMICROBIALS:  Oseltamivir 03/27 >> 04/01 Vanc 03/28 >> 03/31 Cefepime 03/28 >>   SUBJECTIVE:  RASS -3 to +1. Intermittently F/C  VITAL SIGNS: BP 89/71 mmHg  Pulse 64  Temp(Src) 97.7 F (36.5 C) (Axillary)  Resp 21  Ht 6\' 1"  (1.854 m)  Wt 113.2 kg (249 lb 9 oz)  BMI 32.93 kg/m2  SpO2 100%  HEMODYNAMICS: CVP:  [11 mmHg-23 mmHg] 14 mmHg  VENTILATOR SETTINGS: Vent Mode:  [-] Spontaneous FiO2 (%):  [30 %-80 %] 40 % Set Rate:  [14  bmp] 14 bmp Vt Set:  [600 mL] 600 mL PEEP:  [5 cmH20] 5 cmH20 Pressure Support:  [10 cmH20] 10 cmH20  INTAKE / OUTPUT: I/O last 3 completed shifts: In: 3344.8 [I.V.:1544.8; NG/GT:1450; IV Piggyback:350] Out: 1060 [Urine:190; Other:870]  PHYSICAL EXAMINATION: General: RASS -2, WDWN Neuro: PERRL, EOMI, MAEs, DTRs symmetric HEENT: NCAT, WNL Cardiovascular: Regular, no murmurs  Lungs: copious purulent resp secretions, diffuse rhonchi Abdomen: soft, NT, ND, diminished BS Ext: minimal cyanosis of feet and hands, warm  LABS:  BMET  Recent Labs Lab 08/31/15 0915 08/31/15 1735 09/01/15 0435  NA 134* 133* 135  K 3.8 3.6 3.9  CL 104 102 102  CO2 24 27 29   BUN 47* 55* 66*  CREATININE 2.12* 2.50* 3.29*  GLUCOSE 153* 122* 145*    Electrolytes  Recent Labs Lab 08/30/15 0433  08/31/15 0447 08/31/15 0915 08/31/15 1735 09/01/15 0435  CALCIUM 6.6*  < > 7.4* 7.5* 7.2* 7.1*  MG 1.9  --  2.1  --   --  2.3  PHOS 3.3  --  2.3*  --   --  1.9*  < > = values in this interval not displayed.  CBC  Recent Labs Lab 08/30/15 1543 08/31/15 0447 09/01/15 0435  WBC 23.4* 18.9* 20.1*  HGB 11.9* 11.0* 10.3*  HCT 36.0* 32.5* 30.9*  PLT 28* 26* 56*    Coag's  Recent Labs Lab 08/28/15 1724 08/30/15 1543 08/31/15 0447  APTT 81*  --  34  INR 1.84 1.21  --  Sepsis Markers  Recent Labs Lab 08/28/15 1724 08/28/15 2133 08/30/15 1543 09/01/15 1024  LATICACIDVEN 10.1* 5.4* 5.3*  --   PROCALCITON  --   --   --  2.28    ABG  Recent Labs Lab 08/31/15 0930 09/01/15 0139 09/01/15 0950  PHART 7.51* 7.58* 7.54*  PCO2ART 32 29* 33  PO2ART 93 78* 75*    Liver Enzymes  Recent Labs Lab 08/31/15 0915 08/31/15 1735 09/01/15 0435  AST 495* 497* 442*  ALT 135* 147* 148*  ALKPHOS 37* 38 48  BILITOT 0.9 0.7 0.7  ALBUMIN 2.9* 2.3* 2.2*    Cardiac Enzymes  Recent Labs Lab 08/28/15 2133 08/29/15 0439 08/30/15 1543  TROPONINI 0.41* 0.36* 0.50*     Glucose  Recent Labs Lab 08/31/15 0733 08/31/15 1136 08/31/15 2332 08/31/15 2357 09/01/15 0716 09/01/15 1107  GLUCAP 130* 128* 65 155* 128* 177*    CXR: persistent RUL infiltrate/atx, LLL atx/inf    ASSESSMENT / PLAN:  PULMONARY A: Acute hypoxic resp failure Suspect VAP P:   Cont full vent support - settings reviewed and/or adjusted Wean in PSV mode as tolerated Cont vent bundle Daily SBT if/when meets criteria  CARDIOVASCULAR A:  Shock, recurrent P:  MAP goal > 65 mmHg Cont norepinephrine infusion Cont current dose of hydrocortisone  RENAL A:   AKI - likely ATN + NSAIDS Severe lactic acidosis, resolved P:   Monitor BMET intermittently Monitor I/Os Correct electrolytes as indicated CRRT per Renal service Trial of Lasix X 1 dose  GASTROINTESTINAL A:   Heme + stools P:   SUP: enteral PPI Cont TFs  HEMATOLOGIC A:   Polycythemia, resolved Mild anemia, acute blood loss - hemodynamically stable Severe thrombocytopenia - improving P:  DVT px: SCDs Monitor CBC intermittently Transfuse per usual guidelines  INFECTIOUS A:   Severe sepsis Vent assoc PNA P:   Monitor temp, WBC count Micro and abx as above  ENDOCRINE A:   Mild stress induced hyperglycemia P:   DC SSI CBG q 8 hrs Consider resuming SSI for glu > 180  NEUROLOGIC A:   Acute ICU acquired encephalopathy ICU/vent associated discomfort P:   RASS goal: 0, -1 Cont dexmedetomidine initiated 03/31 Cont low dose fentanyl infusion Cont PRN lorazepam Cont PRN fentanyl  Rhabdomyolysis - CK finally coming down Cont to monitor  FAMILY  - Updates: Wife updated in detail  CCM time: 40 mins The above time includes time spent in consultation with patient and/or family members and reviewing care plan on multidisciplinary rounds  Billy Fischer, MD PCCM service Mobile [phone] Pager [phone]    09/01/2015, 1:07 PM

## 2015-09-02 ENCOUNTER — Inpatient Hospital Stay: Payer: BLUE CROSS/BLUE SHIELD

## 2015-09-02 LAB — BASIC METABOLIC PANEL
ANION GAP: 7 (ref 5–15)
ANION GAP: 7 (ref 5–15)
ANION GAP: 7 (ref 5–15)
Anion gap: 7 (ref 5–15)
Anion gap: 10 (ref 5–15)
BUN: 71 mg/dL — AB (ref 6–20)
BUN: 73 mg/dL — ABNORMAL HIGH (ref 6–20)
BUN: 85 mg/dL — ABNORMAL HIGH (ref 6–20)
BUN: 84 mg/dL — AB (ref 6–20)
BUN: 80 mg/dL — AB (ref 6–20)
CALCIUM: 7.2 mg/dL — AB (ref 8.9–10.3)
CALCIUM: 7.3 mg/dL — AB (ref 8.9–10.3)
CALCIUM: 7.2 mg/dL — AB (ref 8.9–10.3)
CHLORIDE: 104 mmol/L (ref 101–111)
CHLORIDE: 104 mmol/L (ref 101–111)
CO2: 25 mmol/L (ref 22–32)
CO2: 26 mmol/L (ref 22–32)
CO2: 25 mmol/L (ref 22–32)
CO2: 24 mmol/L (ref 22–32)
CO2: 26 mmol/L (ref 22–32)
Calcium: 7.1 mg/dL — ABNORMAL LOW (ref 8.9–10.3)
Calcium: 7.6 mg/dL — ABNORMAL LOW (ref 8.9–10.3)
Chloride: 102 mmol/L (ref 101–111)
Chloride: 104 mmol/L (ref 101–111)
Chloride: 101 mmol/L (ref 101–111)
Creatinine, Ser: 3.23 mg/dL — ABNORMAL HIGH (ref 0.61–1.24)
Creatinine, Ser: 3.44 mg/dL — ABNORMAL HIGH (ref 0.61–1.24)
Creatinine, Ser: 3.82 mg/dL — ABNORMAL HIGH (ref 0.61–1.24)
Creatinine, Ser: 3.62 mg/dL — ABNORMAL HIGH (ref 0.61–1.24)
Creatinine, Ser: 3.59 mg/dL — ABNORMAL HIGH (ref 0.61–1.24)
GFR calc Af Amer: 24 mL/min — ABNORMAL LOW (ref >60)
GFR calc Af Amer: 21 mL/min — ABNORMAL LOW (ref >60)
GFR calc Af Amer: 21 mL/min — ABNORMAL LOW (ref >60)
GFR calc non Af Amer: 21 mL/min — ABNORMAL LOW (ref >60)
GFR calc non Af Amer: 19 mL/min — ABNORMAL LOW (ref >60)
GFR calc non Af Amer: 17 mL/min — ABNORMAL LOW (ref >60)
GFR, EST AFRICAN AMERICAN: 22 mL/min — AB (ref >60)
GFR, EST AFRICAN AMERICAN: 20 mL/min — AB (ref >60)
GFR, EST NON AFRICAN AMERICAN: 18 mL/min — AB (ref >60)
GFR, EST NON AFRICAN AMERICAN: 18 mL/min — AB (ref >60)
Glucose, Bld: 135 mg/dL — ABNORMAL HIGH (ref 65–99)
Glucose, Bld: 144 mg/dL — ABNORMAL HIGH (ref 65–99)
Glucose, Bld: 158 mg/dL — ABNORMAL HIGH (ref 65–99)
Glucose, Bld: 177 mg/dL — ABNORMAL HIGH (ref 65–99)
Glucose, Bld: 156 mg/dL — ABNORMAL HIGH (ref 65–99)
POTASSIUM: 3.6 mmol/L (ref 3.5–5.1)
POTASSIUM: 3.6 mmol/L (ref 3.5–5.1)
POTASSIUM: 3.2 mmol/L — AB (ref 3.5–5.1)
POTASSIUM: 3.4 mmol/L — AB (ref 3.5–5.1)
Potassium: 3.6 mmol/L (ref 3.5–5.1)
SODIUM: 135 mmol/L (ref 135–145)
SODIUM: 137 mmol/L (ref 135–145)
Sodium: 136 mmol/L (ref 135–145)
Sodium: 137 mmol/L (ref 135–145)
Sodium: 134 mmol/L — ABNORMAL LOW (ref 135–145)

## 2015-09-02 LAB — CBC
HEMATOCRIT: 28.9 % — AB (ref 40.0–52.0)
HEMOGLOBIN: 9.5 g/dL — AB (ref 13.0–18.0)
MCH: 30.1 pg (ref 26.0–34.0)
MCHC: 32.8 g/dL (ref 32.0–36.0)
MCV: 91.9 fL (ref 80.0–100.0)
Platelets: 33 10*3/uL — ABNORMAL LOW (ref 150–440)
RBC: 3.15 MIL/uL — AB (ref 4.40–5.90)
RDW: 14.9 % — ABNORMAL HIGH (ref 11.5–14.5)
WBC: 19.5 10*3/uL — ABNORMAL HIGH (ref 3.8–10.6)

## 2015-09-02 LAB — CULTURE, BLOOD (ROUTINE X 2)
CULTURE: NO GROWTH
Culture: NO GROWTH

## 2015-09-02 LAB — ABO/RH: ABO/RH(D): O POS

## 2015-09-02 LAB — GLUCOSE, CAPILLARY
GLUCOSE-CAPILLARY: 128 mg/dL — AB (ref 65–99)
GLUCOSE-CAPILLARY: 129 mg/dL — AB (ref 65–99)
GLUCOSE-CAPILLARY: 132 mg/dL — AB (ref 65–99)

## 2015-09-02 LAB — C DIFFICILE QUICK SCREEN W PCR REFLEX
C DIFFICLE (CDIFF) ANTIGEN: NEGATIVE
C Diff interpretation: NEGATIVE
C Diff toxin: NEGATIVE

## 2015-09-02 LAB — PHOSPHORUS: Phosphorus: 3 mg/dL (ref 2.5–4.6)

## 2015-09-02 LAB — TROPONIN I: TROPONIN I: 0.32 ng/mL — AB (ref <0.031)

## 2015-09-02 LAB — PROCALCITONIN: Procalcitonin: 2.12 ng/mL

## 2015-09-02 LAB — MAGNESIUM: Magnesium: 2.2 mg/dL (ref 1.7–2.4)

## 2015-09-02 LAB — HIV ANTIBODY (ROUTINE TESTING W REFLEX): HIV SCREEN 4TH GENERATION: NONREACTIVE

## 2015-09-02 LAB — TRIGLYCERIDES: Triglycerides: 507 mg/dL — ABNORMAL HIGH (ref <150)

## 2015-09-02 LAB — CK: Total CK: 17243 U/L — ABNORMAL HIGH (ref 49–397)

## 2015-09-02 LAB — LACTATE DEHYDROGENASE: LDH: 912 U/L — ABNORMAL HIGH (ref 98–192)

## 2015-09-02 MED ORDER — VECURONIUM BROMIDE 10 MG IV SOLR
10.0000 mg | Freq: Once | INTRAVENOUS | Status: AC
  Administered 2015-09-02: 10 mg via INTRAVENOUS

## 2015-09-02 MED ORDER — VANCOMYCIN 50 MG/ML ORAL SOLUTION
125.0000 mg | Freq: Four times a day (QID) | ORAL | Status: DC
  Administered 2015-09-02 (×2): 125 mg
  Filled 2015-09-02 (×8): qty 2.5

## 2015-09-02 MED ORDER — VECURONIUM BROMIDE 10 MG IV SOLR
INTRAVENOUS | Status: AC
  Administered 2015-09-02: 10 mg
  Filled 2015-09-02: qty 10

## 2015-09-02 MED ORDER — MIDAZOLAM HCL 2 MG/2ML IJ SOLN
2.0000 mg | INTRAMUSCULAR | Status: DC | PRN
  Administered 2015-09-02 – 2015-09-09 (×8): 2 mg via INTRAVENOUS
  Filled 2015-09-02 (×10): qty 2

## 2015-09-02 MED ORDER — MIDAZOLAM HCL 2 MG/2ML IJ SOLN
INTRAMUSCULAR | Status: AC
  Filled 2015-09-02: qty 2

## 2015-09-02 MED ORDER — FENTANYL BOLUS VIA INFUSION
100.0000 ug | INTRAVENOUS | Status: AC | PRN
  Administered 2015-09-02 (×3): 100 ug via INTRAVENOUS
  Filled 2015-09-02: qty 100

## 2015-09-02 MED ORDER — VANCOMYCIN HCL IN DEXTROSE 750-5 MG/150ML-% IV SOLN
750.0000 mg | INTRAVENOUS | Status: DC
  Administered 2015-09-03: 750 mg via INTRAVENOUS
  Filled 2015-09-02 (×2): qty 150

## 2015-09-02 MED ORDER — SODIUM CHLORIDE 0.9 % IV SOLN: Freq: Once | INTRAVENOUS | Status: DC

## 2015-09-02 MED ORDER — BISACODYL 10 MG RE SUPP
10.0000 mg | Freq: Every day | RECTAL | Status: DC | PRN
  Administered 2015-09-02: 10 mg via RECTAL
  Filled 2015-09-02: qty 1

## 2015-09-02 MED ORDER — STERILE WATER FOR INJECTION IJ SOLN
INTRAMUSCULAR | Status: AC
  Administered 2015-09-02: 10 mL
  Filled 2015-09-02: qty 20

## 2015-09-02 MED ORDER — PROPOFOL 1000 MG/100ML IV EMUL
0.0000 ug/kg/min | INTRAVENOUS | Status: DC
  Administered 2015-09-02: 20 ug/kg/min via INTRAVENOUS
  Filled 2015-09-02 (×2): qty 100

## 2015-09-02 MED ORDER — HYDROCORTISONE NA SUCCINATE PF 100 MG IJ SOLR
25.0000 mg | Freq: Two times a day (BID) | INTRAMUSCULAR | Status: DC
Start: 2015-09-02 — End: 2015-09-02

## 2015-09-02 MED ORDER — ALTEPLASE 2 MG IJ SOLR
2.0000 mg | Freq: Once | INTRAMUSCULAR | Status: AC
  Administered 2015-09-02: 2 mg
  Filled 2015-09-02: qty 2

## 2015-09-02 MED ORDER — ALTEPLASE 2 MG IJ SOLR
2.0000 mg | Freq: Once | INTRAMUSCULAR | Status: DC
  Filled 2015-09-02: qty 2

## 2015-09-02 MED ORDER — MIDAZOLAM HCL 2 MG/2ML IJ SOLN: 2.0000 mg | INTRAMUSCULAR | Status: DC | PRN

## 2015-09-02 MED ORDER — POTASSIUM CHLORIDE 10 MEQ/100ML IV SOLN
10.0000 meq | INTRAVENOUS | Status: DC
  Filled 2015-09-02 (×2): qty 100

## 2015-09-02 MED ORDER — SODIUM CHLORIDE 0.9 % IV SOLN
0.0000 mg/h | INTRAVENOUS | Status: DC
  Administered 2015-09-02: 3 mg/h via INTRAVENOUS
  Administered 2015-09-02: 2 mg/h via INTRAVENOUS
  Administered 2015-09-03 – 2015-09-05 (×4): 4 mg/h via INTRAVENOUS
  Administered 2015-09-05: 5 mg/h via INTRAVENOUS
  Administered 2015-09-06: 7 mg/h via INTRAVENOUS
  Administered 2015-09-06 (×2): 8 mg/h via INTRAVENOUS
  Administered 2015-09-07 – 2015-09-08 (×8): 10 mg/h via INTRAVENOUS
  Administered 2015-09-09: 8 mg/h via INTRAVENOUS
  Administered 2015-09-09: 10 mg/h via INTRAVENOUS
  Administered 2015-09-09: 9 mg/h via INTRAVENOUS
  Administered 2015-09-09 – 2015-09-10 (×2): 10 mg/h via INTRAVENOUS
  Filled 2015-09-02 (×25): qty 10

## 2015-09-02 MED ORDER — VANCOMYCIN HCL IN DEXTROSE 750-5 MG/150ML-% IV SOLN
750.0000 mg | Freq: Once | INTRAVENOUS | Status: AC
  Administered 2015-09-02: 750 mg via INTRAVENOUS
  Filled 2015-09-02: qty 150

## 2015-09-02 NOTE — Consult Note (Signed)
Pharmacy Antibiotic Note  John Hoover is a 50 y.o. male admitted on 08/27/2015 with sepsis.  Pharmacy has been consulted for vancomycin dosing.  Plan: Vancomycin 750mg  IV every 24 hours.  Goal trough 15-20 mcg/mL. Will use stacked dosing (9.5 hrs) and check trough prior to 4th dose at 0000 on 4/5.   Ke= 0.029 hrs-1 T1/2= 23.9 hrs Vd= 44.66 L Cpk= 32.4, Ctr= 16.6  Height: 6\' 1"  (185.4 cm) Weight: 249 lb 9 oz (113.2 kg) IBW/kg (Calculated) : 79.9  Temp (24hrs), Avg:98.7 F (37.1 C), Min:96.4 F (35.8 C), Max:100.2 F (37.9 C)   Recent Labs Lab 08/28/15 1724 08/28/15 2133  08/30/15 0433  08/30/15 1543  08/31/15 0447  09/01/15 0435  09/01/15 1832 09/01/15 2118 09/02/15 0300 09/02/15 0519 09/02/15 1207  WBC  --   --   < > 24.3*  --  23.4*  --  18.9*  --  20.1*  --   --   --   --  19.5*  --   CREATININE 3.01* 3.00*  < > 2.21*  < > 2.52*  < > 2.24*  < > 3.29*  < > 3.37* 3.22* 3.23* 3.44* 3.82*  LATICACIDVEN 10.1* 5.4*  --   --   --  5.3*  --   --   --   --   --   --   --   --   --   --   VANCOTROUGH  --   --   --   --   --  8*  --   --   --   --   --   --   --   --   --   --   < > = values in this interval not displayed.  Estimated Creatinine Clearance: 30.2 mL/min (by C-G formula based on Cr of 3.82).    No Known Allergies  Antimicrobials this admission: Cefepime 03/29 >>  Vancomycin 3/29 x1, then 4/2 >>  Oseltamivir 3/30>>3/31  Microbiology results: 3/28 BCx x2: NGTD 3/31 Sputum: FEW WBC SEEN  MANY GRAM POSITIVE RODS  MODERATE GRAM POSITIVE COCCI  RARE YEAST  3/28 MRSA PCR: (-)  Thank you for allowing pharmacy to be a part of this patient's care.  John Hoover 09/02/2015 2:54 PM

## 2015-09-02 NOTE — Progress Notes (Signed)
Patient became distressed while being rolled from side to side.  Vent changed to prvc by nurse practitioner. Will cont to monitor.

## 2015-09-02 NOTE — Progress Notes (Signed)
[facility]  ROUNDING NOTE   Subjective:   Venous line not functioning. CRRT held since Midnight. ++anasarca UOP 120 Norepinephrine gtt  Objective:  Vital signs in last 24 hours:  Temp:  [96.4 F (35.8 C)-99.7 F (37.6 C)] 99.7 F (37.6 C) (04/02 0700) Pulse Rate:  [53-88] 80 (04/02 0700) Resp:  [12-25] 17 (04/02 0700) BP: (86-133)/(56-99) 88/56 mmHg (04/02 0700) SpO2:  [89 %-100 %] 94 % (04/02 0700) Arterial Line BP: (83-141)/(50-84) 128/79 mmHg (04/01 1800) FiO2 (%):  [30 %-80 %] 50 % (04/02 0438) Weight:  [113.2 kg (249 lb 9 oz)] 113.2 kg (249 lb 9 oz) (04/02 0500)  Weight change:  Filed Weights   08/30/15 0500 08/31/15 0445 09/02/15 0500  Weight: 109.6 kg (241 lb 10 oz) 113.2 kg (249 lb 9 oz) 113.2 kg (249 lb 9 oz)    Intake/Output: I/O last 3 completed shifts: In: 1084.1 [I.V.:804.1; NG/GT:130; IV Piggyback:150] Out: 003 [Urine:140; Other:446]   Intake/Output this shift:     Physical Exam: General: Critically ill   Head: +ETT +OGT  Eyes: Eyes closed, PERRL  Neck:  trachea midline, left subclavian triple lumen  Lungs:  Clear to auscultation, PRVC FiO2 50%  Heart: regular  Abdomen:  Soft, nontender  Extremities: ++edema  Neurologic: Sedated, intubated.   Skin: No lesions  Access: Right femoral temp HD catheter 3/28 Dr. Stevenson Clinch    Basic Metabolic Panel:  Recent Labs Lab 08/29/15 0439  08/30/15 7048  08/31/15 0447  09/01/15 0435 09/01/15 1517 09/01/15 1832 09/01/15 2118 09/02/15 0300 09/02/15 0519  NA 132*  < > 134*  < > 134*  < > 135 134* 135 135 136 137  K 4.6  < > 3.8  < > 3.7  < > 3.9 3.8 3.5 3.3* 3.6 3.6  CL 102  < > 105  < > 103  < > 102 100* 102 102 104 104  CO2 20*  < > 21*  < > 23  < > _0 GLUCOSE 154*  < > 174*  < > 152*  < > 145* 191* 181* 187* 135* 144*  BUN 50*  < > 45*  < > 51*  < > 66* 70* 70* 70* 71* 73*  CREATININE 2.48*  < > 2.21*  < > 2.24*  < > 3.29* 3.35* 3.37* 3.22* 3.23* 3.44*  CALCIUM 7.0*  < >  6.6*  < > 7.4*  < > 7.1* 7.1* 7.2* 7.3* 7.2* 7.3*  MG 1.8  --  1.9  --  2.1  --  2.3  --   --   --   --  2.2  PHOS 5.6*  --  3.3  --  2.3*  --  1.9*  --   --   --   --  3.0  < > = values in this interval not displayed.  Liver Function Tests:  Recent Labs Lab 08/30/15 1043 08/30/15 1543 08/31/15 0915 08/31/15 1735 09/01/15 0435  AST 246* 296* 495* 497* 442*  ALT 74* 87* 135* 147* 148*  ALKPHOS 37* 34* 37* 38 48  BILITOT 0.7 0.8 0.9 0.7 0.7  PROT 3.4* 3.7* 4.4* 4.0* 4.1*  ALBUMIN 1.6* 2.0* 2.9* 2.3* 2.2*    Recent Labs Lab 08/31/15 0915  LIPASE 11  AMYLASE 29   No results for input(s): AMMONIA in the last 168 hours.  CBC:  Recent Labs Lab 08/27/15 1221  08/30/15 8891 08/30/15 1543 08/31/15 6945 09/01/15 0435 09/02/15 0388  WBC 11.0*  < > 24.3* 23.4* 18.9* 20.1* 19.5*  NEUTROABS 8.1*  --   --  21.7* 16.6*  --   --   HGB 19.7*  < > 13.4 11.9* 11.0* 10.3* 9.5*  HCT 59.7*  < > 41.0 36.0* 32.5* 30.9* 28.9*  MCV 90.1  < > 91.3 89.3 89.0 90.7 91.9  PLT 125*  < > 41* 28* 26* 56* 33*  < > = values in this interval not displayed.  Cardiac Enzymes:  Recent Labs Lab 08/28/15 0007  08/28/15 1721  08/28/15 2133 08/29/15 0439  08/30/15 1043 08/30/15 1543 08/31/15 0915 09/01/15 0435 09/02/15 0519  CKTOTAL  --   < >  --   < >  --  7210*  < > 10420* 14001* 22702* 16165* 17243*  CKMB  --   --   --   --   --   --   --   --  144.6*  --   --   --   TROPONINI 0.33*  --  0.44*  --  0.41* 0.36*  --   --  0.50*  --   --   --   < > = values in this interval not displayed.  BNP: Invalid input(s): POCBNP  CBG:  Recent Labs Lab 09/01/15 0716 09/01/15 1107 09/01/15 1622 09/01/15 1930 09/02/15 0731  GLUCAP 128* 177* 39 162* 129*    Microbiology: Results for orders placed or performed during the hospital encounter of 08/27/15  Rapid Influenza A&B Antigens ([facility] only)     Status: None   Collection Time: 08/27/15 12:22 PM  Result Value Ref Range Status   Influenza A  ([place]) NEGATIVE NEGATIVE Final   Influenza B ([facility]) NEGATIVE NEGATIVE Final  Urine culture     Status: None   Collection Time: 08/27/15  7:40 PM  Result Value Ref Range Status   Specimen Description URINE, RANDOM  Final   Special Requests Normal  Final   Culture NO GROWTH 1 DAY  Final   Report Status 08/29/2015 FINAL  Final  MRSA PCR Screening     Status: None   Collection Time: 08/28/15  5:25 PM  Result Value Ref Range Status   MRSA by PCR NEGATIVE NEGATIVE Final    Comment:        The GeneXpert MRSA Assay (FDA approved for NASAL specimens only), is one component of a comprehensive MRSA colonization surveillance program. It is not intended to diagnose MRSA infection nor to guide or monitor treatment for MRSA infections.   Culture, blood (Routine X 2) w Reflex to ID Panel     Status: None   Collection Time: 08/28/15  5:51 PM  Result Value Ref Range Status   Specimen Description BLOOD RIGHT HAND  Final   Special Requests BAA,ANA,AER,5ML  Final   Culture NO GROWTH 5 DAYS  Final   Report Status 09/02/2015 FINAL  Final  Culture, blood (Routine X 2) w Reflex to ID Panel     Status: None   Collection Time: 08/28/15  5:51 PM  Result Value Ref Range Status   Specimen Description BLOOD RIGHT ASSIST CONTROL  Final   Special Requests BAA,ANA,AER,5ML  Final   Culture NO GROWTH 5 DAYS  Final   Report Status 09/02/2015 FINAL  Final  Culture, respiratory (NON-Expectorated)     Status: None   Collection Time: 08/29/15  2:16 PM  Result Value Ref Range Status   Specimen Description TRACHEAL ASPIRATE  Final   Special Requests NONE  Final  Gram Stain FEW WBC SEEN FEW YEAST   Final   Culture Consistent with normal respiratory flora.  Final   Report Status 09/01/2015 FINAL  Final  Culture, respiratory (NON-Expectorated)     Status: None (Preliminary result)   Collection Time: 08/31/15  3:45 PM  Result Value Ref Range Status   Specimen Description TRACHEAL ASPIRATE  Final   Special  Requests NONE  Final   Gram Stain   Final    FEW WBC SEEN MANY GRAM POSITIVE RODS MODERATE GRAM POSITIVE COCCI RARE YEAST    Culture HOLDING FOR POSSIBLE PATHOGEN  Final   Report Status PENDING  Incomplete    Coagulation Studies:  Recent Labs  08/30/15 1543  LABPROT 15.5*  INR 1.21    Urinalysis: No results for input(s): COLORURINE, LABSPEC, PHURINE, GLUCOSEU, HGBUR, BILIRUBINUR, KETONESUR, PROTEINUR, UROBILINOGEN, NITRITE, LEUKOCYTESUR in the last 72 hours.  Invalid input(s): APPERANCEUR    Imaging: Dg Abd 1 View  09/02/2015  CLINICAL DATA:  Acute onset of generalized abdominal distention. Nasogastric tube and femoral line placement. Initial encounter. EXAM: ABDOMEN - 1 VIEW COMPARISON:  Abdominal radiograph performed 08/30/2015 FINDINGS: The patient's enteric tube is noted ending overlying the body of the stomach, with the side port about the fundus of the stomach. A right femoral dual-lumen catheter is noted ending overlying the mid abdomen. A smaller right femoral catheter is also seen. A rectal probe is also noted overlying the pelvis. The visualized bowel gas pattern is unremarkable. Scattered air and stool filled loops of colon are seen; no abnormal dilatation of small bowel loops is seen to suggest small bowel obstruction. No free intra-abdominal air is identified, though evaluation for free air is limited on supine views. The visualized osseous structures are within normal limits; the sacroiliac joints are unremarkable in appearance. IMPRESSION: 1. Enteric tube noted ending overlying the body of the stomach, with the side port about the fundus of the stomach. 2. Stop Right femoral catheters noted as described above. 3. Unremarkable bowel gas pattern; no free intra-abdominal air seen. Electronically Signed   By: Garald Balding M.D.   On: 09/02/2015 01:38   Dg Chest Port 1 View  09/01/2015  CLINICAL DATA:  Acute onset of respiratory failure. Initial encounter. EXAM: PORTABLE CHEST  1 VIEW COMPARISON:  Chest radiograph performed 08/31/2015 FINDINGS: The patient's endotracheal tube is seen ending 3 cm above the carina. An enteric tube is noted extending below the diaphragm. A left subclavian line is noted ending about the mid SVC. Increasing right upper lobe and left midlung airspace opacities are concerning for multifocal pneumonia. No definite pleural effusion or pneumothorax is seen. The cardiomediastinal silhouette is borderline enlarged. No acute osseous abnormalities are identified. IMPRESSION: 1. Endotracheal tube seen ending 3 cm above the carina. 2. Increasing right upper lobe and left midlung airspace opacities are concerning for multifocal pneumonia. 3. Borderline cardiomegaly. Electronically Signed   By: Garald Balding M.D.   On: 09/01/2015 01:47   Dg Chest Port 1 View  08/31/2015  CLINICAL DATA:  Acute respiratory failure. On ventilator. Acute renal failure. Hypovolemic shock. EXAM: PORTABLE CHEST 1 VIEW COMPARISON:  08/28/2015 FINDINGS: Endotracheal tube, left subclavian central venous catheter and nasogastric tube remain in appropriate position. No pneumothorax visualized. Patient is rotated to the right. Increased bilateral airspace disease is seen with central perihilar predominance suspicious for pulmonary edema, although infection cannot definitely be excluded. Small bilateral pleural effusions cannot be excluded on this portable exam. Heart size remains stable. IMPRESSION: Increased bilateral airspace  disease, suspicious for pulmonary edema, although infection cannot be excluded. Electronically Signed   By: Earle Gell M.D.   On: 08/31/2015 12:05     Medications:   . dexmedetomidine 0.8 mcg/kg/hr (09/02/15 0550)  . feeding supplement (VITAL 1.5 CAL) 1,000 mL (08/31/15 1755)  . fentaNYL infusion INTRAVENOUS 300 mcg/hr (09/02/15 0335)  . norepinephrine (LEVOPHED) Adult infusion 4 mcg/min (09/01/15 2006)   . alteplase  2 mg Intracatheter Once  . antiseptic oral  rinse  7 mL Mouth Rinse QID  . ceFEPime (MAXIPIME) IV  2 g Intravenous Q12H  . chlorhexidine gluconate (SAGE KIT)  15 mL Mouth Rinse BID  . feeding supplement (PRO-STAT SUGAR FREE 64)  30 mL Oral QID  . free water  30 mL Per Tube 6 times per day  . hydrocortisone sod succinate (SOLU-CORTEF) inj  50 mg Intravenous Q12H  . pantoprazole sodium  40 mg Per Tube Q1200  . polyethylene glycol  17 g Per NG tube Daily   acetaminophen **OR** acetaminophen, albuterol, bisacodyl, fentaNYL, heparin, midazolam, midazolam, sodium chloride, sodium chloride flush  Assessment/ Plan:  John Hoover is a 50 y.o. white male with no specific past medical history, who was admitted to [facility] on 08/27/2015  1. Acute Renal Failure 2. Metabolic Acidosis 3. Rhabdomyolysis 4. Acute respiratory failure 5. Influenza A positive 6. Hypoalbuminemia  Plan Patient with anuric to oliguric urine output, anasarca, requiring vasopressors  Restart CRRT when catheter replaced: CVVHD 4K bath, BFR 300 DFR 2.5 litre. UF 50 - would like to increase UF as needed pending vasopressor requirements.  q 4 hour BMP.  ABG pending.     LOS: 6 Neesha Langton 4/2/20177:50 AM

## 2015-09-02 NOTE — Progress Notes (Signed)
Patient's family updated on patient's condition.

## 2015-09-02 NOTE — Progress Notes (Signed)
LE: Platelets infusing. Dr. Sung AmabileSimonds at bedside, initiated insertion of L IJ vascular catheter for hemodialysis access.

## 2015-09-02 NOTE — Progress Notes (Signed)
MEDICATION RELATED CONSULT NOTE - FOLLOW UP   Pharmacy Consult for CRRT Dosing/Cefepime dosing    No Known Allergies  Patient Measurements: Height: _0  (185.4 cm) Weight: 249 lb 9 oz (113.2 kg) IBW/kg (Calculated) : 79.9  Vital Signs: Temp: 99.7 F (37.6 C) (04/02 0700) BP: 88/56 mmHg (04/02 0700) Pulse Rate: 80 (04/02 0700) Intake/Output from previous day: 04/01 0701 - 04/02 0700 In: 777.4 [I.V.:677.4; IV Piggyback:100] Out: 566 [Urine:120] Intake/Output from this shift:    Labs:  Recent Labs  08/31/15 0447 08/31/15 0915 08/31/15 1735 09/01/15 0435  09/01/15 2118 09/02/15 0300 09/02/15 0519  WBC 18.9*  --   --  20.1*  --   --   --  19.5*  HGB 11.0*  --   --  10.3*  --   --   --  9.5*  HCT 32.5*  --   --  30.9*  --   --   --  28.9*  PLT 26*  --   --  56*  --   --   --  33*  APTT 34  --   --   --   --   --   --   --   CREATININE 2.24* 2.12* 2.50* 3.29*  < > 3.22* 3.23* 3.44*  MG 2.1  --   --  2.3  --   --   --  2.2  PHOS 2.3*  --   --  1.9*  --   --   --  3.0  ALBUMIN  --  2.9* 2.3* 2.2*  --   --   --   --   PROT  --  4.4* 4.0* 4.1*  --   --   --   --   AST  --  495* 497* 442*  --   --   --   --   ALT  --  135* 147* 148*  --   --   --   --   ALKPHOS  --  37* 38 48  --   --   --   --   BILITOT  --  0.9 0.7 0.7  --   --   --   --   BILIDIR  --  0.3  --   --   --   --   --   --   IBILI  --  0.6  --   --   --   --   --   --   < > = values in this interval not displayed. Estimated Creatinine Clearance: 33.5 mL/min (by C-G formula based on Cr of 3.44).   Microbiology: Recent Results (from the past 720 hour(s))  Rapid Influenza A&B Antigens ([place] only)     Status: None   Collection Time: 08/27/15 12:22 PM  Result Value Ref Range Status   Influenza A ([place]) NEGATIVE NEGATIVE Final   Influenza B ([facility]) NEGATIVE NEGATIVE Final  Urine culture     Status: None   Collection Time: 08/27/15  7:40 PM  Result Value Ref Range Status   Specimen Description URINE, RANDOM   Final   Special Requests Normal  Final   Culture NO GROWTH 1 DAY  Final   Report Status 08/29/2015 FINAL  Final  MRSA PCR Screening     Status: None   Collection Time: 08/28/15  5:25 PM  Result Value Ref Range Status   MRSA by PCR NEGATIVE NEGATIVE Final    Comment:  The GeneXpert MRSA Assay (FDA approved for NASAL specimens only), is one component of a comprehensive MRSA colonization surveillance program. It is not intended to diagnose MRSA infection nor to guide or monitor treatment for MRSA infections.   Culture, blood (Routine X 2) w Reflex to ID Panel     Status: None   Collection Time: 08/28/15  5:51 PM  Result Value Ref Range Status   Specimen Description BLOOD RIGHT HAND  Final   Special Requests BAA,ANA,AER,5ML  Final   Culture NO GROWTH 5 DAYS  Final   Report Status 09/02/2015 FINAL  Final  Culture, blood (Routine X 2) w Reflex to ID Panel     Status: None   Collection Time: 08/28/15  5:51 PM  Result Value Ref Range Status   Specimen Description BLOOD RIGHT ASSIST CONTROL  Final   Special Requests BAA,ANA,AER,5ML  Final   Culture NO GROWTH 5 DAYS  Final   Report Status 09/02/2015 FINAL  Final  Culture, respiratory (NON-Expectorated)     Status: None   Collection Time: 08/29/15  2:16 PM  Result Value Ref Range Status   Specimen Description TRACHEAL ASPIRATE  Final   Special Requests NONE  Final   Gram Stain FEW WBC SEEN FEW YEAST   Final   Culture Consistent with normal respiratory flora.  Final   Report Status 09/01/2015 FINAL  Final  Culture, respiratory (NON-Expectorated)     Status: None (Preliminary result)   Collection Time: 08/31/15  3:45 PM  Result Value Ref Range Status   Specimen Description TRACHEAL ASPIRATE  Final   Special Requests NONE  Final   Gram Stain   Final    FEW WBC SEEN MANY GRAM POSITIVE RODS MODERATE GRAM POSITIVE COCCI RARE YEAST    Culture HOLDING FOR POSSIBLE PATHOGEN  Final   Report Status PENDING  Incomplete     Medical History: Past Medical History  Diagnosis Date  . Meniere's disease     Medications:  Scheduled:  . sodium chloride   Intravenous Once  . antiseptic oral rinse  7 mL Mouth Rinse QID  . ceFEPime (MAXIPIME) IV  2 g Intravenous Q12H  . chlorhexidine gluconate (SAGE KIT)  15 mL Mouth Rinse BID  . feeding supplement (PRO-STAT SUGAR FREE 64)  30 mL Oral QID  . free water  30 mL Per Tube 6 times per day  . hydrocortisone sod succinate (SOLU-CORTEF) inj  25 mg Intravenous Q12H  . pantoprazole sodium  40 mg Per Tube Q1200  . polyethylene glycol  17 g Per NG tube Daily   Infusions:  . feeding supplement (VITAL 1.5 CAL) 1,000 mL (08/31/15 1755)  . fentaNYL infusion INTRAVENOUS 300 mcg/hr (09/02/15 0335)  . norepinephrine (LEVOPHED) Adult infusion 4 mcg/min (09/01/15 2006)  . propofol (DIPRIVAN) infusion 20 mcg/kg/min (09/02/15 3875)    Assessment: Pharmacy consulted to assist in adjusting medications for CRRT in this 50 y/o M with rhabdomyolysis. Patient is currently on day 6 of cefepime 2g IV Q12hr.    Cefepime 3/28 >> Vancomycin 3/29 >> 3/29 Oseltamivir 3/28 >> 4/1  Plan:  No further adjustments are necessary at this time.  Pharmacy will continue to monitor and adjust per consult.   Angelys Yetman D 09/02/2015,9:15 AM

## 2015-09-02 NOTE — Progress Notes (Signed)
LE: CRRT initiated. Pt cleaned up of large, loose bowel movement. Stool specimen collected for C-diff evaluation.

## 2015-09-02 NOTE — Progress Notes (Signed)
Pt temp 96.4, will place rectal probe and apply bair hugger.

## 2015-09-02 NOTE — Progress Notes (Signed)
Pt became very agitated, was thrashing in bed and trying to get out of bed. He became increasingly combative. He was mouthing "ouch" in pain while on the ventilator. He nodded yes he was in pain and yes his stomach hurt. NP Rebbeca PaulMagdalene was called, under her direction he received 6 of Versed, three 100 mcg boluses of Fentanyl, Fentanyl was increased to 300 mcg and Precedex to 1.2. Pt also was desatting to the mid 80's when all of this was occuring.

## 2015-09-02 NOTE — Procedures (Signed)
PROCEDURE NOTE: L IJ HD CATH PLACEMENT  INDICATION:    Need for CRRT  CONSENT:   Risks of procedure as well as the alternatives were explained to the patient or surrogate. Consent for procedure obtained. A time out was performed.   PROCEDURE  Sterile technique was used including antiseptics, cap, gloves, gown, hand hygiene, mask and full body sheet.  Skin prep: Chlorhexidine; local anesthetic administered  A dual lumen HD cath was placed in the L IJ vein using the Seldinger technique.  Ultrasound was used for vessel identification and guidance.   EVALUATION:  Blood flow good  Complications: No apparent complications  Patient tolerated the procedure well.  Chest X-ray ordered to verify placement and is pending   Billy Fischeravid Sharnay Cashion, MD PCCM service Mobile [phone]

## 2015-09-02 NOTE — Progress Notes (Addendum)
PULMONARY / CRITICAL CARE MEDICINE   Name: John Hoover MRN: [id number] DOB: 09/24/1965    ADMISSION DATE:  08/27/2015 CONSULTATION DATE:  08/28/15  REFERRING MD: Cherlynn KaiserSainani  PT PROFILE: Young previously healthy [age] M admitted to gen med floor with several days of malaise and profound myalgias. Influenza PCR positive. On admission was noted to be hemoconcentrated with AKI and elevated CPK. On 03/28 developed progressive AMS and found to have profound metabolic acidosis. Transferred to ICU and PCCM consulted for management of acidosis, rhabdomyolysis, MODS, shock. Intubated, CVL placed and HD cath placed on arrival to ICU. Empiric abx initiated  MAJOR EVENTS/TEST RESULTS: 03/27 Admitted as above 03/28 transferred to ICU, intubated, CVL placed, HD cath placed, vasopressors initiated, Renal consult, CRRT initiated, HCO3 gtt initiated, empiric abx initated 03/28 CT head: NAD 03/28 Echocardiogram: LVEF 60-65%, grade one diastolic dysfunction 03/28 CTAP: no acute findings 03/30 BLE venous US: no DVT 03/31 Acidosis much improved. CRRT stopped, HCO3 gtt stopped 03/31 Transition off midaz infusion to dexmedetomidine 04/01 CRRT resumed. Back on low dose fentanyl infusion. Failed SBT. Trial furosemide with minimal response 04/02 Increasing agitation. Dexmedetomidine transitioned to propofol. R femoral HD cath not functioning and replaced after platelet transfusion. CXR pattern c/w edema, severe LE and scrotal edema - volume removal via CRRT 04/02 Worsening gas exchange requiring increased vent support and heavier sedation. HD cath required replacement. Platelets transfused prior to placement. Wife apprised several times over course of day  INDWELLING DEVICES:: ETT 03/28 >>  L [place] CVL 03/28 >>  R femoral HD cath 03/28 >> 04/02 R femoral A-line 03/28 >>  R IJ HD cath 04/02 >>   MICRO DATA: Flu PCR 03/27 >> positive for A, neg for H1N1 MRSA PCR 03/28 >> NEG Urine 03/27 >> NEG Blood 03/28 >>  NEG Resp 03/29 >> NOF HIV 03/31 >> NEG Resp 03/31 >> "holding for possible pathogen" >> C diff 04/02 >> NEG Resp 04/02 >>    ANTIMICROBIALS:  Oseltamivir 03/27 >> 04/01 Vanc 03/28 >> 03/31 Cefepime 03/28 >>    SUBJECTIVE:  RASS -3 to +1. Intermittently F/C  VITAL SIGNS: BP 104/73 mmHg  Pulse 81  Temp(Src) 99.7 F (37.6 C) (Axillary)  Resp 14  Ht 6\' 1"  (1.854 m)  Wt 113.2 kg (249 lb 9 oz)  BMI 32.93 kg/m2  SpO2 94%  HEMODYNAMICS: CVP:  [14 mmHg-18 mmHg] 18 mmHg  VENTILATOR SETTINGS: Vent Mode:  [-] PRVC FiO2 (%):  [30 %-80 %] 50 % Set Rate:  [14 bmp] 14 bmp Vt Set:  [600 mL] 600 mL PEEP:  [5 cmH20] 5 cmH20 Pressure Support:  [10 cmH20] 10 cmH20  INTAKE / OUTPUT: I/O last 3 completed shifts: In: 1084.1 [I.V.:804.1; NG/GT:130; IV Piggyback:150] Out: 586 [Urine:140; Other:446]  PHYSICAL EXAMINATION: General: RASS -2, WDWN Neuro: PERRL, EOMI, MAEs, DTRs symmetric HEENT: NCAT, WNL Cardiovascular: Regular, no murmurs  Lungs: diffuse rhonchi, dependent crackles Abdomen: soft, NT, ND, diminished BS Ext: warm, no cyanosis, increasing LE edema GU: severe scrotal and penile edema  LABS:  BMET  Recent Labs Lab 09/01/15 2118 09/02/15 0300 09/02/15 0519  NA 135 136 137  K 3.3* 3.6 3.6  CL 102 104 104  CO2 26 25 26   BUN 70* 71* 73*  CREATININE 3.22* 3.23* 3.44*  GLUCOSE 187* 135* 144*    Electrolytes  Recent Labs Lab 08/31/15 0447  09/01/15 0435  09/01/15 2118 09/02/15 0300 09/02/15 0519  CALCIUM 7.4*  < > 7.1*  < > 7.3* 7.2* 7.3*  MG 2.1  --  2.3  --   --   --  2.2  PHOS 2.3*  --  1.9*  --   --   --  3.0  < > = values in this interval not displayed.  CBC  Recent Labs Lab 08/31/15 0447 09/01/15 0435 09/02/15 0519  WBC 18.9* 20.1* 19.5*  HGB 11.0* 10.3* 9.5*  HCT 32.5* 30.9* 28.9*  PLT 26* 56* 33*    Coag's  Recent Labs Lab 08/28/15 1724 08/30/15 1543 08/31/15 0447  APTT 81*  --  34  INR 1.84 1.21  --     Sepsis  Markers  Recent Labs Lab 08/28/15 1724 08/28/15 2133 08/30/15 1543 09/01/15 1024 09/02/15 0519  LATICACIDVEN 10.1* 5.4* 5.3*  --   --   PROCALCITON  --   --   --  2.28 2.12    ABG  Recent Labs Lab 09/01/15 0139 09/01/15 0950 09/02/15 0742  PHART 7.58* 7.54* 7.45  PCO2ART 29* 33 40  PO2ART 78* 75* 54*    Liver Enzymes  Recent Labs Lab 08/31/15 0915 08/31/15 1735 09/01/15 0435  AST 495* 497* 442*  ALT 135* 147* 148*  ALKPHOS 37* 38 48  BILITOT 0.9 0.7 0.7  ALBUMIN 2.9* 2.3* 2.2*    Cardiac Enzymes  Recent Labs Lab 08/28/15 2133 08/29/15 0439 08/30/15 1543  TROPONINI 0.41* 0.36* 0.50*    Glucose  Recent Labs Lab 08/31/15 2357 09/01/15 0716 09/01/15 1107 09/01/15 1622 09/01/15 1930 09/02/15 0731  GLUCAP 155* 128* 177* 83 162* 129*    CXR: increasing edema pattern   ASSESSMENT / PLAN:  PULMONARY A: Acute hypoxic resp failure Pulmonary edema Suspect VAP P:   Cont vent support - settings reviewed and/or adjusted Wean in PSV mode as tolerated Cont vent bundle Daily SBT if/when meets criteria Increase volume removal   CARDIOVASCULAR A:  Shock, recurrent P:  MAP goal > 65 mmHg Cont norepinephrine infusion Cont hydrocortisone - wean dose as permitted by BP  RENAL A:   AKI - likely ATN + NSAIDS Severe lactic acidosis, resolved Severe hypervolemia P:   Monitor BMET intermittently Monitor I/Os Correct electrolytes as indicated CRRT per Renal service (UF -100 cc/hr)  GASTROINTESTINAL A:   Heme + stools P:   SUP: enteral PPI Cont TFs  HEMATOLOGIC A:   Polycythemia, resolved Mild anemia, acute blood loss - hemodynamically stable Severe thrombocytopenia  P:  DVT px: SCDs Monitor CBC intermittently Transfuse per usual guidelines Platelet transfusion 04/02 prior to HD cath placement  INFECTIOUS A:   Severe sepsis Vent assoc PNA P:   Monitor temp, WBC count Micro and abx as above  ENDOCRINE A:   Mild stress  induced hyperglycemia Risk of hypoglycemia in setting of AKI P:   DC SSI CBG q 8 hrs Consider resuming SSI for glu > 180  NEUROLOGIC A:   Acute ICU acquired encephalopathy ICU/vent associated discomfort P:   RASS goal: -2, -3 Transition dex to midaz Cont fentanyl infusion Cont PRN midazolam Cont PRN fentanyl  Rhabdomyolysis - Unclear etiology. CK persistently elevated Cont to monitor and continue supportive care  FAMILY  - Updates: Wife updated in detail  CCM time: 45 mins The above time includes time spent in consultation with patient and/or family members and reviewing care plan on multidisciplinary rounds   This note was addended in the afternoon after further developments throughout the day and an additional 45 minutes of CCM time was applied  Billy Fischer, MD PCCM service Mobile [phone]  Pager [phone]    09/02/2015, 10:12 AM

## 2015-09-03 ENCOUNTER — Inpatient Hospital Stay: Payer: BLUE CROSS/BLUE SHIELD

## 2015-09-03 DIAGNOSIS — R69 Illness, unspecified: Secondary | ICD-10-CM

## 2015-09-03 DIAGNOSIS — J9601 Acute respiratory failure with hypoxia: Secondary | ICD-10-CM

## 2015-09-03 DIAGNOSIS — Z789 Other specified health status: Secondary | ICD-10-CM

## 2015-09-03 LAB — PREPARE PLATELET PHERESIS: Unit division: 0

## 2015-09-03 LAB — BASIC METABOLIC PANEL
Anion gap: 4 — ABNORMAL LOW (ref 5–15)
Anion gap: 4 — ABNORMAL LOW (ref 5–15)
BUN: 75 mg/dL — AB (ref 6–20)
BUN: 66 mg/dL — AB (ref 6–20)
CALCIUM: 7.2 mg/dL — AB (ref 8.9–10.3)
CALCIUM: 7.5 mg/dL — AB (ref 8.9–10.3)
CHLORIDE: 105 mmol/L (ref 101–111)
CO2: 28 mmol/L (ref 22–32)
CO2: 27 mmol/L (ref 22–32)
CREATININE: 3.56 mg/dL — AB (ref 0.61–1.24)
CREATININE: 3.11 mg/dL — AB (ref 0.61–1.24)
Chloride: 104 mmol/L (ref 101–111)
GFR calc non Af Amer: 22 mL/min — ABNORMAL LOW (ref >60)
GFR, EST AFRICAN AMERICAN: 21 mL/min — AB (ref >60)
GFR, EST AFRICAN AMERICAN: 25 mL/min — AB (ref >60)
GFR, EST NON AFRICAN AMERICAN: 18 mL/min — AB (ref >60)
Glucose, Bld: 126 mg/dL — ABNORMAL HIGH (ref 65–99)
Glucose, Bld: 133 mg/dL — ABNORMAL HIGH (ref 65–99)
Potassium: 4 mmol/L (ref 3.5–5.1)
Potassium: 4 mmol/L (ref 3.5–5.1)
SODIUM: 136 mmol/L (ref 135–145)
SODIUM: 136 mmol/L (ref 135–145)

## 2015-09-03 LAB — MISC LABCORP TEST (SEND OUT): LABCORP TEST CODE: 5595

## 2015-09-03 LAB — CBC
HCT: 28 % — ABNORMAL LOW (ref 40.0–52.0)
HEMOGLOBIN: 9.1 g/dL — AB (ref 13.0–18.0)
MCH: 30.3 pg (ref 26.0–34.0)
MCHC: 32.6 g/dL (ref 32.0–36.0)
MCV: 93 fL (ref 80.0–100.0)
Platelets: 66 10*3/uL — ABNORMAL LOW (ref 150–440)
RBC: 3.01 MIL/uL — ABNORMAL LOW (ref 4.40–5.90)
RDW: 15.2 % — AB (ref 11.5–14.5)
WBC: 19 10*3/uL — AB (ref 3.8–10.6)

## 2015-09-03 LAB — GLUCOSE, CAPILLARY
GLUCOSE-CAPILLARY: 93 mg/dL (ref 65–99)
GLUCOSE-CAPILLARY: 107 mg/dL — AB (ref 65–99)

## 2015-09-03 LAB — MAGNESIUM: MAGNESIUM: 2.1 mg/dL (ref 1.7–2.4)

## 2015-09-03 LAB — BLOOD GAS, ARTERIAL
ACID-BASE EXCESS: 3.5 mmol/L — AB (ref 0.0–3.0)
Acid-Base Excess: 1.2 mmol/L (ref 0.0–3.0)
Allens test (pass/fail): POSITIVE — AB
Bicarbonate: 27.8 mEq/L (ref 21.0–28.0)
Bicarbonate: 27.1 mEq/L (ref 21.0–28.0)
Delivery systems: 60
FIO2: 0.5
Mechanical Rate: 25
O2 SAT: 89.2 %
O2 SAT: 84.1 %
PATIENT TEMPERATURE: 37
PCO2 ART: 48 mmHg (ref 32.0–48.0)
PEEP/CPAP: 5 cmH2O
PEEP/CPAP: 14 cmH2O
PH ART: 7.45 (ref 7.350–7.450)
PO2 ART: 51 mmHg — AB (ref 83.0–108.0)
Patient temperature: 37
RATE: 14 resp/min
VT: 600 mL
VT: 600 mL
pCO2 arterial: 40 mmHg (ref 32.0–48.0)
pH, Arterial: 7.36 (ref 7.350–7.450)
pO2, Arterial: 54 mmHg — ABNORMAL LOW (ref 83.0–108.0)

## 2015-09-03 LAB — COMPREHENSIVE METABOLIC PANEL
ALBUMIN: 1.6 g/dL — AB (ref 3.5–5.0)
ALK PHOS: 84 U/L (ref 38–126)
ALT: 193 U/L — ABNORMAL HIGH (ref 17–63)
AST: 287 U/L — ABNORMAL HIGH (ref 15–41)
Anion gap: 6 (ref 5–15)
BILIRUBIN TOTAL: 0.9 mg/dL (ref 0.3–1.2)
BUN: 70 mg/dL — ABNORMAL HIGH (ref 6–20)
CALCIUM: 7.2 mg/dL — AB (ref 8.9–10.3)
CO2: 27 mmol/L (ref 22–32)
Chloride: 104 mmol/L (ref 101–111)
Creatinine, Ser: 3.28 mg/dL — ABNORMAL HIGH (ref 0.61–1.24)
GFR calc Af Amer: 24 mL/min — ABNORMAL LOW (ref >60)
GFR, EST NON AFRICAN AMERICAN: 20 mL/min — AB (ref >60)
GLUCOSE: 117 mg/dL — AB (ref 65–99)
Potassium: 3.7 mmol/L (ref 3.5–5.1)
Sodium: 137 mmol/L (ref 135–145)
TOTAL PROTEIN: 4 g/dL — AB (ref 6.5–8.1)

## 2015-09-03 LAB — CULTURE, RESPIRATORY W GRAM STAIN

## 2015-09-03 LAB — TROPONIN I: TROPONIN I: 0.23 ng/mL — AB (ref <0.031)

## 2015-09-03 LAB — CK: CK TOTAL: 5397 U/L — AB (ref 49–397)

## 2015-09-03 LAB — CULTURE, RESPIRATORY

## 2015-09-03 LAB — MYOGLOBIN, URINE: Myoglobin, Ur: 66 ng/mL — ABNORMAL HIGH (ref 0–13)

## 2015-09-03 LAB — PHOSPHORUS: PHOSPHORUS: 2.5 mg/dL (ref 2.5–4.6)

## 2015-09-03 LAB — PROCALCITONIN: Procalcitonin: 32.9 ng/mL

## 2015-09-03 MED ORDER — STERILE WATER FOR INJECTION IJ SOLN
INTRAMUSCULAR | Status: AC
  Administered 2015-09-03: 10 mL
  Filled 2015-09-03: qty 10

## 2015-09-03 MED ORDER — VECURONIUM BROMIDE 10 MG IV SOLR
10.0000 mg | INTRAVENOUS | Status: DC | PRN
  Administered 2015-09-03 – 2015-09-06 (×13): 10 mg via INTRAVENOUS
  Filled 2015-09-03 (×16): qty 10

## 2015-09-03 MED ORDER — CEFAZOLIN SODIUM-DEXTROSE 2-4 GM/100ML-% IV SOLN
2.0000 g | Freq: Two times a day (BID) | INTRAVENOUS | Status: DC
  Filled 2015-09-03 (×2): qty 100

## 2015-09-03 MED ORDER — CEFAZOLIN SODIUM-DEXTROSE 2-4 GM/100ML-% IV SOLN
2.0000 g | Freq: Two times a day (BID) | INTRAVENOUS | Status: DC
  Administered 2015-09-03 – 2015-09-04 (×2): 2 g via INTRAVENOUS
  Filled 2015-09-03 (×4): qty 100

## 2015-09-03 MED ORDER — PUREFLOW DIALYSIS SOLUTION
INTRAVENOUS | Status: DC
  Administered 2015-09-03 – 2015-09-04 (×2): via INTRAVENOUS_CENTRAL
  Administered 2015-09-05: 3 via INTRAVENOUS_CENTRAL
  Administered 2015-09-05 – 2015-09-06 (×2): via INTRAVENOUS_CENTRAL

## 2015-09-03 MED ORDER — VECURONIUM BROMIDE 10 MG IV SOLR
INTRAVENOUS | Status: AC
  Administered 2015-09-03: 10 mg
  Filled 2015-09-03: qty 10

## 2015-09-03 MED ORDER — VECURONIUM BROMIDE 10 MG IV SOLR
INTRAVENOUS | Status: AC
  Administered 2015-09-03: 10 mg via INTRAVENOUS
  Filled 2015-09-03: qty 10

## 2015-09-03 MED ORDER — ALBUMIN HUMAN 25 % IV SOLN
12.5000 g | Freq: Three times a day (TID) | INTRAVENOUS | Status: DC
  Administered 2015-09-03 – 2015-09-06 (×10): 12.5 g via INTRAVENOUS
  Filled 2015-09-03 (×12): qty 50

## 2015-09-03 NOTE — Progress Notes (Signed)
Nutrition Follow-up   INTERVENTION:   EN: recommend continuing current TF regimen; continue to assess  NUTRITION DIAGNOSIS:   Inadequate oral intake related to acute illness as evidenced by NPO status. Being addressed via TF while on vent  GOAL:   Provide needs based on ASPEN/SCCM guidelines   MONITOR:   TF tolerance, Vent status, Labs, I & O's, Weight trends  REASON FOR ASSESSMENT:   Ventilator    ASSESSMENT:   Patient is currently intubated on ventilator support, sedated, levophed at 6 mcg/min. Remains on CRRT. MV: 14.5 L/min Temp (24hrs), Avg:98.9 F (37.2 C), Min:98.2 F (36.8 C), Max:100.4 F (38 C)  Diet Order:   NPO  EN: tolerating Vital 1.5 at 50 ml/hr, Prostat QID  Skin:  Reviewed, no issues  Last BM:  09/03/15    Recent Labs Lab 09/01/15 0435  09/02/15 0519  09/02/15 1932 09/03/15 0040 09/03/15 0443  NA 135  < > 137  < > 137 136 137  K 3.9  < > 3.6  < > 3.4* 4.0 3.7  CL 102  < > 104  < > 101 104 104  CO2 29  < > 26  < > 26 28 27   BUN 66*  < > 73*  < > 80* 75* 70*  CREATININE 3.29*  < > 3.44*  < > 3.59* 3.56* 3.28*  CALCIUM 7.1*  < > 7.3*  < > 7.6* 7.2* 7.2*  MG 2.3  --  2.2  --   --   --  2.1  PHOS 1.9*  --  3.0  --   --   --  2.5  GLUCOSE 145*  < > 144*  < > 156* 126* 117*  < > = values in this interval not displayed.  Meds: reviewed  Height:   Ht Readings from Last 1 Encounters:  08/28/15 6\' 1"  (1.854 m)    Weight:   Wt Readings from Last 1 Encounters:  09/03/15 255 lb 15.3 oz (116.1 kg)    Filed Weights   08/31/15 0445 09/02/15 0500 09/03/15 0439  Weight: 249 lb 9 oz (113.2 kg) 249 lb 9 oz (113.2 kg) 255 lb 15.3 oz (116.1 kg)    BMI:  Body mass index is 33.78 kg/(m^2).  Estimated Nutritional Needs:   Kcal:  2264 kcals (Ve: 14.5, Tmax: 37.2) using wt of 98.6 kg  Protein:  148-197 g (1.5-2.0 g/kg)   Fluid:  2L per day  EDUCATION NEEDS:   Education needs no appropriate at this time  Romelle Starcherate Harmon Bommarito MS, RD, LDN [phone]  [phone] Pager  [phone] Weekend/On-Call Pager

## 2015-09-03 NOTE — Consult Note (Signed)
Pharmacy Antibiotic Note  John Hoover is a 50 y.o. male admitted on 08/27/2015 with enterococcal respiratory infection.  Pharmacy has been consulted for cefazolin dosing in this patient with CRRT with pan-sensitive enterococcus in sputum.  Plan: Will initiate cefazolin 2g IV q12hrs.    Height:  (185.4 cm) Weight: 255 lb 15.3 oz (116.1 kg) IBW/kg (Calculated) : 79.9  Temp (24hrs), Avg:99.1 F (37.3 C), Min:98.2 F (36.8 C), Max:100.4 F (38 C)   Recent Labs Lab 08/28/15 1724 08/28/15 2133  08/30/15 1543  08/31/15 0447  09/01/15 0435  09/02/15 0519 09/02/15 1207 09/02/15 1656 09/02/15 1932 09/03/15 0040 09/03/15 0443  WBC  --   --   < > 23.4*  --  18.9*  --  20.1*  --  19.5*  --   --   --   --  19.0*  CREATININE 3.01* 3.00*  < > 2.52*  < > 2.24*  < > 3.29*  < > 3.44* 3.82* 3.62* 3.59* 3.56* 3.28*  LATICACIDVEN 10.1* 5.4*  --  5.3*  --   --   --   --   --   --   --   --   --   --   --   VANCOTROUGH  --   --   --  8*  --   --   --   --   --   --   --   --   --   --   --   < > = values in this interval not displayed.  Estimated Creatinine Clearance: 35.6 mL/min (by C-G formula based on Cr of 3.28).    No Known Allergies  Antimicrobials this admission: Cefazolin 4/3>> Cefepime 3/28 >> 4/3 Vancomycin 3/29 >> 4/3 Oseltamivir 3/29>>  3/30 Ceftriaxone x1 in ED  Microbiology results: Recent Results (from the past 240 hour(s))  Rapid Influenza A&B Antigens ([facility] only)     Status: None   Collection Time: 08/27/15 12:22 PM  Result Value Ref Range Status   Influenza A ([facility]) NEGATIVE NEGATIVE Final   Influenza B ([facility]) NEGATIVE NEGATIVE Final  Urine culture     Status: None   Collection Time: 08/27/15  7:40 PM  Result Value Ref Range Status   Specimen Description URINE, RANDOM  Final   Special Requests Normal  Final   Culture NO GROWTH 1 DAY  Final   Report Status 08/29/2015 FINAL  Final  MRSA PCR Screening     Status: None   Collection Time: 08/28/15  5:25 PM   Result Value Ref Range Status   MRSA by PCR NEGATIVE NEGATIVE Final    Comment:        The GeneXpert MRSA Assay (FDA approved for NASAL specimens only), is one component of a comprehensive MRSA colonization surveillance program. It is not intended to diagnose MRSA infection nor to guide or monitor treatment for MRSA infections.   Culture, blood (Routine X 2) w Reflex to ID Panel     Status: None   Collection Time: 08/28/15  5:51 PM  Result Value Ref Range Status   Specimen Description BLOOD RIGHT HAND  Final   Special Requests BAA,ANA,AER,5ML  Final   Culture NO GROWTH 5 DAYS  Final   Report Status 09/02/2015 FINAL  Final  Culture, blood (Routine X 2) w Reflex to ID Panel     Status: None   Collection Time: 08/28/15  5:51 PM  Result Value Ref Range Status   Specimen Description BLOOD RIGHT  ASSIST CONTROL  Final   Special Requests BAA,ANA,AER,5ML  Final   Culture NO GROWTH 5 DAYS  Final   Report Status 09/02/2015 FINAL  Final  Culture, respiratory (NON-Expectorated)     Status: None   Collection Time: 08/29/15  2:16 PM  Result Value Ref Range Status   Specimen Description TRACHEAL ASPIRATE  Final   Special Requests NONE  Final   Gram Stain FEW WBC SEEN FEW YEAST   Final   Culture Consistent with normal respiratory flora.  Final   Report Status 09/01/2015 FINAL  Final  Culture, respiratory (NON-Expectorated)     Status: None   Collection Time: 08/31/15  3:45 PM  Result Value Ref Range Status   Specimen Description TRACHEAL ASPIRATE  Final   Special Requests NONE  Final   Gram Stain   Final    FEW WBC SEEN MANY GRAM POSITIVE RODS MODERATE GRAM POSITIVE COCCI RARE YEAST    Culture HEAVY GROWTH ENTEROCOCCUS FAECALIS  Final   Report Status 09/03/2015 FINAL  Final   Organism ID, Bacteria ENTEROCOCCUS FAECALIS  Final      Susceptibility   Enterococcus faecalis - MIC*    AMPICILLIN <=2 SENSITIVE Sensitive     VANCOMYCIN 1 SENSITIVE Sensitive     GENTAMICIN SYNERGY  SENSITIVE Sensitive     LINEZOLID 2 SENSITIVE Sensitive     * HEAVY GROWTH ENTEROCOCCUS FAECALIS  Culture, respiratory (NON-Expectorated)     Status: None (Preliminary result)   Collection Time: 09/02/15  4:30 PM  Result Value Ref Range Status   Specimen Description TRACHEAL ASPIRATE  Final   Special Requests NONE  Final   Gram Stain   Final    MODERATE WBC SEEN FEW GRAM POSITIVE COCCI IN PAIRS    Culture HOLDING FOR POSSIBLE PATHOGEN  Final   Report Status PENDING  Incomplete  C difficile quick scan w PCR reflex     Status: None   Collection Time: 09/02/15  4:56 PM  Result Value Ref Range Status   C Diff antigen NEGATIVE NEGATIVE Final   C Diff toxin NEGATIVE NEGATIVE Final   C Diff interpretation Negative for C. difficile  Final   Thank you for allowing pharmacy to be a part of this patient's care.  Cy Blamerllison K Haset [place] 09/03/2015 12:43 PM

## 2015-09-03 NOTE — Progress Notes (Signed)
[facility]  ROUNDING NOTE   Subjective:   CRRT continued. Dialyzer changed earlier today UOP 50 cc overnight Norepinephrine gtt  Objective:  Vital signs in last 24 hours:  Temp:  [98.2 F (36.8 C)-100.4 F (38 C)] 99 F (37.2 C) (04/03 1000) Pulse Rate:  [99-122] 105 (04/03 1000) Resp:  [12-35] 12 (04/03 1000) SpO2:  [84 %-100 %] 100 % (04/03 1000) Arterial Line BP: (54-150)/(47-81) 109/62 mmHg (04/03 1000) FiO2 (%):  [60 %-100 %] 75 % (04/03 0300) Weight:  [116.1 kg (255 lb 15.3 oz)] 116.1 kg (255 lb 15.3 oz) (04/03 0439)  Weight change: 2.9 kg (6 lb 6.3 oz) Filed Weights   08/31/15 0445 09/02/15 0500 09/03/15 0439  Weight: 113.2 kg (249 lb 9 oz) 113.2 kg (249 lb 9 oz) 116.1 kg (255 lb 15.3 oz)    Intake/Output: I/O last 3 completed shifts: In: 2355.8 [I.V.:1665.8; Blood:390; IV XMIWOEHOZ:224] Out: 8250 [Urine:100; IBBCW:8889]   Intake/Output this shift:     Physical Exam: General: Critically ill   Head: +ETT +OGT  Eyes: Eyes closed, pupils small, round  Neck:  trachea midline, left subclavian triple lumen  Lungs:  Clear to auscultation, FiO2 75%, PEEP 15  Heart: regular  Abdomen:  Soft, nontender  Extremities: ++edema, Massive Scrotal edema  Neurologic: Sedated, intubated.   Skin: No lesions  Access: Left IJ TLC    Basic Metabolic Panel:  Recent Labs Lab 08/30/15 0433  08/31/15 0447  09/01/15 0435  09/02/15 0519 09/02/15 1207 09/02/15 1656 09/02/15 1932 09/03/15 0040 09/03/15 0443  NA 134*  < > 134*  < > 135  < > 137 134* 135 137 136 137  K 3.8  < > 3.7  < > 3.9  < > 3.6 3.6 3.2* 3.4* 4.0 3.7  CL 105  < > 103  < > 102  < > 104 102 104 101 104 104  CO2 21*  < > 23  < > 29  < > 26 25 24 26 28 27   GLUCOSE 174*  < > 152*  < > 145*  < > 144* 158* 177* 156* 126* 117*  BUN 45*  < > 51*  < > 66*  < > 73* 85* 84* 80* 75* 70*  CREATININE 2.21*  < > 2.24*  < > 3.29*  < > 3.44* 3.82* 3.62* 3.59* 3.56* 3.28*  CALCIUM 6.6*  < > 7.4*  < > 7.1*   < > 7.3* 7.2* 7.1* 7.6* 7.2* 7.2*  MG 1.9  --  2.1  --  2.3  --  2.2  --   --   --   --  2.1  PHOS 3.3  --  2.3*  --  1.9*  --  3.0  --   --   --   --  2.5  < > = values in this interval not displayed.  Liver Function Tests:  Recent Labs Lab 08/30/15 1543 08/31/15 0915 08/31/15 1735 09/01/15 0435 09/03/15 0443  AST 296* 495* 497* 442* 287*  ALT 87* 135* 147* 148* 193*  ALKPHOS 34* 37* 38 48 84  BILITOT 0.8 0.9 0.7 0.7 0.9  PROT 3.7* 4.4* 4.0* 4.1* 4.0*  ALBUMIN 2.0* 2.9* 2.3* 2.2* 1.6*    Recent Labs Lab 08/31/15 0915  LIPASE 11  AMYLASE 29   No results for input(s): AMMONIA in the last 168 hours.  CBC:  Recent Labs Lab 08/27/15 1221  08/30/15 1543 08/31/15 0447 09/01/15 0435 09/02/15 0519 09/03/15 0443  WBC  11.0*  < > 23.4* 18.9* 20.1* 19.5* 19.0*  NEUTROABS 8.1*  --  21.7* 16.6*  --   --   --   HGB 19.7*  < > 11.9* 11.0* 10.3* 9.5* 9.1*  HCT 59.7*  < > 36.0* 32.5* 30.9* 28.9* 28.0*  MCV 90.1  < > 89.3 89.0 90.7 91.9 93.0  PLT 125*  < > 28* 26* 56* 33* 66*  < > = values in this interval not displayed.  Cardiac Enzymes:  Recent Labs Lab 08/28/15 2133 08/29/15 0439  08/30/15 1543 08/31/15 0915 09/01/15 0435 09/02/15 0519 09/02/15 1419 09/03/15 0443  CKTOTAL  --  7210*  < > 14001* 22702* 62563* 17243*  --  5397*  CKMB  --   --   --  144.6*  --   --   --   --   --   TROPONINI 0.41* 0.36*  --  0.50*  --   --   --  0.32* 0.23*  < > = values in this interval not displayed.  BNP: Invalid input(s): POCBNP  CBG:  Recent Labs Lab 09/01/15 1930 09/02/15 0731 09/02/15 1121 09/02/15 2331 09/03/15 0722  GLUCAP 162* 129* 132* 128* 93    Microbiology: Results for orders placed or performed during the hospital encounter of 08/27/15  Rapid Influenza A&B Antigens ([facility] only)     Status: None   Collection Time: 08/27/15 12:22 PM  Result Value Ref Range Status   Influenza A ([place]) NEGATIVE NEGATIVE Final   Influenza B ([facility]) NEGATIVE NEGATIVE Final   Urine culture     Status: None   Collection Time: 08/27/15  7:40 PM  Result Value Ref Range Status   Specimen Description URINE, RANDOM  Final   Special Requests Normal  Final   Culture NO GROWTH 1 DAY  Final   Report Status 08/29/2015 FINAL  Final  MRSA PCR Screening     Status: None   Collection Time: 08/28/15  5:25 PM  Result Value Ref Range Status   MRSA by PCR NEGATIVE NEGATIVE Final    Comment:        The GeneXpert MRSA Assay (FDA approved for NASAL specimens only), is one component of a comprehensive MRSA colonization surveillance program. It is not intended to diagnose MRSA infection nor to guide or monitor treatment for MRSA infections.   Culture, blood (Routine X 2) w Reflex to ID Panel     Status: None   Collection Time: 08/28/15  5:51 PM  Result Value Ref Range Status   Specimen Description BLOOD RIGHT HAND  Final   Special Requests BAA,ANA,AER,5ML  Final   Culture NO GROWTH 5 DAYS  Final   Report Status 09/02/2015 FINAL  Final  Culture, blood (Routine X 2) w Reflex to ID Panel     Status: None   Collection Time: 08/28/15  5:51 PM  Result Value Ref Range Status   Specimen Description BLOOD RIGHT ASSIST CONTROL  Final   Special Requests BAA,ANA,AER,5ML  Final   Culture NO GROWTH 5 DAYS  Final   Report Status 09/02/2015 FINAL  Final  Culture, respiratory (NON-Expectorated)     Status: None   Collection Time: 08/29/15  2:16 PM  Result Value Ref Range Status   Specimen Description TRACHEAL ASPIRATE  Final   Special Requests NONE  Final   Gram Stain FEW WBC SEEN FEW YEAST   Final   Culture Consistent with normal respiratory flora.  Final   Report Status 09/01/2015 FINAL  Final  Culture,  respiratory (NON-Expectorated)     Status: None   Collection Time: 08/31/15  3:45 PM  Result Value Ref Range Status   Specimen Description TRACHEAL ASPIRATE  Final   Special Requests NONE  Final   Gram Stain   Final    FEW WBC SEEN MANY GRAM POSITIVE RODS MODERATE GRAM  POSITIVE COCCI RARE YEAST    Culture HEAVY GROWTH ENTEROCOCCUS FAECALIS  Final   Report Status 09/03/2015 FINAL  Final   Organism ID, Bacteria ENTEROCOCCUS FAECALIS  Final      Susceptibility   Enterococcus faecalis - MIC*    AMPICILLIN <=2 SENSITIVE Sensitive     VANCOMYCIN 1 SENSITIVE Sensitive     GENTAMICIN SYNERGY SENSITIVE Sensitive     LINEZOLID 2 SENSITIVE Sensitive     * HEAVY GROWTH ENTEROCOCCUS FAECALIS  Culture, respiratory (NON-Expectorated)     Status: None (Preliminary result)   Collection Time: 09/02/15  4:30 PM  Result Value Ref Range Status   Specimen Description TRACHEAL ASPIRATE  Final   Special Requests NONE  Final   Gram Stain   Final    MODERATE WBC SEEN FEW GRAM POSITIVE COCCI IN PAIRS    Culture HOLDING FOR POSSIBLE PATHOGEN  Final   Report Status PENDING  Incomplete  C difficile quick scan w PCR reflex     Status: None   Collection Time: 09/02/15  4:56 PM  Result Value Ref Range Status   C Diff antigen NEGATIVE NEGATIVE Final   C Diff toxin NEGATIVE NEGATIVE Final   C Diff interpretation Negative for C. difficile  Final    Coagulation Studies: No results for input(s): LABPROT, INR in the last 72 hours.  Urinalysis: No results for input(s): COLORURINE, LABSPEC, PHURINE, GLUCOSEU, HGBUR, BILIRUBINUR, KETONESUR, PROTEINUR, UROBILINOGEN, NITRITE, LEUKOCYTESUR in the last 72 hours.  Invalid input(s): APPERANCEUR    Imaging: Dg Abd 1 View  09/02/2015  CLINICAL DATA:  Acute onset of generalized abdominal distention. Nasogastric tube and femoral line placement. Initial encounter. EXAM: ABDOMEN - 1 VIEW COMPARISON:  Abdominal radiograph performed 08/30/2015 FINDINGS: The patient's enteric tube is noted ending overlying the body of the stomach, with the side port about the fundus of the stomach. A right femoral dual-lumen catheter is noted ending overlying the mid abdomen. A smaller right femoral catheter is also seen. A rectal probe is also noted overlying  the pelvis. The visualized bowel gas pattern is unremarkable. Scattered air and stool filled loops of colon are seen; no abnormal dilatation of small bowel loops is seen to suggest small bowel obstruction. No free intra-abdominal air is identified, though evaluation for free air is limited on supine views. The visualized osseous structures are within normal limits; the sacroiliac joints are unremarkable in appearance. IMPRESSION: 1. Enteric tube noted ending overlying the body of the stomach, with the side port about the fundus of the stomach. 2. Stop Right femoral catheters noted as described above. 3. Unremarkable bowel gas pattern; no free intra-abdominal air seen. Electronically Signed   By: Garald Balding M.D.   On: 09/02/2015 01:38   Dg Chest Port 1 View  09/03/2015  CLINICAL DATA:  Respiratory failure EXAM: PORTABLE CHEST 1 VIEW COMPARISON:  09/02/2015 FINDINGS: Support devices stable. Stable cardiac silhouette. Extensive airspace disease throughout the left lung stable. Multifocal less extensive airspace disease is present on the right. Opacity in the right lower lobe slightly worse. IMPRESSION: Extensive bilateral opacities very similar to prior study with minimal interval increase in severity on the right. Electronically Signed  By: Skipper Cliche M.D.   On: 09/03/2015 07:09   Dg Chest Port 1 View  09/02/2015  CLINICAL DATA:  Central line placed EXAM: PORTABLE CHEST 1 VIEW COMPARISON:  Chest x-ray from earlier same day and chest x-ray dated 08/31/2015. FINDINGS: Left IJ central line in place with tip adequately positioned at the level of the mid SVC. Endotracheal tube remains well positioned with tip approximately 3 cm above the carina. Enteric tube passes below the diaphragm. Increased opacity noted within the left perihilar lung suggesting pulmonary edema pattern. Stable opacities within the right lung are also likely pulmonary edema. Stable dense opacity at the left lung base is probably  atelectasis. Suspect associated small left pleural effusion. IMPRESSION: 1. Left IJ central line in place with tip adequately positioned at the level of the mid SVC. No pneumothorax or other procedural complicating feature. 2. Increased opacities within the left perihilar lung highly suggestive of pulmonary edema pattern, suggesting worsened fluid status. Stable opacities within the right perihilar lung are also most likely pulmonary edema. Dense opacity at the left lung base most likely atelectasis with small adjacent pleural effusion. Electronically Signed   By: Franki Cabot M.D.   On: 09/02/2015 15:07   Dg Chest Port 1 View  09/02/2015  CLINICAL DATA:  Respiratory failure. EXAM: PORTABLE CHEST 1 VIEW COMPARISON:  09/01/2015 and prior exams FINDINGS: Cardiomegaly, right upper lobe atelectasis/airspace disease, and bilateral lower lung consolidation/atelectasis/ effusions again noted. An endotracheal tube with tip 3 cm above the carina, left subclavian central venous catheter with tip overlying the upper SVC, and NG tube with tip overlying the stomach again noted. There is no evidence of pneumothorax. IMPRESSION: Unchanged appearance of the chest. Electronically Signed   By: Margarette Canada M.D.   On: 09/02/2015 07:48     Medications:   . feeding supplement (VITAL 1.5 CAL) 1,000 mL (09/02/15 1806)  . fentaNYL infusion INTRAVENOUS 50 mcg/hr (09/02/15 2347)  . midazolam (VERSED) infusion 4 mg/hr (09/03/15 0021)  . norepinephrine (LEVOPHED) Adult infusion 6 mcg/min (09/03/15 0600)  . pureflow     . sodium chloride   Intravenous Once  . sodium chloride   Intravenous Once  . albumin human  12.5 g Intravenous TID  . antiseptic oral rinse  7 mL Mouth Rinse QID  . ceFEPime (MAXIPIME) IV  2 g Intravenous Q12H  . chlorhexidine gluconate (SAGE KIT)  15 mL Mouth Rinse BID  . feeding supplement (PRO-STAT SUGAR FREE 64)  30 mL Oral QID  . free water  30 mL Per Tube 6 times per day  . pantoprazole sodium  40 mg  Per Tube Q1200  . vancomycin  750 mg Intravenous Q24H   acetaminophen **OR** acetaminophen, albuterol, bisacodyl, fentaNYL, heparin, midazolam, sodium chloride, sodium chloride flush  Assessment/ Plan:  Mr. John Hoover is a 50 y.o. white male with no specific past medical history, who was admitted to [facility] on 08/27/2015 + Ibuprofen use prior to admission (600 mg TID x 2 days)  1. Acute Renal Failure 2. Metabolic Acidosis 3. Rhabdomyolysis 4. Acute respiratory failure 5. Influenza A positive 6. Hypoalbuminemia 7. Anasarca  Plan Patient with anuric to oliguric urine renal failure, anasarca, requiring vasopressors  Continue CRRT: CVVHD 4K bath, BFR 250 DFR 2.0 litre. UF 100-150  - q 4 hour BMP.  - Added low dose iv albumin for oncotic support    LOS: 7 John Hoover 4/3/201711:02 AM

## 2015-09-03 NOTE — Progress Notes (Signed)
eLink Physician-Brief Progress Note Patient Name: John AoWilliam E Hoover DOB: 1966/01/24 MRN: [id number]   Date of Service  09/03/2015  HPI/Events of Note  Flu hypoxia worsened last few days D/w Dr Sung AmabileSimonds  eICU Interventions  Consider doppler lower ext AM team to decide on utility     Intervention Category Intermediate Interventions: Communication with other healthcare providers and/or family  Nelda BucksFEINSTEIN,DANIEL J. 09/03/2015, 6:50 AM

## 2015-09-03 NOTE — Progress Notes (Signed)
PULMONARY / CRITICAL CARE MEDICINE   Name: John Hoover MRN: [id number] DOB: Aug 03, 1965    ADMISSION DATE:  08/27/2015 CONSULTATION DATE:  08/28/15  REFERRING MD: Cherlynn Kaiser  PT PROFILE: Young previously healthy 28 M admitted to gen med floor with several days of malaise and profound myalgias. Influenza PCR positive. On admission was noted to be hemoconcentrated with AKI and elevated CPK. On 03/28 developed progressive AMS and found to have profound metabolic acidosis. Transferred to ICU and PCCM consulted for management of acidosis, rhabdomyolysis, MODS, shock. Intubated, CVL placed and HD cath placed on arrival to ICU. Empiric abx initiated  MAJOR EVENTS/TEST RESULTS: 03/27 Admitted as above 03/28 transferred to ICU, intubated, CVL placed, HD cath placed, vasopressors initiated, Renal consult, CRRT initiated, HCO3 gtt initiated, empiric abx initated 03/28 CT head: NAD 03/28 Echocardiogram: LVEF 60-65%, grade one diastolic dysfunction 03/28 CTAP: no acute findings 03/30 BLE venous US: no DVT 03/31 Acidosis much improved. CRRT stopped, HCO3 gtt stopped 03/31 Transition off midaz infusion to dexmedetomidine 04/01 CRRT resumed. Back on low dose fentanyl infusion. Failed SBT. Trial furosemide with minimal response 04/02 Increasing agitation. Dexmedetomidine transitioned to propofol. R femoral HD cath not functioning and replaced after platelet transfusion. CXR pattern c/w edema, severe LE and scrotal edema - volume removal via CRRT 04/02 Worsening gas exchange requiring increased vent support and heavier sedation. HD cath required replacement. Platelets transfused prior to placement. Wife apprised several times over course of day  INDWELLING DEVICES:: ETT 03/28 >>  L [place] CVL 03/28 >>  R femoral HD cath 03/28 >> 04/02 R femoral A-line 03/28 >>  R IJ HD cath 04/02 >>   MICRO DATA: Flu PCR 03/27 >> positive for A, neg for H1N1 MRSA PCR 03/28 >> NEG Urine 03/27 >> NEG Blood 03/28 >>  NEG Resp 03/29 >> NOF HIV 03/31 >> NEG Resp 03/31 >> "holding for possible pathogen" >> C diff 04/02 >> NEG Resp 04/02 >>    ANTIMICROBIALS:  Oseltamivir 03/27 >> 04/01 Vanc 03/28 >> 03/31 Cefepime 03/28 >>    SUBJECTIVE:  RASS -3 to +1. Intermittently F/C Remains intubated,on sedation, increased WOB on vent fio2 at 75% PEEP at 14, CRRT clotting off Remains on vasopressors  VITAL SIGNS: BP 104/73 mmHg  Pulse 106  Temp(Src) 98.8 F (37.1 C) (Core (Comment))  Resp 18  Ht  (1.854 m)  Wt 255 lb 15.3 oz (116.1 kg)  BMI 33.78 kg/m2  SpO2 100%  HEMODYNAMICS: CVP:  [9 mmHg-20 mmHg] 15 mmHg  VENTILATOR SETTINGS: Vent Mode:  [-] PRVC FiO2 (%):  [60 %-100 %] 75 % Set Rate:  [25 bmp] 25 bmp Vt Set:  [600 mL] 600 mL PEEP:  [10 cmH20-14 cmH20] 14 cmH20  INTAKE / OUTPUT: I/O last 3 completed shifts: In: 2355.8 [I.V.:1665.8; Blood:390; IV Piggyback:300] Out: 1538 [Urine:100; Other:1438]  PHYSICAL EXAMINATION: General: RASS -2, WDWN Neuro: PERRL, EOMI, MAEs, DTRs symmetric HEENT: NCAT, WNL Cardiovascular: Regular, no murmurs  Lungs: diffuse rhonchi, dependent crackles Abdomen: soft, NT, ND, diminished BS Ext: warm, no cyanosis, increasing LE edema GU: severe scrotal and penile edema  LABS:  BMET  Recent Labs Lab 09/02/15 1932 09/03/15 0040 09/03/15 0443  NA 137 136 137  K 3.4* 4.0 3.7  CL 101 104 104  CO2 BUN 80* 75* 70*  CREATININE 3.59* 3.56* 3.28*  GLUCOSE 156* 126* 117*    Electrolytes  Recent Labs Lab 09/01/15 0435  09/02/15 0519  09/02/15 1932 09/03/15 0040 09/03/15 0443  CALCIUM 7.1*  < > 7.3*  < > 7.6* 7.2* 7.2*  MG 2.3  --  2.2  --   --   --  2.1  PHOS 1.9*  --  3.0  --   --   --  2.5  < > = values in this interval not displayed.  CBC  Recent Labs Lab 09/01/15 0435 09/02/15 0519 09/03/15 0443  WBC 20.1* 19.5* 19.0*  HGB 10.3* 9.5* 9.1*  HCT 30.9* 28.9* 28.0*  PLT 56* 33* 66*    Coag's  Recent Labs Lab  08/28/15 1724 08/30/15 1543 08/31/15 0447  APTT 81*  --  34  INR 1.84 1.21  --     Sepsis Markers  Recent Labs Lab 08/28/15 1724 08/28/15 2133 08/30/15 1543 09/01/15 1024 09/02/15 0519 09/03/15 0443  LATICACIDVEN 10.1* 5.4* 5.3*  --   --   --   PROCALCITON  --   --   --  2.28 2.12 32.90    ABG  Recent Labs Lab 09/01/15 0950 09/02/15 0742 09/02/15 2200  PHART 7.54* 7.45 7.36  PCO2ART 33 40 48  PO2ART 75* 54* 51*    Liver Enzymes  Recent Labs Lab 08/31/15 1735 09/01/15 0435 09/03/15 0443  AST 497* 442* 287*  ALT 147* 148* 193*  ALKPHOS 38 48 84  BILITOT 0.7 0.7 0.9  ALBUMIN 2.3* 2.2* 1.6*    Cardiac Enzymes  Recent Labs Lab 08/30/15 1543 09/02/15 1419 09/03/15 0443  TROPONINI 0.50* 0.32* 0.23*    Glucose  Recent Labs Lab 09/01/15 1622 09/01/15 1930 09/02/15 0731 09/02/15 1121 09/02/15 2331 09/03/15 0722  GLUCAP 83 162* 129* 132* 128* 93    CXR: increasing edema pattern   ASSESSMENT / PLAN:  PULMONARY A: Acute hypoxic resp failure Pulmonary edema Suspect VAP P:   Cont vent support - settings reviewed and/or adjusted Cont vent bundle Increase volume removal  -will need aggressive vent support   CARDIOVASCULAR A:  Shock, recurrent P:  MAP goal > 65 mmHg Cont norepinephrine infusion Cont hydrocortisone - wean dose as permitted by BP  RENAL A:   AKI - likely ATN + NSAIDS Severe lactic acidosis, resolved Severe hypervolemia P:   Monitor BMET intermittently Monitor I/Os Correct electrolytes as indicated CRRT per Renal service (UF -100 cc/hr)  GASTROINTESTINAL A:   Heme + stools P:   SUP: enteral PPI Cont TFs  HEMATOLOGIC A:   Polycythemia, resolved Mild anemia, acute blood loss - hemodynamically stable Severe thrombocytopenia  P:  DVT px: SCDs Monitor CBC intermittently Transfuse per usual guidelines Platelet transfusion 04/02 prior to HD cath placement  INFECTIOUS A:   Severe sepsis Vent assoc  PNA P:   Monitor temp, WBC count Micro and abx as above  ENDOCRINE A:   Mild stress induced hyperglycemia Risk of hypoglycemia in setting of AKI P:   DC SSI CBG q 8 hrs Consider resuming SSI for glu > 180  NEUROLOGIC A:   Acute ICU acquired encephalopathy ICU/vent associated discomfort P:   RASS goal: -2, -3 Transition dex to midaz Cont fentanyl infusion Cont PRN midazolam Cont PRN fentanyl  Rhabdomyolysis - Unclear etiology. CK persistently elevated Cont to monitor and continue supportive care  I have personally obtained a history, examined the patient, evaluated Pertinent laboratory and RadioGraphic/imaging results, and  formulated the assessment and plan   The Patient requires high complexity decision making for assessment and support, frequent evaluation and titration of therapies, application of advanced monitoring technologies and extensive interpretation of multiple databases.  Critical Care Time devoted to patient care services described in this note is 45 minutes.   Overall, patient is critically ill, prognosis is guarded.  Patient with Multiorgan failure and at high risk for cardiac arrest and death.    Lucie Leather, M.D.  [facility]  Medical Director [facility] Medical Director [facility] Cardio-Pulmonary Department

## 2015-09-04 LAB — BASIC METABOLIC PANEL
ANION GAP: 4 — AB (ref 5–15)
ANION GAP: 4 — AB (ref 5–15)
ANION GAP: 5 (ref 5–15)
ANION GAP: 4 — AB (ref 5–15)
ANION GAP: 4 — AB (ref 5–15)
Anion gap: 5 (ref 5–15)
BUN: 67 mg/dL — ABNORMAL HIGH (ref 6–20)
BUN: 68 mg/dL — ABNORMAL HIGH (ref 6–20)
BUN: 65 mg/dL — ABNORMAL HIGH (ref 6–20)
BUN: 64 mg/dL — AB (ref 6–20)
BUN: 67 mg/dL — AB (ref 6–20)
BUN: 71 mg/dL — ABNORMAL HIGH (ref 6–20)
CALCIUM: 7.5 mg/dL — AB (ref 8.9–10.3)
CALCIUM: 7.6 mg/dL — AB (ref 8.9–10.3)
CALCIUM: 7.7 mg/dL — AB (ref 8.9–10.3)
CALCIUM: 7.8 mg/dL — AB (ref 8.9–10.3)
CHLORIDE: 107 mmol/L (ref 101–111)
CHLORIDE: 107 mmol/L (ref 101–111)
CHLORIDE: 106 mmol/L (ref 101–111)
CO2: 26 mmol/L (ref 22–32)
CO2: 26 mmol/L (ref 22–32)
CO2: 26 mmol/L (ref 22–32)
CO2: 27 mmol/L (ref 22–32)
CO2: 27 mmol/L (ref 22–32)
CO2: 27 mmol/L (ref 22–32)
CREATININE: 3.19 mg/dL — AB (ref 0.61–1.24)
Calcium: 7.7 mg/dL — ABNORMAL LOW (ref 8.9–10.3)
Calcium: 7.7 mg/dL — ABNORMAL LOW (ref 8.9–10.3)
Chloride: 106 mmol/L (ref 101–111)
Chloride: 107 mmol/L (ref 101–111)
Chloride: 106 mmol/L (ref 101–111)
Creatinine, Ser: 3.31 mg/dL — ABNORMAL HIGH (ref 0.61–1.24)
Creatinine, Ser: 3.37 mg/dL — ABNORMAL HIGH (ref 0.61–1.24)
Creatinine, Ser: 3.22 mg/dL — ABNORMAL HIGH (ref 0.61–1.24)
Creatinine, Ser: 3.04 mg/dL — ABNORMAL HIGH (ref 0.61–1.24)
Creatinine, Ser: 3.47 mg/dL — ABNORMAL HIGH (ref 0.61–1.24)
GFR calc Af Amer: 23 mL/min — ABNORMAL LOW (ref >60)
GFR calc Af Amer: 24 mL/min — ABNORMAL LOW (ref >60)
GFR calc Af Amer: 26 mL/min — ABNORMAL LOW (ref >60)
GFR calc non Af Amer: 21 mL/min — ABNORMAL LOW (ref >60)
GFR, EST AFRICAN AMERICAN: 22 mL/min — AB (ref >60)
GFR, EST AFRICAN AMERICAN: 23 mL/min — AB (ref >60)
GFR, EST AFRICAN AMERICAN: 24 mL/min — AB (ref >60)
GFR, EST NON AFRICAN AMERICAN: 20 mL/min — AB (ref >60)
GFR, EST NON AFRICAN AMERICAN: 20 mL/min — AB (ref >60)
GFR, EST NON AFRICAN AMERICAN: 21 mL/min — AB (ref >60)
GFR, EST NON AFRICAN AMERICAN: 22 mL/min — AB (ref >60)
GFR, EST NON AFRICAN AMERICAN: 19 mL/min — AB (ref >60)
Glucose, Bld: 112 mg/dL — ABNORMAL HIGH (ref 65–99)
Glucose, Bld: 113 mg/dL — ABNORMAL HIGH (ref 65–99)
Glucose, Bld: 122 mg/dL — ABNORMAL HIGH (ref 65–99)
Glucose, Bld: 130 mg/dL — ABNORMAL HIGH (ref 65–99)
Glucose, Bld: 188 mg/dL — ABNORMAL HIGH (ref 65–99)
Glucose, Bld: 189 mg/dL — ABNORMAL HIGH (ref 65–99)
POTASSIUM: 3.7 mmol/L (ref 3.5–5.1)
POTASSIUM: 3.9 mmol/L (ref 3.5–5.1)
POTASSIUM: 3.8 mmol/L (ref 3.5–5.1)
POTASSIUM: 4.5 mmol/L (ref 3.5–5.1)
POTASSIUM: 4.8 mmol/L (ref 3.5–5.1)
Potassium: 3.9 mmol/L (ref 3.5–5.1)
SODIUM: 137 mmol/L (ref 135–145)
SODIUM: 137 mmol/L (ref 135–145)
SODIUM: 137 mmol/L (ref 135–145)
SODIUM: 138 mmol/L (ref 135–145)
SODIUM: 138 mmol/L (ref 135–145)
Sodium: 137 mmol/L (ref 135–145)

## 2015-09-04 LAB — BLOOD GAS, ARTERIAL
ACID-BASE EXCESS: 4.8 mmol/L — AB (ref 0.0–3.0)
Allens test (pass/fail): POSITIVE — AB
Bicarbonate: 29.8 mEq/L — ABNORMAL HIGH (ref 21.0–28.0)
FIO2: 0.35
LHR: 25 {breaths}/min
O2 Saturation: 96.7 %
PCO2 ART: 46 mmHg (ref 32.0–48.0)
PEEP: 10 cmH2O
PH ART: 7.42 (ref 7.350–7.450)
Patient temperature: 37
pO2, Arterial: 86 mmHg (ref 83.0–108.0)

## 2015-09-04 LAB — MAGNESIUM: MAGNESIUM: 2.3 mg/dL (ref 1.7–2.4)

## 2015-09-04 LAB — GLUCOSE, CAPILLARY
GLUCOSE-CAPILLARY: 104 mg/dL — AB (ref 65–99)
GLUCOSE-CAPILLARY: 128 mg/dL — AB (ref 65–99)
Glucose-Capillary: 122 mg/dL — ABNORMAL HIGH (ref 65–99)
Glucose-Capillary: 171 mg/dL — ABNORMAL HIGH (ref 65–99)

## 2015-09-04 LAB — PNEUMOCYSTIS JIROVECI QPCR BAL: P. JIROVECI QPCR BAL: NOT DETECTED {copies}/mL

## 2015-09-04 LAB — PHOSPHORUS: Phosphorus: 2.4 mg/dL — ABNORMAL LOW (ref 2.5–4.6)

## 2015-09-04 MED ORDER — SODIUM CHLORIDE 0.9 % IV SOLN
2.0000 g | Freq: Three times a day (TID) | INTRAVENOUS | Status: DC
  Administered 2015-09-04 – 2015-09-05 (×3): 2 g via INTRAVENOUS
  Filled 2015-09-04 (×5): qty 2000

## 2015-09-04 MED ORDER — STERILE WATER FOR INJECTION IJ SOLN
INTRAMUSCULAR | Status: AC
  Administered 2015-09-04: 10 mL
  Filled 2015-09-04: qty 10

## 2015-09-04 MED ORDER — STERILE WATER FOR INJECTION IJ SOLN
INTRAMUSCULAR | Status: AC
  Administered 2015-09-04: 20:00:00
  Filled 2015-09-04: qty 10

## 2015-09-04 MED ORDER — DIAZEPAM 5 MG/ML IJ SOLN
5.0000 mg | Freq: Four times a day (QID) | INTRAMUSCULAR | Status: DC
  Administered 2015-09-04 – 2015-09-10 (×22): 5 mg via INTRAVENOUS
  Filled 2015-09-04 (×23): qty 2

## 2015-09-04 MED ORDER — SENNOSIDES-DOCUSATE SODIUM 8.6-50 MG PO TABS
1.0000 | ORAL_TABLET | Freq: Two times a day (BID) | ORAL | Status: DC
  Administered 2015-09-04 – 2015-09-05 (×2): 1 via ORAL
  Filled 2015-09-04 (×3): qty 1

## 2015-09-04 MED ORDER — METHYLPREDNISOLONE SODIUM SUCC 40 MG IJ SOLR
40.0000 mg | Freq: Two times a day (BID) | INTRAMUSCULAR | Status: DC
  Administered 2015-09-04 – 2015-09-10 (×12): 40 mg via INTRAVENOUS
  Filled 2015-09-04 (×12): qty 1

## 2015-09-04 MED ORDER — ALTEPLASE 2 MG IJ SOLR
2.0000 mg | Freq: Once | INTRAMUSCULAR | Status: AC | PRN
  Administered 2015-09-04: 2 mg
  Filled 2015-09-04: qty 2

## 2015-09-04 NOTE — Progress Notes (Signed)
MEDICATION RELATED CONSULT NOTE - FOLLOW UP   Pharmacy Consult for CRRT Dosing/Constipation Preventino   No Known Allergies  Patient Measurements: Height: _0  (185.4 cm) Weight: 254 lb 10.1 oz (115.5 kg) IBW/kg (Calculated) : 79.9  Vital Signs: Temp: 98.8 F (37.1 C) (04/04 1500) Pulse Rate: 108 (04/04 1500) Intake/Output from previous day: 04/03 0701 - 04/04 0700 In: 1871.6 [I.V.:561.6; NG/GT:960; IV Piggyback:350] Out: 3074 [Urine:50] Intake/Output from this shift: Total I/O In: 770 [I.V.:220; NG/GT:400; IV Piggyback:150] Out: 1029 [Other:1029]  Labs:  Recent Labs  09/02/15 0519  09/03/15 0443  09/04/15 0421 09/04/15 0836 09/04/15 1158  WBC 19.5*  --  19.0*  --   --   --   --   HGB 9.5*  --  9.1*  --   --   --   --   HCT 28.9*  --  28.0*  --   --   --   --   PLT 33*  --  66*  --   --   --   --   CREATININE 3.44*  < > 3.28*  < > 3.31* 3.37* 3.22*  MG 2.2  --  2.1  --  2.3  --   --   PHOS 3.0  --  2.5  --  2.4*  --   --   ALBUMIN  --   --  1.6*  --   --   --   --   PROT  --   --  4.0*  --   --   --   --   AST  --   --  287*  --   --   --   --   ALT  --   --  193*  --   --   --   --   ALKPHOS  --   --  84  --   --   --   --   BILITOT  --   --  0.9  --   --   --   --   < > = values in this interval not displayed. Estimated Creatinine Clearance: 36.1 mL/min (by C-G formula based on Cr of 3.22).   Microbiology: Recent Results (from the past 720 hour(s))  Rapid Influenza A&B Antigens ([facility] only)     Status: None   Collection Time: 08/27/15 12:22 PM  Result Value Ref Range Status   Influenza A ([facility]) NEGATIVE NEGATIVE Final   Influenza B ([facility]) NEGATIVE NEGATIVE Final  Urine culture     Status: None   Collection Time: 08/27/15  7:40 PM  Result Value Ref Range Status   Specimen Description URINE, RANDOM  Final   Special Requests Normal  Final   Culture NO GROWTH 1 DAY  Final   Report Status 08/29/2015 FINAL  Final  MRSA PCR Screening     Status: None   Collection Time: 08/28/15  5:25 PM  Result Value Ref Range Status   MRSA by PCR NEGATIVE NEGATIVE Final    Comment:        The GeneXpert MRSA Assay (FDA approved for NASAL specimens only), is one component of a comprehensive MRSA colonization surveillance program. It is not intended to diagnose MRSA infection nor to guide or monitor treatment for MRSA infections.   Culture, blood (Routine X 2) w Reflex to ID Panel     Status: None   Collection Time: 08/28/15  5:51 PM  Result Value Ref Range Status   Specimen Description  BLOOD RIGHT HAND  Final   Special Requests BAA,ANA,AER,5ML  Final   Culture NO GROWTH 5 DAYS  Final   Report Status 09/02/2015 FINAL  Final  Culture, blood (Routine X 2) w Reflex to ID Panel     Status: None   Collection Time: 08/28/15  5:51 PM  Result Value Ref Range Status   Specimen Description BLOOD RIGHT ASSIST CONTROL  Final   Special Requests BAA,ANA,AER,5ML  Final   Culture NO GROWTH 5 DAYS  Final   Report Status 09/02/2015 FINAL  Final  Culture, respiratory (NON-Expectorated)     Status: None   Collection Time: 08/29/15  2:16 PM  Result Value Ref Range Status   Specimen Description TRACHEAL ASPIRATE  Final   Special Requests NONE  Final   Gram Stain FEW WBC SEEN FEW YEAST   Final   Culture Consistent with normal respiratory flora.  Final   Report Status 09/01/2015 FINAL  Final  Culture, respiratory (NON-Expectorated)     Status: None   Collection Time: 08/31/15  3:45 PM  Result Value Ref Range Status   Specimen Description TRACHEAL ASPIRATE  Final   Special Requests NONE  Final   Gram Stain   Final    FEW WBC SEEN MANY GRAM POSITIVE RODS MODERATE GRAM POSITIVE COCCI RARE YEAST    Culture HEAVY GROWTH ENTEROCOCCUS FAECALIS  Final   Report Status 09/03/2015 FINAL  Final   Organism ID, Bacteria ENTEROCOCCUS FAECALIS  Final      Susceptibility   Enterococcus faecalis - MIC*    AMPICILLIN <=2 SENSITIVE Sensitive     VANCOMYCIN 1 SENSITIVE  Sensitive     GENTAMICIN SYNERGY SENSITIVE Sensitive     LINEZOLID 2 SENSITIVE Sensitive     * HEAVY GROWTH ENTEROCOCCUS FAECALIS  Culture, blood (Routine X 2) w Reflex to ID Panel     Status: None (Preliminary result)   Collection Time: 09/02/15  3:18 PM  Result Value Ref Range Status   Specimen Description BLOOD LINE  Final   Special Requests   Final    BOTTLES DRAWN AEROBIC AND ANAEROBIC  AER 4CC ANA 1CC   Culture NO GROWTH 2 DAYS  Final   Report Status PENDING  Incomplete  Culture, blood (Routine X 2) w Reflex to ID Panel     Status: None (Preliminary result)   Collection Time: 09/02/15  3:20 PM  Result Value Ref Range Status   Specimen Description BLOOD RIGHT ARM  Final   Special Requests BOTTLES DRAWN AEROBIC AND ANAEROBIC  8CC  Final   Culture NO GROWTH 2 DAYS  Final   Report Status PENDING  Incomplete  Culture, respiratory (NON-Expectorated)     Status: None (Preliminary result)   Collection Time: 09/02/15  4:30 PM  Result Value Ref Range Status   Specimen Description TRACHEAL ASPIRATE  Final   Special Requests NONE  Final   Gram Stain   Final    MODERATE WBC SEEN FEW GRAM POSITIVE COCCI IN PAIRS    Culture   Final    LIGHT GROWTH GRAM NEGATIVE RODS IDENTIFICATION TO FOLLOW ONCE BETTER GROWTH    Report Status PENDING  Incomplete  C difficile quick scan w PCR reflex     Status: None   Collection Time: 09/02/15  4:56 PM  Result Value Ref Range Status   C Diff antigen NEGATIVE NEGATIVE Final   C Diff toxin NEGATIVE NEGATIVE Final   C Diff interpretation Negative for C. difficile  Final  Medical History: Past Medical History  Diagnosis Date  . Meniere's disease     Medications:  Scheduled:  . sodium chloride   Intravenous Once  . sodium chloride   Intravenous Once  . albumin human  12.5 g Intravenous TID  . ampicillin (OMNIPEN) IV  2 g Intravenous 3 times per day  . antiseptic oral rinse  7 mL Mouth Rinse QID  . chlorhexidine gluconate (SAGE KIT)  15 mL  Mouth Rinse BID  . diazepam  5 mg Intravenous 4 times per day  . feeding supplement (PRO-STAT SUGAR FREE 64)  30 mL Oral QID  . free water  30 mL Per Tube 6 times per day  . methylPREDNISolone (SOLU-MEDROL) injection  40 mg Intravenous Q12H  . pantoprazole sodium  40 mg Per Tube Q1200  . senna-docusate  1 tablet Oral BID   Infusions:  . feeding supplement (VITAL 1.5 CAL) 1,000 mL (09/04/15 1350)  . fentaNYL infusion INTRAVENOUS 300 mcg/hr (09/04/15 1506)  . midazolam (VERSED) infusion 4 mg/hr (09/04/15 0326)  . norepinephrine (LEVOPHED) Adult infusion Stopped (09/03/15 1614)  . pureflow 2,000 mL/hr at 09/04/15 1513    Assessment: Pharmacy consulted to assist in adjusting medications for CRRT and for constipation prevention in this 50 y/o M with rhabdomyolysis.   Plan:  1. No further adjustments are necessary at this time.  2. Will begin senna-docusate 1 tab po PT bid.   Pharmacy will continue to monitor and adjust per consult.   Ulice Dash D 09/04/2015,4:02 PM

## 2015-09-04 NOTE — Progress Notes (Signed)
PULMONARY / CRITICAL CARE MEDICINE   Name: John Hoover MRN: [id number] DOB: Nov 23, 1965    ADMISSION DATE:  08/27/2015 CONSULTATION DATE:  08/28/15    PT PROFILE: John Hoover previously healthy 50 M admitted to gen med floor with several days of malaise and profound myalgias. Influenza PCR positive. On admission was noted to be hemoconcentrated with AKI and elevated CPK. On 03/28 developed progressive AMS and found to have profound metabolic acidosis. Transferred to ICU and PCCM consulted for management of acidosis, rhabdomyolysis, MODS, shock. Intubated, CVL placed and HD cath placed on arrival to ICU. Empiric abx initiated  MAJOR EVENTS/TEST RESULTS: 03/27 Admitted as above 03/28 transferred to ICU, intubated, CVL placed, HD cath placed, vasopressors initiated, Renal consult, CRRT initiated, HCO3 gtt initiated, empiric abx initated 03/28 CT head: NAD 03/28 Echocardiogram: LVEF 60-65%, grade one diastolic dysfunction 03/28 CTAP: no acute findings 03/30 BLE venous US: no DVT 03/31 Acidosis much improved. CRRT stopped, HCO3 gtt stopped 03/31 Transition off midaz infusion to dexmedetomidine 04/01 CRRT resumed. Back on low dose fentanyl infusion. Failed SBT. Trial furosemide with minimal response 04/02 Increasing agitation. Dexmedetomidine transitioned to propofol. R femoral HD cath not functioning and replaced after platelet transfusion. CXR pattern c/w edema, severe LE and scrotal edema - volume removal via CRRT 04/02 Worsening gas exchange requiring increased vent support and heavier sedation. HD cath required replacement. Platelets transfused prior to placement. Wife apprised several times over course of day 4/3 remains on CRRT very low UO  INDWELLING DEVICES:: ETT 03/28 >>  L [place] CVL 03/28 >>  R femoral HD cath 03/28 >> 04/02 R femoral A-line 03/28 >>  R IJ HD cath 04/02 >>   MICRO DATA: Flu PCR 03/27 >> positive for A, neg for H1N1 MRSA PCR 03/28 >> NEG Urine 03/27 >> NEG Blood  03/28 >> NEG Resp 03/29 >> NOF HIV 03/31 >> NEG Resp 03/31 >>enterococcus faecalis C diff 04/02 >> NEG Resp 04/02 >>    ANTIMICROBIALS:  Oseltamivir 03/27 >> 04/01 Vanc 03/28 >> 03/31 Cefepime 03/28 >>    SUBJECTIVE:  RASS -3 to +1. Intermittently F/C Remains intubated,on sedation,  fio2 at 35% PEEP at 10, on CRRT, off vasopressors this AM   VITAL SIGNS: BP 104/73 mmHg  Pulse 113  Temp(Src) 99.1 F (37.3 C) (Core (Comment))  Resp 23  Ht  (1.854 m)  Wt 254 lb 10.1 oz (115.5 kg)  BMI 33.60 kg/m2  SpO2 100%  HEMODYNAMICS: CVP:  [13 mmHg-21 mmHg] 13 mmHg  VENTILATOR SETTINGS: Vent Mode:  [-] PRVC FiO2 (%):  [35 %-75 %] 35 % Set Rate:  [25 bmp] 25 bmp Vt Set:  [600 mL] 600 mL PEEP:  [10 cmH20-14 cmH20] 10 cmH20  INTAKE / OUTPUT: I/O last 3 completed shifts: In: 2713.9 [I.V.:1203.9; NG/GT:960; IV Piggyback:550] Out: 3885 [Urine:50; Other:3835]  PHYSICAL EXAMINATION: General: RASS -2, WDWN Neuro: PERRL, EOMI, MAEs, DTRs symmetric HEENT: NCAT, WNL Cardiovascular: Regular, no murmurs  Lungs: diffuse rhonchi, dependent crackles Abdomen: soft, NT, ND, diminished BS Ext: warm, + cyanosis, increasing LE edema GU: severe scrotal and penile edema  LABS:  BMET  Recent Labs Lab 09/03/15 1951 09/04/15 0012 09/04/15 0421  NA 136 138 137  K 4.0 3.9 3.7  CL 105 106 106  CO2 BUN 66* 67* 67*  CREATININE 3.11* 3.19* 3.31*  GLUCOSE 133* 130* 112*    Electrolytes  Recent Labs Lab 09/02/15 0519  09/03/15 0443 09/03/15 1951 09/04/15 0012 09/04/15 0421  CALCIUM  7.3*  < > 7.2* 7.5* 7.6* 7.7*  MG 2.2  --  2.1  --   --  2.3  PHOS 3.0  --  2.5  --   --  2.4*  < > = values in this interval not displayed.  CBC  Recent Labs Lab 09/01/15 0435 09/02/15 0519 09/03/15 0443  WBC 20.1* 19.5* 19.0*  HGB 10.3* 9.5* 9.1*  HCT 30.9* 28.9* 28.0*  PLT 56* 33* 66*    Coag's  Recent Labs Lab 08/28/15 1724 08/30/15 1543 08/31/15 0447  APTT 81*   --  34  INR 1.84 1.21  --     Sepsis Markers  Recent Labs Lab 08/28/15 1724 08/28/15 2133 08/30/15 1543 09/01/15 1024 09/02/15 0519 09/03/15 0443  LATICACIDVEN 10.1* 5.4* 5.3*  --   --   --   PROCALCITON  --   --   --  2.28 2.12 32.90    ABG  Recent Labs Lab 09/01/15 0950 09/02/15 0742 09/02/15 2200  PHART 7.54* 7.45 7.36  PCO2ART 33 40 48  PO2ART 75* 54* 51*    Liver Enzymes  Recent Labs Lab 08/31/15 1735 09/01/15 0435 09/03/15 0443  AST 497* 442* 287*  ALT 147* 148* 193*  ALKPHOS 38 48 84  BILITOT 0.7 0.7 0.9  ALBUMIN 2.3* 2.2* 1.6*    Cardiac Enzymes  Recent Labs Lab 08/30/15 1543 09/02/15 1419 09/03/15 0443  TROPONINI 0.50* 0.32* 0.23*    Glucose  Recent Labs Lab 09/02/15 1121 09/02/15 2331 09/03/15 0722 09/03/15 1620 09/04/15 0012 09/04/15 0014  GLUCAP 132* 128* 93 107* 128* 122*    CXR: increasing edema pattern   ASSESSMENT / PLAN: 50 yo white male with acute hypoxic resp failrue from acute INF A pneumonia with superimposed bacterial pneumonia complicated by acute rhabdo with acute and severe acidosis with ARF  PULMONARY A: Acute hypoxic resp failure Pulmonary edema Suspect VAP P:   Cont vent support - settings reviewed and/or adjusted Cont vent bundle Increase volume removal  -will need aggressive vent support  -ABG pending today -wean peep to 5 if tolerated  CARDIOVASCULAR A:  Shock, recurrent P:  MAP goal > 65 mmHg Cont norepinephrine infusion Cont hydrocortisone - wean dose as permitted by BP  RENAL A:   AKI - likely ATN + NSAIDS Severe lactic acidosis, resolved Severe hypervolemia P:   Monitor BMET intermittently Monitor I/Os Correct electrolytes as indicated CRRT per Renal service   GASTROINTESTINAL A:   Heme + stools P:   SUP: enteral PPI Cont TFs  HEMATOLOGIC A:   Polycythemia, resolved Mild anemia, acute blood loss - hemodynamically stable Severe thrombocytopenia  P:  DVT px:  SCDs Monitor CBC intermittently Transfuse per usual guidelines Platelet transfusion 04/02 prior to HD cath placement  INFECTIOUS A:   Severe sepsis Vent assoc PNA P:   Monitor temp, WBC count Micro and abx as above  ENDOCRINE A:   Mild stress induced hyperglycemia Risk of hypoglycemia in setting of AKI P:   DC SSI CBG q 8 hrs Consider resuming SSI for glu > 180  NEUROLOGIC A:   Acute ICU acquired encephalopathy ICU/vent associated discomfort P:   RASS goal: -2, -3 Transition dex to midaz Cont fentanyl infusion Cont PRN midazolam Cont PRN fentanyl   I have personally obtained a history, examined the patient, evaluated Pertinent laboratory and [employer]/imaging results, and  formulated the assessment and plan   The Patient requires high complexity decision making for assessment and support, frequent evaluation and titration of  therapies, application of advanced monitoring technologies and extensive interpretation of multiple databases. Critical Care Time devoted to patient care services described in this note is 45 minutes.   Overall, patient is critically ill, prognosis is guarded.  Patient with Multiorgan failure and at high risk for cardiac arrest and death.    Lucie LeatherKurian David Mattison Golay, M.D.  [facility]  Medical Director [facility] Medical Director [facility]

## 2015-09-04 NOTE — Progress Notes (Signed)
[facility]  ROUNDING NOTE   Subjective:  Patient remains critically ill. Family at bedisde CRRT continued.   UOP 50 cc overnight Off of all pressors Total UF of 3 L last 24 hrs  Objective:  Vital signs in last 24 hours:  Temp:  [98.6 F (37 C)-99.3 F (37.4 C)] 98.6 F (37 C) (04/04 1200) Pulse Rate:  [108-113] 108 (04/04 1200) Resp:  [4-28] 25 (04/04 1200) SpO2:  [92 %-100 %] 92 % (04/04 1200) Arterial Line BP: (78-135)/(49-94) 129/59 mmHg (04/04 1200) FiO2 (%):  [35 %-50 %] 35 % (04/04 0430) Weight:  [115.5 kg (254 lb 10.1 oz)] 115.5 kg (254 lb 10.1 oz) (04/04 0440)  Weight change: -0.6 kg (-1 lb 5.2 oz) Filed Weights   09/02/15 0500 09/03/15 0439 09/04/15 0440  Weight: 113.2 kg (249 lb 9 oz) 116.1 kg (255 lb 15.3 oz) 115.5 kg (254 lb 10.1 oz)    Intake/Output: I/O last 3 completed shifts: In: 3098.4 [I.V.:1538.4; NG/GT:1010; IV Piggyback:550] Out: 8366 [Urine:50; Other:3980]   Intake/Output this shift:  Total I/O In: 770 [I.V.:220; NG/GT:400; IV Piggyback:150] Out: 449 [Other:449]  Physical Exam: General: Critically ill   Head: +ETT +OGT  Eyes: Eyes closed, pupils small, round  Neck:  trachea midline, left subclavian triple lumen  Lungs:  Clear to auscultation, FiO2 35%, PEEP 10  Heart: regular  Abdomen:  Soft, nontender  Extremities: ++edema, Massive Scrotal edema  Neurologic: Sedated, intubated.   Skin: No lesions  Access: Left IJ TLC    Basic Metabolic Panel:  Recent Labs Lab 08/31/15 0447  09/01/15 0435  09/02/15 0519  09/03/15 0443 09/03/15 1951 09/04/15 0012 09/04/15 0421 09/04/15 0836  NA 134*  < > 135  < > 137  < > 137 136 138 137 137  K 3.7  < > 3.9  < > 3.6  < > 3.7 4.0 3.9 3.7 3.9  CL 103  < > 102  < > 104  < > 104 105 106 106 107  CO2 23  < > 29  < > 26  < > _0 GLUCOSE 152*  < > 145*  < > 144*  < > 117* 133* 130* 112* 113*  BUN 51*  < > 66*  < > 73*  < > 70* 66* 67* 67* 68*  CREATININE 2.24*  < > 3.29*   < > 3.44*  < > 3.28* 3.11* 3.19* 3.31* 3.37*  CALCIUM 7.4*  < > 7.1*  < > 7.3*  < > 7.2* 7.5* 7.6* 7.7* 7.5*  MG 2.1  --  2.3  --  2.2  --  2.1  --   --  2.3  --   PHOS 2.3*  --  1.9*  --  3.0  --  2.5  --   --  2.4*  --   < > = values in this interval not displayed.  Liver Function Tests:  Recent Labs Lab 08/30/15 1543 08/31/15 0915 08/31/15 1735 09/01/15 0435 09/03/15 0443  AST 296* 495* 497* 442* 287*  ALT 87* 135* 147* 148* 193*  ALKPHOS 34* 37* 38 48 84  BILITOT 0.8 0.9 0.7 0.7 0.9  PROT 3.7* 4.4* 4.0* 4.1* 4.0*  ALBUMIN 2.0* 2.9* 2.3* 2.2* 1.6*    Recent Labs Lab 08/31/15 0915  LIPASE 11  AMYLASE 29   No results for input(s): AMMONIA in the last 168 hours.  CBC:  Recent Labs Lab 08/30/15 1543 08/31/15 0447 09/01/15 0435 09/02/15 2947  09/03/15 0443  WBC 23.4* 18.9* 20.1* 19.5* 19.0*  NEUTROABS 21.7* 16.6*  --   --   --   HGB 11.9* 11.0* 10.3* 9.5* 9.1*  HCT 36.0* 32.5* 30.9* 28.9* 28.0*  MCV 89.3 89.0 90.7 91.9 93.0  PLT 28* 26* 56* 33* 66*    Cardiac Enzymes:  Recent Labs Lab 08/28/15 2133 08/29/15 0439  08/30/15 1543 08/31/15 0915 09/01/15 0435 09/02/15 0519 09/02/15 1419 09/03/15 0443  CKTOTAL  --  7210*  < > 14001* 22702* 72620* 17243*  --  5397*  CKMB  --   --   --  144.6*  --   --   --   --   --   TROPONINI 0.41* 0.36*  --  0.50*  --   --   --  0.32* 0.23*  < > = values in this interval not displayed.  BNP: Invalid input(s): POCBNP  CBG:  Recent Labs Lab 09/03/15 0722 09/03/15 1620 09/04/15 0012 09/04/15 0014 09/04/15 0840  GLUCAP 93 107* 128* 122* 104*    Microbiology: Results for orders placed or performed during the hospital encounter of 08/27/15  Rapid Influenza A&B Antigens ([facility] only)     Status: None   Collection Time: 08/27/15 12:22 PM  Result Value Ref Range Status   Influenza A ([place]) NEGATIVE NEGATIVE Final   Influenza B ([facility]) NEGATIVE NEGATIVE Final  Urine culture     Status: None   Collection Time:  08/27/15  7:40 PM  Result Value Ref Range Status   Specimen Description URINE, RANDOM  Final   Special Requests Normal  Final   Culture NO GROWTH 1 DAY  Final   Report Status 08/29/2015 FINAL  Final  MRSA PCR Screening     Status: None   Collection Time: 08/28/15  5:25 PM  Result Value Ref Range Status   MRSA by PCR NEGATIVE NEGATIVE Final    Comment:        The GeneXpert MRSA Assay (FDA approved for NASAL specimens only), is one component of a comprehensive MRSA colonization surveillance program. It is not intended to diagnose MRSA infection nor to guide or monitor treatment for MRSA infections.   Culture, blood (Routine X 2) w Reflex to ID Panel     Status: None   Collection Time: 08/28/15  5:51 PM  Result Value Ref Range Status   Specimen Description BLOOD RIGHT HAND  Final   Special Requests BAA,ANA,AER,5ML  Final   Culture NO GROWTH 5 DAYS  Final   Report Status 09/02/2015 FINAL  Final  Culture, blood (Routine X 2) w Reflex to ID Panel     Status: None   Collection Time: 08/28/15  5:51 PM  Result Value Ref Range Status   Specimen Description BLOOD RIGHT ASSIST CONTROL  Final   Special Requests BAA,ANA,AER,5ML  Final   Culture NO GROWTH 5 DAYS  Final   Report Status 09/02/2015 FINAL  Final  Culture, respiratory (NON-Expectorated)     Status: None   Collection Time: 08/29/15  2:16 PM  Result Value Ref Range Status   Specimen Description TRACHEAL ASPIRATE  Final   Special Requests NONE  Final   Gram Stain FEW WBC SEEN FEW YEAST   Final   Culture Consistent with normal respiratory flora.  Final   Report Status 09/01/2015 FINAL  Final  Culture, respiratory (NON-Expectorated)     Status: None   Collection Time: 08/31/15  3:45 PM  Result Value Ref Range Status   Specimen Description TRACHEAL ASPIRATE  Final   Special Requests NONE  Final   Gram Stain   Final    FEW WBC SEEN MANY GRAM POSITIVE RODS MODERATE GRAM POSITIVE COCCI RARE YEAST    Culture HEAVY GROWTH  ENTEROCOCCUS FAECALIS  Final   Report Status 09/03/2015 FINAL  Final   Organism ID, Bacteria ENTEROCOCCUS FAECALIS  Final      Susceptibility   Enterococcus faecalis - MIC*    AMPICILLIN <=2 SENSITIVE Sensitive     VANCOMYCIN 1 SENSITIVE Sensitive     GENTAMICIN SYNERGY SENSITIVE Sensitive     LINEZOLID 2 SENSITIVE Sensitive     * HEAVY GROWTH ENTEROCOCCUS FAECALIS  Culture, blood (Routine X 2) w Reflex to ID Panel     Status: None (Preliminary result)   Collection Time: 09/02/15  3:18 PM  Result Value Ref Range Status   Specimen Description BLOOD LINE  Final   Special Requests   Final    BOTTLES DRAWN AEROBIC AND ANAEROBIC  AER 4CC ANA 1CC   Culture NO GROWTH 2 DAYS  Final   Report Status PENDING  Incomplete  Culture, blood (Routine X 2) w Reflex to ID Panel     Status: None (Preliminary result)   Collection Time: 09/02/15  3:20 PM  Result Value Ref Range Status   Specimen Description BLOOD RIGHT ARM  Final   Special Requests BOTTLES DRAWN AEROBIC AND ANAEROBIC  8CC  Final   Culture NO GROWTH 2 DAYS  Final   Report Status PENDING  Incomplete  Culture, respiratory (NON-Expectorated)     Status: None (Preliminary result)   Collection Time: 09/02/15  4:30 PM  Result Value Ref Range Status   Specimen Description TRACHEAL ASPIRATE  Final   Special Requests NONE  Final   Gram Stain   Final    MODERATE WBC SEEN FEW GRAM POSITIVE COCCI IN PAIRS    Culture   Final    LIGHT GROWTH GRAM NEGATIVE RODS IDENTIFICATION TO FOLLOW ONCE BETTER GROWTH    Report Status PENDING  Incomplete  C difficile quick scan w PCR reflex     Status: None   Collection Time: 09/02/15  4:56 PM  Result Value Ref Range Status   C Diff antigen NEGATIVE NEGATIVE Final   C Diff toxin NEGATIVE NEGATIVE Final   C Diff interpretation Negative for C. difficile  Final    Coagulation Studies: No results for input(s): LABPROT, INR in the last 72 hours.  Urinalysis: No results for input(s): COLORURINE, LABSPEC,  PHURINE, GLUCOSEU, HGBUR, BILIRUBINUR, KETONESUR, PROTEINUR, UROBILINOGEN, NITRITE, LEUKOCYTESUR in the last 72 hours.  Invalid input(s): APPERANCEUR    Imaging: Dg Chest Port 1 View  09/03/2015  CLINICAL DATA:  Respiratory failure EXAM: PORTABLE CHEST 1 VIEW COMPARISON:  09/02/2015 FINDINGS: Support devices stable. Stable cardiac silhouette. Extensive airspace disease throughout the left lung stable. Multifocal less extensive airspace disease is present on the right. Opacity in the right lower lobe slightly worse. IMPRESSION: Extensive bilateral opacities very similar to prior study with minimal interval increase in severity on the right. Electronically Signed   By: Skipper Cliche M.D.   On: 09/03/2015 07:09   Dg Chest Port 1 View  09/02/2015  CLINICAL DATA:  Central line placed EXAM: PORTABLE CHEST 1 VIEW COMPARISON:  Chest x-ray from earlier same day and chest x-ray dated 08/31/2015. FINDINGS: Left IJ central line in place with tip adequately positioned at the level of the mid SVC. Endotracheal tube remains well positioned with tip approximately 3 cm above the  carina. Enteric tube passes below the diaphragm. Increased opacity noted within the left perihilar lung suggesting pulmonary edema pattern. Stable opacities within the right lung are also likely pulmonary edema. Stable dense opacity at the left lung base is probably atelectasis. Suspect associated small left pleural effusion. IMPRESSION: 1. Left IJ central line in place with tip adequately positioned at the level of the mid SVC. No pneumothorax or other procedural complicating feature. 2. Increased opacities within the left perihilar lung highly suggestive of pulmonary edema pattern, suggesting worsened fluid status. Stable opacities within the right perihilar lung are also most likely pulmonary edema. Dense opacity at the left lung base most likely atelectasis with small adjacent pleural effusion. Electronically Signed   By: Franki Cabot M.D.    On: 09/02/2015 15:07     Medications:   . feeding supplement (VITAL 1.5 CAL) 1,000 mL (09/02/15 1806)  . fentaNYL infusion INTRAVENOUS 300 mcg/hr (09/04/15 3567)  . midazolam (VERSED) infusion 4 mg/hr (09/04/15 0326)  . norepinephrine (LEVOPHED) Adult infusion Stopped (09/03/15 1614)  . pureflow 2,000 mL/hr at 09/03/15 1100   . sodium chloride   Intravenous Once  . sodium chloride   Intravenous Once  . albumin human  12.5 g Intravenous TID  . antiseptic oral rinse  7 mL Mouth Rinse QID  .  ceFAZolin (ANCEF) IV  2 g Intravenous Q12H  . chlorhexidine gluconate (SAGE KIT)  15 mL Mouth Rinse BID  . feeding supplement (PRO-STAT SUGAR FREE 64)  30 mL Oral QID  . free water  30 mL Per Tube 6 times per day  . methylPREDNISolone (SOLU-MEDROL) injection  40 mg Intravenous Q12H  . pantoprazole sodium  40 mg Per Tube Q1200   acetaminophen **OR** acetaminophen, albuterol, bisacodyl, fentaNYL, heparin, midazolam, sodium chloride, sodium chloride flush, vecuronium  Assessment/ Plan:  Mr. John Hoover is a 50 y.o. white male with no specific past medical history, who was admitted to [facility] on 08/27/2015 + Ibuprofen use prior to admission (600 mg TID x 2 days)  1. Acute Renal Failure, oliguric. Likely secondary to ATN 2. Metabolic Acidosis 3. Rhabdomyolysis 4. Acute respiratory failure 5. Influenza A positive 6. Hypoalbuminemia 7. Anasarca  Plan Patient with anuric to oliguric urine renal failure, anasarca, requiring vasopressors  Continue CRRT: CVVHD 4K bath, BFR 250 DFR 2.0 litre. UF 150 cc/hr - q 4 hour BMP.  - Added low dose iv albumin for oncotic support (4/3) - will follow    LOS: 8 Raj Landress 4/4/201712:29 PM

## 2015-09-05 ENCOUNTER — Inpatient Hospital Stay: Payer: BLUE CROSS/BLUE SHIELD

## 2015-09-05 DIAGNOSIS — N5089 Other specified disorders of the male genital organs: Secondary | ICD-10-CM

## 2015-09-05 LAB — BASIC METABOLIC PANEL
ANION GAP: 5 (ref 5–15)
ANION GAP: 6 (ref 5–15)
ANION GAP: 6 (ref 5–15)
ANION GAP: 7 (ref 5–15)
Anion gap: 6 (ref 5–15)
BUN: 74 mg/dL — ABNORMAL HIGH (ref 6–20)
BUN: 71 mg/dL — AB (ref 6–20)
BUN: 71 mg/dL — AB (ref 6–20)
BUN: 72 mg/dL — ABNORMAL HIGH (ref 6–20)
BUN: 82 mg/dL — ABNORMAL HIGH (ref 6–20)
CALCIUM: 7.7 mg/dL — AB (ref 8.9–10.3)
CALCIUM: 7.8 mg/dL — AB (ref 8.9–10.3)
CALCIUM: 8 mg/dL — AB (ref 8.9–10.3)
CHLORIDE: 105 mmol/L (ref 101–111)
CHLORIDE: 105 mmol/L (ref 101–111)
CHLORIDE: 107 mmol/L (ref 101–111)
CHLORIDE: 108 mmol/L (ref 101–111)
CO2: 23 mmol/L (ref 22–32)
CO2: 24 mmol/L (ref 22–32)
CO2: 24 mmol/L (ref 22–32)
CO2: 24 mmol/L (ref 22–32)
CO2: 25 mmol/L (ref 22–32)
CREATININE: 3.37 mg/dL — AB (ref 0.61–1.24)
Calcium: 8 mg/dL — ABNORMAL LOW (ref 8.9–10.3)
Calcium: 7.4 mg/dL — ABNORMAL LOW (ref 8.9–10.3)
Chloride: 107 mmol/L (ref 101–111)
Creatinine, Ser: 3.31 mg/dL — ABNORMAL HIGH (ref 0.61–1.24)
Creatinine, Ser: 3.37 mg/dL — ABNORMAL HIGH (ref 0.61–1.24)
Creatinine, Ser: 3.46 mg/dL — ABNORMAL HIGH (ref 0.61–1.24)
Creatinine, Ser: 3.78 mg/dL — ABNORMAL HIGH (ref 0.61–1.24)
GFR calc Af Amer: 23 mL/min — ABNORMAL LOW (ref >60)
GFR calc non Af Amer: 20 mL/min — ABNORMAL LOW (ref >60)
GFR calc non Af Amer: 20 mL/min — ABNORMAL LOW (ref >60)
GFR calc non Af Amer: 20 mL/min — ABNORMAL LOW (ref >60)
GFR calc non Af Amer: 17 mL/min — ABNORMAL LOW (ref >60)
GFR, EST AFRICAN AMERICAN: 20 mL/min — AB (ref >60)
GFR, EST AFRICAN AMERICAN: 22 mL/min — AB (ref >60)
GFR, EST AFRICAN AMERICAN: 23 mL/min — AB (ref >60)
GFR, EST AFRICAN AMERICAN: 23 mL/min — AB (ref >60)
GFR, EST NON AFRICAN AMERICAN: 19 mL/min — AB (ref >60)
GLUCOSE: 190 mg/dL — AB (ref 65–99)
Glucose, Bld: 156 mg/dL — ABNORMAL HIGH (ref 65–99)
Glucose, Bld: 165 mg/dL — ABNORMAL HIGH (ref 65–99)
Glucose, Bld: 167 mg/dL — ABNORMAL HIGH (ref 65–99)
Glucose, Bld: 167 mg/dL — ABNORMAL HIGH (ref 65–99)
POTASSIUM: 4.7 mmol/L (ref 3.5–5.1)
Potassium: 4.7 mmol/L (ref 3.5–5.1)
Potassium: 4.7 mmol/L (ref 3.5–5.1)
Potassium: 4.8 mmol/L (ref 3.5–5.1)
Potassium: 5 mmol/L (ref 3.5–5.1)
SODIUM: 137 mmol/L (ref 135–145)
SODIUM: 138 mmol/L (ref 135–145)
Sodium: 138 mmol/L (ref 135–145)
Sodium: 135 mmol/L (ref 135–145)
Sodium: 134 mmol/L — ABNORMAL LOW (ref 135–145)

## 2015-09-05 LAB — CBC
HCT: 21.5 % — ABNORMAL LOW (ref 40.0–52.0)
HEMOGLOBIN: 6.8 g/dL — AB (ref 13.0–18.0)
MCH: 29.1 pg (ref 26.0–34.0)
MCHC: 31.8 g/dL — AB (ref 32.0–36.0)
MCV: 91.7 fL (ref 80.0–100.0)
Platelets: 66 10*3/uL — ABNORMAL LOW (ref 150–440)
RBC: 2.34 MIL/uL — ABNORMAL LOW (ref 4.40–5.90)
RDW: 15.3 % — AB (ref 11.5–14.5)
WBC: 17.1 10*3/uL — ABNORMAL HIGH (ref 3.8–10.6)

## 2015-09-05 LAB — GLUCOSE, CAPILLARY
GLUCOSE-CAPILLARY: 138 mg/dL — AB (ref 65–99)
Glucose-Capillary: 145 mg/dL — ABNORMAL HIGH (ref 65–99)
Glucose-Capillary: 153 mg/dL — ABNORMAL HIGH (ref 65–99)
Glucose-Capillary: 165 mg/dL — ABNORMAL HIGH (ref 65–99)
Glucose-Capillary: 175 mg/dL — ABNORMAL HIGH (ref 65–99)

## 2015-09-05 LAB — PHOSPHORUS: Phosphorus: 2.5 mg/dL (ref 2.5–4.6)

## 2015-09-05 LAB — ANTINUCLEAR ANTIBODIES, IFA: ANA Ab, IFA: NEGATIVE

## 2015-09-05 LAB — SEROTONIN RELEASE ASSAY (SRA)
SRA .2 IU/mL UFH Ser-aCnc: 3 % (ref 0–20)
SRA 100IU/mL UFH Ser-aCnc: 4 % (ref 0–20)

## 2015-09-05 LAB — CK: Total CK: 2906 U/L — ABNORMAL HIGH (ref 49–397)

## 2015-09-05 LAB — HAPTOGLOBIN: Haptoglobin: 36 mg/dL (ref 34–200)

## 2015-09-05 LAB — PREPARE RBC (CROSSMATCH)

## 2015-09-05 LAB — MAGNESIUM: MAGNESIUM: 2.5 mg/dL — AB (ref 1.7–2.4)

## 2015-09-05 LAB — HEPARIN INDUCED PLATELET AB (HIT ANTIBODY): Heparin Induced Plt Ab: 0.141 OD (ref 0.000–0.400)

## 2015-09-05 MED ORDER — STERILE WATER FOR INJECTION IJ SOLN
INTRAMUSCULAR | Status: AC
  Administered 2015-09-05: 10 mL
  Filled 2015-09-05: qty 10

## 2015-09-05 MED ORDER — ALTEPLASE 2 MG IJ SOLR
2.0000 mg | Freq: Once | INTRAMUSCULAR | Status: AC
  Administered 2015-09-06: 2 mg
  Filled 2015-09-05 (×2): qty 2

## 2015-09-05 MED ORDER — VITAL 1.5 CAL PO LIQD
1000.0000 mL | ORAL | Status: DC
  Administered 2015-09-06 – 2015-09-08 (×3): 1000 mL

## 2015-09-05 MED ORDER — ALTEPLASE 2 MG IJ SOLR
2.0000 mg | Freq: Once | INTRAMUSCULAR | Status: AC
  Administered 2015-09-05: 2 mg
  Filled 2015-09-05: qty 2

## 2015-09-05 MED ORDER — ALTEPLASE 100 MG IV SOLR
2.0000 mg | Freq: Once | INTRAVENOUS | Status: DC
  Filled 2015-09-05 (×2): qty 2

## 2015-09-05 MED ORDER — SODIUM CHLORIDE 0.9 % IV SOLN
Freq: Once | INTRAVENOUS | Status: AC
  Administered 2015-09-05: 11:00:00 via INTRAVENOUS

## 2015-09-05 MED ORDER — ALTEPLASE 100 MG IV SOLR
2.0000 mg | Freq: Once | INTRAVENOUS | Status: DC
  Administered 2015-09-05: 2 mg
  Filled 2015-09-05: qty 2

## 2015-09-05 MED ORDER — PIPERACILLIN-TAZOBACTAM 3.375 G IVPB
3.3750 g | Freq: Three times a day (TID) | INTRAVENOUS | Status: DC
  Administered 2015-09-05 – 2015-09-07 (×7): 3.375 g via INTRAVENOUS
  Filled 2015-09-05 (×8): qty 50

## 2015-09-05 MED ORDER — ALTEPLASE 2 MG IJ SOLR
2.0000 mg | Freq: Once | INTRAMUSCULAR | Status: AC
  Administered 2015-09-05: 2 mg

## 2015-09-05 MED ORDER — STERILE WATER FOR INJECTION IJ SOLN
INTRAMUSCULAR | Status: AC
  Administered 2015-09-05: 05:00:00
  Filled 2015-09-05: qty 10

## 2015-09-05 NOTE — Progress Notes (Signed)
Upon arrival at shift change, CRRT had clotted off at the venous access.  CRRT was not running.  Per AM nurse, cathflo had been ordered to instill in lines. As of 20:18,  Venous access now has the cath flo instilled.  Waiting on the second cath flo to be released in order to instill in arterial line.  Pharmacy aware of problem as well as charge nurse.

## 2015-09-05 NOTE — Progress Notes (Signed)
Pharmacy Antibiotic Note  John Hoover is a 50 y.o. male admitted on 08/27/2015 with pneumonia.  Pharmacy has been consulted for Zosyn dosing. Patient previously on ampicillin for enterococcus with trach aspirate now growing GNR.  Plan: Zosyn 3.375g IV q8h (4 hour infusion).  Height: 6\' 1"  (185.4 cm) Weight: 254 lb 10.1 oz (115.5 kg) IBW/kg (Calculated) : 79.9  Temp (24hrs), Avg:98.5 F (36.9 C), Min:97.3 F (36.3 C), Max:99.5 F (37.5 C)   Recent Labs Lab 08/30/15 1543  08/31/15 0447  09/01/15 0435  09/02/15 0519  09/03/15 0443  09/04/15 1600 09/04/15 2004 09/04/15 2355 09/05/15 0436 09/05/15 0437 09/05/15 0727  WBC 23.4*  --  18.9*  --  20.1*  --  19.5*  --  19.0*  --   --   --   --   --  17.1*  --   CREATININE 2.52*  < > 2.24*  < > 3.29*  < > 3.44*  < > 3.28*  < > 3.04* 3.47* 3.46* 3.37*  --  3.31*  LATICACIDVEN 5.3*  --   --   --   --   --   --   --   --   --   --   --   --   --   --   --   VANCOTROUGH 8*  --   --   --   --   --   --   --   --   --   --   --   --   --   --   --   < > = values in this interval not displayed.  Estimated Creatinine Clearance: 35.1 mL/min (by C-G formula based on Cr of 3.31).    No Known Allergies  Antimicrobials this admission: Anti-infectives    Start     Dose/Rate Route Frequency Ordered Stop   09/05/15 1200  piperacillin-tazobactam (ZOSYN) IVPB 3.375 g     3.375 g 12.5 mL/hr over 240 Minutes Intravenous 3 times per day 09/05/15 1108     09/04/15 1515  ampicillin (OMNIPEN) 2 g in sodium chloride 0.9 % 50 mL IVPB  Status:  Discontinued     2 g 150 mL/hr over 20 Minutes Intravenous 3 times per day 09/04/15 1511 09/05/15 1107   09/03/15 2200  ceFAZolin (ANCEF) IVPB 2g/100 mL premix  Status:  Discontinued     2 g 200 mL/hr over 30 Minutes Intravenous Every 12 hours 09/03/15 1251 09/04/15 1511   09/03/15 1300  ceFAZolin (ANCEF) IVPB 2g/100 mL premix  Status:  Discontinued     2 g 200 mL/hr over 30 Minutes Intravenous Every 12  hours 09/03/15 1250 09/03/15 1251   09/03/15 0030  vancomycin (VANCOCIN) IVPB 750 mg/150 ml premix  Status:  Discontinued     750 mg 150 mL/hr over 60 Minutes Intravenous Every 24 hours 09/02/15 1454 09/03/15 1124   09/02/15 1530  vancomycin (VANCOCIN) IVPB 750 mg/150 ml premix     750 mg 150 mL/hr over 60 Minutes Intravenous  Once 09/02/15 1454 09/02/15 1651   09/02/15 1445  vancomycin (VANCOCIN) 50 mg/mL oral solution 125 mg  Status:  Discontinued     125 mg Per Tube 4 times per day 09/02/15 1433 09/03/15 0052   08/28/15 2200  ceFEPIme (MAXIPIME) 2 g in dextrose 5 % 50 mL IVPB  Status:  Discontinued     2 g 100 mL/hr over 30 Minutes Intravenous Every 12 hours 08/28/15 1726 09/03/15 1124  08/28/15 2200  oseltamivir (TAMIFLU) 6 MG/ML suspension 30 mg  Status:  Discontinued     30 mg Per Tube 2 times daily 08/28/15 1741 08/28/15 1855   08/28/15 2000  vancomycin (VANCOCIN) IVPB 1000 mg/200 mL premix  Status:  Discontinued     1,000 mg 200 mL/hr over 60 Minutes Intravenous Every 24 hours 08/28/15 1838 08/30/15 1637   08/28/15 1900  oseltamivir (TAMIFLU) 6 MG/ML suspension 30 mg  Status:  Discontinued     30 mg Per Tube Daily 08/28/15 1855 09/01/15 1056   08/28/15 1745  vancomycin (VANCOCIN) 1,250 mg in sodium chloride 0.9 % 250 mL IVPB  Status:  Discontinued     1,250 mg 166.7 mL/hr over 90 Minutes Intravenous STAT 08/28/15 1730 08/28/15 1837   08/27/15 2200  oseltamivir (TAMIFLU) capsule 75 mg  Status:  Discontinued     75 mg Oral 2 times daily 08/27/15 1806 08/27/15 1814   08/27/15 1900  cefTRIAXone (ROCEPHIN) 1 g in dextrose 5 % 50 mL IVPB  Status:  Discontinued     1 g 100 mL/hr over 30 Minutes Intravenous Every 24 hours 08/27/15 1806 08/28/15 1420      Dose adjustments this admission:   Microbiology results: Results for orders placed or performed during the hospital encounter of 08/27/15  Rapid Influenza A&B Antigens ([facility] only)     Status: None   Collection Time: 08/27/15  12:22 PM  Result Value Ref Range Status   Influenza A ([facility]) NEGATIVE NEGATIVE Final   Influenza B ([facility]) NEGATIVE NEGATIVE Final  Urine culture     Status: None   Collection Time: 08/27/15  7:40 PM  Result Value Ref Range Status   Specimen Description URINE, RANDOM  Final   Special Requests Normal  Final   Culture NO GROWTH 1 DAY  Final   Report Status 08/29/2015 FINAL  Final  MRSA PCR Screening     Status: None   Collection Time: 08/28/15  5:25 PM  Result Value Ref Range Status   MRSA by PCR NEGATIVE NEGATIVE Final    Comment:        The GeneXpert MRSA Assay (FDA approved for NASAL specimens only), is one component of a comprehensive MRSA colonization surveillance program. It is not intended to diagnose MRSA infection nor to guide or monitor treatment for MRSA infections.   Culture, blood (Routine X 2) w Reflex to ID Panel     Status: None   Collection Time: 08/28/15  5:51 PM  Result Value Ref Range Status   Specimen Description BLOOD RIGHT HAND  Final   Special Requests BAA,ANA,AER,5ML  Final   Culture NO GROWTH 5 DAYS  Final   Report Status 09/02/2015 FINAL  Final  Culture, blood (Routine X 2) w Reflex to ID Panel     Status: None   Collection Time: 08/28/15  5:51 PM  Result Value Ref Range Status   Specimen Description BLOOD RIGHT ASSIST CONTROL  Final   Special Requests BAA,ANA,AER,5ML  Final   Culture NO GROWTH 5 DAYS  Final   Report Status 09/02/2015 FINAL  Final  Culture, respiratory (NON-Expectorated)     Status: None   Collection Time: 08/29/15  2:16 PM  Result Value Ref Range Status   Specimen Description TRACHEAL ASPIRATE  Final   Special Requests NONE  Final   Gram Stain FEW WBC SEEN FEW YEAST   Final   Culture Consistent with normal respiratory flora.  Final   Report Status 09/01/2015 FINAL  Final  Culture, respiratory (NON-Expectorated)     Status: None   Collection Time: 08/31/15  3:45 PM  Result Value Ref Range Status   Specimen Description  TRACHEAL ASPIRATE  Final   Special Requests NONE  Final   Gram Stain   Final    FEW WBC SEEN MANY GRAM POSITIVE RODS MODERATE GRAM POSITIVE COCCI RARE YEAST    Culture HEAVY GROWTH ENTEROCOCCUS FAECALIS  Final   Report Status 09/03/2015 FINAL  Final   Organism ID, Bacteria ENTEROCOCCUS FAECALIS  Final      Susceptibility   Enterococcus faecalis - MIC*    AMPICILLIN <=2 SENSITIVE Sensitive     VANCOMYCIN 1 SENSITIVE Sensitive     GENTAMICIN SYNERGY SENSITIVE Sensitive     LINEZOLID 2 SENSITIVE Sensitive     * HEAVY GROWTH ENTEROCOCCUS FAECALIS  Culture, blood (Routine X 2) w Reflex to ID Panel     Status: None (Preliminary result)   Collection Time: 09/02/15  3:18 PM  Result Value Ref Range Status   Specimen Description BLOOD LINE  Final   Special Requests   Final    BOTTLES DRAWN AEROBIC AND ANAEROBIC  AER 4CC ANA 1CC   Culture NO GROWTH 3 DAYS  Final   Report Status PENDING  Incomplete  Culture, blood (Routine X 2) w Reflex to ID Panel     Status: None (Preliminary result)   Collection Time: 09/02/15  3:20 PM  Result Value Ref Range Status   Specimen Description BLOOD RIGHT ARM  Final   Special Requests BOTTLES DRAWN AEROBIC AND ANAEROBIC  8CC  Final   Culture NO GROWTH 3 DAYS  Final   Report Status PENDING  Incomplete  Culture, respiratory (NON-Expectorated)     Status: None (Preliminary result)   Collection Time: 09/02/15  4:30 PM  Result Value Ref Range Status   Specimen Description TRACHEAL ASPIRATE  Final   Special Requests NONE  Final   Gram Stain   Final    MODERATE WBC SEEN FEW GRAM POSITIVE COCCI IN PAIRS    Culture   Final    LIGHT GROWTH GRAM NEGATIVE RODS IDENTIFICATION AND SUSCEPTIBILITIES TO FOLLOW    Report Status PENDING  Incomplete  C difficile quick scan w PCR reflex     Status: None   Collection Time: 09/02/15  4:56 PM  Result Value Ref Range Status   C Diff antigen NEGATIVE NEGATIVE Final   C Diff toxin NEGATIVE NEGATIVE Final   C Diff  interpretation Negative for C. difficile  Final    Thank you for allowing pharmacy to be a part of this patient's care.  Luisa Hart D 09/05/2015 4:16 PM

## 2015-09-05 NOTE — Consult Note (Signed)
Urology Consult  I have been asked to see the patient by Dr. Belia Heman, for evaluation and management of scrotal edema.  Chief Complaint: scrotal edema  History of Present Illness: John Hoover is a 50 y.o. year old male who is critically ill, intubated in the ICU from complications of influenza.   Hospital course has been complicated by shock with mutliorgan organ failure, metabolic acidosis, and rhabdomyolysis.  He remains intubated on CRRT.    In addition to his anasarca, he developed severe scrotal edema. I was asked to see the patient for further evaluation of this.   Past Medical History  Diagnosis Date  . Meniere's disease     Past Surgical History  Procedure Laterality Date  . Nasal sinus surgery      Home Medications:    Medication List    ASK your doctor about these medications        acetaminophen 325 MG tablet  Commonly known as:  TYLENOL  Take 650 mg by mouth every 6 (six) hours as needed.     azithromycin 1 g powder  Commonly known as:  ZITHROMAX  Take 1 g by mouth once.     ibuprofen 200 MG tablet  Commonly known as:  ADVIL,MOTRIN  Take 200 mg by mouth every 6 (six) hours as needed.        Allergies: No Known Allergies  Family History  Problem Relation Age of Onset  . Hypertension Mother   . Hypertension Father   . Colon cancer Neg Hx   . Prostate cancer Neg Hx     Social History:  reports that he has never smoked. He has never used smokeless tobacco. He reports that he drinks alcohol. He reports that he does not use illicit drugs.  ROS: Intubated. Unable to assess.  Physical Exam:  Vital signs in last 24 hours: Temp:  [97.3 F (36.3 C)-99.5 F (37.5 C)] 99.3 F (37.4 C) (04/05 1700) Pulse Rate:  [93-111] 102 (04/05 1700) Resp:  [15-34] 25 (04/05 1700) SpO2:  [92 %-100 %] 99 % (04/05 1700) Arterial Line BP: (90-165)/(52-89) 133/76 mmHg (04/05 1700) FiO2 (%):  [35 %] 35 % (04/05 1630) Constitutional:  Sedated. Intubated. HEENT:  [place] AT, moist mucus membranes.  Trachea midline, no masses Cardiovascular: Severe edema of the bilateral lower extremities, upper extremities.  Subtile Ischemic changes on the fingers and toes. GI: Abdomen is soft, nontender, nondistended, no abdominal masses GU: Massive benign appearing scrotal edema. No erythema or crepitus. Perineum intact. Small ecchymotic lesion on right lateral hemiscrotum. Severe penile edema with Foley catheter in place.  Laboratory Data:   Recent Labs  09/03/15 0443 09/05/15 0437  WBC 19.0* 17.1*  HGB 9.1* 6.8*  HCT 28.0* 21.5*    Recent Labs  09/05/15 0436 09/05/15 0727 09/05/15 1528  NA 138 138 135  K 4.7 4.7 4.8  CL 107 107 105  CO2 GLUCOSE 156* 190* 167*  BUN 71* 74* 72*  CREATININE 3.37* 3.31* 3.37*  CALCIUM 7.8* 8.0* 8.0*   No results for input(s): LABPT, INR in the last 72 hours. No results for input(s): LABURIN in the last 72 hours. Results for orders placed or performed during the hospital encounter of 08/27/15  Rapid Influenza A&B Antigens [facility] only)     Status: None   Collection Time: 08/27/15 12:22 PM  Result Value Ref Range Status   Influenza A ([facility]) NEGATIVE NEGATIVE Final   Influenza B ([facility]) NEGATIVE NEGATIVE Final  Urine culture     Status: None   Collection Time: 08/27/15  7:40 PM  Result Value Ref Range Status   Specimen Description URINE, RANDOM  Final   Special Requests Normal  Final   Culture NO GROWTH 1 DAY  Final   Report Status 08/29/2015 FINAL  Final  MRSA PCR Screening     Status: None   Collection Time: 08/28/15  5:25 PM  Result Value Ref Range Status   MRSA by PCR NEGATIVE NEGATIVE Final    Comment:        The GeneXpert MRSA Assay (FDA approved for NASAL specimens only), is one component of a comprehensive MRSA colonization surveillance program. It is not intended to diagnose MRSA infection nor to guide or monitor treatment for MRSA infections.   Culture, blood (Routine X 2) w Reflex to ID  Panel     Status: None   Collection Time: 08/28/15  5:51 PM  Result Value Ref Range Status   Specimen Description BLOOD RIGHT HAND  Final   Special Requests BAA,ANA,AER,5ML  Final   Culture NO GROWTH 5 DAYS  Final   Report Status 09/02/2015 FINAL  Final  Culture, blood (Routine X 2) w Reflex to ID Panel     Status: None   Collection Time: 08/28/15  5:51 PM  Result Value Ref Range Status   Specimen Description BLOOD RIGHT ASSIST CONTROL  Final   Special Requests BAA,ANA,AER,5ML  Final   Culture NO GROWTH 5 DAYS  Final   Report Status 09/02/2015 FINAL  Final  Culture, respiratory (NON-Expectorated)     Status: None   Collection Time: 08/29/15  2:16 PM  Result Value Ref Range Status   Specimen Description TRACHEAL ASPIRATE  Final   Special Requests NONE  Final   Gram Stain FEW WBC SEEN FEW YEAST   Final   Culture Consistent with normal respiratory flora.  Final   Report Status 09/01/2015 FINAL  Final  Culture, respiratory (NON-Expectorated)     Status: None   Collection Time: 08/31/15  3:45 PM  Result Value Ref Range Status   Specimen Description TRACHEAL ASPIRATE  Final   Special Requests NONE  Final   Gram Stain   Final    FEW WBC SEEN MANY GRAM POSITIVE RODS MODERATE GRAM POSITIVE COCCI RARE YEAST    Culture HEAVY GROWTH ENTEROCOCCUS FAECALIS  Final   Report Status 09/03/2015 FINAL  Final   Organism ID, Bacteria ENTEROCOCCUS FAECALIS  Final      Susceptibility   Enterococcus faecalis - MIC*    AMPICILLIN <=2 SENSITIVE Sensitive     VANCOMYCIN 1 SENSITIVE Sensitive     GENTAMICIN SYNERGY SENSITIVE Sensitive     LINEZOLID 2 SENSITIVE Sensitive     * HEAVY GROWTH ENTEROCOCCUS FAECALIS  Culture, blood (Routine X 2) w Reflex to ID Panel     Status: None (Preliminary result)   Collection Time: 09/02/15  3:18 PM  Result Value Ref Range Status   Specimen Description BLOOD LINE  Final   Special Requests   Final    BOTTLES DRAWN AEROBIC AND ANAEROBIC  AER 4CC ANA 1CC    Culture NO GROWTH 3 DAYS  Final   Report Status PENDING  Incomplete  Culture, blood (Routine X 2) w Reflex to ID Panel     Status: None (Preliminary result)   Collection Time: 09/02/15  3:20 PM  Result Value Ref Range Status   Specimen Description BLOOD RIGHT ARM  Final   Special Requests BOTTLES  DRAWN AEROBIC AND ANAEROBIC  8CC  Final   Culture NO GROWTH 3 DAYS  Final   Report Status PENDING  Incomplete  Culture, respiratory (NON-Expectorated)     Status: None (Preliminary result)   Collection Time: 09/02/15  4:30 PM  Result Value Ref Range Status   Specimen Description TRACHEAL ASPIRATE  Final   Special Requests NONE  Final   Gram Stain   Final    MODERATE WBC SEEN FEW GRAM POSITIVE COCCI IN PAIRS    Culture   Final    LIGHT GROWTH GRAM NEGATIVE RODS IDENTIFICATION AND SUSCEPTIBILITIES TO FOLLOW    Report Status PENDING  Incomplete  C difficile quick scan w PCR reflex     Status: None   Collection Time: 09/02/15  4:56 PM  Result Value Ref Range Status   C Diff antigen NEGATIVE NEGATIVE Final   C Diff toxin NEGATIVE NEGATIVE Final   C Diff interpretation Negative for C. difficile  Final    Radiologic Imaging: N/a  Impression/Assessment:  50 year old critically ill male with significant anasarca along with severe benign scrotal edema and penile edema. No evidence of scrotal pathology or infection.  Plan:  -Supportive, instructed nurses to keep scrotum elevated. -Intake and Foley catheter until penoscrotal edema has completely resolved and patient is no longer sedated/intubated  09/05/2015, 5:34 PM  Vanna Scotland,  MD

## 2015-09-05 NOTE — Progress Notes (Signed)
Dr. Belia HemanKasa at bedside assessing patient. RR 31-36 with abdominal muscle use and patient is restless.  Dr. Belia HemanKasa instructed RN to proceed with wake up assessment and to allow family to be at bedside.

## 2015-09-05 NOTE — Progress Notes (Signed)
Patient's [facility] Annabelle Harmanana, RN made Dr. Vassie LollAlva aware and MD stated that the morning ICU team would have to start that process.

## 2015-09-05 NOTE — Progress Notes (Signed)
Filter clotted therefore CRRT stopped at this time. Will change cartridge.

## 2015-09-05 NOTE — Progress Notes (Signed)
PULMONARY / CRITICAL CARE MEDICINE   Name: John Hoover MRN: [id number] DOB: 20-Jan-1966    ADMISSION DATE:  08/27/2015 CONSULTATION DATE:  08/28/15    PT PROFILE: John Hoover previously healthy 77 M admitted to gen med floor with several days of malaise and profound myalgias. Influenza PCR positive. On admission was noted to be hemoconcentrated with AKI and elevated CPK. On 03/28 developed progressive AMS and found to have profound metabolic acidosis. Transferred to ICU and PCCM consulted for management of acidosis, rhabdomyolysis, MODS, shock. Intubated, CVL placed and HD cath placed on arrival to ICU. Empiric abx initiated  MAJOR EVENTS/TEST RESULTS: 03/27 Admitted as above 03/28 transferred to ICU, intubated, CVL placed, HD cath placed, vasopressors initiated, Renal consult, CRRT initiated, HCO3 gtt initiated, empiric abx initated 03/28 CT head: NAD 03/28 Echocardiogram: LVEF 60-65%, grade one diastolic dysfunction 03/28 CTAP: no acute findings 03/30 BLE venous US: no DVT 03/31 Acidosis much improved. CRRT stopped, HCO3 gtt stopped 03/31 Transition off midaz infusion to dexmedetomidine 04/01 CRRT resumed. Back on low dose fentanyl infusion. Failed SBT. Trial furosemide with minimal response 04/02 Increasing agitation. Dexmedetomidine transitioned to propofol. R femoral HD cath not functioning and replaced after platelet transfusion. CXR pattern c/w edema, severe LE and scrotal edema - volume removal via CRRT 04/02 Worsening gas exchange requiring increased vent support and heavier sedation. HD cath required replacement. Platelets transfused prior to placement. Wife apprised several times over course of day 4/3 remains on CRRT very low UO 4/5 patient failed SAT/SBT due to resp muscle fatigue  INDWELLING DEVICES:: ETT 03/28 >>  L [place] CVL 03/28 >>  R femoral HD cath 03/28 >> 04/02 R femoral A-line 03/28 >>  R IJ HD cath 04/02 >>   MICRO DATA: Flu PCR 03/27 >> positive for A, neg for  H1N1 MRSA PCR 03/28 >> NEG Urine 03/27 >> NEG Blood 03/28 >> NEG Resp 03/29 >> NOF HIV 03/31 >> NEG Resp 03/31 >>enterococcus faecalis C diff 04/02 >> NEG Resp 04/02 >>    ANTIMICROBIALS:  Oseltamivir 03/27 >> 04/01 Vanc 03/28 >> 03/31 Cefepime 03/28 >>    SUBJECTIVE:  RASS -3 to +1. Intermittently F/C Remains intubated, failed sat/sbt family at bedside fio2 at 35% PEEP at 5, on CRRT, off vasopressors this AM   VITAL SIGNS: BP 104/73 mmHg  Pulse 95  Temp(Src) 98.2 F (36.8 C) (Core (Comment))  Resp 24  Ht  (1.854 m)  Wt 254 lb 10.1 oz (115.5 kg)  BMI 33.60 kg/m2  SpO2 100%  HEMODYNAMICS: CVP:  [5 mmHg-26 mmHg] 7 mmHg  VENTILATOR SETTINGS: Vent Mode:  [-] PRVC FiO2 (%):  [35 %] 35 % Set Rate:  [25 bmp] 25 bmp Vt Set:  [600 mL] 600 mL PEEP:  [5 cmH20] 5 cmH20  INTAKE / OUTPUT: I/O last 3 completed shifts: In: 3330.5 [I.V.:1390.5; NG/GT:1590; IV Piggyback:350] Out: 4110 [Urine:45; Other:4065]  PHYSICAL EXAMINATION: General: RASS -2, WDWN Neuro: PERRL, EOMI, MAEs, DTRs symmetric HEENT: NCAT, WNL Cardiovascular: Regular, no murmurs  Lungs: diffuse rhonchi, dependent crackles Abdomen: soft, NT, ND, diminished BS Ext: warm, + cyanosis, increasing LE edema GU: severe scrotal and penile edema  LABS:  BMET  Recent Labs Lab 09/04/15 2355 09/05/15 0436 09/05/15 0727  NA 137 138 138  K 4.7 4.7 4.7  CL 108 107 107  CO2 BUN 71* 71* 74*  CREATININE 3.46* 3.37* 3.31*  GLUCOSE 167* 156* 190*    Electrolytes  Recent Labs Lab 09/03/15 0443  09/04/15 0421  09/04/15 2355 09/05/15 0436 09/05/15 0437 09/05/15 0727  CALCIUM 7.2*  < > 7.7*  < > 7.7* 7.8*  --  8.0*  MG 2.1  --  2.3  --   --   --  2.5*  --   PHOS 2.5  --  2.4*  --   --   --  2.5  --   < > = values in this interval not displayed.  CBC  Recent Labs Lab 09/02/15 0519 09/03/15 0443 09/05/15 0437  WBC 19.5* 19.0* 17.1*  HGB 9.5* 9.1* 6.8*  HCT 28.9* 28.0* 21.5*  PLT  33* 66* 66*    Coag's  Recent Labs Lab 08/30/15 1543 08/31/15 0447  APTT  --  34  INR 1.21  --     Sepsis Markers  Recent Labs Lab 08/30/15 1543 09/01/15 1024 09/02/15 0519 09/03/15 0443  LATICACIDVEN 5.3*  --   --   --   PROCALCITON  --  2.28 2.12 32.90    ABG  Recent Labs Lab 09/02/15 0742 09/02/15 2200 09/04/15 0815  PHART 7.45 7.36 7.42  PCO2ART 40 48 46  PO2ART 54* 51* 86    Liver Enzymes  Recent Labs Lab 08/31/15 1735 09/01/15 0435 09/03/15 0443  AST 497* 442* 287*  ALT 147* 148* 193*  ALKPHOS 38 48 84  BILITOT 0.7 0.7 0.9  ALBUMIN 2.3* 2.2* 1.6*    Cardiac Enzymes  Recent Labs Lab 08/30/15 1543 09/02/15 1419 09/03/15 0443  TROPONINI 0.50* 0.32* 0.23*    Glucose  Recent Labs Lab 09/04/15 0012 09/04/15 0014 09/04/15 0840 09/04/15 1616 09/05/15 0018 09/05/15 0735  GLUCAP 128* 122* 104* 171* 165* 175*    CXR: increasing edema pattern   ASSESSMENT / PLAN: 50 yo white male with acute hypoxic resp failrue from acute INF A pneumonia with superimposed bacterial pneumonia complicated by acute rhabdo with acute and severe acidosis with ARF  PULMONARY A: Acute hypoxic resp failure Pulmonary edema Suspect VAP P:   Cont vent support - settings reviewed and/or adjusted Cont vent bundle Increase volume removal  -will need aggressive vent support  -wean peep to 5 if tolerated  CARDIOVASCULAR A:  Shock, recurrent P:  MAP goal > 65 mmHg Cont norepinephrine infusion Cont hydrocortisone - wean dose as permitted by BP  RENAL A:   AKI - likely ATN + NSAIDS Severe lactic acidosis, resolved Severe hypervolemia P:   Monitor BMET intermittently Monitor I/Os Correct electrolytes as indicated CRRT per Renal service   GASTROINTESTINAL A:   Heme + stools P:   SUP: enteral PPI Cont TFs  HEMATOLOGIC A:   Polycythemia, resolved Mild anemia, acute blood loss - hemodynamically stable Severe thrombocytopenia  P:  DVT px:  SCDs Monitor CBC intermittently Transfuse per usual guidelines Platelet transfusion 04/02 prior to HD cath placement  INFECTIOUS A:   Severe sepsis Vent assoc PNA P:   Monitor temp, WBC count Micro and abx as above  ENDOCRINE A:   Mild stress induced hyperglycemia Risk of hypoglycemia in setting of AKI P:   DC SSI CBG q 8 hrs Consider resuming SSI for glu > 180  NEUROLOGIC A:   Acute ICU acquired encephalopathy ICU/vent associated discomfort P:   RASS goal: -2, -3 Transition dex to midaz Cont fentanyl infusion Cont PRN midazolam Cont PRN fentanyl   I have personally obtained a history, examined the patient, evaluated Pertinent laboratory and [employer]/imaging results, and  formulated the assessment and plan   The Patient requires high  complexity decision making for assessment and support, frequent evaluation and titration of therapies, application of advanced monitoring technologies and extensive interpretation of multiple databases. Critical Care Time devoted to patient care services described in this note is 35 minutes.   Overall, patient is critically ill, prognosis is guarded.  Patient with Multiorgan failure and at high risk for cardiac arrest and death.    Lucie LeatherKurian David Moni Rothrock, M.D.  [facility]  Medical Director [facility] Medical Director [facility] Card[facility]

## 2015-09-05 NOTE — Progress Notes (Signed)
Dr. Belia HemanKasa in room and spoke with patient's wife and children.  Sedation decreased for wake up assessment this morning and patient became agitated and more restless, not following commands with increased work of breathing, RR 34-39.  Per Dr. Belia HemanKasa sedation resumed at original dose.  Continuing to monitor.

## 2015-09-05 NOTE — Progress Notes (Signed)
Dr. Thedore MinsSingh present and gave order to change metB to q6H frequency.

## 2015-09-05 NOTE — Progress Notes (Signed)
[facility]  ROUNDING NOTE   Subjective:  Patient remains critically ill.   CRRT continued.  Cathter malfunction last night, Cathflo used.  UOP 50 cc overnight Off of all pressors Total UF of ~ 2.4  L last 24 hrs  Objective:  Vital signs in last 24 hours:  Temp:  [97.3 F (36.3 C)-99.5 F (37.5 C)] 98.1 F (36.7 C) (04/05 0900) Pulse Rate:  [93-114] 101 (04/05 0900) Resp:  [15-30] 23 (04/05 0900) SpO2:  [92 %-100 %] 96 % (04/05 0900) Arterial Line BP: (90-166)/(50-96) 151/84 mmHg (04/05 0900) FiO2 (%):  [35 %] 35 % (04/05 0900)  Weight change:  Filed Weights   09/02/15 0500 09/03/15 0439 09/04/15 0440  Weight: 113.2 kg (249 lb 9 oz) 116.1 kg (255 lb 15.3 oz) 115.5 kg (254 lb 10.1 oz)    Intake/Output: I/O last 3 completed shifts: In: 3330.5 [I.V.:1390.5; NG/GT:1590; IV Piggyback:350] Out: 8828 [Urine:45; MKLKJ:1791]   Intake/Output this shift:  Total I/O In: 174 [I.V.:94; NG/GT:80] Out: -   Physical Exam: General: Critically ill   Head: +ETT +OGT  Eyes: Eyes closed, pupils small, round  Neck:  trachea midline, left subclavian triple lumen  Lungs:  Clear to auscultation, FiO2 35%, PEEP 5  Heart: regular  Abdomen:  Soft, nontender  Extremities: ++edema, Massive Scrotal edema  Neurologic: Sedated, intubated.   Skin: Mottling over toes and some fingers. Rash over dorsum of feet  Access: Left IJ dialysis cathter (4/2) Dr Alva Garnet    Basic Metabolic Panel:  Recent Labs Lab 09/01/15 0435  09/02/15 0519  09/03/15 0443  09/04/15 0421  09/04/15 1600 09/04/15 2004 09/04/15 2355 09/05/15 0436 09/05/15 0437 09/05/15 0727  NA 135  < > 137  < > 137  < > 137  < > 137 137 137 138  --  138  K 3.9  < > 3.6  < > 3.7  < > 3.7  < > 4.5 4.8 4.7 4.7  --  4.7  CL 102  < > 104  < > 104  < > 106  < > 107 107 108 107  --  107  CO2 29  < > 26  < > 27  < > 27  < > _0 --  24  GLUCOSE 145*  < > 144*  < > 117*  < > 112*  < > 189* 188* 167* 156*  --  190*   BUN 66*  < > 73*  < > 70*  < > 67*  < > 64* 71* 71* 71*  --  74*  CREATININE 3.29*  < > 3.44*  < > 3.28*  < > 3.31*  < > 3.04* 3.47* 3.46* 3.37*  --  3.31*  CALCIUM 7.1*  < > 7.3*  < > 7.2*  < > 7.7*  < > 7.7* 7.7* 7.7* 7.8*  --  8.0*  MG 2.3  --  2.2  --  2.1  --  2.3  --   --   --   --   --  2.5*  --   PHOS 1.9*  --  3.0  --  2.5  --  2.4*  --   --   --   --   --  2.5  --   < > = values in this interval not displayed.  Liver Function Tests:  Recent Labs Lab 08/30/15 1543 08/31/15 0915 08/31/15 1735 09/01/15 0435 09/03/15 0443  AST 296* 495* 497* 442* 287*  ALT 87* 135* 147* 148* 193*  ALKPHOS 34* 37* 38 48 84  BILITOT 0.8 0.9 0.7 0.7 0.9  PROT 3.7* 4.4* 4.0* 4.1* 4.0*  ALBUMIN 2.0* 2.9* 2.3* 2.2* 1.6*    Recent Labs Lab 08/31/15 0915  LIPASE 11  AMYLASE 29   No results for input(s): AMMONIA in the last 168 hours.  CBC:  Recent Labs Lab 08/30/15 1543 08/31/15 0447 09/01/15 0435 09/02/15 0519 09/03/15 0443 09/05/15 0437  WBC 23.4* 18.9* 20.1* 19.5* 19.0* 17.1*  NEUTROABS 21.7* 16.6*  --   --   --   --   HGB 11.9* 11.0* 10.3* 9.5* 9.1* 6.8*  HCT 36.0* 32.5* 30.9* 28.9* 28.0* 21.5*  MCV 89.3 89.0 90.7 91.9 93.0 91.7  PLT 28* 26* 56* 33* 66* 66*    Cardiac Enzymes:  Recent Labs Lab 08/30/15 1543 08/31/15 0915 09/01/15 0435 09/02/15 0519 09/02/15 1419 09/03/15 0443  CKTOTAL 73220* 25427* 06237* 17243*  --  5397*  CKMB 144.6*  --   --   --   --   --   TROPONINI 0.50*  --   --   --  0.32* 0.23*    BNP: Invalid input(s): POCBNP  CBG:  Recent Labs Lab 09/04/15 0014 09/04/15 0840 09/04/15 1616 09/05/15 0018 09/05/15 0735  GLUCAP 122* 104* 171* 165* 175*    Microbiology: Results for orders placed or performed during the hospital encounter of 08/27/15  Rapid Influenza A&B Antigens ([place] only)     Status: None   Collection Time: 08/27/15 12:22 PM  Result Value Ref Range Status   Influenza A ([place]) NEGATIVE NEGATIVE Final   Influenza B ([facility])  NEGATIVE NEGATIVE Final  Urine culture     Status: None   Collection Time: 08/27/15  7:40 PM  Result Value Ref Range Status   Specimen Description URINE, RANDOM  Final   Special Requests Normal  Final   Culture NO GROWTH 1 DAY  Final   Report Status 08/29/2015 FINAL  Final  MRSA PCR Screening     Status: None   Collection Time: 08/28/15  5:25 PM  Result Value Ref Range Status   MRSA by PCR NEGATIVE NEGATIVE Final    Comment:        The GeneXpert MRSA Assay (FDA approved for NASAL specimens only), is one component of a comprehensive MRSA colonization surveillance program. It is not intended to diagnose MRSA infection nor to guide or monitor treatment for MRSA infections.   Culture, blood (Routine X 2) w Reflex to ID Panel     Status: None   Collection Time: 08/28/15  5:51 PM  Result Value Ref Range Status   Specimen Description BLOOD RIGHT HAND  Final   Special Requests BAA,ANA,AER,5ML  Final   Culture NO GROWTH 5 DAYS  Final   Report Status 09/02/2015 FINAL  Final  Culture, blood (Routine X 2) w Reflex to ID Panel     Status: None   Collection Time: 08/28/15  5:51 PM  Result Value Ref Range Status   Specimen Description BLOOD RIGHT ASSIST CONTROL  Final   Special Requests BAA,ANA,AER,5ML  Final   Culture NO GROWTH 5 DAYS  Final   Report Status 09/02/2015 FINAL  Final  Culture, respiratory (NON-Expectorated)     Status: None   Collection Time: 08/29/15  2:16 PM  Result Value Ref Range Status   Specimen Description TRACHEAL ASPIRATE  Final   Special Requests NONE  Final   Gram Stain FEW WBC SEEN FEW YEAST  Final   Culture Consistent with normal respiratory flora.  Final   Report Status 09/01/2015 FINAL  Final  Culture, respiratory (NON-Expectorated)     Status: None   Collection Time: 08/31/15  3:45 PM  Result Value Ref Range Status   Specimen Description TRACHEAL ASPIRATE  Final   Special Requests NONE  Final   Gram Stain   Final    FEW WBC SEEN MANY GRAM  POSITIVE RODS MODERATE GRAM POSITIVE COCCI RARE YEAST    Culture HEAVY GROWTH ENTEROCOCCUS FAECALIS  Final   Report Status 09/03/2015 FINAL  Final   Organism ID, Bacteria ENTEROCOCCUS FAECALIS  Final      Susceptibility   Enterococcus faecalis - MIC*    AMPICILLIN <=2 SENSITIVE Sensitive     VANCOMYCIN 1 SENSITIVE Sensitive     GENTAMICIN SYNERGY SENSITIVE Sensitive     LINEZOLID 2 SENSITIVE Sensitive     * HEAVY GROWTH ENTEROCOCCUS FAECALIS  Culture, blood (Routine X 2) w Reflex to ID Panel     Status: None (Preliminary result)   Collection Time: 09/02/15  3:18 PM  Result Value Ref Range Status   Specimen Description BLOOD LINE  Final   Special Requests   Final    BOTTLES DRAWN AEROBIC AND ANAEROBIC  AER 4CC ANA 1CC   Culture NO GROWTH 3 DAYS  Final   Report Status PENDING  Incomplete  Culture, blood (Routine X 2) w Reflex to ID Panel     Status: None (Preliminary result)   Collection Time: 09/02/15  3:20 PM  Result Value Ref Range Status   Specimen Description BLOOD RIGHT ARM  Final   Special Requests BOTTLES DRAWN AEROBIC AND ANAEROBIC  8CC  Final   Culture NO GROWTH 3 DAYS  Final   Report Status PENDING  Incomplete  Culture, respiratory (NON-Expectorated)     Status: None (Preliminary result)   Collection Time: 09/02/15  4:30 PM  Result Value Ref Range Status   Specimen Description TRACHEAL ASPIRATE  Final   Special Requests NONE  Final   Gram Stain   Final    MODERATE WBC SEEN FEW GRAM POSITIVE COCCI IN PAIRS    Culture   Final    LIGHT GROWTH GRAM NEGATIVE RODS IDENTIFICATION TO FOLLOW ONCE BETTER GROWTH    Report Status PENDING  Incomplete  C difficile quick scan w PCR reflex     Status: None   Collection Time: 09/02/15  4:56 PM  Result Value Ref Range Status   C Diff antigen NEGATIVE NEGATIVE Final   C Diff toxin NEGATIVE NEGATIVE Final   C Diff interpretation Negative for C. difficile  Final    Coagulation Studies: No results for input(s): LABPROT, INR in  the last 72 hours.  Urinalysis: No results for input(s): COLORURINE, LABSPEC, PHURINE, GLUCOSEU, HGBUR, BILIRUBINUR, KETONESUR, PROTEINUR, UROBILINOGEN, NITRITE, LEUKOCYTESUR in the last 72 hours.  Invalid input(s): APPERANCEUR    Imaging: Dg Chest Port 1 View  09/05/2015  CLINICAL DATA:  Respiratory failure. EXAM: PORTABLE CHEST 1 VIEW COMPARISON:  09/03/2015. FINDINGS: Endotracheal tube NG tube left IJ and subclavian lines in stable position. Cardiomegaly patch that stable cardiomegaly. Multifocal bilateral prominent pulmonary infiltrates are again noted without significant interim change. Small left pleural effusion cannot be excluded. No pneumothorax. IMPRESSION: 1. Lines and tubes in stable position. 2. Multifocal prominent pulmonary infiltrates are again noted without significant interim change. Small left pleural effusion. 3. Stable cardiomegaly. Electronically Signed   By: Marcello Moores  Register   On: 09/05/2015 07:16  Medications:   . feeding supplement (VITAL 1.5 CAL) 1,000 mL (09/04/15 1350)  . fentaNYL infusion INTRAVENOUS 350 mcg/hr (09/05/15 0849)  . midazolam (VERSED) infusion 5 mg/hr (09/05/15 0849)  . norepinephrine (LEVOPHED) Adult infusion Stopped (09/03/15 1614)  . pureflow 2,000 mL/hr at 09/05/15 0432   . sodium chloride   Intravenous Once  . sodium chloride   Intravenous Once  . sodium chloride   Intravenous Once  . albumin human  12.5 g Intravenous TID  . ampicillin (OMNIPEN) IV  2 g Intravenous 3 times per day  . antiseptic oral rinse  7 mL Mouth Rinse QID  . chlorhexidine gluconate (SAGE KIT)  15 mL Mouth Rinse BID  . diazepam  5 mg Intravenous 4 times per day  . feeding supplement (PRO-STAT SUGAR FREE 64)  30 mL Oral QID  . free water  30 mL Per Tube 6 times per day  . methylPREDNISolone (SOLU-MEDROL) injection  40 mg Intravenous Q12H  . pantoprazole sodium  40 mg Per Tube Q1200  . senna-docusate  1 tablet Oral BID   acetaminophen **OR** acetaminophen,  albuterol, bisacodyl, fentaNYL, heparin, midazolam, sodium chloride, sodium chloride flush, vecuronium  Assessment/ Plan:  Mr. John Hoover is a 50 y.o. white male with no specific past medical history, who was admitted to [facility] on 08/27/2015 + Ibuprofen use prior to admission (600 mg TID x 2 days)  1. Acute Renal Failure, oliguric. Likely secondary to ATN 2. Metabolic Acidosis 3. Rhabdomyolysis with hyperphosphatemia 4. Acute respiratory failure 5. Influenza A positive 6. Hypoalbuminemia 7. Anasarca 8. Anemia and Thrombocytopenia (hematology evaluation ongoing)  Plan Patient with anuric to oliguric urine renal failure, anasarca, requiring vasopressors  Continue CRRT: CVVHD 4K bath, BFR 250 DFR 2.0 litre. UF 150 cc/hr - q 4 hour BMP.  - Added low dose iv albumin for oncotic support (4/3) - will follow    LOS: 9 Dona Walby 4/5/201710:01 AM

## 2015-09-05 NOTE — Progress Notes (Signed)
RN spoke with Dr. Belia HemanKasa and made MD aware that patient's vascath clotted at venous access and that activase is needed and RN also made MD aware that patient's blood pressure is more elevated this afternoon 165/89.  MD acknowledged and gave order for activase for dialysis catheter and gave no orders regarding blood pressure at this time.

## 2015-09-05 NOTE — Progress Notes (Signed)
CRRT restarted after activase was packed into dialysis catheter(allowed to instill for 1 hour and removed).

## 2015-09-05 NOTE — Progress Notes (Signed)
Arterial line now has cath flo instilled.

## 2015-09-05 NOTE — Progress Notes (Signed)
MEDICATION RELATED CONSULT NOTE - FOLLOW UP   Pharmacy Consult for CRRT Dosing/Constipation Preventino   No Known Allergies  Patient Measurements: Height: 6' 1" (185.4 cm) Weight: 254 lb 10.1 oz (115.5 kg) IBW/kg (Calculated) : 79.9  Vital Signs: Temp: 99.3 F (37.4 C) (04/05 1600) Temp Source: Rectal (04/05 1445) Pulse Rate: 103 (04/05 1600) Intake/Output from previous day: 04/04 0701 - 04/05 0700 In: 2732 [I.V.:1042; NG/GT:1440; IV Piggyback:250] Out: 2384 [Urine:45] Intake/Output from this shift: Total I/O In: 1414 [I.V.:404; Other:310; NG/GT:550; IV Piggyback:150] Out: 747 [Urine:15; Other:732]  Labs:  Recent Labs  09/03/15 0443  09/04/15 0421  09/04/15 2355 09/05/15 0436 09/05/15 0437 09/05/15 0727  WBC 19.0*  --   --   --   --   --  17.1*  --   HGB 9.1*  --   --   --   --   --  6.8*  --   HCT 28.0*  --   --   --   --   --  21.5*  --   PLT 66*  --   --   --   --   --  66*  --   CREATININE 3.28*  < > 3.31*  < > 3.46* 3.37*  --  3.31*  MG 2.1  --  2.3  --   --   --  2.5*  --   PHOS 2.5  --  2.4*  --   --   --  2.5  --   ALBUMIN 1.6*  --   --   --   --   --   --   --   PROT 4.0*  --   --   --   --   --   --   --   AST 287*  --   --   --   --   --   --   --   ALT 193*  --   --   --   --   --   --   --   ALKPHOS 84  --   --   --   --   --   --   --   BILITOT 0.9  --   --   --   --   --   --   --   < > = values in this interval not displayed. Estimated Creatinine Clearance: 35.1 mL/min (by C-G formula based on Cr of 3.31).   Microbiology: Recent Results (from the past 720 hour(s))  Rapid Influenza A&B Antigens ([place] only)     Status: None   Collection Time: 08/27/15 12:22 PM  Result Value Ref Range Status   Influenza A ([place]) NEGATIVE NEGATIVE Final   Influenza B ([facility]) NEGATIVE NEGATIVE Final  Urine culture     Status: None   Collection Time: 08/27/15  7:40 PM  Result Value Ref Range Status   Specimen Description URINE, RANDOM  Final   Special Requests  Normal  Final   Culture NO GROWTH 1 DAY  Final   Report Status 08/29/2015 FINAL  Final  MRSA PCR Screening     Status: None   Collection Time: 08/28/15  5:25 PM  Result Value Ref Range Status   MRSA by PCR NEGATIVE NEGATIVE Final    Comment:        The GeneXpert MRSA Assay (FDA approved for NASAL specimens only), is one component of a comprehensive MRSA colonization surveillance program. It is not intended  to diagnose MRSA infection nor to guide or monitor treatment for MRSA infections.   Culture, blood (Routine X 2) w Reflex to ID Panel     Status: None   Collection Time: 08/28/15  5:51 PM  Result Value Ref Range Status   Specimen Description BLOOD RIGHT HAND  Final   Special Requests BAA,ANA,AER,5ML  Final   Culture NO GROWTH 5 DAYS  Final   Report Status 09/02/2015 FINAL  Final  Culture, blood (Routine X 2) w Reflex to ID Panel     Status: None   Collection Time: 08/28/15  5:51 PM  Result Value Ref Range Status   Specimen Description BLOOD RIGHT ASSIST CONTROL  Final   Special Requests BAA,ANA,AER,5ML  Final   Culture NO GROWTH 5 DAYS  Final   Report Status 09/02/2015 FINAL  Final  Culture, respiratory (NON-Expectorated)     Status: None   Collection Time: 08/29/15  2:16 PM  Result Value Ref Range Status   Specimen Description TRACHEAL ASPIRATE  Final   Special Requests NONE  Final   Gram Stain FEW WBC SEEN FEW YEAST   Final   Culture Consistent with normal respiratory flora.  Final   Report Status 09/01/2015 FINAL  Final  Culture, respiratory (NON-Expectorated)     Status: None   Collection Time: 08/31/15  3:45 PM  Result Value Ref Range Status   Specimen Description TRACHEAL ASPIRATE  Final   Special Requests NONE  Final   Gram Stain   Final    FEW WBC SEEN MANY GRAM POSITIVE RODS MODERATE GRAM POSITIVE COCCI RARE YEAST    Culture HEAVY GROWTH ENTEROCOCCUS FAECALIS  Final   Report Status 09/03/2015 FINAL  Final   Organism ID, Bacteria ENTEROCOCCUS FAECALIS   Final      Susceptibility   Enterococcus faecalis - MIC*    AMPICILLIN <=2 SENSITIVE Sensitive     VANCOMYCIN 1 SENSITIVE Sensitive     GENTAMICIN SYNERGY SENSITIVE Sensitive     LINEZOLID 2 SENSITIVE Sensitive     * HEAVY GROWTH ENTEROCOCCUS FAECALIS  Culture, blood (Routine X 2) w Reflex to ID Panel     Status: None (Preliminary result)   Collection Time: 09/02/15  3:18 PM  Result Value Ref Range Status   Specimen Description BLOOD LINE  Final   Special Requests   Final    BOTTLES DRAWN AEROBIC AND ANAEROBIC  AER 4CC ANA 1CC   Culture NO GROWTH 3 DAYS  Final   Report Status PENDING  Incomplete  Culture, blood (Routine X 2) w Reflex to ID Panel     Status: None (Preliminary result)   Collection Time: 09/02/15  3:20 PM  Result Value Ref Range Status   Specimen Description BLOOD RIGHT ARM  Final   Special Requests BOTTLES DRAWN AEROBIC AND ANAEROBIC  8CC  Final   Culture NO GROWTH 3 DAYS  Final   Report Status PENDING  Incomplete  Culture, respiratory (NON-Expectorated)     Status: None (Preliminary result)   Collection Time: 09/02/15  4:30 PM  Result Value Ref Range Status   Specimen Description TRACHEAL ASPIRATE  Final   Special Requests NONE  Final   Gram Stain   Final    MODERATE WBC SEEN FEW GRAM POSITIVE COCCI IN PAIRS    Culture   Final    LIGHT GROWTH GRAM NEGATIVE RODS IDENTIFICATION AND SUSCEPTIBILITIES TO FOLLOW    Report Status PENDING  Incomplete  C difficile quick scan w PCR reflex  Status: None   Collection Time: 09/02/15  4:56 PM  Result Value Ref Range Status   C Diff antigen NEGATIVE NEGATIVE Final   C Diff toxin NEGATIVE NEGATIVE Final   C Diff interpretation Negative for C. difficile  Final    Medical History: Past Medical History  Diagnosis Date  . Meniere's disease     Medications:  Scheduled:  . sodium chloride   Intravenous Once  . sodium chloride   Intravenous Once  . albumin human  12.5 g Intravenous TID  . antiseptic oral rinse  7  mL Mouth Rinse QID  . chlorhexidine gluconate (SAGE KIT)  15 mL Mouth Rinse BID  . diazepam  5 mg Intravenous 4 times per day  . feeding supplement (PRO-STAT SUGAR FREE 64)  30 mL Oral QID  . free water  30 mL Per Tube 6 times per day  . methylPREDNISolone (SOLU-MEDROL) injection  40 mg Intravenous Q12H  . pantoprazole sodium  40 mg Per Tube Q1200  . piperacillin-tazobactam (ZOSYN)  IV  3.375 g Intravenous 3 times per day  . senna-docusate  1 tablet Oral BID   Infusions:  . feeding supplement (VITAL 1.5 CAL) 1,000 mL (09/05/15 1213)  . fentaNYL infusion INTRAVENOUS 400 mcg/hr (09/05/15 1423)  . midazolam (VERSED) infusion 5 mg/hr (09/05/15 0849)  . norepinephrine (LEVOPHED) Adult infusion Stopped (09/03/15 1614)  . pureflow 3 each (09/05/15 1320)    Assessment: Pharmacy consulted to assist in adjusting medications for CRRT and for constipation prevention in this 50 y/o M with rhabdomyolysis.   Plan:  1. No further adjustments are necessary at this time.  2. Patient with BM 4/5. Will continue senna-docusate 1 tab po PT bid.   Pharmacy will continue to monitor and adjust per consult.   Ulice Dash D 09/05/2015,4:14 PM

## 2015-09-05 NOTE — Progress Notes (Signed)
Nutrition Follow-up  DOCUMENTATION CODES:   Not applicable  INTERVENTION:   EN: based on nutritional reassessment, recommend increasing TF to rate of 55 ml/hr, continue Prostat QID; meets 100% estimated protein and calorie needs  NUTRITION DIAGNOSIS:   Inadequate oral intake related to acute illness as evidenced by NPO status.  GOAL:   Provide needs based on ASPEN/SCCM guidelines  MONITOR:   TF tolerance, Vent status, Labs, I & O's, Weight trends  REASON FOR ASSESSMENT:   Ventilator    ASSESSMENT:    Pt remains on vent, on CRRT  Patient is currently intubated on ventilator support MV: 15 L/min Temp (24hrs), Avg:98.5 F (36.9 C), Min:97.3 F (36.3 C), Max:99.5 F (37.5 C)  Diet Order:    NPO  EN: tolerating Vital 1.5 at rate of 50 ml/hr  Skin:  Reviewed, no issues  Last BM:  09/05/15    Recent Labs Lab 09/03/15 0443  09/04/15 0421  09/04/15 2355 09/05/15 0436 09/05/15 0437 09/05/15 0727  NA 137  < > 137  < > 137 138  --  138  K 3.7  < > 3.7  < > 4.7 4.7  --  4.7  CL 104  < > 106  < > 108 107  --  107  CO2 27  < > 27  < > 24 25  --  24  BUN 70*  < > 67*  < > 71* 71*  --  74*  CREATININE 3.28*  < > 3.31*  < > 3.46* 3.37*  --  3.31*  CALCIUM 7.2*  < > 7.7*  < > 7.7* 7.8*  --  8.0*  MG 2.1  --  2.3  --   --   --  2.5*  --   PHOS 2.5  --  2.4*  --   --   --  2.5  --   GLUCOSE 117*  < > 112*  < > 167* 156*  --  190*  < > = values in this interval not displayed.  Meds: solumedrol  Height:   Ht Readings from Last 1 Encounters:  08/28/15 6\' 1"  (1.854 m)    Weight:   Wt Readings from Last 1 Encounters:  09/04/15 254 lb 10.1 oz (115.5 kg)    Filed Weights   09/02/15 0500 09/03/15 0439 09/04/15 0440  Weight: 249 lb 9 oz (113.2 kg) 255 lb 15.3 oz (116.1 kg) 254 lb 10.1 oz (115.5 kg)    BMI:  Body mass index is 33.6 kg/(m^2).  Estimated Nutritional Needs:   Kcal:  2442 kcals (Ve: 15, Tmax: 37.5)   Protein:  148-197 g (1.5-2.0 g/kg)   Fluid:   2L per day  EDUCATION NEEDS:   Education needs no appropriate at this time  Romelle StarcherCate Brave Dack MS, RD, LDN [phone] Pager  [phone] Weekend/On-Call Pager

## 2015-09-05 NOTE — Progress Notes (Signed)
eLink Physician-Brief Progress Note Patient Name: Peterson AoWilliam E Kervin DOB: December 11, 1965 MRN: [id number]   Date of Service  09/05/2015  HPI/Events of Note  Wife expressing concerns  eICU Interventions  D/w Drinda Buttsnnette - she is concerned about lack of progress Explained course of multiorgan failure Am team to discuss & decide re: transfer      Intervention Category Intermediate Interventions: Communication with other healthcare providers and/or family  Cortnie Ringel V. 09/05/2015, 7:23 PM

## 2015-09-06 LAB — BLOOD GAS, ARTERIAL
ACID-BASE EXCESS: 0.6 mmol/L (ref 0.0–3.0)
ALLENS TEST (PASS/FAIL): POSITIVE — AB
Bicarbonate: 26 mEq/L (ref 21.0–28.0)
FIO2: 0.4
LHR: 25 {breaths}/min
O2 SAT: 93.5 %
PCO2 ART: 45 mmHg (ref 32.0–48.0)
PEEP: 5 cmH2O
Patient temperature: 37
VT: 600 mL
pH, Arterial: 7.37 (ref 7.350–7.450)
pO2, Arterial: 71 mmHg — ABNORMAL LOW (ref 83.0–108.0)

## 2015-09-06 LAB — COMPREHENSIVE METABOLIC PANEL
ALK PHOS: 98 U/L (ref 38–126)
ALT: 58 U/L (ref 17–63)
AST: 104 U/L — AB (ref 15–41)
Albumin: 2.6 g/dL — ABNORMAL LOW (ref 3.5–5.0)
Anion gap: 4 — ABNORMAL LOW (ref 5–15)
BILIRUBIN TOTAL: 0.5 mg/dL (ref 0.3–1.2)
BUN: 89 mg/dL — AB (ref 6–20)
CALCIUM: 8 mg/dL — AB (ref 8.9–10.3)
CO2: 25 mmol/L (ref 22–32)
CREATININE: 3.31 mg/dL — AB (ref 0.61–1.24)
Chloride: 107 mmol/L (ref 101–111)
GFR calc Af Amer: 23 mL/min — ABNORMAL LOW (ref >60)
GFR, EST NON AFRICAN AMERICAN: 20 mL/min — AB (ref >60)
Glucose, Bld: 175 mg/dL — ABNORMAL HIGH (ref 65–99)
POTASSIUM: 5 mmol/L (ref 3.5–5.1)
Sodium: 136 mmol/L (ref 135–145)
TOTAL PROTEIN: 5.1 g/dL — AB (ref 6.5–8.1)

## 2015-09-06 LAB — CBC WITH DIFFERENTIAL/PLATELET
BASOS ABS: 0 10*3/uL (ref 0–0.1)
EOS ABS: 0 10*3/uL (ref 0–0.7)
Eosinophils Relative: 0 %
HCT: 23.5 % — ABNORMAL LOW (ref 40.0–52.0)
HEMOGLOBIN: 7.7 g/dL — AB (ref 13.0–18.0)
Lymphocytes Relative: 4 %
Lymphs Abs: 0.7 10*3/uL — ABNORMAL LOW (ref 1.0–3.6)
MCH: 30.4 pg (ref 26.0–34.0)
MCHC: 32.8 g/dL (ref 32.0–36.0)
MCV: 92.9 fL (ref 80.0–100.0)
Monocytes Absolute: 1.3 10*3/uL — ABNORMAL HIGH (ref 0.2–1.0)
Neutro Abs: 16.2 10*3/uL — ABNORMAL HIGH (ref 1.4–6.5)
Platelets: 69 10*3/uL — ABNORMAL LOW (ref 150–440)
RBC: 2.53 MIL/uL — ABNORMAL LOW (ref 4.40–5.90)
RDW: 15.7 % — AB (ref 11.5–14.5)
WBC: 18.3 10*3/uL — AB (ref 3.8–10.6)

## 2015-09-06 LAB — BASIC METABOLIC PANEL
ANION GAP: 4 — AB (ref 5–15)
Anion gap: 3 — ABNORMAL LOW (ref 5–15)
BUN: 82 mg/dL — AB (ref 6–20)
BUN: 86 mg/dL — ABNORMAL HIGH (ref 6–20)
CHLORIDE: 108 mmol/L (ref 101–111)
CHLORIDE: 108 mmol/L (ref 101–111)
CO2: 25 mmol/L (ref 22–32)
CO2: 25 mmol/L (ref 22–32)
Calcium: 7.7 mg/dL — ABNORMAL LOW (ref 8.9–10.3)
Calcium: 8 mg/dL — ABNORMAL LOW (ref 8.9–10.3)
Creatinine, Ser: 3.52 mg/dL — ABNORMAL HIGH (ref 0.61–1.24)
Creatinine, Ser: 3.35 mg/dL — ABNORMAL HIGH (ref 0.61–1.24)
GFR calc non Af Amer: 20 mL/min — ABNORMAL LOW (ref >60)
GFR, EST AFRICAN AMERICAN: 22 mL/min — AB (ref >60)
GFR, EST AFRICAN AMERICAN: 23 mL/min — AB (ref >60)
GFR, EST NON AFRICAN AMERICAN: 19 mL/min — AB (ref >60)
Glucose, Bld: 163 mg/dL — ABNORMAL HIGH (ref 65–99)
Glucose, Bld: 182 mg/dL — ABNORMAL HIGH (ref 65–99)
POTASSIUM: 5 mmol/L (ref 3.5–5.1)
POTASSIUM: 4.9 mmol/L (ref 3.5–5.1)
SODIUM: 137 mmol/L (ref 135–145)
SODIUM: 136 mmol/L (ref 135–145)

## 2015-09-06 LAB — GLUCOSE, CAPILLARY
GLUCOSE-CAPILLARY: 150 mg/dL — AB (ref 65–99)
GLUCOSE-CAPILLARY: 154 mg/dL — AB (ref 65–99)
GLUCOSE-CAPILLARY: 156 mg/dL — AB (ref 65–99)
Glucose-Capillary: 134 mg/dL — ABNORMAL HIGH (ref 65–99)

## 2015-09-06 LAB — CULTURE, RESPIRATORY W GRAM STAIN

## 2015-09-06 LAB — TYPE AND SCREEN
ABO/RH(D): O POS
ANTIBODY SCREEN: NEGATIVE
UNIT DIVISION: 0

## 2015-09-06 LAB — PHOSPHORUS: PHOSPHORUS: 3.4 mg/dL (ref 2.5–4.6)

## 2015-09-06 LAB — CULTURE, RESPIRATORY

## 2015-09-06 LAB — MAGNESIUM: MAGNESIUM: 2.6 mg/dL — AB (ref 1.7–2.4)

## 2015-09-06 MED ORDER — BUDESONIDE 0.5 MG/2ML IN SUSP
0.5000 mg | Freq: Two times a day (BID) | RESPIRATORY_TRACT | Status: DC
  Administered 2015-09-06 – 2015-09-10 (×9): 0.5 mg via RESPIRATORY_TRACT
  Filled 2015-09-06 (×10): qty 2

## 2015-09-06 MED ORDER — STERILE WATER FOR INJECTION IJ SOLN
INTRAMUSCULAR | Status: AC
  Administered 2015-09-06: 10 mL
  Filled 2015-09-06: qty 10

## 2015-09-06 MED ORDER — IPRATROPIUM-ALBUTEROL 0.5-2.5 (3) MG/3ML IN SOLN
3.0000 mL | RESPIRATORY_TRACT | Status: DC
  Administered 2015-09-06 – 2015-09-10 (×24): 3 mL via RESPIRATORY_TRACT
  Filled 2015-09-06 (×23): qty 3

## 2015-09-06 MED ORDER — SULFAMETHOXAZOLE-TRIMETHOPRIM 400-80 MG/5ML IV SOLN
940.0000 mg | Freq: Once | INTRAVENOUS | Status: AC
  Administered 2015-09-06: 940 mg via INTRAVENOUS
  Filled 2015-09-06: qty 58.8

## 2015-09-06 MED ORDER — SODIUM CHLORIDE 0.9 % IV SOLN
2.0000 mg/h | INTRAVENOUS | Status: DC
  Administered 2015-09-06: 1 mg/h via INTRAVENOUS
  Filled 2015-09-06 (×3): qty 20

## 2015-09-06 MED ORDER — HYDRALAZINE HCL 20 MG/ML IJ SOLN: 10.0000 mg | INTRAMUSCULAR | Status: DC | PRN

## 2015-09-06 NOTE — Progress Notes (Signed)
Pre HD Tx Assessment 

## 2015-09-06 NOTE — Progress Notes (Signed)
Patient completed hemodialysis with no issue, 3L fluid removed per dialysis RN.  Patient continues to be intubated and sedated with versed gtt continues at 8mg /hr and  dilaudid gtt at 0.425ml/hr.  Vital signs stable, sinus rhythm on cardiac monitor. Foley in place with 25ml of urine.  Tube feeding at goal, tolerating well.  Rectal tube in place with 300ml.   Patient currently sedated comfortably in no apparent distress.  Report giving to night RN, Edentonhristi with no further questions.

## 2015-09-06 NOTE — Progress Notes (Signed)
[facility]  ROUNDING NOTE   Subjective:  Patient remains critically ill.   CRRT continued.  Cathter malfunction last night, Cathflo used.  UOP remains poor Off of all pressors, BP is better Total UF of ~ 1.7  L last 24 hrs  Objective:  Vital signs in last 24 hours:  Temp:  [97.7 F (36.5 C)-99.3 F (37.4 C)] 97.9 F (36.6 C) (04/06 0600) Pulse Rate:  [83-107] 96 (04/06 0600) Resp:  [13-34] 14 (04/06 0600) SpO2:  [94 %-100 %] 98 % (04/06 0600) Arterial Line BP: (99-170)/(60-91) 164/87 mmHg (04/06 0600) FiO2 (%):  [35 %] 35 % (04/06 0735) Weight:  [114.1 kg (251 lb 8.7 oz)-115.1 kg (253 lb 12 oz)] 115.1 kg (253 lb 12 oz) (04/06 0500)  Weight change:  Filed Weights   09/04/15 0440 09/05/15 1757 09/06/15 0500  Weight: 115.5 kg (254 lb 10.1 oz) 114.1 kg (251 lb 8.7 oz) 115.1 kg (253 lb 12 oz)    Intake/Output: I/O last 3 completed shifts: In: 4237.1 [I.V.:1557.1; Other:310; NG/GT:2070; IV Piggyback:300] Out: 2811 [Urine:70; Other:2741]   Intake/Output this shift:     Physical Exam: General: Critically ill   Head: +ETT +OGT  Eyes: Eyes closed,    Neck:  trachea midline, left subclavian triple lumen  Lungs:  Clear to auscultation, FiO2 35%, PEEP 5  Heart: regular  Abdomen:  Soft, nontender  Extremities: ++edema, Massive Scrotal edema  Neurologic: Sedated, intubated.   Skin: Mottling over toes and fingers of rt hand. Rash over dorsum of feet- improving  Access: Left IJ dialysis cathter (4/2) Dr Alva Garnet    Basic Metabolic Panel:  Recent Labs Lab 09/02/15 0519  09/03/15 0443  09/04/15 0421  09/05/15 4098 09/05/15 1191 09/05/15 0727 09/05/15 1528 09/05/15 2151 09/06/15 0302 09/06/15 0503  NA 137  < > 137  < > 137  < > 138  --  138 135 134* 137  --   K 3.6  < > 3.7  < > 3.7  < > 4.7  --  4.7 4.8 5.0 5.0  --   CL 104  < > 104  < > 106  < > 107  --  107 105 105 108  --   CO2 26  < > 27  < > 27  < > 25  --  24 24 23 25   --   GLUCOSE 144*  < > 117*   < > 112*  < > 156*  --  190* 167* 165* 163*  --   BUN 73*  < > 70*  < > 67*  < > 71*  --  74* 72* 82* 82*  --   CREATININE 3.44*  < > 3.28*  < > 3.31*  < > 3.37*  --  3.31* 3.37* 3.78* 3.52*  --   CALCIUM 7.3*  < > 7.2*  < > 7.7*  < > 7.8*  --  8.0* 8.0* 7.4* 7.7*  --   MG 2.2  --  2.1  --  2.3  --   --  2.5*  --   --   --   --  2.6*  PHOS 3.0  --  2.5  --  2.4*  --   --  2.5  --   --   --   --  3.4  < > = values in this interval not displayed.  Liver Function Tests:  Recent Labs Lab 08/30/15 1543 08/31/15 0915 08/31/15 1735 09/01/15 0435 09/03/15 0443  AST 296*  495* 497* 442* 287*  ALT 87* 135* 147* 148* 193*  ALKPHOS 34* 37* 38 48 84  BILITOT 0.8 0.9 0.7 0.7 0.9  PROT 3.7* 4.4* 4.0* 4.1* 4.0*  ALBUMIN 2.0* 2.9* 2.3* 2.2* 1.6*    Recent Labs Lab 08/31/15 0915  LIPASE 11  AMYLASE 29   No results for input(s): AMMONIA in the last 168 hours.  CBC:  Recent Labs Lab 08/30/15 1543 08/31/15 0447 09/01/15 0435 09/02/15 0519 09/03/15 0443 09/05/15 0437 09/06/15 0503  WBC 23.4* 18.9* 20.1* 19.5* 19.0* 17.1* 18.3*  NEUTROABS 21.7* 16.6*  --   --   --   --  16.2*  HGB 11.9* 11.0* 10.3* 9.5* 9.1* 6.8* 7.7*  HCT 36.0* 32.5* 30.9* 28.9* 28.0* 21.5* 23.5*  MCV 89.3 89.0 90.7 91.9 93.0 91.7 92.9  PLT 28* 26* 56* 33* 66* 66* 69*    Cardiac Enzymes:  Recent Labs Lab 08/30/15 1543 08/31/15 0915 09/01/15 0435 09/02/15 0519 09/02/15 1419 09/03/15 0443 09/05/15 0727  CKTOTAL 65465* 03546* 56812* 75170*  --  5397* 2906*  CKMB 144.6*  --   --   --   --   --   --   TROPONINI 0.50*  --   --   --  0.32* 0.23*  --     BNP: Invalid input(s): POCBNP  CBG:  Recent Labs Lab 09/05/15 0735 09/05/15 1138 09/05/15 1623 09/05/15 2324 09/06/15 0746  GLUCAP 175* 145* 153* 138* 154*    Microbiology: Results for orders placed or performed during the hospital encounter of 08/27/15  Rapid Influenza A&B Antigens ([place] only)     Status: None   Collection Time: 08/27/15 12:22  PM  Result Value Ref Range Status   Influenza A ([place]) NEGATIVE NEGATIVE Final   Influenza B ([facility]) NEGATIVE NEGATIVE Final  Urine culture     Status: None   Collection Time: 08/27/15  7:40 PM  Result Value Ref Range Status   Specimen Description URINE, RANDOM  Final   Special Requests Normal  Final   Culture NO GROWTH 1 DAY  Final   Report Status 08/29/2015 FINAL  Final  MRSA PCR Screening     Status: None   Collection Time: 08/28/15  5:25 PM  Result Value Ref Range Status   MRSA by PCR NEGATIVE NEGATIVE Final    Comment:        The GeneXpert MRSA Assay (FDA approved for NASAL specimens only), is one component of a comprehensive MRSA colonization surveillance program. It is not intended to diagnose MRSA infection nor to guide or monitor treatment for MRSA infections.   Culture, blood (Routine X 2) w Reflex to ID Panel     Status: None   Collection Time: 08/28/15  5:51 PM  Result Value Ref Range Status   Specimen Description BLOOD RIGHT HAND  Final   Special Requests BAA,ANA,AER,5ML  Final   Culture NO GROWTH 5 DAYS  Final   Report Status 09/02/2015 FINAL  Final  Culture, blood (Routine X 2) w Reflex to ID Panel     Status: None   Collection Time: 08/28/15  5:51 PM  Result Value Ref Range Status   Specimen Description BLOOD RIGHT ASSIST CONTROL  Final   Special Requests BAA,ANA,AER,5ML  Final   Culture NO GROWTH 5 DAYS  Final   Report Status 09/02/2015 FINAL  Final  Culture, respiratory (NON-Expectorated)     Status: None   Collection Time: 08/29/15  2:16 PM  Result Value Ref Range Status   Specimen  Description TRACHEAL ASPIRATE  Final   Special Requests NONE  Final   Gram Stain FEW WBC SEEN FEW YEAST   Final   Culture Consistent with normal respiratory flora.  Final   Report Status 09/01/2015 FINAL  Final  Culture, respiratory (NON-Expectorated)     Status: None   Collection Time: 08/31/15  3:45 PM  Result Value Ref Range Status   Specimen Description TRACHEAL  ASPIRATE  Final   Special Requests NONE  Final   Gram Stain   Final    FEW WBC SEEN MANY GRAM POSITIVE RODS MODERATE GRAM POSITIVE COCCI RARE YEAST    Culture HEAVY GROWTH ENTEROCOCCUS FAECALIS  Final   Report Status 09/03/2015 FINAL  Final   Organism ID, Bacteria ENTEROCOCCUS FAECALIS  Final      Susceptibility   Enterococcus faecalis - MIC*    AMPICILLIN <=2 SENSITIVE Sensitive     VANCOMYCIN 1 SENSITIVE Sensitive     GENTAMICIN SYNERGY SENSITIVE Sensitive     LINEZOLID 2 SENSITIVE Sensitive     * HEAVY GROWTH ENTEROCOCCUS FAECALIS  Culture, blood (Routine X 2) w Reflex to ID Panel     Status: None (Preliminary result)   Collection Time: 09/02/15  3:18 PM  Result Value Ref Range Status   Specimen Description BLOOD LINE  Final   Special Requests   Final    BOTTLES DRAWN AEROBIC AND ANAEROBIC  AER 4CC ANA 1CC   Culture NO GROWTH 3 DAYS  Final   Report Status PENDING  Incomplete  Culture, blood (Routine X 2) w Reflex to ID Panel     Status: None (Preliminary result)   Collection Time: 09/02/15  3:20 PM  Result Value Ref Range Status   Specimen Description BLOOD RIGHT ARM  Final   Special Requests BOTTLES DRAWN AEROBIC AND ANAEROBIC  8CC  Final   Culture NO GROWTH 3 DAYS  Final   Report Status PENDING  Incomplete  Culture, respiratory (NON-Expectorated)     Status: None (Preliminary result)   Collection Time: 09/02/15  4:30 PM  Result Value Ref Range Status   Specimen Description TRACHEAL ASPIRATE  Final   Special Requests NONE  Final   Gram Stain   Final    MODERATE WBC SEEN FEW GRAM POSITIVE COCCI IN PAIRS    Culture   Final    LIGHT GROWTH GRAM NEGATIVE RODS IDENTIFICATION AND SUSCEPTIBILITIES TO FOLLOW    Report Status PENDING  Incomplete  C difficile quick scan w PCR reflex     Status: None   Collection Time: 09/02/15  4:56 PM  Result Value Ref Range Status   C Diff antigen NEGATIVE NEGATIVE Final   C Diff toxin NEGATIVE NEGATIVE Final   C Diff interpretation  Negative for C. difficile  Final    Coagulation Studies: No results for input(s): LABPROT, INR in the last 72 hours.  Urinalysis: No results for input(s): COLORURINE, LABSPEC, PHURINE, GLUCOSEU, HGBUR, BILIRUBINUR, KETONESUR, PROTEINUR, UROBILINOGEN, NITRITE, LEUKOCYTESUR in the last 72 hours.  Invalid input(s): APPERANCEUR    Imaging: Dg Chest Port 1 View  09/05/2015  CLINICAL DATA:  Respiratory failure. EXAM: PORTABLE CHEST 1 VIEW COMPARISON:  09/03/2015. FINDINGS: Endotracheal tube NG tube left IJ and subclavian lines in stable position. Cardiomegaly patch that stable cardiomegaly. Multifocal bilateral prominent pulmonary infiltrates are again noted without significant interim change. Small left pleural effusion cannot be excluded. No pneumothorax. IMPRESSION: 1. Lines and tubes in stable position. 2. Multifocal prominent pulmonary infiltrates are again noted without significant  interim change. Small left pleural effusion. 3. Stable cardiomegaly. Electronically Signed   By: Marcello Moores  Register   On: 09/05/2015 07:16     Medications:   . feeding supplement (VITAL 1.5 CAL) 1,000 mL (09/06/15 0414)  . fentaNYL infusion INTRAVENOUS 400 mcg/hr (09/06/15 0754)  . midazolam (VERSED) infusion 7 mg/hr (09/06/15 0604)  . norepinephrine (LEVOPHED) Adult infusion Stopped (09/03/15 1614)  . pureflow 2,000 mL/hr at 09/06/15 0349   . sodium chloride   Intravenous Once  . sodium chloride   Intravenous Once  . albumin human  12.5 g Intravenous TID  . alteplase  2 mg Intracatheter Once  . antiseptic oral rinse  7 mL Mouth Rinse QID  . chlorhexidine gluconate (SAGE KIT)  15 mL Mouth Rinse BID  . diazepam  5 mg Intravenous 4 times per day  . feeding supplement (PRO-STAT SUGAR FREE 64)  30 mL Oral QID  . free water  30 mL Per Tube 6 times per day  . methylPREDNISolone (SOLU-MEDROL) injection  40 mg Intravenous Q12H  . pantoprazole sodium  40 mg Per Tube Q1200  . piperacillin-tazobactam (ZOSYN)  IV   3.375 g Intravenous 3 times per day  . senna-docusate  1 tablet Oral BID   acetaminophen **OR** acetaminophen, albuterol, bisacodyl, fentaNYL, heparin, midazolam, sodium chloride, sodium chloride flush, vecuronium  Assessment/ Plan:  Mr. John Hoover is a 50 y.o. white male with no specific past medical history, who was admitted to [facility] on 08/27/2015 + Ibuprofen use prior to admission (600 mg TID x 2 days)  1. Acute Renal Failure, oliguric. Likely secondary to ATN 2. Metabolic Acidosis 3. Rhabdomyolysis with hyperphosphatemia 4. Acute respiratory failure, E faecalis (3/31) 5. Influenza A positive 6. Hypoalbuminemia 7. Anasarca 8. Anemia and Thrombocytopenia (hematology evaluation ongoing)  Plan Patient with anuric to oliguric urine renal failure, anasarca,    d/c albumin now that BP is better Will convert to daily IHD for volume control Family updated   LOS: 10 Syeda Prickett 4/6/20178:52 AM

## 2015-09-06 NOTE — Progress Notes (Signed)
HD Tx Initiation 

## 2015-09-06 NOTE — Progress Notes (Signed)
CRRT clotted off again at 5:45am.  Cartridge replaced and CRRT restarted at 6:23.  Venous and effluent pressures on the high side.

## 2015-09-06 NOTE — Progress Notes (Signed)
CRRT restarted

## 2015-09-06 NOTE — Discharge Summary (Signed)
Name:John Hoover EAV:[id number] DOB:June 07, 1965   ADMISSION DATE: 08/27/2015     PT PROFILE: Young previously healthy 86 M admitted to gen med floor with several days of malaise and profound myalgias. Influenza PCR positive. On admission was noted to be hemoconcentrated with AKI and elevated CPK. On 03/28 developed progressive AMS and found to have profound metabolic acidosis. Transferred to ICU and PCCM consulted for management of acidosis, rhabdomyolysis, MODS, shock. Intubated, CVL placed and HD cath placed on arrival to ICU. Empiric abx initiated  MAJOR EVENTS/TEST RESULTS:HOSPITAL COARSE 03/27 Admitted as above 03/28 transferred to ICU, intubated, CVL placed, HD cath placed, vasopressors initiated, Renal consult, CRRT initiated, HCO3 gtt initiated, empiric abx initated 03/28 CT head: NAD 03/28 Echocardiogram: LVEF 60-65%, grade one diastolic dysfunction 03/28 CTAP: no acute findings 03/30 BLE venous US: no DVT 03/31 Acidosis much improved. CRRT stopped, HCO3 gtt stopped 03/31 Transition off midaz infusion to dexmedetomidine 04/01 CRRT resumed. Back on low dose fentanyl infusion. Failed SBT. Trial furosemide with minimal response 04/02 Increasing agitation. Dexmedetomidine transitioned to propofol. R femoral HD cath not functioning and replaced after platelet transfusion. CXR pattern c/w edema, severe LE and scrotal edema - volume removal via CRRT 04/02 Worsening gas exchange requiring increased vent support and heavier sedation. HD cath required replacement. Platelets transfused prior to placement. Wife apprised several times over course of day 4/3 remains on CRRT very low UO 4/5 patient failed SAT/SBT due to resp muscle fatigue 4/6 family reques transfer to [facility] DEVICES:: ETT 03/28 >>  L [place] CVL 03/28 >>  R femoral HD cath 03/28 >> 04/02 R femoral A-line 03/28 >>  R IJ HD cath 04/02 >>   MICRO DATA: Flu PCR 03/27 >> positive for A, neg for  H1N1 MRSA PCR 03/28 >> NEG Urine 03/27 >> NEG Blood 03/28 >> NEG Resp 03/29 >> NOF HIV 03/31 >> NEG Resp 03/31 >>enterococcus faecalis C diff 04/02 >> NEG Resp 04/02 >>    ANTIMICROBIALS:  Oseltamivir 03/27 >> 04/01 Vanc 03/28 >> 03/31 Cefepime 03/28 >>     VITAL SIGNS: BP 104/73 mmHg  Pulse 96  Temp(Src) 97.9 F (36.6 C) (Axillary)  Resp 14  Ht  (1.854 m)  Wt 253 lb 12 oz (115.1 kg)  BMI 33.49 kg/m2  SpO2 98%  HEMODYNAMICS: CVP: [5 mmHg-12 mmHg] 12 mmHg  VENTILATOR SETTINGS: Vent Mode: [-] PRVC FiO2 (%): [35 %] 35 % Set Rate: [25 bmp] 25 bmp Vt Set: [600 mL] 600 mL PEEP: [5 cmH20] 5 cmH20  INTAKE / OUTPUT: I/O last 3 completed shifts: In: 4237.1 [I.V.:1557.1; Other:310; NG/GT:2070; IV Piggyback:300] Out: 2811 [Urine:70; Other:2741]  PHYSICAL EXAMINATION: General: RASS -2, WDWN Neuro: PERRL, EOMI, MAEs, DTRs symmetric HEENT: NCAT, WNL Cardiovascular: Regular, no murmurs  Lungs: diffuse rhonchi, dependent crackles Abdomen: soft, NT, ND, diminished BS Ext: warm, + cyanosis, increasing LE edema GU: severe scrotal and penile edema  LABS:  BMET  Last Labs      Recent Labs Lab 09/05/15 1528 09/05/15 2151 09/06/15 0302  NA 135 134* 137  K 4.8 5.0 5.0  CL 105 105 108  CO2 BUN 72* 82* 82*  CREATININE 3.37* 3.78* 3.52*  GLUCOSE 167* 165* 163*      Electrolytes  Last Labs      Recent Labs Lab 09/04/15 0421  09/05/15 0437  09/05/15 1528 09/05/15 2151 09/06/15 0302 09/06/15 0503  CALCIUM 7.7* < > --  < > 8.0* 7.4* 7.7* --  MG 2.3 --  2.5* --  --  --  --  2.6*  PHOS 2.4* --  2.5 --  --  --  --  3.4  < > = values in this interval not displayed.    CBC  Last Labs      Recent Labs Lab 09/03/15 0443 09/05/15 0437 09/06/15 0503  WBC 19.0* 17.1* 18.3*  HGB 9.1* 6.8* 7.7*  HCT 28.0* 21.5* 23.5*  PLT 66* 66* 69*       Coag's  Last Labs      Recent Labs Lab 08/30/15 1543 08/31/15 0447  APTT --  34  INR 1.21 --       Sepsis Markers  Last Labs      Recent Labs Lab 08/30/15 1543 09/01/15 1024 09/02/15 0519 09/03/15 0443  LATICACIDVEN 5.3* --  --  --   PROCALCITON --  2.28 2.12 32.90      ABG  Last Labs      Recent Labs Lab 09/02/15 0742 09/02/15 2200 09/04/15 0815  PHART 7.45 7.36 7.42  PCO2ART 40 48 46  PO2ART 54* 51* 86      Liver Enzymes  Last Labs      Recent Labs Lab 08/31/15 1735 09/01/15 0435 09/03/15 0443  AST 497* 442* 287*  ALT 147* 148* 193*  ALKPHOS 38 48 84  BILITOT 0.7 0.7 0.9  ALBUMIN 2.3* 2.2* 1.6*      Cardiac Enzymes  Last Labs      Recent Labs Lab 08/30/15 1543 09/02/15 1419 09/03/15 0443  TROPONINI 0.50* 0.32* 0.23*      Glucose  Last Labs      Recent Labs Lab 09/05/15 0018 09/05/15 0735 09/05/15 1138 09/05/15 1623 09/05/15 2324 09/06/15 0746  GLUCAP 165* 175* 145* 153* 138* 154*      CXR: increasing edema pattern   ASSESSMENT / PLAN: 50 yo white male with acute hypoxic resp failrue from acute INF A pneumonia with superimposed bacterial pneumonia complicated by acute rhabdo with acute and severe acidosis with ARF  PULMONARY A: Acute hypoxic resp failure-showing signs of failure to wean from vent Pulmonary edema Suspect VAP P:  Cont vent support - settings reviewed and/or adjusted Cont vent bundle Increase volume removal  -will need aggressive vent support  -wean fio2 and PEEP as tolerated  CARDIOVASCULAR A:  Shock, recurrent P:  MAP goal > 65 mmHg Cont norepinephrine infusion Cont hydrocortisone - wean dose as permitted by BP  RENAL A:  AKI - likely ATN + NSAIDS Severe lactic acidosis, resolved Severe hypervolemia P:  Monitor BMET intermittently Monitor I/Os Correct electrolytes as  indicated CRRT per Renal service   GASTROINTESTINAL A:  Heme + stools P:  SUP: enteral PPI Cont TFs  HEMATOLOGIC A:  Polycythemia, resolved Mild anemia, acute blood loss - hemodynamically stable Severe thrombocytopenia  P:  DVT px: SCDs Monitor CBC intermittently Transfuse per usual guidelines Platelet transfusion 04/02 prior to HD cath placement  INFECTIOUS A:  Severe sepsis P:  Monitor temp, WBC count Micro and abx as above  ENDOCRINE A:  Mild stress induced hyperglycemia Risk of hypoglycemia in setting of AKI P:  DC SSI CBG q 8 hrs Consider resuming SSI for glu > 180  NEUROLOGIC A:  Acute ICU acquired encephalopathy ICU/vent associated discomfort P:  RASS goal: -2, -3 Transition dex to midaz Cont fentanyl infusion Cont PRN midazolam Cont PRN fentanyl  Lucie LeatherKurian David Kirk Sampley, M.D.  [facility]  Medical Director [facility] [place]  Medical Director [facility] Cardio-Pulmonary Department

## 2015-09-06 NOTE — Progress Notes (Signed)
Pre HD Tx Patient & Machine Checks 

## 2015-09-06 NOTE — Progress Notes (Signed)
PULMONARY / CRITICAL CARE MEDICINE   Name: Peterson AoWilliam E Brotzman MRN: [id number] DOB: [birth year]    ADMISSION DATE:  08/27/2015 CONSULTATION DATE:  08/28/15    PT PROFILE: [name] previously healthy [age] M admitted to gen med floor with several days of malaise and profound myalgias. Influenza PCR positive. On admission was noted to be hemoconcentrated with AKI and elevated CPK. On 03/28 developed progressive AMS and found to have profound metabolic acidosis. Transferred to ICU and PCCM consulted for management of acidosis, rhabdomyolysis, MODS, shock. Intubated, CVL placed and HD cath placed on arrival to ICU. Empiric abx initiated  MAJOR EVENTS/TEST RESULTS: 03/27 Admitted as above 03/28 transferred to ICU, intubated, CVL placed, HD cath placed, vasopressors initiated, Renal consult, CRRT initiated, HCO3 gtt initiated, empiric abx initated 03/28 CT head: NAD 03/28 Echocardiogram: LVEF 60-65%, grade one diastolic dysfunction 03/28 CTAP: no acute findings 03/30 BLE venous US: no DVT 03/31 Acidosis much improved. CRRT stopped, HCO3 gtt stopped 03/31 Transition off midaz infusion to dexmedetomidine 04/01 CRRT resumed. Back on low dose fentanyl infusion. Failed SBT. Trial furosemide with minimal response 04/02 Increasing agitation. Dexmedetomidine transitioned to propofol. R femoral HD cath not functioning and replaced after platelet transfusion. CXR pattern c/w edema, severe LE and scrotal edema - volume removal via CRRT 04/02 Worsening gas exchange requiring increased vent support and heavier sedation. HD cath required replacement. Platelets transfused prior to placement. Wife apprised several times over course of day 4/3 remains on CRRT very low UO 4/5 patient failed SAT/SBT due to resp muscle fatigue  INDWELLING DEVICES:: ETT 03/28 >>  L [employer] CVL 03/28 >>  R femoral HD cath 03/28 >> 04/02 R femoral A-line 03/28 >>  R IJ HD cath 04/02 >>   MICRO DATA: Flu PCR 03/27 >> positive for A, neg for  H1N1 MRSA PCR 03/28 >> NEG Urine 03/27 >> NEG Blood 03/28 >> NEG Resp 03/29 >> NOF HIV 03/31 >> NEG Resp 03/31 >>enterococcus faecalis C diff 04/02 >> NEG Resp 04/02 >>    ANTIMICROBIALS:  Oseltamivir 03/27 >> 04/01 Vanc 03/28 >> 03/31 Cefepime 03/28 >>    SUBJECTIVE:  RASS -3 to +1. Intermittently F/C Remains intubated, failed sat/sbt family at bedside yesterday, will try again today when family arrives fio2 at 35% PEEP at 5, on CRRT, off vasopressors this AM   VITAL SIGNS: BP 104/73 mmHg  Pulse 96  Temp(Src) 97.9 F (36.6 C) (Axillary)  Resp 14  Ht 6\' 1"  (1.854 m)  Wt 253 lb 12 oz (115.1 kg)  BMI 33.49 kg/m2  SpO2 98%  HEMODYNAMICS: CVP:  [5 mmHg-12 mmHg] 12 mmHg  VENTILATOR SETTINGS: Vent Mode:  [-] PRVC FiO2 (%):  [35 %] 35 % Set Rate:  [25 bmp] 25 bmp Vt Set:  [600 mL] 600 mL PEEP:  [5 cmH20] 5 cmH20  INTAKE / OUTPUT: I/O last 3 completed shifts: In: 4237.1 [I.V.:1557.1; Other:310; NG/GT:2070; IV Piggyback:300] Out: 2811 [Urine:70; Other:2741]  PHYSICAL EXAMINATION: General: RASS -2, WDWN Neuro: PERRL, EOMI, MAEs, DTRs symmetric HEENT: NCAT, WNL Cardiovascular: Regular, no murmurs  Lungs: diffuse rhonchi, dependent crackles Abdomen: soft, NT, ND, diminished BS Ext: warm, + cyanosis, increasing LE edema GU: severe scrotal and penile edema  LABS:  BMET  Recent Labs Lab 09/05/15 1528 09/05/15 2151 09/06/15 0302  NA 135 134* 137  K 4.8 5.0 5.0  CL 105 105 108  CO2 24 23 25   BUN 72* 82* 82*  CREATININE 3.37* 3.78* 3.52*  GLUCOSE 167* 165* 163*  Electrolytes  Recent Labs Lab 09/04/15 0421  09/05/15 0437  09/05/15 1528 09/05/15 2151 09/06/15 0302 09/06/15 0503  CALCIUM 7.7*  < >  --   < > 8.0* 7.4* 7.7*  --   MG 2.3  --  2.5*  --   --   --   --  2.6*  PHOS 2.4*  --  2.5  --   --   --   --  3.4  < > = values in this interval not displayed.  CBC  Recent Labs Lab 09/03/15 0443 09/05/15 0437 09/06/15 0503  WBC 19.0* 17.1*  18.3*  HGB 9.1* 6.8* 7.7*  HCT 28.0* 21.5* 23.5*  PLT 66* 66* 69*    Coag's  Recent Labs Lab 08/30/15 1543 08/31/15 0447  APTT  --  34  INR 1.21  --     Sepsis Markers  Recent Labs Lab 08/30/15 1543 09/01/15 1024 09/02/15 0519 09/03/15 0443  LATICACIDVEN 5.3*  --   --   --   PROCALCITON  --  2.28 2.12 32.90    ABG  Recent Labs Lab 09/02/15 0742 09/02/15 2200 09/04/15 0815  PHART 7.45 7.36 7.42  PCO2ART 40 48 46  PO2ART 54* 51* 86    Liver Enzymes  Recent Labs Lab 08/31/15 1735 09/01/15 0435 09/03/15 0443  AST 497* 442* 287*  ALT 147* 148* 193*  ALKPHOS 38 48 84  BILITOT 0.7 0.7 0.9  ALBUMIN 2.3* 2.2* 1.6*    Cardiac Enzymes  Recent Labs Lab 08/30/15 1543 09/02/15 1419 09/03/15 0443  TROPONINI 0.50* 0.32* 0.23*    Glucose  Recent Labs Lab 09/05/15 0018 09/05/15 0735 09/05/15 1138 09/05/15 1623 09/05/15 2324 09/06/15 0746  GLUCAP 165* 175* 145* 153* 138* 154*    CXR: increasing edema pattern   ASSESSMENT / PLAN: 50 yo white male with acute hypoxic resp failrue from acute INF A pneumonia with superimposed bacterial pneumonia complicated by acute rhabdo with acute and severe acidosis with ARF  PULMONARY A: Acute hypoxic resp failure-showing signs of failure to wean from vent Pulmonary edema Suspect VAP P:   Cont vent support - settings reviewed and/or adjusted Cont vent bundle Increase volume removal  -will need aggressive vent support  -wean fio2 and PEEP as tolerated  CARDIOVASCULAR A:  Shock, recurrent P:  MAP goal > 65 mmHg Cont norepinephrine infusion Cont hydrocortisone - wean dose as permitted by BP  RENAL A:   AKI - likely ATN + NSAIDS Severe lactic acidosis, resolved Severe hypervolemia P:   Monitor BMET intermittently Monitor I/Os Correct electrolytes as indicated CRRT per Renal service   GASTROINTESTINAL A:   Heme + stools P:   SUP: enteral PPI Cont TFs  HEMATOLOGIC A:   Polycythemia,  resolved Mild anemia, acute blood loss - hemodynamically stable Severe thrombocytopenia  P:  DVT px: SCDs Monitor CBC intermittently Transfuse per usual guidelines Platelet transfusion 04/02 prior to HD cath placement  INFECTIOUS A:   Severe sepsis P:   Monitor temp, WBC count Micro and abx as above  ENDOCRINE A:   Mild stress induced hyperglycemia Risk of hypoglycemia in setting of AKI P:   DC SSI CBG q 8 hrs Consider resuming SSI for glu > 180  NEUROLOGIC A:   Acute ICU acquired encephalopathy ICU/vent associated discomfort P:   RASS goal: -2, -3 Transition dex to midaz Cont fentanyl infusion Cont PRN midazolam Cont PRN fentanyl   I have personally obtained a history, examined the patient, evaluated Pertinent laboratory and [employer]/imaging  results, and  formulated the assessment and plan   The Patient requires high complexity decision making for assessment and support, frequent evaluation and titration of therapies, application of advanced monitoring technologies and extensive interpretation of multiple databases. Critical Care Time devoted to patient care services described in this note is 35 minutes.   Overall, patient is critically ill, prognosis is guarded.  Patient with Multiorgan failure and at high risk for cardiac arrest and death.   Supposedly wife wants to transfer to [facility], I have called [facility] and they have nothing else to offer patient, they have accepted and placed patient on waiting list. Will update family when they arrive   Lucie Leather, M.D.  [facility]  Medical Director [facility] Medical Director [facility] Cardio-Pulmonary Department

## 2015-09-06 NOTE — Progress Notes (Signed)
HD Tx Termination 

## 2015-09-06 NOTE — Care Management (Signed)
Patient will be transitioned to hemodialysis. Patient is going to require trach and peg. Family has requested transfer to [facility] and information  faxed and received confirmation.  Now that patient may also qualify for ltac now that he is not on crrt.  Attending will speak with family about this option

## 2015-09-06 NOTE — Progress Notes (Signed)
Transported pt to ICU 15 due to a leak in ICU16. Pt remained on the vent during transport.

## 2015-09-06 NOTE — Progress Notes (Signed)
eLink Physician-Brief Progress Note Patient Name: John Hoover DOB: [birth year] MRN: [id number]   Date of Service  09/06/2015  HPI/Events of Note  Patient with flexiseal for diarrhea.  On senokot  eICU Interventions  Senokot d/ced     Intervention Category Minor Interventions: Routine modifications to care plan (e.g. PRN medications for pain, fever)  John Hoover 09/06/2015, 11:24 PM

## 2015-09-06 NOTE — Progress Notes (Signed)
Pharmacy Antibiotic Note  John Hoover is a 50 y.o. male admitted on 08/27/2015 with pneumonia.  Pharmacy has been consulted for Zosyn dosing. 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 Pharmacy now consulted to add bactrim to his regimen.  Plan: Zosyn 3.375g IV q8h (4 hour infusion).   Based on adjusted BW of 94kg and dosing recommendation of /kg of trimethoprim, will give bactrim with  of trimethoprim IV x1 after dialysis today.   Will reassess dialysis schedule tomorrow with nephrology.   Height:  (185.4 cm) Weight: 267 lb 13.7 oz (121.5 kg) IBW/kg (Calculated) : 79.9  Temp (24hrs), Avg:97.8 F (36.6 C), Min:95.9 F (35.5 C), Max:99.3 F (37.4 C)   Recent Labs Lab 09/01/15 0435  09/02/15 0519  09/03/15 0443  09/05/15 0437  09/05/15 1528 09/05/15 2151 09/06/15 0302 09/06/15 0503 09/06/15 0938 09/06/15 1124  WBC 20.1*  --  19.5*  --  19.0*  --  17.1*  --   --   --   --  18.3*  --   --   CREATININE 3.29*  < > 3.44*  < > 3.28*  < >  --   < > 3.37* 3.78* 3.52*  --  3.35* 3.31*  < > = values in this interval not displayed.  Estimated Creatinine Clearance: 36 mL/min (by C-G formula based on Cr of 3.31).    No Known Allergies  Antimicrobials this admission: Anti-infectives    Start     Dose/Rate Route Frequency Ordered Stop   09/06/15 1900  sulfamethoxazole-trimethoprim (BACTRIM) 940 mg of trimethoprim in dextrose 5 % 500 mL IVPB     940 mg of trimethoprim 372.5 mL/hr over 90 Minutes Intravenous  Once 09/06/15 1618     09/05/15 1200  piperacillin-tazobactam (ZOSYN) IVPB 3.375 g     3.375 g 12.5 mL/hr over 240 Minutes Intravenous 3 times per day 09/05/15 1108     09/04/15 1515  ampicillin (OMNIPEN) 2 g in sodium chloride 0.9 % 50 mL IVPB  Status:  Discontinued     2 g 150 mL/hr over 20 Minutes Intravenous 3 times per day 09/04/15 1511 09/05/15 1107   09/03/15 2200  ceFAZolin  (ANCEF) IVPB 2g/100 mL premix  Status:  Discontinued     2 g 200 mL/hr over 30 Minutes Intravenous Every 12 hours 09/03/15 1251 09/04/15 1511   09/03/15 1300  ceFAZolin (ANCEF) IVPB 2g/100 mL premix  Status:  Discontinued     2 g 200 mL/hr over 30 Minutes Intravenous Every 12 hours 09/03/15 1250 09/03/15 1251   09/03/15 0030  vancomycin (VANCOCIN) IVPB 750 mg/150 ml premix  Status:  Discontinued     750 mg 150 mL/hr over 60 Minutes Intravenous Every 24 hours 09/02/15 1454 09/03/15 1124   09/02/15 1530  vancomycin (VANCOCIN) IVPB 750 mg/150 ml premix     750 mg 150 mL/hr over 60 Minutes Intravenous  Once 09/02/15 1454 09/02/15 1651   09/02/15 1445  vancomycin (VANCOCIN) 50 mg/mL oral solution 125 mg  Status:  Discontinued     125 mg Per Tube 4 times per day 09/02/15 1433 09/03/15 0052   08/28/15 2200  ceFEPIme (MAXIPIME) 2 g in dextrose 5 % 50 mL IVPB  Status:  Discontinued     2 g 100 mL/hr over 30 Minutes Intravenous Every 12 hours 08/28/15 1726 09/03/15 1124   08/28/15 2200  oseltamivir (TAMIFLU) 6 MG/ML suspension 30 mg  Status:  Discontinued  30 mg Per Tube 2 times daily 08/28/15 1741 08/28/15 1855   08/28/15 2000  vancomycin (VANCOCIN) IVPB 1000 mg/200 mL premix  Status:  Discontinued     1,000 mg 200 mL/hr over 60 Minutes Intravenous Every 24 hours 08/28/15 1838 08/30/15 1637   08/28/15 1900  oseltamivir (TAMIFLU) 6 MG/ML suspension 30 mg  Status:  Discontinued     30 mg Per Tube Daily 08/28/15 1855 09/01/15 1056   08/28/15 1745  vancomycin (VANCOCIN) 1,250 mg in sodium chloride 0.9 % 250 mL IVPB  Status:  Discontinued     1,250 mg 166.7 mL/hr over 90 Minutes Intravenous STAT 08/28/15 1730 08/28/15 1837   08/27/15 2200  oseltamivir (TAMIFLU) capsule 75 mg  Status:  Discontinued     75 mg Oral 2 times daily 08/27/15 1806 08/27/15 1814   08/27/15 1900  cefTRIAXone (ROCEPHIN) 1 g in dextrose 5 % 50 mL IVPB  Status:  Discontinued     1 g 100 mL/hr over 30 Minutes Intravenous Every  24 hours 08/27/15 1806 08/28/15 1420      Microbiology results: Results for orders placed or performed during the hospital encounter of 08/27/15  Rapid Influenza A&B Antigens ([facility] only)     Status: None   Collection Time: 08/27/15 12:22 PM  Result Value Ref Range Status   Influenza A ([facility]) NEGATIVE NEGATIVE Final   Influenza B ([facility]) NEGATIVE NEGATIVE Final  Urine culture     Status: None   Collection Time: 08/27/15  7:40 PM  Result Value Ref Range Status   Specimen Description URINE, RANDOM  Final   Special Requests Normal  Final   Culture NO GROWTH 1 DAY  Final   Report Status 08/29/2015 FINAL  Final  MRSA PCR Screening     Status: None   Collection Time: 08/28/15  5:25 PM  Result Value Ref Range Status   MRSA by PCR NEGATIVE NEGATIVE Final    Comment:        The GeneXpert MRSA Assay (FDA approved for NASAL specimens only), is one component of a comprehensive MRSA colonization surveillance program. It is not intended to diagnose MRSA infection nor to guide or monitor treatment for MRSA infections.   Culture, blood (Routine X 2) w Reflex to ID Panel     Status: None   Collection Time: 08/28/15  5:51 PM  Result Value Ref Range Status   Specimen Description BLOOD RIGHT HAND  Final   Special Requests BAA,ANA,AER,5ML  Final   Culture NO GROWTH 5 DAYS  Final   Report Status 09/02/2015 FINAL  Final  Culture, blood (Routine X 2) w Reflex to ID Panel     Status: None   Collection Time: 08/28/15  5:51 PM  Result Value Ref Range Status   Specimen Description BLOOD RIGHT ASSIST CONTROL  Final   Special Requests BAA,ANA,AER,5ML  Final   Culture NO GROWTH 5 DAYS  Final   Report Status 09/02/2015 FINAL  Final  Culture, respiratory (NON-Expectorated)     Status: None   Collection Time: 08/29/15  2:16 PM  Result Value Ref Range Status   Specimen Description TRACHEAL ASPIRATE  Final   Special Requests NONE  Final   Gram Stain FEW WBC SEEN FEW YEAST   Final   Culture  Consistent with normal respiratory flora.  Final   Report Status 09/01/2015 FINAL  Final  Culture, respiratory (NON-Expectorated)     Status: None   Collection Time: 08/31/15  3:45 PM  Result Value Ref Range  Status   Specimen Description TRACHEAL ASPIRATE  Final   Special Requests NONE  Final   Gram Stain   Final    FEW WBC SEEN MANY GRAM POSITIVE RODS MODERATE GRAM POSITIVE COCCI RARE YEAST    Culture HEAVY GROWTH ENTEROCOCCUS FAECALIS  Final   Report Status 09/03/2015 FINAL  Final   Organism ID, Bacteria ENTEROCOCCUS FAECALIS  Final      Susceptibility   Enterococcus faecalis - MIC*    AMPICILLIN <=2 SENSITIVE Sensitive     VANCOMYCIN 1 SENSITIVE Sensitive     GENTAMICIN SYNERGY SENSITIVE Sensitive     LINEZOLID 2 SENSITIVE Sensitive     * HEAVY GROWTH ENTEROCOCCUS FAECALIS  Culture, blood (Routine X 2) w Reflex to ID Panel     Status: None (Preliminary result)   Collection Time: 09/02/15  3:18 PM  Result Value Ref Range Status   Specimen Description BLOOD LINE  Final   Special Requests   Final    BOTTLES DRAWN AEROBIC AND ANAEROBIC  AER 4CC ANA 1CC   Culture NO GROWTH 4 DAYS  Final   Report Status PENDING  Incomplete  Culture, blood (Routine X 2) w Reflex to ID Panel     Status: None (Preliminary result)   Collection Time: 09/02/15  3:20 PM  Result Value Ref Range Status   Specimen Description BLOOD RIGHT ARM  Final   Special Requests BOTTLES DRAWN AEROBIC AND ANAEROBIC  8CC  Final   Culture NO GROWTH 4 DAYS  Final   Report Status PENDING  Incomplete  Culture, respiratory (NON-Expectorated)     Status: None   Collection Time: 09/02/15  4:30 PM  Result Value Ref Range Status   Specimen Description TRACHEAL ASPIRATE  Final   Special Requests NONE  Final   Gram Stain   Final    MODERATE WBC SEEN FEW GRAM POSITIVE COCCI IN PAIRS    Culture LIGHT GROWTH STENOTROPHOMONAS MALTOPHILIA  Final   Report Status 09/06/2015 FINAL  Final   Organism ID, Bacteria STENOTROPHOMONAS  MALTOPHILIA  Final      Susceptibility   Stenotrophomonas maltophilia - MIC*    LEVOFLOXACIN <=0.12 SENSITIVE Sensitive     TRIMETH/SULFA <=20 SENSITIVE Sensitive     * LIGHT GROWTH STENOTROPHOMONAS MALTOPHILIA  C difficile quick scan w PCR reflex     Status: None   Collection Time: 09/02/15  4:56 PM  Result Value Ref Range Status   C Diff antigen NEGATIVE NEGATIVE Final   C Diff toxin NEGATIVE NEGATIVE Final   C Diff interpretation Negative for C. difficile  Final    Thank you for allowing pharmacy to be a part of this patient's care.  Cy Blamerllison K Mauri Temkin 09/06/2015 4:20 PM

## 2015-09-06 NOTE — Progress Notes (Signed)
This RN took over the care of patient at 3pm, dialysis RN Jasmine DecemberSharon in room to start patient on hemodialysis.  Patient currently vented and sedated in no apparent distress.  RN continue to monitor.

## 2015-09-07 ENCOUNTER — Inpatient Hospital Stay: Payer: BLUE CROSS/BLUE SHIELD

## 2015-09-07 LAB — BLOOD GAS, ARTERIAL
ACID-BASE EXCESS: 2.2 mmol/L (ref 0.0–3.0)
Allens test (pass/fail): POSITIVE — AB
BICARBONATE: 26.5 meq/L (ref 21.0–28.0)
FIO2: 0.3
O2 SAT: 92.9 %
PATIENT TEMPERATURE: 37
PCO2 ART: 39 mmHg (ref 32.0–48.0)
PEEP: 5 cmH2O
PH ART: 7.44 (ref 7.350–7.450)
PRESSURE SUPPORT: 5 cmH2O
pO2, Arterial: 64 mmHg — ABNORMAL LOW (ref 83.0–108.0)

## 2015-09-07 LAB — CBC
HCT: 22.3 % — ABNORMAL LOW (ref 40.0–52.0)
Hemoglobin: 7.2 g/dL — ABNORMAL LOW (ref 13.0–18.0)
MCH: 29.2 pg (ref 26.0–34.0)
MCHC: 32.1 g/dL (ref 32.0–36.0)
MCV: 90.9 fL (ref 80.0–100.0)
PLATELETS: 96 10*3/uL — AB (ref 150–440)
RBC: 2.45 MIL/uL — AB (ref 4.40–5.90)
RDW: 15.5 % — AB (ref 11.5–14.5)
WBC: 19 10*3/uL — ABNORMAL HIGH (ref 3.8–10.6)

## 2015-09-07 LAB — COMPREHENSIVE METABOLIC PANEL
ALT: 57 U/L (ref 17–63)
ANION GAP: 4 — AB (ref 5–15)
AST: 103 U/L — ABNORMAL HIGH (ref 15–41)
Albumin: 2.4 g/dL — ABNORMAL LOW (ref 3.5–5.0)
Alkaline Phosphatase: 87 U/L (ref 38–126)
BUN: 80 mg/dL — ABNORMAL HIGH (ref 6–20)
CHLORIDE: 104 mmol/L (ref 101–111)
CO2: 27 mmol/L (ref 22–32)
Calcium: 7.6 mg/dL — ABNORMAL LOW (ref 8.9–10.3)
Creatinine, Ser: 3.31 mg/dL — ABNORMAL HIGH (ref 0.61–1.24)
GFR, EST AFRICAN AMERICAN: 23 mL/min — AB (ref >60)
GFR, EST NON AFRICAN AMERICAN: 20 mL/min — AB (ref >60)
Glucose, Bld: 151 mg/dL — ABNORMAL HIGH (ref 65–99)
POTASSIUM: 5.4 mmol/L — AB (ref 3.5–5.1)
SODIUM: 135 mmol/L (ref 135–145)
Total Bilirubin: 0.5 mg/dL (ref 0.3–1.2)
Total Protein: 5 g/dL — ABNORMAL LOW (ref 6.5–8.1)

## 2015-09-07 LAB — PHOSPHORUS: Phosphorus: 4.2 mg/dL (ref 2.5–4.6)

## 2015-09-07 LAB — HEPATITIS C ANTIBODY

## 2015-09-07 LAB — GLUCOSE, CAPILLARY
GLUCOSE-CAPILLARY: 155 mg/dL — AB (ref 65–99)
GLUCOSE-CAPILLARY: 152 mg/dL — AB (ref 65–99)
Glucose-Capillary: 122 mg/dL — ABNORMAL HIGH (ref 65–99)
Glucose-Capillary: 113 mg/dL — ABNORMAL HIGH (ref 65–99)

## 2015-09-07 LAB — HEPATITIS B SURFACE ANTIBODY,QUALITATIVE: HEP B S AB: NONREACTIVE

## 2015-09-07 LAB — CULTURE, BLOOD (ROUTINE X 2)
CULTURE: NO GROWTH
Culture: NO GROWTH

## 2015-09-07 LAB — HEPATITIS B CORE ANTIBODY, TOTAL: Hep B Core Total Ab: NEGATIVE

## 2015-09-07 LAB — HEPATITIS B SURFACE ANTIGEN: HEP B S AG: NEGATIVE

## 2015-09-07 LAB — MAGNESIUM: MAGNESIUM: 2.5 mg/dL — AB (ref 1.7–2.4)

## 2015-09-07 MED ORDER — ALBUMIN HUMAN 25 % IV SOLN
12.5000 g | Freq: Once | INTRAVENOUS | Status: AC
  Administered 2015-09-07: 12.5 g via INTRAVENOUS
  Filled 2015-09-07: qty 50

## 2015-09-07 MED ORDER — SULFAMETHOXAZOLE-TRIMETHOPRIM 400-80 MG/5ML IV SOLN
940.0000 mg | Freq: Once | INTRAVENOUS | Status: DC
  Filled 2015-09-07: qty 58.8

## 2015-09-07 MED ORDER — OXYCODONE HCL 5 MG PO TABS
2.5000 mg | ORAL_TABLET | Freq: Four times a day (QID) | ORAL | Status: DC
  Administered 2015-09-07 – 2015-09-10 (×12): 2.5 mg via ORAL
  Filled 2015-09-07 (×14): qty 1

## 2015-09-07 MED ORDER — DEXMEDETOMIDINE HCL IN NACL 200 MCG/50ML IV SOLN
0.0000 ug/kg/h | INTRAVENOUS | Status: DC
  Administered 2015-09-07: 0.3 ug/kg/h via INTRAVENOUS
  Administered 2015-09-08: 0.988 ug/kg/h via INTRAVENOUS
  Administered 2015-09-08 (×2): 1 ug/kg/h via INTRAVENOUS
  Administered 2015-09-09 (×2): 0.017 ug/kg/h via INTRAVENOUS
  Administered 2015-09-09: 0.988 ug/kg/h via INTRAVENOUS
  Administered 2015-09-09: 0.74 ug/kg/h via INTRAVENOUS
  Administered 2015-09-09: 0.297 ug/kg/h via INTRAVENOUS
  Filled 2015-09-07 (×7): qty 50

## 2015-09-07 MED ORDER — PIPERACILLIN-TAZOBACTAM 3.375 G IVPB
3.3750 g | Freq: Two times a day (BID) | INTRAVENOUS | Status: DC
  Administered 2015-09-07 – 2015-09-09 (×5): 3.375 g via INTRAVENOUS
  Filled 2015-09-07 (×6): qty 50

## 2015-09-07 MED ORDER — PRO-STAT SUGAR FREE PO LIQD
30.0000 mL | ORAL | Status: DC
  Administered 2015-09-07 – 2015-09-10 (×13): 30 mL via ORAL

## 2015-09-07 NOTE — Progress Notes (Signed)
Pt unable to follow commands when versed on hold pt continues to remain agitated Dr. Belia HemanKasa aware pt continues to show s/s of delirium and not following commands when sedation delayed md acknowledged no further orders at this time

## 2015-09-07 NOTE — Progress Notes (Signed)
Pharmacy Antibiotic Note  John Hoover is a 50 y.o. male admitted on 08/27/2015 with pneumonia. 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 Pharmacy consulted for zosyn and bactrim.  Plan: Zosyn 3.375gm EIV q12hrs due to HD  Based on adjusted BW of 94kg and dosing recommendation of /kg of trimethoprim, will give bactrim with  of trimethoprim IV x1 after dialysis today.   Will need to reassess dialysis schedule tomorrow with nephrology, as patient is new to dialysis and hasn't gotten a set schedule yet.  Height:  (185.4 cm) Weight: 252 lb 10.4 oz (114.6 kg) IBW/kg (Calculated) : 79.9  Temp (24hrs), Avg:98.2 F (36.8 C), Min:96.6 F (35.9 C), Max:99.5 F (37.5 C)   Recent Labs Lab 09/02/15 0519  09/03/15 0443  09/05/15 0437  09/05/15 2151 09/06/15 0302 09/06/15 0503 09/06/15 0938 09/06/15 1124 09/07/15 0515  WBC 19.5*  --  19.0*  --  17.1*  --   --   --  18.3*  --   --  19.0*  CREATININE 3.44*  < > 3.28*  < >  --   < > 3.78* 3.52*  --  3.35* 3.31* 3.31*  < > = values in this interval not displayed.  Estimated Creatinine Clearance: 35 mL/min (by C-G formula based on Cr of 3.31).    No Known Allergies  Antimicrobials this admission: Anti-infectives    Start     Dose/Rate Route Frequency Ordered Stop   09/06/15 1900  sulfamethoxazole-trimethoprim (BACTRIM) 940 mg of trimethoprim in dextrose 5 % 500 mL IVPB     940 mg of trimethoprim 372.5 mL/hr over 90 Minutes Intravenous  Once 09/06/15 1618 09/06/15 2010   09/05/15 1200  piperacillin-tazobactam (ZOSYN) IVPB 3.375 g     3.375 g 12.5 mL/hr over 240 Minutes Intravenous 3 times per day 09/05/15 1108     09/04/15 1515  ampicillin (OMNIPEN) 2 g in sodium chloride 0.9 % 50 mL IVPB  Status:  Discontinued     2 g 150 mL/hr over 20 Minutes Intravenous 3 times per day 09/04/15 1511 09/05/15 1107   09/03/15 2200  ceFAZolin (ANCEF) IVPB 2g/100  mL premix  Status:  Discontinued     2 g 200 mL/hr over 30 Minutes Intravenous Every 12 hours 09/03/15 1251 09/04/15 1511   09/03/15 1300  ceFAZolin (ANCEF) IVPB 2g/100 mL premix  Status:  Discontinued     2 g 200 mL/hr over 30 Minutes Intravenous Every 12 hours 09/03/15 1250 09/03/15 1251   09/03/15 0030  vancomycin (VANCOCIN) IVPB 750 mg/150 ml premix  Status:  Discontinued     750 mg 150 mL/hr over 60 Minutes Intravenous Every 24 hours 09/02/15 1454 09/03/15 1124   09/02/15 1530  vancomycin (VANCOCIN) IVPB 750 mg/150 ml premix     750 mg 150 mL/hr over 60 Minutes Intravenous  Once 09/02/15 1454 09/02/15 1651   09/02/15 1445  vancomycin (VANCOCIN) 50 mg/mL oral solution 125 mg  Status:  Discontinued     125 mg Per Tube 4 times per day 09/02/15 1433 09/03/15 0052   08/28/15 2200  ceFEPIme (MAXIPIME) 2 g in dextrose 5 % 50 mL IVPB  Status:  Discontinued     2 g 100 mL/hr over 30 Minutes Intravenous Every 12 hours 08/28/15 1726 09/03/15 1124   08/28/15 2200  oseltamivir (TAMIFLU) 6 MG/ML suspension 30 mg  Status:  Discontinued     30 mg Per Tube 2 times daily 08/28/15 1741 08/28/15  1855   08/28/15 2000  vancomycin (VANCOCIN) IVPB 1000 mg/200 mL premix  Status:  Discontinued     1,000 mg 200 mL/hr over 60 Minutes Intravenous Every 24 hours 08/28/15 1838 08/30/15 1637   08/28/15 1900  oseltamivir (TAMIFLU) 6 MG/ML suspension 30 mg  Status:  Discontinued     30 mg Per Tube Daily 08/28/15 1855 09/01/15 1056   08/28/15 1745  vancomycin (VANCOCIN) 1,250 mg in sodium chloride 0.9 % 250 mL IVPB  Status:  Discontinued     1,250 mg 166.7 mL/hr over 90 Minutes Intravenous STAT 08/28/15 1730 08/28/15 1837   08/27/15 2200  oseltamivir (TAMIFLU) capsule 75 mg  Status:  Discontinued     75 mg Oral 2 times daily 08/27/15 1806 08/27/15 1814   08/27/15 1900  cefTRIAXone (ROCEPHIN) 1 g in dextrose 5 % 50 mL IVPB  Status:  Discontinued     1 g 100 mL/hr over 30 Minutes Intravenous Every 24 hours 08/27/15  1806 08/28/15 1420      Microbiology results: Results for orders placed or performed during the hospital encounter of 08/27/15  Rapid Influenza A&B Antigens ([facility] only)     Status: None   Collection Time: 08/27/15 12:22 PM  Result Value Ref Range Status   Influenza A ([facility]) NEGATIVE NEGATIVE Final   Influenza B ([facility]) NEGATIVE NEGATIVE Final  Urine culture     Status: None   Collection Time: 08/27/15  7:40 PM  Result Value Ref Range Status   Specimen Description URINE, RANDOM  Final   Special Requests Normal  Final   Culture NO GROWTH 1 DAY  Final   Report Status 08/29/2015 FINAL  Final  MRSA PCR Screening     Status: None   Collection Time: 08/28/15  5:25 PM  Result Value Ref Range Status   MRSA by PCR NEGATIVE NEGATIVE Final    Comment:        The GeneXpert MRSA Assay (FDA approved for NASAL specimens only), is one component of a comprehensive MRSA colonization surveillance program. It is not intended to diagnose MRSA infection nor to guide or monitor treatment for MRSA infections.   Culture, blood (Routine X 2) w Reflex to ID Panel     Status: None   Collection Time: 08/28/15  5:51 PM  Result Value Ref Range Status   Specimen Description BLOOD RIGHT HAND  Final   Special Requests BAA,ANA,AER,5ML  Final   Culture NO GROWTH 5 DAYS  Final   Report Status 09/02/2015 FINAL  Final  Culture, blood (Routine X 2) w Reflex to ID Panel     Status: None   Collection Time: 08/28/15  5:51 PM  Result Value Ref Range Status   Specimen Description BLOOD RIGHT ASSIST CONTROL  Final   Special Requests BAA,ANA,AER,5ML  Final   Culture NO GROWTH 5 DAYS  Final   Report Status 09/02/2015 FINAL  Final  Culture, respiratory (NON-Expectorated)     Status: None   Collection Time: 08/29/15  2:16 PM  Result Value Ref Range Status   Specimen Description TRACHEAL ASPIRATE  Final   Special Requests NONE  Final   Gram Stain FEW WBC SEEN FEW YEAST   Final   Culture Consistent with normal  respiratory flora.  Final   Report Status 09/01/2015 FINAL  Final  Culture, respiratory (NON-Expectorated)     Status: None   Collection Time: 08/31/15  3:45 PM  Result Value Ref Range Status   Specimen Description TRACHEAL ASPIRATE  Final  Special Requests NONE  Final   Gram Stain   Final    FEW WBC SEEN MANY GRAM POSITIVE RODS MODERATE GRAM POSITIVE COCCI RARE YEAST    Culture HEAVY GROWTH ENTEROCOCCUS FAECALIS  Final   Report Status 09/03/2015 FINAL  Final   Organism ID, Bacteria ENTEROCOCCUS FAECALIS  Final      Susceptibility   Enterococcus faecalis - MIC*    AMPICILLIN <=2 SENSITIVE Sensitive     VANCOMYCIN 1 SENSITIVE Sensitive     GENTAMICIN SYNERGY SENSITIVE Sensitive     LINEZOLID 2 SENSITIVE Sensitive     * HEAVY GROWTH ENTEROCOCCUS FAECALIS  Culture, blood (Routine X 2) w Reflex to ID Panel     Status: None   Collection Time: 09/02/15  3:18 PM  Result Value Ref Range Status   Specimen Description BLOOD LINE  Final   Special Requests   Final    BOTTLES DRAWN AEROBIC AND ANAEROBIC  AER 4CC ANA 1CC   Culture NO GROWTH 5 DAYS  Final   Report Status 09/07/2015 FINAL  Final  Culture, blood (Routine X 2) w Reflex to ID Panel     Status: None   Collection Time: 09/02/15  3:20 PM  Result Value Ref Range Status   Specimen Description BLOOD RIGHT ARM  Final   Special Requests BOTTLES DRAWN AEROBIC AND ANAEROBIC  8CC  Final   Culture NO GROWTH 5 DAYS  Final   Report Status 09/07/2015 FINAL  Final  Culture, respiratory (NON-Expectorated)     Status: None   Collection Time: 09/02/15  4:30 PM  Result Value Ref Range Status   Specimen Description TRACHEAL ASPIRATE  Final   Special Requests NONE  Final   Gram Stain   Final    MODERATE WBC SEEN FEW GRAM POSITIVE COCCI IN PAIRS    Culture LIGHT GROWTH STENOTROPHOMONAS MALTOPHILIA  Final   Report Status 09/06/2015 FINAL  Final   Organism ID, Bacteria STENOTROPHOMONAS MALTOPHILIA  Final      Susceptibility    Stenotrophomonas maltophilia - MIC*    LEVOFLOXACIN <=0.12 SENSITIVE Sensitive     TRIMETH/SULFA <=20 SENSITIVE Sensitive     * LIGHT GROWTH STENOTROPHOMONAS MALTOPHILIA  C difficile quick scan w PCR reflex     Status: None   Collection Time: 09/02/15  4:56 PM  Result Value Ref Range Status   C Diff antigen NEGATIVE NEGATIVE Final   C Diff toxin NEGATIVE NEGATIVE Final   C Diff interpretation Negative for C. difficile  Final    Thank you for allowing pharmacy to be a part of this patient's care.  Cy Blamerllison K Ahana Najera 09/07/2015 11:50 AM

## 2015-09-07 NOTE — Progress Notes (Signed)
PULMONARY / CRITICAL CARE MEDICINE   Name: John Hoover MRN: [id number] DOB: [birth year]    ADMISSION DATE:  08/27/2015 CONSULTATION DATE:  08/28/15    PT PROFILE: [name] previously healthy 60 M admitted to gen med floor with several days of malaise and profound myalgias. Influenza PCR positive. On admission was noted to be hemoconcentrated with AKI and elevated CPK. On 03/28 developed progressive AMS and found to have profound metabolic acidosis. Transferred to ICU and PCCM consulted for management of acidosis, rhabdomyolysis, MODS, shock. Intubated, CVL placed and HD cath placed on arrival to ICU. Empiric abx initiated  MAJOR EVENTS/TEST RESULTS: 03/27 Admitted as above 03/28 transferred to ICU, intubated, CVL placed, HD cath placed, vasopressors initiated, Renal consult, CRRT initiated, HCO3 gtt initiated, empiric abx initated 03/28 CT head: NAD 03/28 Echocardiogram: LVEF 60-65%, grade one diastolic dysfunction 03/28 CTAP: no acute findings 03/30 BLE venous US: no DVT 03/31 Acidosis much improved. CRRT stopped, HCO3 gtt stopped 03/31 Transition off midaz infusion to dexmedetomidine 04/01 CRRT resumed. Back on low dose fentanyl infusion. Failed SBT. Trial furosemide with minimal response 04/02 Increasing agitation. Dexmedetomidine transitioned to propofol. R femoral HD cath not functioning and replaced after platelet transfusion. CXR pattern c/w edema, severe LE and scrotal edema - volume removal via CRRT 04/02 Worsening gas exchange requiring increased vent support and heavier sedation. HD cath required replacement. Platelets transfused prior to placement. Wife apprised several times over course of day 4/3 remains on CRRT very low UO 4/5 patient failed SAT/SBT due to resp muscle fatigue 4/6 failed SAT/SBT again, Stenotrophomonas species   INDWELLING DEVICES:: ETT 03/28 >>  L Gove CVL 03/28 >>  R femoral HD cath 03/28 >> 04/02 R femoral A-line 03/28 >>  R IJ HD cath 04/02 >>    MICRO DATA: Flu PCR 03/27 >> positive for A, neg for H1N1 MRSA PCR 03/28 >> NEG Urine 03/27 >> NEG Blood 03/28 >> NEG Resp 03/29 >> NOF HIV 03/31 >> NEG Resp 03/31 >>enterococcus faecalis C diff 04/02 >> NEG Resp 04/02 >> stenatrophomonas species   ANTIMICROBIALS:  Oseltamivir 03/27 >> 04/01 Vanc 03/28 >> 03/31 Cefepime 03/28 >>  Bactrim 4/6>>>>   SUBJECTIVE:  RASS -3 to +1. Intermittently F/C Remains intubated, failed sat/sbt family at bedside yesterday, will try again today when family arrives fio2 at 35% PEEP at 5, on CRRT, off vasopressors this AM Failed multiple wean attempts   VITAL SIGNS: BP 120/69 mmHg  Pulse 121  Temp(Src) 99.5 F (37.5 C) (Axillary)  Resp 25  Ht  (1.854 m)  Wt 252 lb 10.4 oz (114.6 kg)  BMI 33.34 kg/m2  SpO2 94%  HEMODYNAMICS: CVP:  [15 mmHg-27 mmHg] 15 mmHg  VENTILATOR SETTINGS: Vent Mode:  [-] PRVC FiO2 (%):  [35 %] 35 % Set Rate:  [25 bmp] 25 bmp Vt Set:  [600 mL] 600 mL PEEP:  [5 cmH20] 5 cmH20 Pressure Support:  [15 cmH20] 15 cmH20  INTAKE / OUTPUT: I/O last 3 completed shifts: In: 4559.9 [I.V.:946.9; NG/GT:2653; IV Piggyback:960] Out: 4624 [Urine:75; YQMVH:8469; Stool:300]  PHYSICAL EXAMINATION: General: RASS -2, WDWN Neuro: PERRL, EOMI, MAEs, DTRs symmetric HEENT: NCAT, WNL Cardiovascular: Regular, no murmurs  Lungs: diffuse rhonchi, dependent crackles Abdomen: soft, NT, ND, diminished BS Ext: warm, + cyanosis, increasing LE edema GU: severe scrotal and penile edema  LABS:  BMET  Recent Labs Lab 09/06/15 0938 09/06/15 1124 09/07/15 0515  NA 136 136 135  K 4.9 5.0 5.4*  CL 108 107 104  CO2  BUN 86* 89* 80*  CREATININE 3.35* 3.31* 3.31*  GLUCOSE 182* 175* 151*    Electrolytes  Recent Labs Lab 09/05/15 0437  09/06/15 0503 09/06/15 0938 09/06/15 1124 09/07/15 0515  CALCIUM  --   < >  --  8.0* 8.0* 7.6*  MG 2.5*  --  2.6*  --   --  2.5*  PHOS 2.5  --  3.4  --   --  4.2  < > =  values in this interval not displayed.  CBC  Recent Labs Lab 09/05/15 0437 09/06/15 0503 09/07/15 0515  WBC 17.1* 18.3* 19.0*  HGB 6.8* 7.7* 7.2*  HCT 21.5* 23.5* 22.3*  PLT 66* 69* 96*    Coag's No results for input(s): APTT, INR in the last 168 hours.  Sepsis Markers  Recent Labs Lab 09/01/15 1024 09/02/15 0519 09/03/15 0443  PROCALCITON 2.28 2.12 32.90    ABG  Recent Labs Lab 09/02/15 2200 09/04/15 0815 09/06/15 1100  PHART 7.36 7.42 7.37  PCO2ART 48 46 45  PO2ART 51* 86 71*    Liver Enzymes  Recent Labs Lab 09/03/15 0443 09/06/15 1124 09/07/15 0515  AST 287* 104* 103*  ALT 193* 58 57  ALKPHOS 84 98 87  BILITOT 0.9 0.5 0.5  ALBUMIN 1.6* 2.6* 2.4*    Cardiac Enzymes  Recent Labs Lab 09/02/15 1419 09/03/15 0443  TROPONINI 0.32* 0.23*    Glucose  Recent Labs Lab 09/05/15 2324 09/06/15 0746 09/06/15 1144 09/06/15 1635 09/06/15 2346 09/07/15 0725  GLUCAP 138* 154* 156* 134* 150* 155*    CXR: increasing edema pattern   ASSESSMENT / PLAN: 50 yo white male with acute hypoxic resp failrue from acute INF A pneumonia with superimposed bacterial pneumonia complicated by acute rhabdo with acute and severe acidosis with ARF  PULMONARY A: Acute hypoxic resp failure-showing signs of failure to wean from vent Pulmonary edema Suspect VAP P:   Cont vent support - settings reviewed and/or adjusted Cont vent bundle Increase volume removal  -will need aggressive vent support  -wean fio2 and PEEP as tolerated -will likely need trach and PEG tube  CARDIOVASCULAR A:  Shock, recurrent P:  MAP goal > 65 mmHg    RENAL A:   AKI - likely ATN + NSAIDS Severe lactic acidosis, resolved Severe hypervolemia P:   Monitor BMET intermittently Monitor I/Os Correct electrolytes as indicated CRRT per Renal service   GASTROINTESTINAL A:   Heme + stools P:   SUP: enteral PPI Cont TFs  HEMATOLOGIC A:   Polycythemia, resolved Mild  anemia, acute blood loss - hemodynamically stable Severe thrombocytopenia  P:  DVT px: SCDs Monitor CBC intermittently Transfuse per usual guidelines Platelet transfusion 04/02 prior to HD cath placement  INFECTIOUS A:   Severe sepsis P:   Monitor temp, WBC count Micro and abx as above  ENDOCRINE A:   Mild stress induced hyperglycemia Risk of hypoglycemia in setting of AKI P:   DC SSI CBG q 8 hrs Consider resuming SSI for glu > 180  NEUROLOGIC A:   Acute ICU acquired encephalopathy ICU/vent associated discomfort P:   RASS goal: -2, -3 Transition dex to midaz Cont fentanyl infusion Cont PRN midazolam Cont PRN fentanyl   I have personally obtained a history, examined the patient, evaluated Pertinent laboratory and [employer]/imaging results, and  formulated the assessment and plan   The Patient requires high complexity decision making for assessment and support, frequent evaluation and titration of therapies, application of advanced monitoring technologies  and extensive interpretation of multiple databases. Critical Care Time devoted to patient care services described in this note is 35 minutes.   Overall, patient is critically ill, prognosis is guarded.  Patient with Multiorgan failure and at high risk for cardiac arrest and death.   I have called [facility] and they have nothing else to offer patient, they have accepted and placed patient on waiting list.    Lucie LeatherKurian David Harshita Bernales, M.D.  [facility]  Medical Director [facility] Medical Director [facility]

## 2015-09-07 NOTE — Consult Note (Signed)
[facility] Dr[facility] Katrine CohoGovert, he has accepted patient and is placed on waiting for transfer No beds available at this time.    Lucie LeatherKurian David Cendy Oconnor, M.D.  [facility]  Medical Director [facility] Medical Director [facility]

## 2015-09-07 NOTE — Progress Notes (Signed)
Pt remains intubated FiO2 at 35% attempted spontaneous breathing trial during shift however pt became tachypnic heart rate elevated and showing s/s of delirium Dr. Belia HemanKasa assessed pt and spoke with family, Dr. Belia HemanKasa placed pt back on A/C mode due to the above; vss; pt currently receiving hemodialysis and tolerating well; spoke with Dr. Dema SeverinMungal about pts right femoral arterial line asked md if he wanted the line removed due to a flat waveform and  however it does give blood return; Dr. Dema SeverinMungal gave orders to leave right femoral arterial line in place due to abg's; pt tolerating tube feedings; sinus tach on cardiac monitor; pts wife updated about plan of care and questions answered will continue to monitor and assess pt

## 2015-09-07 NOTE — Progress Notes (Signed)
eLink Physician-Brief Progress Note Patient Name: John Hoover DOB: May 13, 1966 MRN: [id number]   Date of Service  09/07/2015  HPI/Events of Note  Continued agitation.    eICU Interventions  Initiated precedex. Continue with other current sedative/pain meds     Intervention Category Major Interventions: Delirium, psychosis, severe agitation - evaluation and management  Halen Mossbarger 09/07/2015, 5:20 AM

## 2015-09-07 NOTE — Care Management (Signed)
[facility]  Awaiting bed.  Packet has been made

## 2015-09-07 NOTE — Progress Notes (Signed)
Nutrition Follow-up  DOCUMENTATION CODES:   Not applicable  INTERVENTION:   EN: nutritional needs reassessed. Recommend continuing current TF goal rate while increasing Prostat supplementation to 6 times daily. Better meets protein needs, 100% calorie needs. Current TF at goal rate provides 2640 mg potassium in 24 hour period, follow electrolytes, pt also receiving dialysis. May need adjustment in TF in potassium remains elevated.  Continue to assess   NUTRITION DIAGNOSIS:   Inadequate oral intake related to acute illness as evidenced by NPO status. Being addressed via TF  GOAL:   Provide needs based on ASPEN/SCCM guidelines  MONITOR:   TF tolerance, Vent status, Labs, I & O's, Weight trends  REASON FOR ASSESSMENT:   Ventilator    ASSESSMENT:    Pt off CRRT, daily intermittent HD at present. 3 L removed yesterday, off pressors. Potassium elevated, repeat HD daily, using 2K+ bath  Patient is currently intubated on ventilator support, continues to fail weaning trials, may likely need trach/PEG per MD notes MV: 19.8 L/min Temp (24hrs), Avg:98.4 F (36.9 C), Min:97 F (36.1 C), Max:100 F (37.8 C)  Diet Order:   NPO  EN: tolerating Vital 1.5 at rate of 55 ml/hr, Prostat QID, minimal free water flushes for maintenance only  Skin:  Reviewed, no issues  Last BM:  09/07/15    Recent Labs Lab 09/05/15 0437  09/06/15 0503 09/06/15 0938 09/06/15 1124 09/07/15 0515  NA  --   < >  --  136 136 135  K  --   < >  --  4.9 5.0 5.4*  CL  --   < >  --  108 107 104  CO2  --   < >  --  25 25 27   BUN  --   < >  --  86* 89* 80*  CREATININE  --   < >  --  3.35* 3.31* 3.31*  CALCIUM  --   < >  --  8.0* 8.0* 7.6*  MG 2.5*  --  2.6*  --   --  2.5*  PHOS 2.5  --  3.4  --   --  4.2  GLUCOSE  --   < >  --  182* 175* 151*  < > = values in this interval not displayed.  Meds: reviewed  Height:   Ht Readings from Last 1 Encounters:  08/28/15 6\' 1"  (1.854 m)    Weight:  Wt  Readings from Last 1 Encounters:  09/07/15 252 lb 10.4 oz (114.6 kg)    Filed Weights   09/06/15 1500 09/06/15 1830 09/07/15 0454  Weight: 267 lb 13.7 oz (121.5 kg) 256 lb 6.3 oz (116.3 kg) 252 lb 10.4 oz (114.6 kg)    BMI:  Body mass index is 33.34 kg/(m^2).  Estimated Nutritional Needs:   Kcal:  2529 kcals (Ve: 19.8,Tmax: 37.8) using wt of 98.6 kg   Protein:  148-197 g (1.5-2.0 g/kg)   Fluid:  2L per day  EDUCATION NEEDS:   Education needs no appropriate at this time  Romelle Starcherate Louanne Calvillo MS, RD, LDN [phone] Pager  [phone] Weekend/On-Call Pager

## 2015-09-07 NOTE — Progress Notes (Signed)
[facility]  ROUNDING NOTE   Subjective:  Patient remains critically ill.   CRRT was discontinued 4/6 due to Cathter malfunction  IHD yesterday. 3 liters was removed. Patient tolerated well. UOP remains poor Off of all pressors, BP is better    Objective:  Vital signs in last 24 hours:  Temp:  [95.9 F (35.5 C)-99.5 F (37.5 C)] 98.8 F (37.1 C) (04/07 0900) Pulse Rate:  [85-121] 92 (04/07 0900) Resp:  [12-27] 21 (04/07 0900) BP: (111-126)/(65-74) 120/69 mmHg (04/06 1830) SpO2:  [94 %-100 %] 100 % (04/07 0900) Arterial Line BP: (96-176)/(52-99) 108/57 mmHg (04/07 0900) FiO2 (%):  [35 %] 35 % (04/07 0913) Weight:  [114.6 kg (252 lb 10.4 oz)-121.5 kg (267 lb 13.7 oz)] 114.6 kg (252 lb 10.4 oz) (04/07 0454)  Weight change: 7.4 kg (16 lb 5 oz) Filed Weights   09/06/15 1500 09/06/15 1830 09/07/15 0454  Weight: 121.5 kg (267 lb 13.7 oz) 116.3 kg (256 lb 6.3 oz) 114.6 kg (252 lb 10.4 oz)    Intake/Output: I/O last 3 completed shifts: In: 4559.9 [I.V.:946.9; NG/GT:2653; IV Piggyback:960] Out: 2694 [Urine:75; WNIOE:7035; Stool:300]   Intake/Output this shift:     Physical Exam: General: Critically ill   Head: +ETT +OGT  Eyes: Eyes closed,    Neck:  trachea midline, left subclavian triple lumen  Lungs:  Clear to auscultation, FiO2 35%, PEEP 5  Heart: regular  Abdomen:  Soft, nontender  Extremities: ++edema, +Scrotal edema  Neurologic: Sedation decreased, patient opened eyes.   Skin: Mottling over toes and fingers of rt hand. Rash over dorsum of feet- improving  Access: Left IJ dialysis cathter (4/2) Dr Alva Garnet    Basic Metabolic Panel:  Recent Labs Lab 09/03/15 0443  09/04/15 0421  09/05/15 0093  09/05/15 2151 09/06/15 0302 09/06/15 0503 09/06/15 8182 09/06/15 1124 09/07/15 0515  NA 137  < > 137  < >  --   < > 134* 137  --  136 136 135  K 3.7  < > 3.7  < >  --   < > 5.0 5.0  --  4.9 5.0 5.4*  CL 104  < > 106  < >  --   < > 105 108  --  108 107  104  CO2 27  < > 27  < >  --   < > 23 25  --  25 25 27   GLUCOSE 117*  < > 112*  < >  --   < > 165* 163*  --  182* 175* 151*  BUN 70*  < > 67*  < >  --   < > 82* 82*  --  86* 89* 80*  CREATININE 3.28*  < > 3.31*  < >  --   < > 3.78* 3.52*  --  3.35* 3.31* 3.31*  CALCIUM 7.2*  < > 7.7*  < >  --   < > 7.4* 7.7*  --  8.0* 8.0* 7.6*  MG 2.1  --  2.3  --  2.5*  --   --   --  2.6*  --   --  2.5*  PHOS 2.5  --  2.4*  --  2.5  --   --   --  3.4  --   --  4.2  < > = values in this interval not displayed.  Liver Function Tests:  Recent Labs Lab 08/31/15 1735 09/01/15 0435 09/03/15 0443 09/06/15 1124 09/07/15 0515  AST 497* 442* 287* 104*  103*  ALT 147* 148* 193* 58 57  ALKPHOS 38 48 84 98 87  BILITOT 0.7 0.7 0.9 0.5 0.5  PROT 4.0* 4.1* 4.0* 5.1* 5.0*  ALBUMIN 2.3* 2.2* 1.6* 2.6* 2.4*   No results for input(s): LIPASE, AMYLASE in the last 168 hours. No results for input(s): AMMONIA in the last 168 hours.  CBC:  Recent Labs Lab 09/02/15 0519 09/03/15 0443 09/05/15 0437 09/06/15 0503 09/07/15 0515  WBC 19.5* 19.0* 17.1* 18.3* 19.0*  NEUTROABS  --   --   --  16.2*  --   HGB 9.5* 9.1* 6.8* 7.7* 7.2*  HCT 28.9* 28.0* 21.5* 23.5* 22.3*  MCV 91.9 93.0 91.7 92.9 90.9  PLT 33* 66* 66* 69* 96*    Cardiac Enzymes:  Recent Labs Lab 09/01/15 0435 09/02/15 0519 09/02/15 1419 09/03/15 0443 09/05/15 0727  CKTOTAL 83291* 17243*  --  5397* 2906*  TROPONINI  --   --  0.32* 0.23*  --     BNP: Invalid input(s): POCBNP  CBG:  Recent Labs Lab 09/06/15 0746 09/06/15 1144 09/06/15 1635 09/06/15 2346 09/07/15 0725  GLUCAP 154* 156* 134* 150* 155*    Microbiology: Results for orders placed or performed during the hospital encounter of 08/27/15  Rapid Influenza A&B Antigens ([place] only)     Status: None   Collection Time: 08/27/15 12:22 PM  Result Value Ref Range Status   Influenza A ([place]) NEGATIVE NEGATIVE Final   Influenza B ([facility]) NEGATIVE NEGATIVE Final  Urine culture      Status: None   Collection Time: 08/27/15  7:40 PM  Result Value Ref Range Status   Specimen Description URINE, RANDOM  Final   Special Requests Normal  Final   Culture NO GROWTH 1 DAY  Final   Report Status 08/29/2015 FINAL  Final  MRSA PCR Screening     Status: None   Collection Time: 08/28/15  5:25 PM  Result Value Ref Range Status   MRSA by PCR NEGATIVE NEGATIVE Final    Comment:        The GeneXpert MRSA Assay (FDA approved for NASAL specimens only), is one component of a comprehensive MRSA colonization surveillance program. It is not intended to diagnose MRSA infection nor to guide or monitor treatment for MRSA infections.   Culture, blood (Routine X 2) w Reflex to ID Panel     Status: None   Collection Time: 08/28/15  5:51 PM  Result Value Ref Range Status   Specimen Description BLOOD RIGHT HAND  Final   Special Requests BAA,ANA,AER,5ML  Final   Culture NO GROWTH 5 DAYS  Final   Report Status 09/02/2015 FINAL  Final  Culture, blood (Routine X 2) w Reflex to ID Panel     Status: None   Collection Time: 08/28/15  5:51 PM  Result Value Ref Range Status   Specimen Description BLOOD RIGHT ASSIST CONTROL  Final   Special Requests BAA,ANA,AER,5ML  Final   Culture NO GROWTH 5 DAYS  Final   Report Status 09/02/2015 FINAL  Final  Culture, respiratory (NON-Expectorated)     Status: None   Collection Time: 08/29/15  2:16 PM  Result Value Ref Range Status   Specimen Description TRACHEAL ASPIRATE  Final   Special Requests NONE  Final   Gram Stain FEW WBC SEEN FEW YEAST   Final   Culture Consistent with normal respiratory flora.  Final   Report Status 09/01/2015 FINAL  Final  Culture, respiratory (NON-Expectorated)     Status: None  Collection Time: 08/31/15  3:45 PM  Result Value Ref Range Status   Specimen Description TRACHEAL ASPIRATE  Final   Special Requests NONE  Final   Gram Stain   Final    FEW WBC SEEN MANY GRAM POSITIVE RODS MODERATE GRAM POSITIVE COCCI RARE  YEAST    Culture HEAVY GROWTH ENTEROCOCCUS FAECALIS  Final   Report Status 09/03/2015 FINAL  Final   Organism ID, Bacteria ENTEROCOCCUS FAECALIS  Final      Susceptibility   Enterococcus faecalis - MIC*    AMPICILLIN <=2 SENSITIVE Sensitive     VANCOMYCIN 1 SENSITIVE Sensitive     GENTAMICIN SYNERGY SENSITIVE Sensitive     LINEZOLID 2 SENSITIVE Sensitive     * HEAVY GROWTH ENTEROCOCCUS FAECALIS  Culture, blood (Routine X 2) w Reflex to ID Panel     Status: None   Collection Time: 09/02/15  3:18 PM  Result Value Ref Range Status   Specimen Description BLOOD LINE  Final   Special Requests   Final    BOTTLES DRAWN AEROBIC AND ANAEROBIC  AER 4CC ANA 1CC   Culture NO GROWTH 5 DAYS  Final   Report Status 09/07/2015 FINAL  Final  Culture, blood (Routine X 2) w Reflex to ID Panel     Status: None   Collection Time: 09/02/15  3:20 PM  Result Value Ref Range Status   Specimen Description BLOOD RIGHT ARM  Final   Special Requests BOTTLES DRAWN AEROBIC AND ANAEROBIC  8CC  Final   Culture NO GROWTH 5 DAYS  Final   Report Status 09/07/2015 FINAL  Final  Culture, respiratory (NON-Expectorated)     Status: None   Collection Time: 09/02/15  4:30 PM  Result Value Ref Range Status   Specimen Description TRACHEAL ASPIRATE  Final   Special Requests NONE  Final   Gram Stain   Final    MODERATE WBC SEEN FEW GRAM POSITIVE COCCI IN PAIRS    Culture LIGHT GROWTH STENOTROPHOMONAS MALTOPHILIA  Final   Report Status 09/06/2015 FINAL  Final   Organism ID, Bacteria STENOTROPHOMONAS MALTOPHILIA  Final      Susceptibility   Stenotrophomonas maltophilia - MIC*    LEVOFLOXACIN <=0.12 SENSITIVE Sensitive     TRIMETH/SULFA <=20 SENSITIVE Sensitive     * LIGHT GROWTH STENOTROPHOMONAS MALTOPHILIA  C difficile quick scan w PCR reflex     Status: None   Collection Time: 09/02/15  4:56 PM  Result Value Ref Range Status   C Diff antigen NEGATIVE NEGATIVE Final   C Diff toxin NEGATIVE NEGATIVE Final   C Diff  interpretation Negative for C. difficile  Final    Coagulation Studies: No results for input(s): LABPROT, INR in the last 72 hours.  Urinalysis: No results for input(s): COLORURINE, LABSPEC, PHURINE, GLUCOSEU, HGBUR, BILIRUBINUR, KETONESUR, PROTEINUR, UROBILINOGEN, NITRITE, LEUKOCYTESUR in the last 72 hours.  Invalid input(s): APPERANCEUR    Imaging: Dg Chest Port 1 View  09/07/2015  CLINICAL DATA:  Respiratory failure. EXAM: PORTABLE CHEST 1 VIEW COMPARISON:  September 05, 2015. FINDINGS: Stable cardiomegaly. Endotracheal and nasogastric tubes are unchanged in position. Left internal jugular dialysis catheter is unchanged. No pneumothorax is noted. Stable bilateral lung opacities are noted, with left greater than right. Probable mild left pleural effusion is noted as well. Bony thorax is unremarkable. IMPRESSION: Stable support apparatus. Stable bilateral lung opacities, left greater than right. Electronically Signed   By: Marijo Conception, M.D.   On: 09/07/2015 07:23     Medications:   .  dexmedetomidine Stopped (09/07/15 0554)  . feeding supplement (VITAL 1.5 CAL) 1,000 mL (09/07/15 0133)  . HYDROmorphone 0.75 mg/hr (09/07/15 0856)  . midazolam (VERSED) infusion 0 mg/hr (09/07/15 0857)  . norepinephrine (LEVOPHED) Adult infusion Stopped (09/03/15 1614)  . pureflow 2,000 mL/hr at 09/06/15 0349   . sodium chloride   Intravenous Once  . sodium chloride   Intravenous Once  . antiseptic oral rinse  7 mL Mouth Rinse QID  . budesonide (PULMICORT) nebulizer solution  0.5 mg Nebulization BID  . chlorhexidine gluconate (SAGE KIT)  15 mL Mouth Rinse BID  . diazepam  5 mg Intravenous 4 times per day  . feeding supplement (PRO-STAT SUGAR FREE 64)  30 mL Oral QID  . free water  30 mL Per Tube 6 times per day  . ipratropium-albuterol  3 mL Nebulization Q4H  . methylPREDNISolone (SOLU-MEDROL) injection  40 mg Intravenous Q12H  . pantoprazole sodium  40 mg Per Tube Q1200  . piperacillin-tazobactam  (ZOSYN)  IV  3.375 g Intravenous 3 times per day   acetaminophen **OR** acetaminophen, albuterol, bisacodyl, heparin, hydrALAZINE, midazolam, sodium chloride, sodium chloride flush  Assessment/ Plan:  Mr. BEXLEY MCLESTER is a 50 y.o. white male with no specific past medical history, who was admitted to [facility] on 08/27/2015 + Ibuprofen use prior to admission (600 mg TID x 2 days)  1. Acute Renal Failure, oliguric. Likely secondary to ATN 2. Metabolic Acidosis 3. Rhabdomyolysis with hyperphosphatemia 4. Acute respiratory failure, E faecalis (3/31) 5. Influenza A positive 6. Hypoalbuminemia 7. Anasarca 8. Anemia and Thrombocytopenia (hematology evaluation ongoing) 9. Hyperkalemia.  Plan Patient with anuric to oliguric urine renal failure, anasarca,    d/c albumin now that BP is better Patient tolerated intermittent hemodialysis yesterday. 3 L of fluid was removed Plan for hemodialysis today with UF as tolerated 2K dialysate Family updated   LOS: 11 Daiwik [place] 4/7/201710:22 AM

## 2015-09-07 NOTE — Progress Notes (Signed)
eLink Physician-Brief Progress Note Patient Name: John Hoover Meding DOB: [id number] MRN: [id number]   Date of Service  09/07/2015  HPI/Events of Note  Continued agitation despite dilaudid gtt at 1 mg/hr and versed gtt at 10 mg/hr along with prn versed.  HD stable  eICU Interventions  Plan: Increase dilaudid gtt to 1.5 mg/hr Continue versed gtt with prns     Intervention Category Major Interventions: Delirium, psychosis, severe agitation - evaluation and management  Bernadette Armijo 09/07/2015, 4:56 AM

## 2015-09-08 ENCOUNTER — Inpatient Hospital Stay: Payer: BLUE CROSS/BLUE SHIELD

## 2015-09-08 LAB — BLOOD GAS, ARTERIAL
Acid-Base Excess: 10.4 mmol/L — ABNORMAL HIGH (ref 0.0–3.0)
Allens test (pass/fail): POSITIVE — AB
BICARBONATE: 35 meq/L — AB (ref 21.0–28.0)
FIO2: 0.35
O2 Saturation: 95.7 %
PCO2 ART: 47 mmHg (ref 32.0–48.0)
PEEP: 5 cmH2O
PH ART: 7.48 — AB (ref 7.350–7.450)
PO2 ART: 74 mmHg — AB (ref 83.0–108.0)
Patient temperature: 37
Pressure support: 5 cmH2O

## 2015-09-08 LAB — BASIC METABOLIC PANEL
ANION GAP: 8 (ref 5–15)
BUN: 73 mg/dL — ABNORMAL HIGH (ref 6–20)
CO2: 26 mmol/L (ref 22–32)
Calcium: 7.1 mg/dL — ABNORMAL LOW (ref 8.9–10.3)
Chloride: 98 mmol/L — ABNORMAL LOW (ref 101–111)
Creatinine, Ser: 3.42 mg/dL — ABNORMAL HIGH (ref 0.61–1.24)
GFR, EST AFRICAN AMERICAN: 22 mL/min — AB (ref >60)
GFR, EST NON AFRICAN AMERICAN: 19 mL/min — AB (ref >60)
Glucose, Bld: 150 mg/dL — ABNORMAL HIGH (ref 65–99)
POTASSIUM: 5 mmol/L (ref 3.5–5.1)
SODIUM: 132 mmol/L — AB (ref 135–145)

## 2015-09-08 LAB — CBC
HCT: 21.8 % — ABNORMAL LOW (ref 40.0–52.0)
Hemoglobin: 7 g/dL — ABNORMAL LOW (ref 13.0–18.0)
MCH: 29.3 pg (ref 26.0–34.0)
MCHC: 32 g/dL (ref 32.0–36.0)
MCV: 91.6 fL (ref 80.0–100.0)
PLATELETS: 128 10*3/uL — AB (ref 150–440)
RBC: 2.38 MIL/uL — AB (ref 4.40–5.90)
RDW: 15 % — ABNORMAL HIGH (ref 11.5–14.5)
WBC: 19.1 10*3/uL — AB (ref 3.8–10.6)

## 2015-09-08 LAB — MAGNESIUM: MAGNESIUM: 2.7 mg/dL — AB (ref 1.7–2.4)

## 2015-09-08 LAB — GLUCOSE, CAPILLARY
GLUCOSE-CAPILLARY: 136 mg/dL — AB (ref 65–99)
GLUCOSE-CAPILLARY: 121 mg/dL — AB (ref 65–99)
Glucose-Capillary: 119 mg/dL — ABNORMAL HIGH (ref 65–99)
Glucose-Capillary: 128 mg/dL — ABNORMAL HIGH (ref 65–99)

## 2015-09-08 LAB — PHOSPHORUS: Phosphorus: 6.2 mg/dL — ABNORMAL HIGH (ref 2.5–4.6)

## 2015-09-08 MED ORDER — SULFAMETHOXAZOLE-TRIMETHOPRIM 400-80 MG/5ML IV SOLN
940.0000 mg | Freq: Once | INTRAVENOUS | Status: AC
  Administered 2015-09-08: 940 mg via INTRAVENOUS
  Filled 2015-09-08 (×2): qty 58.8

## 2015-09-08 NOTE — Progress Notes (Signed)
Changed flexiseal bag, bag was marked but was unable to see where output from today was. There was 700 in bag and this was the first time it was changed.

## 2015-09-08 NOTE — Progress Notes (Signed)
Patient agitated.  PRN Versed give.  Wife and son at bedside.

## 2015-09-08 NOTE — Progress Notes (Signed)
[facility]  ROUNDING NOTE   Subjective:  Ultrafiltration achieved yesterday was 1.7 kg. Next line patient remains critically ill and on the ventilator. Patient's wife is still considering transfer to tertiary center. Remains on pressors.    Objective:  Vital signs in last 24 hours:  Temp:  [98.1 F (36.7 C)-100 F (37.8 C)] 99.1 F (37.3 C) (04/08 0900) Pulse Rate:  [87-120] 111 (04/08 0900) Resp:  [12-32] 13 (04/08 0900) BP: (93-149)/(54-85) 141/85 mmHg (04/08 0900) SpO2:  [96 %-100 %] 96 % (04/08 0900) Arterial Line BP: (111-135)/(58-70) 111/58 mmHg (04/07 1300) FiO2 (%):  [35 %] 35 % (04/08 0915) Weight:  [112.63 kg (248 lb 4.9 oz)] 112.63 kg (248 lb 4.9 oz) (04/08 0500)  Weight change: -8.87 kg (-19 lb 8.9 oz) Filed Weights   09/06/15 1830 09/07/15 0454 09/08/15 0500  Weight: 116.3 kg (256 lb 6.3 oz) 114.6 kg (252 lb 10.4 oz) 112.63 kg (248 lb 4.9 oz)    Intake/Output: I/O last 3 completed shifts: In: 2986.2 [I.V.:368.7; NL/GX:2119.4; IV Piggyback:150] Out: 1740 [Urine:65; Other:1755; Stool:1]   Intake/Output this shift:     Physical Exam: General: Critically ill   Head: +ETT +OGT  Eyes: Eyes closed  Neck:  trachea midline, left subclavian triple lumen  Lungs:  Bilateral rhonchi, vent assisted, fio2 35%  Heart: S1S2 no rubs  Abdomen:  Soft, nontender  Extremities: ++edema, +Scrotal edema  Neurologic: Is moving around in bed  Skin: Mottling over toes and fingers of rt hand. Rash over dorsum of feet- improving  Access: Left IJ dialysis cathter (4/2) Dr Alva Garnet    Basic Metabolic Panel:  Recent Labs Lab 09/04/15 0421  09/05/15 8144  09/06/15 0302 09/06/15 0503 09/06/15 8185 09/06/15 1124 09/07/15 0515 09/08/15 0500  NA 137  < >  --   < > 137  --  136 136 135 132*  K 3.7  < >  --   < > 5.0  --  4.9 5.0 5.4* 5.0  CL 106  < >  --   < > 108  --  108 107 104 98*  CO2 27  < >  --   < > 25  --  _0 GLUCOSE 112*  < >  --   < > 163*   --  182* 175* 151* 150*  BUN 67*  < >  --   < > 82*  --  86* 89* 80* 73*  CREATININE 3.31*  < >  --   < > 3.52*  --  3.35* 3.31* 3.31* 3.42*  CALCIUM 7.7*  < >  --   < > 7.7*  --  8.0* 8.0* 7.6* 7.1*  MG 2.3  --  2.5*  --   --  2.6*  --   --  2.5* 2.7*  PHOS 2.4*  --  2.5  --   --  3.4  --   --  4.2 6.2*  < > = values in this interval not displayed.  Liver Function Tests:  Recent Labs Lab 09/03/15 0443 09/06/15 1124 09/07/15 0515  AST 287* 104* 103*  ALT 193* 58 57  ALKPHOS 84 98 87  BILITOT 0.9 0.5 0.5  PROT 4.0* 5.1* 5.0*  ALBUMIN 1.6* 2.6* 2.4*   No results for input(s): LIPASE, AMYLASE in the last 168 hours. No results for input(s): AMMONIA in the last 168 hours.  CBC:  Recent Labs Lab 09/03/15 0443 09/05/15 6314 09/06/15 0503 09/07/15 0515 09/08/15 0848  WBC 19.0* 17.1* 18.3* 19.0* 19.1*  NEUTROABS  --   --  16.2*  --   --   HGB 9.1* 6.8* 7.7* 7.2* 7.0*  HCT 28.0* 21.5* 23.5* 22.3* 21.8*  MCV 93.0 91.7 92.9 90.9 91.6  PLT 66* 66* 69* 96* 128*    Cardiac Enzymes:  Recent Labs Lab 09/02/15 0519 09/02/15 1419 09/03/15 0443 09/05/15 0727  CKTOTAL 42876*  --  5397* 2906*  TROPONINI  --  0.32* 0.23*  --     BNP: Invalid input(s): POCBNP  CBG:  Recent Labs Lab 09/07/15 1110 09/07/15 1624 09/07/15 2323 09/08/15 0730 09/08/15 1107  GLUCAP 152* 122* 113* 136* 128*    Microbiology: Results for orders placed or performed during the hospital encounter of 08/27/15  Rapid Influenza A&B Antigens ([place] only)     Status: None   Collection Time: 08/27/15 12:22 PM  Result Value Ref Range Status   Influenza A ([place]) NEGATIVE NEGATIVE Final   Influenza B ([facility]) NEGATIVE NEGATIVE Final  Urine culture     Status: None   Collection Time: 08/27/15  7:40 PM  Result Value Ref Range Status   Specimen Description URINE, RANDOM  Final   Special Requests Normal  Final   Culture NO GROWTH 1 DAY  Final   Report Status 08/29/2015 FINAL  Final  MRSA PCR Screening      Status: None   Collection Time: 08/28/15  5:25 PM  Result Value Ref Range Status   MRSA by PCR NEGATIVE NEGATIVE Final    Comment:        The GeneXpert MRSA Assay (FDA approved for NASAL specimens only), is one component of a comprehensive MRSA colonization surveillance program. It is not intended to diagnose MRSA infection nor to guide or monitor treatment for MRSA infections.   Culture, blood (Routine X 2) w Reflex to ID Panel     Status: None   Collection Time: 08/28/15  5:51 PM  Result Value Ref Range Status   Specimen Description BLOOD RIGHT HAND  Final   Special Requests BAA,ANA,AER,5ML  Final   Culture NO GROWTH 5 DAYS  Final   Report Status 09/02/2015 FINAL  Final  Culture, blood (Routine X 2) w Reflex to ID Panel     Status: None   Collection Time: 08/28/15  5:51 PM  Result Value Ref Range Status   Specimen Description BLOOD RIGHT ASSIST CONTROL  Final   Special Requests BAA,ANA,AER,5ML  Final   Culture NO GROWTH 5 DAYS  Final   Report Status 09/02/2015 FINAL  Final  Culture, respiratory (NON-Expectorated)     Status: None   Collection Time: 08/29/15  2:16 PM  Result Value Ref Range Status   Specimen Description TRACHEAL ASPIRATE  Final   Special Requests NONE  Final   Gram Stain FEW WBC SEEN FEW YEAST   Final   Culture Consistent with normal respiratory flora.  Final   Report Status 09/01/2015 FINAL  Final  Culture, respiratory (NON-Expectorated)     Status: None   Collection Time: 08/31/15  3:45 PM  Result Value Ref Range Status   Specimen Description TRACHEAL ASPIRATE  Final   Special Requests NONE  Final   Gram Stain   Final    FEW WBC SEEN MANY GRAM POSITIVE RODS MODERATE GRAM POSITIVE COCCI RARE YEAST    Culture HEAVY GROWTH ENTEROCOCCUS FAECALIS  Final   Report Status 09/03/2015 FINAL  Final   Organism ID, Bacteria ENTEROCOCCUS FAECALIS  Final  Susceptibility   Enterococcus faecalis - MIC*    AMPICILLIN <=2 SENSITIVE Sensitive      VANCOMYCIN 1 SENSITIVE Sensitive     GENTAMICIN SYNERGY SENSITIVE Sensitive     LINEZOLID 2 SENSITIVE Sensitive     * HEAVY GROWTH ENTEROCOCCUS FAECALIS  Culture, blood (Routine X 2) w Reflex to ID Panel     Status: None   Collection Time: 09/02/15  3:18 PM  Result Value Ref Range Status   Specimen Description BLOOD LINE  Final   Special Requests   Final    BOTTLES DRAWN AEROBIC AND ANAEROBIC  AER 4CC ANA 1CC   Culture NO GROWTH 5 DAYS  Final   Report Status 09/07/2015 FINAL  Final  Culture, blood (Routine X 2) w Reflex to ID Panel     Status: None   Collection Time: 09/02/15  3:20 PM  Result Value Ref Range Status   Specimen Description BLOOD RIGHT ARM  Final   Special Requests BOTTLES DRAWN AEROBIC AND ANAEROBIC  8CC  Final   Culture NO GROWTH 5 DAYS  Final   Report Status 09/07/2015 FINAL  Final  Culture, respiratory (NON-Expectorated)     Status: None   Collection Time: 09/02/15  4:30 PM  Result Value Ref Range Status   Specimen Description TRACHEAL ASPIRATE  Final   Special Requests NONE  Final   Gram Stain   Final    MODERATE WBC SEEN FEW GRAM POSITIVE COCCI IN PAIRS    Culture LIGHT GROWTH STENOTROPHOMONAS [place]  Final   Report Status 09/06/2015 FINAL  Final   Organism ID, Bacteria STENOTROPHOMONAS MALTOPHILIA  Final      Susceptibility   Stenotrophomonas maltophilia - MIC*    LEVOFLOXACIN <=0.12 SENSITIVE Sensitive     TRIMETH/SULFA <=20 SENSITIVE Sensitive     * LIGHT GROWTH STENOTROPHOMONAS MALTOPHILIA  C difficile quick scan w PCR reflex     Status: None   Collection Time: 09/02/15  4:56 PM  Result Value Ref Range Status   C Diff antigen NEGATIVE NEGATIVE Final   C Diff toxin NEGATIVE NEGATIVE Final   C Diff interpretation Negative for C. difficile  Final    Coagulation Studies: No results for input(s): LABPROT, INR in the last 72 hours.  Urinalysis: No results for input(s): COLORURINE, LABSPEC, PHURINE, GLUCOSEU, HGBUR, BILIRUBINUR, KETONESUR,  PROTEINUR, UROBILINOGEN, NITRITE, LEUKOCYTESUR in the last 72 hours.  Invalid input(s): APPERANCEUR    Imaging: US Venous Img Upper Uni Right  09/07/2015  CLINICAL DATA:  Right upper extremity swelling and pain for 10 days EXAM: RIGHT UPPER EXTREMITY VENOUS DOPPLER ULTRASOUND TECHNIQUE: Gray-scale sonography with graded compression, as well as color Doppler and duplex ultrasound were performed to evaluate the upper extremity deep venous system from the level of the subclavian vein and including the jugular, axillary, basilic, radial, ulnar and upper cephalic vein. Spectral Doppler was utilized to evaluate flow at rest and with distal augmentation maneuvers. COMPARISON:  None. FINDINGS: Contralateral Subclavian Vein: Respiratory phasicity is normal and symmetric with the symptomatic side. No evidence of thrombus. Normal compressibility. Internal Jugular Vein: No evidence of thrombus. Normal compressibility, respiratory phasicity and response to augmentation. Subclavian Vein: No evidence of thrombus. Normal compressibility, respiratory phasicity and response to augmentation. Axillary Vein: No evidence of thrombus. Normal compressibility, respiratory phasicity and response to augmentation. Cephalic Vein: No evidence of thrombus. Normal compressibility, respiratory phasicity and response to augmentation. Basilic Vein: No evidence of thrombus. Normal compressibility, respiratory phasicity and response to augmentation. Brachial Veins: No evidence  of thrombus. Normal compressibility, respiratory phasicity and response to augmentation. Radial Veins: No evidence of thrombus. Normal compressibility, respiratory phasicity and response to augmentation. Ulnar Veins: No evidence of thrombus. Normal compressibility, respiratory phasicity and response to augmentation. Venous Reflux:  None visualized. Other Findings:  None visualized. IMPRESSION: No evidence of deep venous thrombosis. Electronically Signed   By: Jerilynn Mages.  Shick M.D.    On: 09/07/2015 15:35   Dg Chest Port 1 View  09/08/2015  CLINICAL DATA:  50 year old male with a history of acute respiratory failure. EXAM: PORTABLE CHEST 1 VIEW COMPARISON:  09/07/2015, 09/05/2015 FINDINGS: Cardiomediastinal silhouette unchanged, though partially obscured by overlying lung and pleural disease. Endotracheal tube unchanged, terminating 3.3 cm above the carina. Left IJ central catheter unchanged, terminating in the superior vena cava. Unchanged left subclavian central line, terminating in the superior vena cava. Gastric tube unchanged, terminating out of the field of view. No pneumothorax. Bilateral mixed interstitial and airspace opacities, slightly improved on the left. IMPRESSION: Similar appearance the chest x-ray with mild improvement aeration on the left. Mixed interstitial and airspace opacities may reflect persisting edema, atelectasis, and/ or consolidation. Likely persisting left pleural effusion. Unchanged support apparatus as above. Signed, Dulcy Fanny. Earleen Newport, DO [facility] Radiology Electronically Signed   By: Corrie Mckusick D.O.   On: 09/08/2015 08:54   Dg Chest Port 1 View  09/07/2015  CLINICAL DATA:  Respiratory failure. EXAM: PORTABLE CHEST 1 VIEW COMPARISON:  September 05, 2015. FINDINGS: Stable cardiomegaly. Endotracheal and nasogastric tubes are unchanged in position. Left internal jugular dialysis catheter is unchanged. No pneumothorax is noted. Stable bilateral lung opacities are noted, with left greater than right. Probable mild left pleural effusion is noted as well. Bony thorax is unremarkable. IMPRESSION: Stable support apparatus. Stable bilateral lung opacities, left greater than right. Electronically Signed   By: Marijo Conception, M.D.   On: 09/07/2015 07:23     Medications:   . dexmedetomidine Stopped (09/07/15 0554)  . feeding supplement (VITAL 1.5 CAL) 1,000 mL (09/08/15 0048)  . HYDROmorphone 1.5 mg/hr (09/07/15  1800)  . midazolam (VERSED) infusion 10 mg/hr (09/08/15 0123)  . norepinephrine (LEVOPHED) Adult infusion Stopped (09/03/15 1614)   . sodium chloride   Intravenous Once  . sodium chloride   Intravenous Once  . antiseptic oral rinse  7 mL Mouth Rinse QID  . budesonide (PULMICORT) nebulizer solution  0.5 mg Nebulization BID  . chlorhexidine gluconate (SAGE KIT)  15 mL Mouth Rinse BID  . diazepam  5 mg Intravenous 4 times per day  . feeding supplement (PRO-STAT SUGAR FREE 64)  30 mL Oral 6 times per day  . free water  30 mL Per Tube 6 times per day  . ipratropium-albuterol  3 mL Nebulization Q4H  . methylPREDNISolone (SOLU-MEDROL) injection  40 mg Intravenous Q12H  . oxyCODONE  2.5 mg Oral 4 times per day  . pantoprazole sodium  40 mg Per Tube Q1200  . piperacillin-tazobactam (ZOSYN)  IV  3.375 g Intravenous Q12H  . sulfamethoxazole-trimethoprim  940 mg of trimethoprim Intravenous Once in dialysis   acetaminophen **OR** acetaminophen, albuterol, bisacodyl, heparin, hydrALAZINE, midazolam, sodium chloride, sodium chloride flush  Assessment/ Plan:  John Hoover is a 50 y.o. white male with no specific past medical history, who was admitted to [facility] on 08/27/2015 + Ibuprofen use prior to admission (600 mg TID x 2 days)  1. Acute Renal Failure, oliguric. Likely secondary to ATN 2. Metabolic Acidosis 3. Rhabdomyolysis with  hyperphosphatemia 4. Acute respiratory failure, E faecalis (3/31) 5. Influenza A positive 6. Hypoalbuminemia 7. Anasarca/Generalized edema 8. Anemia and Thrombocytopenia (hematology evaluation ongoing) 9. Hyperkalemia.  Plan Patient remains oliguric at this point in time. Therefore we will proceed with renal replacement therapy in the form of intermittent hemodialysis today.  Ultrafiltration target is 2 kg today. He remains critically ill at this point in time. Family is still considering transfer to [facility].  Continue ventilatory support as well as pressor support to  maintain a map of 65 or greater. We will use a 2 potassium bath again during dialysis today. We will monitor for need for renal placement therapy daily.   LOS: 12 Geraldy Akridge 4/8/201711:15 AM

## 2015-09-08 NOTE — Progress Notes (Signed)
Place patient back on prvc rate 20 peep 5 35%. Patient agitated at this time

## 2015-09-08 NOTE — Progress Notes (Signed)
PULMONARY / CRITICAL CARE MEDICINE   Name: John Hoover MRN: [id number] DOB: Feb 08, 1966    ADMISSION DATE:  08/27/2015 CONSULTATION DATE:  08/28/15    PT PROFILE: John Hoover previously healthy [age] M admitted to gen med floor with several days of malaise and profound myalgias. Influenza PCR positive. On admission was noted to be hemoconcentrated with AKI and elevated CPK. On 03/28 developed progressive AMS and found to have profound metabolic acidosis. Transferred to ICU and PCCM consulted for management of acidosis, rhabdomyolysis, MODS, shock. Intubated, CVL placed and HD cath placed on arrival to ICU. Empiric abx initiated  SUBJECTIVE: Moderate agitation overnight, requiring sedation/analgesia  MAJOR EVENTS/TEST RESULTS: 03/27 Admitted as above 03/28 transferred to ICU, intubated, CVL placed, HD cath placed, vasopressors initiated, Renal consult, CRRT initiated, HCO3 gtt initiated, empiric abx initated 03/28 CT head: NAD 03/28 Echocardiogram: LVEF 60-65%, grade one diastolic dysfunction 03/28 CTAP: no acute findings 03/30 BLE venous US: no DVT 03/31 Acidosis much improved. CRRT stopped, HCO3 gtt stopped 03/31 Transition off midaz infusion to dexmedetomidine 04/01 CRRT resumed. Back on low dose fentanyl infusion. Failed SBT. Trial furosemide with minimal response 04/02 Increasing agitation. Dexmedetomidine transitioned to propofol. R femoral HD cath not functioning and replaced after platelet transfusion. CXR pattern c/w edema, severe LE and scrotal edema - volume removal via CRRT 04/02 Worsening gas exchange requiring increased vent support and heavier sedation. HD cath required replacement. Platelets transfused prior to placement. Wife apprised several times over course of day 4/3 remains on CRRT very low UO 4/5 patient failed SAT/SBT due to resp muscle fatigue 4/6 failed SAT/SBT again, Stenotrophomonas species  4/7 off pressors.  4/8 failed SAT/SBT again, Stenotrophomonas species    INDWELLING DEVICES:: ETT 03/28 >>  L [place] CVL 03/28 >>  R femoral HD cath 03/28 >> 04/02 R femoral A-line 03/28 >>  R IJ HD cath 04/02 >>   MICRO DATA: Flu PCR 03/27 >> positive for A, neg for H1N1 MRSA PCR 03/28 >> NEG Urine 03/27 >> NEG Blood 03/28 >> NEG Resp 03/29 >> NOF HIV 03/31 >> NEG Resp 03/31 >>enterococcus faecalis C diff 04/02 >> NEG Resp 04/02 >> stenatrophomonas species   ANTIMICROBIALS:  Oseltamivir 03/27 >> 04/01 Vanc 03/28 >> 03/31 Cefepime 03/28 >>  Bactrim 4/6>>>>   SUBJECTIVE:  RASS -3 to +1. Intermittently F/C Remains intubated, failed sat/sbt  Today, moderate to severe agitation with desats fio2 at 35% PEEP at 5, on iHD, off vasopressors this AM Failed multiple wean attempts   VITAL SIGNS: BP 141/85 mmHg  Pulse 111  Temp(Src) 99.1 F (37.3 C) (Axillary)  Resp 13  Ht 6\' 1"  (1.854 m)  Wt 248 lb 4.9 oz (112.63 kg)  BMI 32.77 kg/m2  SpO2 96%  HEMODYNAMICS: CVP:  [14 mmHg-24 mmHg] 24 mmHg  VENTILATOR SETTINGS: Vent Mode:  [-] PRVC FiO2 (%):  [35 %] 35 % Set Rate:  [20 bmp-25 bmp] 20 bmp Vt Set:  [600 mL] 600 mL PEEP:  [5 cmH20] 5 cmH20 Plateau Pressure:  [18 cmH20-19 cmH20] 19 cmH20  INTAKE / OUTPUT: I/O last 3 completed shifts: In: 2986.2 [I.V.:368.7; NG/GT:2467.5; IV Piggyback:150] Out: 1821 [Urine:65; Other:1755; Stool:1]  PHYSICAL EXAMINATION: General: RASS -2, WDWN Neuro: PERRL, EOMI, MAEs, DTRs symmetric HEENT: NCAT, WNL Cardiovascular: Regular, no murmurs  Lungs: diffuse rhonchi, dependent crackles Abdomen: soft, NT, ND, diminished BS Ext: warm, + cyanosis, increasing LE edema GU: severe scrotal and penile edema  LABS:  BMET  Recent Labs Lab 09/06/15 1124 09/07/15 0515 09/08/15 0500  NA 136 135 132*  K 5.0 5.4* 5.0  CL 107 104 98*  CO2 BUN 89* 80* 73*  CREATININE 3.31* 3.31* 3.42*  GLUCOSE 175* 151* 150*    Electrolytes  Recent Labs Lab 09/06/15 0503  09/06/15 1124 09/07/15 0515  09/08/15 0500  CALCIUM  --   < > 8.0* 7.6* 7.1*  MG 2.6*  --   --  2.5* 2.7*  PHOS 3.4  --   --  4.2 6.2*  < > = values in this interval not displayed.  CBC  Recent Labs Lab 09/06/15 0503 09/07/15 0515 09/08/15 0848  WBC 18.3* 19.0* 19.1*  HGB 7.7* 7.2* 7.0*  HCT 23.5* 22.3* 21.8*  PLT 69* 96* 128*    Coag's No results for input(s): APTT, INR in the last 168 hours.  Sepsis Markers  Recent Labs Lab 09/02/15 0519 09/03/15 0443  PROCALCITON 2.12 32.90    ABG  Recent Labs Lab 09/04/15 0815 09/06/15 1100 09/07/15 1100  PHART 7.42 7.37 7.44  PCO2ART 46 45 39  PO2ART 86 71* 64*    Liver Enzymes  Recent Labs Lab 09/03/15 0443 09/06/15 1124 09/07/15 0515  AST 287* 104* 103*  ALT 193* 58 57  ALKPHOS 84 98 87  BILITOT 0.9 0.5 0.5  ALBUMIN 1.6* 2.6* 2.4*    Cardiac Enzymes  Recent Labs Lab 09/02/15 1419 09/03/15 0443  TROPONINI 0.32* 0.23*    Glucose  Recent Labs Lab 09/07/15 0725 09/07/15 1110 09/07/15 1624 09/07/15 2323 09/08/15 0730 09/08/15 1107  GLUCAP 155* 152* 122* 113* 136* 128*    CXR: increasing edema pattern   ASSESSMENT / PLAN: 50 yo white male with acute hypoxic resp failrue from acute INF A pneumonia with superimposed bacterial pneumonia complicated by acute rhabdo with acute and severe acidosis with ARF  PULMONARY A: Acute hypoxic resp failure-showing signs of failure to wean from vent Pulmonary edema Suspect VAP P:   Cont vent support - settings reviewed and/or adjusted Cont vent bundle Increase volume removal  -will need aggressive vent support  -wean fio2 and PEEP as tolerated -will likely need trach and PEG tube  CARDIOVASCULAR A:  Shock, recurrent P:  MAP goal > 65 mmHg    RENAL A:   AKI - likely ATN + NSAIDS Severe lactic acidosis, resolved Severe hypervolemia P:   Monitor BMET intermittently Monitor I/Os Correct electrolytes as indicated Renal service following - now on  iHD  GASTROINTESTINAL A:   Heme + stools P:   SUP: enteral PPI Cont TFs  HEMATOLOGIC A:   Polycythemia, resolved Mild anemia, acute blood loss - hemodynamically stable Severe thrombocytopenia  P:  DVT px: SCDs Monitor CBC intermittently Transfuse per usual guidelines Platelet transfusion 04/02 prior to HD cath placement  INFECTIOUS A:   Severe sepsis P:   Monitor temp, WBC count Micro and abx as above  ENDOCRINE A:   Mild stress induced hyperglycemia Risk of hypoglycemia in setting of AKI P:   DC SSI CBG q 8 hrs Consider resuming SSI for glu > 180  NEUROLOGIC A:   Acute ICU acquired encephalopathy ICU/vent associated discomfort P:   RASS goal: -2, -3 Cont dilaudid infusion Cont midazolam infusion Scheduled valium 5 mg QID Scheduled oxycodone 2.5mg  TID  I have personally obtained a history, examined the patient, evaluated Pertinent laboratory and [employer]/imaging results, and  formulated the assessment and plan   The Patient requires high complexity decision making for assessment and support, frequent evaluation and titration of therapies,  application of advanced monitoring technologies and extensive interpretation of multiple databases. Critical Care Time devoted to patient care services described in this note is 35 minutes.   Overall, patient is critically ill, prognosis is guarded.  Patient with Multiorgan failure and at high risk for cardiac arrest and death.   Dr. Belia Heman has spoke with both [facility], where the patient has been accepted to both, currently awaiting bed placement at either facility.    Stephanie Acre, MD [facility] Pager [phone] (please enter 7-digits) On Call Pager - [phone] (please enter 7-digits)

## 2015-09-08 NOTE — Progress Notes (Signed)
RT placed patient in SBT 5/5 per verbal order from Dr. Dema SeverinMungal.  Patient tolerating well at this time, will continue to monitor.

## 2015-09-08 NOTE — Progress Notes (Signed)
MEDICATION RELATED CONSULT NOTE - FOLLOW UP   Pharmacy Consult for Constipation Prevention   No Known Allergies  Patient Measurements: Height: 6' 1"  (185.4 cm) Weight: 248 lb 4.9 oz (112.63 kg) IBW/kg (Calculated) : 79.9  Vital Signs: Temp: 99.1 F (37.3 C) (04/08 0900) BP: 141/85 mmHg (04/08 0900) Pulse Rate: 111 (04/08 0900) Intake/Output from previous day: 04/07 0701 - 04/08 0700 In: 1587.9 [I.V.:223.4; NG/GT:1314.5; IV Piggyback:50] Out: 6226 [Urine:40; Stool:1] Intake/Output from this shift:    Labs:  Recent Labs  09/06/15 0503  09/06/15 1124 09/07/15 0515 09/08/15 0500 09/08/15 0848  WBC 18.3*  --   --  19.0*  --  19.1*  HGB 7.7*  --   --  7.2*  --  7.0*  HCT 23.5*  --   --  22.3*  --  21.8*  PLT 69*  --   --  96*  --  128*  CREATININE  --   < > 3.31* 3.31* 3.42*  --   MG 2.6*  --   --  2.5* 2.7*  --   PHOS 3.4  --   --  4.2 6.2*  --   ALBUMIN  --   --  2.6* 2.4*  --   --   PROT  --   --  5.1* 5.0*  --   --   AST  --   --  104* 103*  --   --   ALT  --   --  58 57  --   --   ALKPHOS  --   --  98 87  --   --   BILITOT  --   --  0.5 0.5  --   --   < > = values in this interval not displayed. Estimated Creatinine Clearance: 33.6 mL/min (by C-G formula based on Cr of 3.42).    Medical History: Past Medical History  Diagnosis Date  . Meniere's disease     Medications:  Scheduled:  . sodium chloride   Intravenous Once  . sodium chloride   Intravenous Once  . antiseptic oral rinse  7 mL Mouth Rinse QID  . budesonide (PULMICORT) nebulizer solution  0.5 mg Nebulization BID  . chlorhexidine gluconate (SAGE KIT)  15 mL Mouth Rinse BID  . diazepam  5 mg Intravenous 4 times per day  . feeding supplement (PRO-STAT SUGAR FREE 64)  30 mL Oral 6 times per day  . free water  30 mL Per Tube 6 times per day  . ipratropium-albuterol  3 mL Nebulization Q4H  . methylPREDNISolone (SOLU-MEDROL) injection  40 mg Intravenous Q12H  . oxyCODONE  2.5 mg Oral 4 times per  day  . pantoprazole sodium  40 mg Per Tube Q1200  . piperacillin-tazobactam (ZOSYN)  IV  3.375 g Intravenous Q12H  . sulfamethoxazole-trimethoprim  940 mg Intravenous Once   Infusions:  . dexmedetomidine Stopped (09/07/15 0554)  . feeding supplement (VITAL 1.5 CAL) 1,000 mL (09/08/15 0048)  . HYDROmorphone 1.5 mg/hr (09/07/15 1800)  . midazolam (VERSED) infusion 10 mg/hr (09/08/15 0123)  . norepinephrine (LEVOPHED) Adult infusion Stopped (09/03/15 1614)    Assessment: Pharmacy consulted to assist in adjusting medications for constipation prevention in this 50 y/o M with rhabdomyolysis.   Plan:   Patient with BM 4/5. Will continue senna-docusate 1 tab po PT bid.   4/8: Per Norm Parcel MD note on 4/6- patient with Flexiseal for diarrhea- MD d/c senokot.  Pharmacy will continue to monitor and adjust per consult.  Sigifredo Pignato A 09/08/2015,1:29 PM

## 2015-09-08 NOTE — Progress Notes (Signed)
Pharmacy Antibiotic Note  John Hoover is a 50 y.o. male admitted on 08/27/2015 with pneumonia. 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 Pharmacy consulted for zosyn and bactrim.  Plan: Zosyn 3.375gm E IV q12hrs due to HD  4/8-Based on adjusted BW of 94kg and dosing recommendation of 10mg /kg of trimethoprim, will give BACTRIM with 940mg  of trimethoprim IV x1 after dialysis today. (Dose ordered for 4/7 was not charted as given, but RN did not find bag in CCU?).  Will need to reassess dialysis schedule tomorrow with nephrology, as patient is new to dialysis and hasn't gotten a set schedule yet.  Height: 6\' 1"  (185.4 cm) Weight: 248 lb 4.9 oz (112.63 kg) IBW/kg (Calculated) : 79.9  Temp (24hrs), Avg:98.7 F (37.1 C), Min:98.1 F (36.7 C), Max:99.1 F (37.3 C)   Recent Labs Lab 09/03/15 0443  09/05/15 0437  09/06/15 0302 09/06/15 0503 09/06/15 0938 09/06/15 1124 09/07/15 0515 09/08/15 0500 09/08/15 0848  WBC 19.0*  --  17.1*  --   --  18.3*  --   --  19.0*  --  19.1*  CREATININE 3.28*  < >  --   < > 3.52*  --  3.35* 3.31* 3.31* 3.42*  --   < > = values in this interval not displayed.  Estimated Creatinine Clearance: 33.6 mL/min (by C-G formula based on Cr of 3.42).    No Known Allergies  Antimicrobials this admission: Anti-infectives    Start     Dose/Rate Route Frequency Ordered Stop   09/08/15 1800  sulfamethoxazole-trimethoprim (BACTRIM) 940 mg in dextrose 5 % 500 mL IVPB     940 mg 372.5 mL/hr over 90 Minutes Intravenous  Once 09/08/15 1318     09/07/15 2200  piperacillin-tazobactam (ZOSYN) IVPB 3.375 g     3.375 g 12.5 mL/hr over 240 Minutes Intravenous Every 12 hours 09/07/15 1322     09/07/15 1330  sulfamethoxazole-trimethoprim (BACTRIM) 940 mg of trimethoprim in dextrose 5 % 500 mL IVPB  Status:  Discontinued     940 mg of trimethoprim 372.5 mL/hr over 90 Minutes Intravenous  Once  09/07/15 1318 09/07/15 1324   09/07/15 1330  sulfamethoxazole-trimethoprim (BACTRIM) 940 mg of trimethoprim in dextrose 5 % 500 mL IVPB  Status:  Discontinued     940 mg of trimethoprim 372.5 mL/hr over 90 Minutes Intravenous Once in dialysis 09/07/15 1324 09/08/15 1320   09/06/15 1900  sulfamethoxazole-trimethoprim (BACTRIM) 940 mg of trimethoprim in dextrose 5 % 500 mL IVPB     940 mg of trimethoprim 372.5 mL/hr over 90 Minutes Intravenous  Once 09/06/15 1618 09/06/15 2010   09/05/15 1200  piperacillin-tazobactam (ZOSYN) IVPB 3.375 g  Status:  Discontinued     3.375 g 12.5 mL/hr over 240 Minutes Intravenous 3 times per day 09/05/15 1108 09/07/15 1322   09/04/15 1515  ampicillin (OMNIPEN) 2 g in sodium chloride 0.9 % 50 mL IVPB  Status:  Discontinued     2 g 150 mL/hr over 20 Minutes Intravenous 3 times per day 09/04/15 1511 09/05/15 1107   09/03/15 2200  ceFAZolin (ANCEF) IVPB 2g/100 mL premix  Status:  Discontinued     2 g 200 mL/hr over 30 Minutes Intravenous Every 12 hours 09/03/15 1251 09/04/15 1511   09/03/15 1300  ceFAZolin (ANCEF) IVPB 2g/100 mL premix  Status:  Discontinued     2 g 200 mL/hr over 30 Minutes Intravenous Every 12 hours 09/03/15 1250 09/03/15 1251   09/03/15  0030  vancomycin (VANCOCIN) IVPB 750 mg/150 ml premix  Status:  Discontinued     750 mg 150 mL/hr over 60 Minutes Intravenous Every 24 hours 09/02/15 1454 09/03/15 1124   09/02/15 1530  vancomycin (VANCOCIN) IVPB 750 mg/150 ml premix     750 mg 150 mL/hr over 60 Minutes Intravenous  Once 09/02/15 1454 09/02/15 1651   09/02/15 1445  vancomycin (VANCOCIN) 50 mg/mL oral solution 125 mg  Status:  Discontinued     125 mg Per Tube 4 times per day 09/02/15 1433 09/03/15 0052   08/28/15 2200  ceFEPIme (MAXIPIME) 2 g in dextrose 5 % 50 mL IVPB  Status:  Discontinued     2 g 100 mL/hr over 30 Minutes Intravenous Every 12 hours 08/28/15 1726 09/03/15 1124   08/28/15 2200  oseltamivir (TAMIFLU) 6 MG/ML suspension 30 mg   Status:  Discontinued     30 mg Per Tube 2 times daily 08/28/15 1741 08/28/15 1855   08/28/15 2000  vancomycin (VANCOCIN) IVPB 1000 mg/200 mL premix  Status:  Discontinued     1,000 mg 200 mL/hr over 60 Minutes Intravenous Every 24 hours 08/28/15 1838 08/30/15 1637   08/28/15 1900  oseltamivir (TAMIFLU) 6 MG/ML suspension 30 mg  Status:  Discontinued     30 mg Per Tube Daily 08/28/15 1855 09/01/15 1056   08/28/15 1745  vancomycin (VANCOCIN) 1,250 mg in sodium chloride 0.9 % 250 mL IVPB  Status:  Discontinued     1,250 mg 166.7 mL/hr over 90 Minutes Intravenous STAT 08/28/15 1730 08/28/15 1837   08/27/15 2200  oseltamivir (TAMIFLU) capsule 75 mg  Status:  Discontinued     75 mg Oral 2 times daily 08/27/15 1806 08/27/15 1814   08/27/15 1900  cefTRIAXone (ROCEPHIN) 1 g in dextrose 5 % 50 mL IVPB  Status:  Discontinued     1 g 100 mL/hr over 30 Minutes Intravenous Every 24 hours 08/27/15 1806 08/28/15 1420      Microbiology results: Results for orders placed or performed during the hospital encounter of 08/27/15  Rapid Influenza A&B Antigens ([facility] only)     Status: None   Collection Time: 08/27/15 12:22 PM  Result Value Ref Range Status   Influenza A ([facility]) NEGATIVE NEGATIVE Final   Influenza B ([facility]) NEGATIVE NEGATIVE Final  Urine culture     Status: None   Collection Time: 08/27/15  7:40 PM  Result Value Ref Range Status   Specimen Description URINE, RANDOM  Final   Special Requests Normal  Final   Culture NO GROWTH 1 DAY  Final   Report Status 08/29/2015 FINAL  Final  MRSA PCR Screening     Status: None   Collection Time: 08/28/15  5:25 PM  Result Value Ref Range Status   MRSA by PCR NEGATIVE NEGATIVE Final    Comment:        The GeneXpert MRSA Assay (FDA approved for NASAL specimens only), is one component of a comprehensive MRSA colonization surveillance program. It is not intended to diagnose MRSA infection nor to guide or monitor treatment for MRSA infections.    Culture, blood (Routine X 2) w Reflex to ID Panel     Status: None   Collection Time: 08/28/15  5:51 PM  Result Value Ref Range Status   Specimen Description BLOOD RIGHT HAND  Final   Special Requests [facility]  Final   Culture NO GROWTH 5 DAYS  Final   Report Status 09/02/2015 FINAL  Final  Culture, blood (  Routine X 2) w Reflex to ID Panel     Status: None   Collection Time: 08/28/15  5:51 PM  Result Value Ref Range Status   Specimen Description BLOOD RIGHT ASSIST CONTROL  Final   Special Requests BAA,ANA,AER,5ML  Final   Culture NO GROWTH 5 DAYS  Final   Report Status 09/02/2015 FINAL  Final  Culture, respiratory (NON-Expectorated)     Status: None   Collection Time: 08/29/15  2:16 PM  Result Value Ref Range Status   Specimen Description TRACHEAL ASPIRATE  Final   Special Requests NONE  Final   Gram Stain FEW WBC SEEN FEW YEAST   Final   Culture Consistent with normal respiratory flora.  Final   Report Status 09/01/2015 FINAL  Final  Culture, respiratory (NON-Expectorated)     Status: None   Collection Time: 08/31/15  3:45 PM  Result Value Ref Range Status   Specimen Description TRACHEAL ASPIRATE  Final   Special Requests NONE  Final   Gram Stain   Final    FEW WBC SEEN MANY GRAM POSITIVE RODS MODERATE GRAM POSITIVE COCCI RARE YEAST    Culture HEAVY GROWTH ENTEROCOCCUS FAECALIS  Final   Report Status 09/03/2015 FINAL  Final   Organism ID, Bacteria ENTEROCOCCUS FAECALIS  Final      Susceptibility   Enterococcus faecalis - MIC*    AMPICILLIN <=2 SENSITIVE Sensitive     VANCOMYCIN 1 SENSITIVE Sensitive     GENTAMICIN SYNERGY SENSITIVE Sensitive     LINEZOLID 2 SENSITIVE Sensitive     * HEAVY GROWTH ENTEROCOCCUS FAECALIS  Culture, blood (Routine X 2) w Reflex to ID Panel     Status: None   Collection Time: 09/02/15  3:18 PM  Result Value Ref Range Status   Specimen Description BLOOD LINE  Final   Special Requests   Final    BOTTLES DRAWN AEROBIC AND ANAEROBIC   AER 4CC ANA 1CC   Culture NO GROWTH 5 DAYS  Final   Report Status 09/07/2015 FINAL  Final  Culture, blood (Routine X 2) w Reflex to ID Panel     Status: None   Collection Time: 09/02/15  3:20 PM  Result Value Ref Range Status   Specimen Description BLOOD RIGHT ARM  Final   Special Requests BOTTLES DRAWN AEROBIC AND ANAEROBIC  8CC  Final   Culture NO GROWTH 5 DAYS  Final   Report Status 09/07/2015 FINAL  Final  Culture, respiratory (NON-Expectorated)     Status: None   Collection Time: 09/02/15  4:30 PM  Result Value Ref Range Status   Specimen Description TRACHEAL ASPIRATE  Final   Special Requests NONE  Final   Gram Stain   Final    MODERATE WBC SEEN FEW GRAM POSITIVE COCCI IN PAIRS    Culture LIGHT GROWTH STENOTROPHOMONAS MALTOPHILIA  Final   Report Status 09/06/2015 FINAL  Final   Organism ID, Bacteria STENOTROPHOMONAS MALTOPHILIA  Final      Susceptibility   Stenotrophomonas maltophilia - MIC*    LEVOFLOXACIN <=0.12 SENSITIVE Sensitive     TRIMETH/SULFA <=20 SENSITIVE Sensitive     * LIGHT GROWTH STENOTROPHOMONAS MALTOPHILIA  C difficile quick scan w PCR reflex     Status: None   Collection Time: 09/02/15  4:56 PM  Result Value Ref Range Status   C Diff antigen NEGATIVE NEGATIVE Final   C Diff toxin NEGATIVE NEGATIVE Final   C Diff interpretation Negative for C. difficile  Final    Thank you  for allowing pharmacy to be a part of this patient's care.  Jenya Putz A 09/08/2015 1:32 PM

## 2015-09-08 NOTE — Progress Notes (Signed)
ABG drawn from right radial artery due to A-line malfunctioning per Madelaine BhatAdam, RN

## 2015-09-08 NOTE — Progress Notes (Signed)
Patient calm.  Wife remains at bedside.

## 2015-09-08 NOTE — Progress Notes (Signed)
Patient continues to be very agitated.  Thrashing arms around, throwing feet off bed.  Precedex drip restarted.

## 2015-09-08 NOTE — Progress Notes (Signed)
Pt remains intubated FiO2 at 35%, pt rested throughout night. Tolerating tube feedings; NSR on cardiac monitor; pts family visited and updated. UOP 40 cc. [facility] called and stated they still did not have a bed.

## 2015-09-09 ENCOUNTER — Inpatient Hospital Stay: Payer: BLUE CROSS/BLUE SHIELD

## 2015-09-09 LAB — PHOSPHORUS: Phosphorus: 8.1 mg/dL — ABNORMAL HIGH (ref 2.5–4.6)

## 2015-09-09 LAB — CBC
HCT: 19.2 % — ABNORMAL LOW (ref 40.0–52.0)
HEMOGLOBIN: 6.2 g/dL — AB (ref 13.0–18.0)
MCH: 29.6 pg (ref 26.0–34.0)
MCHC: 32.3 g/dL (ref 32.0–36.0)
MCV: 91.8 fL (ref 80.0–100.0)
Platelets: 128 10*3/uL — ABNORMAL LOW (ref 150–440)
RBC: 2.09 MIL/uL — ABNORMAL LOW (ref 4.40–5.90)
RDW: 15.4 % — AB (ref 11.5–14.5)
WBC: 13.7 10*3/uL — ABNORMAL HIGH (ref 3.8–10.6)

## 2015-09-09 LAB — MAGNESIUM: Magnesium: 2.8 mg/dL — ABNORMAL HIGH (ref 1.7–2.4)

## 2015-09-09 LAB — BASIC METABOLIC PANEL
ANION GAP: 8 (ref 5–15)
BUN: 78 mg/dL — ABNORMAL HIGH (ref 6–20)
CALCIUM: 7.3 mg/dL — AB (ref 8.9–10.3)
CHLORIDE: 101 mmol/L (ref 101–111)
CO2: 29 mmol/L (ref 22–32)
CREATININE: 3.55 mg/dL — AB (ref 0.61–1.24)
GFR calc Af Amer: 21 mL/min — ABNORMAL LOW (ref >60)
GFR calc non Af Amer: 18 mL/min — ABNORMAL LOW (ref >60)
GLUCOSE: 189 mg/dL — AB (ref 65–99)
Potassium: 4.8 mmol/L (ref 3.5–5.1)
Sodium: 138 mmol/L (ref 135–145)

## 2015-09-09 LAB — GLUCOSE, CAPILLARY
GLUCOSE-CAPILLARY: 147 mg/dL — AB (ref 65–99)
Glucose-Capillary: 130 mg/dL — ABNORMAL HIGH (ref 65–99)
Glucose-Capillary: 140 mg/dL — ABNORMAL HIGH (ref 65–99)
Glucose-Capillary: 143 mg/dL — ABNORMAL HIGH (ref 65–99)

## 2015-09-09 LAB — PREPARE RBC (CROSSMATCH)

## 2015-09-09 MED ORDER — SODIUM CHLORIDE 0.9 % IV SOLN: Freq: Once | INTRAVENOUS | Status: DC

## 2015-09-09 MED ORDER — HYDROMORPHONE HCL 1 MG/ML IJ SOLN
1.0000 mg | Freq: Once | INTRAMUSCULAR | Status: AC
  Administered 2015-09-09: 2 mg via INTRAVENOUS
  Filled 2015-09-09: qty 2

## 2015-09-09 MED ORDER — SULFAMETHOXAZOLE-TRIMETHOPRIM 400-80 MG/5ML IV SOLN
940.0000 mg | Freq: Once | INTRAVENOUS | Status: AC
  Administered 2015-09-09: 940 mg via INTRAVENOUS
  Filled 2015-09-09: qty 58.8

## 2015-09-09 MED ORDER — FENTANYL CITRATE (PF) 100 MCG/2ML IJ SOLN: 100.0000 ug | Freq: Once | INTRAMUSCULAR | Status: DC

## 2015-09-09 MED ORDER — MIDAZOLAM HCL 2 MG/2ML IJ SOLN
2.0000 mg | Freq: Once | INTRAMUSCULAR | Status: AC
  Administered 2015-09-09: 2 mg via INTRAVENOUS

## 2015-09-09 NOTE — Progress Notes (Signed)
eLink Physician-Brief Progress Note Patient Name: John Hoover [place] DOB: 12-29-1965 MRN: [id number]   Date of Service  09/09/2015  HPI/Events of Note  Hgb drop from 7.0 to 6.2  eICU Interventions  Transfuse 1 unit pRBC Post-transfusion CBC     Intervention Category Intermediate Interventions: Other:  DETERDING,ELIZABETH 09/09/2015, 5:38 AM

## 2015-09-09 NOTE — Progress Notes (Signed)
Pharmacy Antibiotic Note Day #5 Zosyn Day #4 Bactrim  John Hoover is a 50 y.o. male admitted on 08/27/2015 with pneumonia. 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 Pharmacy consulted for zosyn and bactrim.  Plan: Zosyn 3.375gm E IV q12hrs due to HD  4/8-Based on adjusted BW of 94kg and dosing recommendation of /kg of trimethoprim, will give BACTRIM with  of trimethoprim IV x1 after dialysis. (Dose ordered for 4/7 was not charted as given, but RN did not find bag in CCU?).  4/9- to get HD again today. Bactrim ordered for once dose at 1800.  Will need to reassess dialysis schedule daily, as patient is new to dialysis and hasn't gotten a set schedule yet.   Height:  (185.4 cm) Weight: 244 lb 11.4 oz (111 kg) IBW/kg (Calculated) : 79.9  Temp (24hrs), Avg:98.3 F (36.8 C), Min:96.8 F (36 C), Max:99.5 F (37.5 C)   Recent Labs Lab 09/05/15 0437  09/06/15 0503 09/06/15 0938 09/06/15 1124 09/07/15 0515 09/08/15 0500 09/08/15 0848 09/09/15 0520  WBC 17.1*  --  18.3*  --   --  19.0*  --  19.1* 13.7*  CREATININE  --   < >  --  3.35* 3.31* 3.31* 3.42*  --  3.55*  < > = values in this interval not displayed.  Estimated Creatinine Clearance: 32.1 mL/min (by C-G formula based on Cr of 3.55).    No Known Allergies  Antimicrobials this admission: Anti-infectives    Start     Dose/Rate Route Frequency Ordered Stop   09/09/15 1800  sulfamethoxazole-trimethoprim (BACTRIM) 940 mg in dextrose 5 % 500 mL IVPB     940 mg 372.5 mL/hr over 90 Minutes Intravenous  Once 09/09/15 1213     09/08/15 1800  sulfamethoxazole-trimethoprim (BACTRIM) 940 mg in dextrose 5 % 500 mL IVPB     940 mg 372.5 mL/hr over 90 Minutes Intravenous  Once 09/08/15 1318 09/08/15 2013   09/07/15 2200  piperacillin-tazobactam (ZOSYN) IVPB 3.375 g     3.375 g 12.5 mL/hr over 240 Minutes Intravenous Every 12 hours 09/07/15  1322     09/07/15 1330  sulfamethoxazole-trimethoprim (BACTRIM) 940 mg of trimethoprim in dextrose 5 % 500 mL IVPB  Status:  Discontinued     940 mg of trimethoprim 372.5 mL/hr over 90 Minutes Intravenous  Once 09/07/15 1318 09/07/15 1324   09/07/15 1330  sulfamethoxazole-trimethoprim (BACTRIM) 940 mg of trimethoprim in dextrose 5 % 500 mL IVPB  Status:  Discontinued     940 mg of trimethoprim 372.5 mL/hr over 90 Minutes Intravenous Once in dialysis 09/07/15 1324 09/08/15 1320   09/06/15 1900  sulfamethoxazole-trimethoprim (BACTRIM) 940 mg of trimethoprim in dextrose 5 % 500 mL IVPB     940 mg of trimethoprim 372.5 mL/hr over 90 Minutes Intravenous  Once 09/06/15 1618 09/06/15 2010   09/05/15 1200  piperacillin-tazobactam (ZOSYN) IVPB 3.375 g  Status:  Discontinued     3.375 g 12.5 mL/hr over 240 Minutes Intravenous 3 times per day 09/05/15 1108 09/07/15 1322   09/04/15 1515  ampicillin (OMNIPEN) 2 g in sodium chloride 0.9 % 50 mL IVPB  Status:  Discontinued     2 g 150 mL/hr over 20 Minutes Intravenous 3 times per day 09/04/15 1511 09/05/15 1107   09/03/15 2200  ceFAZolin (ANCEF) IVPB 2g/100 mL premix  Status:  Discontinued     2 g 200 mL/hr over 30 Minutes Intravenous Every 12 hours 09/03/15 1251  09/04/15 1511   09/03/15 1300  ceFAZolin (ANCEF) IVPB 2g/100 mL premix  Status:  Discontinued     2 g 200 mL/hr over 30 Minutes Intravenous Every 12 hours 09/03/15 1250 09/03/15 1251   09/03/15 0030  vancomycin (VANCOCIN) IVPB 750 mg/150 ml premix  Status:  Discontinued     750 mg 150 mL/hr over 60 Minutes Intravenous Every 24 hours 09/02/15 1454 09/03/15 1124   09/02/15 1530  vancomycin (VANCOCIN) IVPB 750 mg/150 ml premix     750 mg 150 mL/hr over 60 Minutes Intravenous  Once 09/02/15 1454 09/02/15 1651   09/02/15 1445  vancomycin (VANCOCIN) 50 mg/mL oral solution 125 mg  Status:  Discontinued     125 mg Per Tube 4 times per day 09/02/15 1433 09/03/15 0052   08/28/15 2200  ceFEPIme  (MAXIPIME) 2 g in dextrose 5 % 50 mL IVPB  Status:  Discontinued     2 g 100 mL/hr over 30 Minutes Intravenous Every 12 hours 08/28/15 1726 09/03/15 1124   08/28/15 2200  oseltamivir (TAMIFLU) 6 MG/ML suspension 30 mg  Status:  Discontinued     30 mg Per Tube 2 times daily 08/28/15 1741 08/28/15 1855   08/28/15 2000  vancomycin (VANCOCIN) IVPB 1000 mg/200 mL premix  Status:  Discontinued     1,000 mg 200 mL/hr over 60 Minutes Intravenous Every 24 hours 08/28/15 1838 08/30/15 1637   08/28/15 1900  oseltamivir (TAMIFLU) 6 MG/ML suspension 30 mg  Status:  Discontinued     30 mg Per Tube Daily 08/28/15 1855 09/01/15 1056   08/28/15 1745  vancomycin (VANCOCIN) 1,250 mg in sodium chloride 0.9 % 250 mL IVPB  Status:  Discontinued     1,250 mg 166.7 mL/hr over 90 Minutes Intravenous STAT 08/28/15 1730 08/28/15 1837   08/27/15 2200  oseltamivir (TAMIFLU) capsule 75 mg  Status:  Discontinued     75 mg Oral 2 times daily 08/27/15 1806 08/27/15 1814   08/27/15 1900  cefTRIAXone (ROCEPHIN) 1 g in dextrose 5 % 50 mL IVPB  Status:  Discontinued     1 g 100 mL/hr over 30 Minutes Intravenous Every 24 hours 08/27/15 1806 08/28/15 1420      Microbiology results: Results for orders placed or performed during the hospital encounter of 08/27/15  Rapid Influenza A&B Antigens ([facility] only)     Status: None   Collection Time: 08/27/15 12:22 PM  Result Value Ref Range Status   Influenza A ([facility]) NEGATIVE NEGATIVE Final   Influenza B ([facility]) NEGATIVE NEGATIVE Final  Urine culture     Status: None   Collection Time: 08/27/15  7:40 PM  Result Value Ref Range Status   Specimen Description URINE, RANDOM  Final   Special Requests Normal  Final   Culture NO GROWTH 1 DAY  Final   Report Status 08/29/2015 FINAL  Final  MRSA PCR Screening     Status: None   Collection Time: 08/28/15  5:25 PM  Result Value Ref Range Status   MRSA by PCR NEGATIVE NEGATIVE Final    Comment:        The GeneXpert MRSA Assay  (FDA approved for NASAL specimens only), is one component of a comprehensive MRSA colonization surveillance program. It is not intended to diagnose MRSA infection nor to guide or monitor treatment for MRSA infections.   Culture, blood (Routine X 2) w Reflex to ID Panel     Status: None   Collection Time: 08/28/15  5:51 PM  Result Value  Ref Range Status   Specimen Description BLOOD RIGHT HAND  Final   Special Requests BAA,ANA,AER,5ML  Final   Culture NO GROWTH 5 DAYS  Final   Report Status 09/02/2015 FINAL  Final  Culture, blood (Routine X 2) w Reflex to ID Panel     Status: None   Collection Time: 08/28/15  5:51 PM  Result Value Ref Range Status   Specimen Description BLOOD RIGHT ASSIST CONTROL  Final   Special Requests BAA,ANA,AER,5ML  Final   Culture NO GROWTH 5 DAYS  Final   Report Status 09/02/2015 FINAL  Final  Culture, respiratory (NON-Expectorated)     Status: None   Collection Time: 08/29/15  2:16 PM  Result Value Ref Range Status   Specimen Description TRACHEAL ASPIRATE  Final   Special Requests NONE  Final   Gram Stain FEW WBC SEEN FEW YEAST   Final   Culture Consistent with normal respiratory flora.  Final   Report Status 09/01/2015 FINAL  Final  Culture, respiratory (NON-Expectorated)     Status: None   Collection Time: 08/31/15  3:45 PM  Result Value Ref Range Status   Specimen Description TRACHEAL ASPIRATE  Final   Special Requests NONE  Final   Gram Stain   Final    FEW WBC SEEN MANY GRAM POSITIVE RODS MODERATE GRAM POSITIVE COCCI RARE YEAST    Culture HEAVY GROWTH ENTEROCOCCUS FAECALIS  Final   Report Status 09/03/2015 FINAL  Final   Organism ID, Bacteria ENTEROCOCCUS FAECALIS  Final      Susceptibility   Enterococcus faecalis - MIC*    AMPICILLIN <=2 SENSITIVE Sensitive     VANCOMYCIN 1 SENSITIVE Sensitive     GENTAMICIN SYNERGY SENSITIVE Sensitive     LINEZOLID 2 SENSITIVE Sensitive     * HEAVY GROWTH ENTEROCOCCUS FAECALIS  Culture, blood  (Routine X 2) w Reflex to ID Panel     Status: None   Collection Time: 09/02/15  3:18 PM  Result Value Ref Range Status   Specimen Description BLOOD LINE  Final   Special Requests   Final    BOTTLES DRAWN AEROBIC AND ANAEROBIC  AER 4CC ANA 1CC   Culture NO GROWTH 5 DAYS  Final   Report Status 09/07/2015 FINAL  Final  Culture, blood (Routine X 2) w Reflex to ID Panel     Status: None   Collection Time: 09/02/15  3:20 PM  Result Value Ref Range Status   Specimen Description BLOOD RIGHT ARM  Final   Special Requests BOTTLES DRAWN AEROBIC AND ANAEROBIC  8CC  Final   Culture NO GROWTH 5 DAYS  Final   Report Status 09/07/2015 FINAL  Final  Culture, respiratory (NON-Expectorated)     Status: None   Collection Time: 09/02/15  4:30 PM  Result Value Ref Range Status   Specimen Description TRACHEAL ASPIRATE  Final   Special Requests NONE  Final   Gram Stain   Final    MODERATE WBC SEEN FEW GRAM POSITIVE COCCI IN PAIRS    Culture LIGHT GROWTH STENOTROPHOMONAS MALTOPHILIA  Final   Report Status 09/06/2015 FINAL  Final   Organism ID, Bacteria STENOTROPHOMONAS MALTOPHILIA  Final      Susceptibility   Stenotrophomonas maltophilia - MIC*    LEVOFLOXACIN <=0.12 SENSITIVE Sensitive     TRIMETH/SULFA <=20 SENSITIVE Sensitive     * LIGHT GROWTH STENOTROPHOMONAS MALTOPHILIA  C difficile quick scan w PCR reflex     Status: None   Collection Time: 09/02/15  4:56  PM  Result Value Ref Range Status   C Diff antigen NEGATIVE NEGATIVE Final   C Diff toxin NEGATIVE NEGATIVE Final   C Diff interpretation Negative for C. difficile  Final    Thank you for allowing pharmacy to be a part of this patient's care.  Bessie Boyte A 09/09/2015 12:23 PM

## 2015-09-09 NOTE — Progress Notes (Signed)
PULMONARY / CRITICAL CARE MEDICINE   Name: John Hoover MRN: [id number] DOB: 1965/08/26    ADMISSION DATE:  08/27/2015 CONSULTATION DATE:  08/28/15   PT PROFILE: John Hoover previously healthy 89 M admitted to gen med floor with several days of malaise and profound myalgias. Influenza PCR positive. On admission was noted to be hemoconcentrated with AKI and elevated CPK. On 03/28 developed progressive AMS and found to have profound metabolic acidosis. Transferred to ICU and PCCM consulted for management of acidosis, rhabdomyolysis, MODS, shock. Intubated, CVL placed and HD cath placed on arrival to ICU. Empiric abx initiated  SUBJECTIVE: Episode of agitation last night; O2 saturation stable during episode. Precedex restarted. No there issues. [facility] called with bed assignment but patient's wife declined transfer. Multiple failed SBT/PSV trials   MAJOR EVENTS/TEST RESULTS: 03/27 Admitted as above 03/28 transferred to ICU, intubated, CVL placed, HD cath placed, vasopressors initiated, Renal consult, CRRT initiated, HCO3 gtt initiated, empiric abx initated 03/28 CT head: NAD 03/28 Echocardiogram: LVEF 60-65%, grade one diastolic dysfunction 03/28 CTAP: no acute findings 03/30 BLE venous US: no DVT 03/31 Acidosis much improved. CRRT stopped, HCO3 gtt stopped 03/31 Transition off midaz infusion to dexmedetomidine 04/01 CRRT resumed. Back on low dose fentanyl infusion. Failed SBT. Trial furosemide with minimal response 04/02 Increasing agitation. Dexmedetomidine transitioned to propofol. R femoral HD cath not functioning and replaced after platelet transfusion. CXR pattern c/w edema, severe LE and scrotal edema - volume removal via CRRT 04/02 Worsening gas exchange requiring increased vent support and heavier sedation. HD cath required replacement. Platelets transfused prior to placement. Wife apprised several times over course of day 4/3 remains on CRRT very low UO 4/5 patient failed SAT/SBT due to  resp muscle fatigue 4/6 failed SAT/SBT again, Stenotrophomonas species  4/7 off pressors.  4/8 failed SAT/SBT again, Stenotrophomonas species   INDWELLING DEVICES:: ETT 03/28 >>  L Wallace CVL 03/28 >>  R femoral HD cath 03/28 >> 04/02 R femoral A-line 03/28 >>  R IJ HD cath 04/02 >>   MICRO DATA: Flu PCR 03/27 >> positive for A, neg for H1N1 MRSA PCR 03/28 >> NEG Urine 03/27 >> NEG Blood 03/28 >> NEG Resp 03/29 >> NOF HIV 03/31 >> NEG Resp 03/31 >>enterococcus faecalis C diff 04/02 >> NEG Resp 04/02 >> stenatrophomonas species   ANTIMICROBIALS:  Oseltamivir 03/27 >> 04/01 Vanc 03/28 >> 03/31 Cefepime 03/28 >>  Bactrim 4/6>>>>  VITAL SIGNS: BP 93/55 mmHg  Pulse 70  Temp(Src) 97.2 F (36.2 C) (Axillary)  Resp 21  Ht 6\' 1"  (1.854 m)  Wt 244 lb 11.4 oz (111 kg)  BMI 32.29 kg/m2  SpO2 99%  HEMODYNAMICS: CVP:  [16 mmHg-26 mmHg] 16 mmHg  VENTILATOR SETTINGS: Vent Mode:  [-] PSV FiO2 (%):  [35 %] 35 % Set Rate:  [20 bmp] 20 bmp Vt Set:  [600 mL] 600 mL PEEP:  [5 cmH20] 5 cmH20 Pressure Support:  [5 cmH20] 5 cmH20 Plateau Pressure:  [17 cmH20-19 cmH20] 17 cmH20  INTAKE / OUTPUT: I/O last 3 completed shifts: In: 2754.3 [I.V.:509.8; NW/GN:5621.3G/GT:1644.5; IV Piggyback:600] Out: 4170 [Urine:115; Other:4055]  PHYSICAL EXAMINATION: General: Sedated Neuro: PERRL, withdraws to pain HEENT: NCAT, WNL Cardiovascular: Regular, no murmurs, or regurg Lungs: Bilateral airflow, diffuse rhonchi, dependent crackles Abdomen: soft, NT, ND, diminished BS Ext: warm, +cyanosis,+2 edema GU: severe srotal and penile edema  LABS:  BMET  Recent Labs Lab 09/07/15 0515 09/08/15 0500 09/09/15 0520  NA 135 132* 138  K 5.4* 5.0 4.8  CL 104  98* 101  CO2 BUN 80* 73* 78*  CREATININE 3.31* 3.42* 3.55*  GLUCOSE 151* 150* 189*    Electrolytes  Recent Labs Lab 09/07/15 0515 09/08/15 0500 09/09/15 0520  CALCIUM 7.6* 7.1* 7.3*  MG 2.5* 2.7* 2.8*  PHOS 4.2 6.2* 8.1*     CBC  Recent Labs Lab 09/07/15 0515 09/08/15 0848 09/09/15 0520  WBC 19.0* 19.1* 13.7*  HGB 7.2* 7.0* 6.2*  HCT 22.3* 21.8* 19.2*  PLT 96* 128* 128*    Coag's No results for input(s): APTT, INR in the last 168 hours.  Sepsis Markers  Recent Labs Lab 09/03/15 0443  PROCALCITON 32.90    ABG  Recent Labs Lab 09/06/15 1100 09/07/15 1100 09/08/15 1839  PHART 7.37 7.44 7.48*  PCO2ART 45 39 47  PO2ART 71* 64* 74*    Liver Enzymes  Recent Labs Lab 09/03/15 0443 09/06/15 1124 09/07/15 0515  AST 287* 104* 103*  ALT 193* 58 57  ALKPHOS 84 98 87  BILITOT 0.9 0.5 0.5  ALBUMIN 1.6* 2.6* 2.4*    Cardiac Enzymes  Recent Labs Lab 09/02/15 1419 09/03/15 0443  TROPONINI 0.32* 0.23*    Glucose  Recent Labs Lab 09/08/15 0730 09/08/15 1107 09/08/15 1638 09/08/15 2349 09/09/15 0353 09/09/15 0709  GLUCAP 136* 128* 121* 119* 130* 140*    Dg Chest Port 1 View  09/09/2015  CLINICAL DATA:  Acute respiratory failure. EXAM: PORTABLE CHEST 1 VIEW COMPARISON:  09/08/2015 FINDINGS: Stable appearance of the endotracheal tube, 3.5 cm above the carina. Hazy airspace densities in both lungs, left side greater than right. The left hemidiaphragm is obscured by left basilar densities. Heart size is probably upper limits of normal. Left jugular dialysis catheter tip in the SVC. Nasogastric tube extends into the abdomen. Negative for a pneumothorax. Left subclavian central line tip is near the upper SVC. IMPRESSION: Persistent interstitial and airspace densities, left side greater than right. Minimal change from the previous examination. Persistent consolidation at the left lung base could reflect a combination of pleural fluid and airspace disease/ atelectasis. Support apparatuses as described. Electronically Signed   By: Richarda Overlie M.D.   On: 09/09/2015 07:57   Discussion: 50 yo white male with acute hypoxic resp failrue from acute INF A pneumonia with superimposed bacterial  pneumonia complicated by acute rhabdo with acute and severe acidosis with ARF  ASSESSMENT / PLAN:  PULMONARY A: Acute hypoxic resp failure-showing signs of failure to wean from vent Pulmonary edema Suspect VAP P:   -Cont vent support - settings reviewed and/or adjusted -Cont vent bundle -HD per nephrology for extra volume removal -Wean fio2 and PEEP as tolerated -Will likely need trach and PEG tube   CARDIOVASCULAR A:  Shock, recurrent P:  -MAP goal > 65 mmHg -Hemodynamics per ICU protocol  RENAL A:   AKI - likely ATN + NSAIDS Severe lactic acidosis, resolved Severe hypervolemia Rhabdomyolysis-CK trending down P:   -Monitor BMET intermittently -Monitor I/Os -Correct electrolytes as indicated -Renal service following - now on iHD -HD as scheduled  GASTROINTESTINAL A:   Heme + stools P:   -SUP: enteral PPI -Cont TFs  HEMATOLOGIC A:   Polycythemia, resolved Mild anemia, acute blood loss - hemodynamically stable Severe thrombocytopenia  P:  -DVT px: SCDs -Monitor CBC intermittently -Transfuse per usual guidelines -Platelet transfusion prn  INFECTIOUS A:   Severe sepsis 2/2 pneumonia P:   -Monitor temp, WBC count -Micro and abx as above  ENDOCRINE A:   Mild stress induced  hyperglycemia Risk of hypoglycemia in setting of AKI P:   -CBG q 8 hrs with no SSI -Consider resuming SSI for blood glucose consistently > 180  NEUROLOGIC A:   Acute ICU acquired encephalopathy Agitation-likely pain related ICU/vent associated discomfort P:   -RASS goal: -2, -3 -Cont dilaudid infusion -Cont midazolam infusion -Scheduled valium 5 mg QID -Scheduled oxycodone 2.5mg  TID -Will gradually titrate off sedatives and narcotics  as tolerated  Total ccm time =40 minutes  Magdalene S. [facility] ANP-BC Pulmonary and Critical Care Medicine [facility] Pager [phone]

## 2015-09-09 NOTE — Progress Notes (Signed)
[facility]  ROUNDING NOTE   Subjective:  Patient remains critically ill at the moment. However he is more awake and alert and tolerated pressure support all of yesterday. Sedation is being weaned. Urine output remains quite low overall however.    Objective:  Vital signs in last 24 hours:  Temp:  [96.8 F (36 C)-99.5 F (37.5 C)] 96.8 F (36 C) (04/09 0800) Pulse Rate:  [70-113] 95 (04/09 0800) Resp:  [7-22] 15 (04/09 0800) BP: (91-143)/(51-89) 115/74 mmHg (04/09 1030) SpO2:  [85 %-100 %] 98 % (04/09 0800) FiO2 (%):  [35 %] 35 % (04/09 0854) Weight:  [111 kg (244 lb 11.4 oz)-112.7 kg (248 lb 7.3 oz)] 111 kg (244 lb 11.4 oz) (04/09 0601)  Weight change: 0.07 kg (2.5 oz) Filed Weights   09/08/15 0500 09/08/15 1844 09/09/15 0601  Weight: 112.63 kg (248 lb 4.9 oz) 112.7 kg (248 lb 7.3 oz) 111 kg (244 lb 11.4 oz)    Intake/Output: I/O last 3 completed shifts: In: 2754.3 [I.V.:509.8; LT/RV:2023.3; IV [id number]:372] Out: 9021 [Urine:115; JDBZM:0802]   Intake/Output this shift:  Total I/O In: 277 [Blood:277] Out: -   Physical Exam: General: Critically ill   Head: +ETT +OGT  Eyes: Eyes closed  Neck:  trachea midline, left subclavian triple lumen  Lungs:  Bilateral rhonchi, vent assisted  Heart: S1S2 no rubs  Abdomen:  Soft, nontender  Extremities: ++edema, +Scrotal edema  Neurologic: Is moving around in bed, follows a few simple commands  Skin: Mottling over toes and fingers of rt hand. Rash over dorsum of feet- improving  Access: Left IJ dialysis cathter (4/2) Dr Alva Garnet    Basic Metabolic Panel:  Recent Labs Lab 09/05/15 0437  09/06/15 0503 09/06/15 2336 09/06/15 1124 09/07/15 0515 09/08/15 0500 09/09/15 0520  NA  --   < >  --  136 136 135 132* 138  K  --   < >  --  4.9 5.0 5.4* 5.0 4.8  CL  --   < >  --  108 107 104 98* 101  CO2  --   < >  --  25 25 27 26 29   GLUCOSE  --   < >  --  182* 175* 151* 150* 189*  BUN  --   < >  --  86* 89* 80*  73* 78*  CREATININE  --   < >  --  3.35* 3.31* 3.31* 3.42* 3.55*  CALCIUM  --   < >  --  8.0* 8.0* 7.6* 7.1* 7.3*  MG 2.5*  --  2.6*  --   --  2.5* 2.7* 2.8*  PHOS 2.5  --  3.4  --   --  4.2 6.2* 8.1*  < > = values in this interval not displayed.  Liver Function Tests:  Recent Labs Lab 09/03/15 0443 09/06/15 1124 09/07/15 0515  AST 287* 104* 103*  ALT 193* 58 57  ALKPHOS 84 98 87  BILITOT 0.9 0.5 0.5  PROT 4.0* 5.1* 5.0*  ALBUMIN 1.6* 2.6* 2.4*   No results for input(s): LIPASE, AMYLASE in the last 168 hours. No results for input(s): AMMONIA in the last 168 hours.  CBC:  Recent Labs Lab 09/05/15 0437 09/06/15 0503 09/07/15 0515 09/08/15 0848 09/09/15 0520  WBC 17.1* 18.3* 19.0* 19.1* 13.7*  NEUTROABS  --  16.2*  --   --   --   HGB 6.8* 7.7* 7.2* 7.0* 6.2*  HCT 21.5* 23.5* 22.3* 21.8* 19.2*  MCV 91.7 92.9 90.9  91.6 91.8  PLT 66* 69* 96* 128* 128*    Cardiac Enzymes:  Recent Labs Lab 09/02/15 1419 09/03/15 0443 09/05/15 0727  CKTOTAL  --  5397* 2906*  TROPONINI 0.32* 0.23*  --     BNP: Invalid input(s): POCBNP  CBG:  Recent Labs Lab 09/08/15 1107 09/08/15 1638 09/08/15 2349 09/09/15 0353 09/09/15 0709  GLUCAP 128* 121* 119* 130* 140*    Microbiology: Results for orders placed or performed during the hospital encounter of 08/27/15  Rapid Influenza A&B Antigens ([facility] only)     Status: None   Collection Time: 08/27/15 12:22 PM  Result Value Ref Range Status   Influenza A ([place]) NEGATIVE NEGATIVE Final   Influenza B ([facility]) NEGATIVE NEGATIVE Final  Urine culture     Status: None   Collection Time: 08/27/15  7:40 PM  Result Value Ref Range Status   Specimen Description URINE, RANDOM  Final   Special Requests Normal  Final   Culture NO GROWTH 1 DAY  Final   Report Status 08/29/2015 FINAL  Final  MRSA PCR Screening     Status: None   Collection Time: 08/28/15  5:25 PM  Result Value Ref Range Status   MRSA by PCR NEGATIVE NEGATIVE Final     Comment:        The GeneXpert MRSA Assay (FDA approved for NASAL specimens only), is one component of a comprehensive MRSA colonization surveillance program. It is not intended to diagnose MRSA infection nor to guide or monitor treatment for MRSA infections.   Culture, blood (Routine X 2) w Reflex to ID Panel     Status: None   Collection Time: 08/28/15  5:51 PM  Result Value Ref Range Status   Specimen Description BLOOD RIGHT HAND  Final   Special Requests BAA,ANA,AER,5ML  Final   Culture NO GROWTH 5 DAYS  Final   Report Status 09/02/2015 FINAL  Final  Culture, blood (Routine X 2) w Reflex to ID Panel     Status: None   Collection Time: 08/28/15  5:51 PM  Result Value Ref Range Status   Specimen Description BLOOD RIGHT ASSIST CONTROL  Final   Special Requests BAA,ANA,AER,5ML  Final   Culture NO GROWTH 5 DAYS  Final   Report Status 09/02/2015 FINAL  Final  Culture, respiratory (NON-Expectorated)     Status: None   Collection Time: 08/29/15  2:16 PM  Result Value Ref Range Status   Specimen Description TRACHEAL ASPIRATE  Final   Special Requests NONE  Final   Gram Stain FEW WBC SEEN FEW YEAST   Final   Culture Consistent with normal respiratory flora.  Final   Report Status 09/01/2015 FINAL  Final  Culture, respiratory (NON-Expectorated)     Status: None   Collection Time: 08/31/15  3:45 PM  Result Value Ref Range Status   Specimen Description TRACHEAL ASPIRATE  Final   Special Requests NONE  Final   Gram Stain   Final    FEW WBC SEEN MANY GRAM POSITIVE RODS MODERATE GRAM POSITIVE COCCI RARE YEAST    Culture HEAVY GROWTH ENTEROCOCCUS FAECALIS  Final   Report Status 09/03/2015 FINAL  Final   Organism ID, Bacteria ENTEROCOCCUS FAECALIS  Final      Susceptibility   Enterococcus faecalis - MIC*    AMPICILLIN <=2 SENSITIVE Sensitive     VANCOMYCIN 1 SENSITIVE Sensitive     GENTAMICIN SYNERGY SENSITIVE Sensitive     LINEZOLID 2 SENSITIVE Sensitive     * HEAVY GROWTH  ENTEROCOCCUS FAECALIS  Culture, blood (Routine X 2) w Reflex to ID Panel     Status: None   Collection Time: 09/02/15  3:18 PM  Result Value Ref Range Status   Specimen Description BLOOD LINE  Final   Special Requests   Final    BOTTLES DRAWN AEROBIC AND ANAEROBIC  AER 4CC ANA 1CC   Culture NO GROWTH 5 DAYS  Final   Report Status 09/07/2015 FINAL  Final  Culture, blood (Routine X 2) w Reflex to ID Panel     Status: None   Collection Time: 09/02/15  3:20 PM  Result Value Ref Range Status   Specimen Description BLOOD RIGHT ARM  Final   Special Requests BOTTLES DRAWN AEROBIC AND ANAEROBIC  8CC  Final   Culture NO GROWTH 5 DAYS  Final   Report Status 09/07/2015 FINAL  Final  Culture, respiratory (NON-Expectorated)     Status: None   Collection Time: 09/02/15  4:30 PM  Result Value Ref Range Status   Specimen Description TRACHEAL ASPIRATE  Final   Special Requests NONE  Final   Gram Stain   Final    MODERATE WBC SEEN FEW GRAM POSITIVE COCCI IN PAIRS    Culture LIGHT GROWTH STENOTROPHOMONAS [place]  Final   Report Status 09/06/2015 FINAL  Final   Organism ID, Bacteria STENOTROPHOMONAS MALTOPHILIA  Final      Susceptibility   Stenotrophomonas maltophilia - MIC*    LEVOFLOXACIN <=0.12 SENSITIVE Sensitive     TRIMETH/SULFA <=20 SENSITIVE Sensitive     * LIGHT GROWTH STENOTROPHOMONAS MALTOPHILIA  C difficile quick scan w PCR reflex     Status: None   Collection Time: 09/02/15  4:56 PM  Result Value Ref Range Status   C Diff antigen NEGATIVE NEGATIVE Final   C Diff toxin NEGATIVE NEGATIVE Final   C Diff interpretation Negative for C. difficile  Final    Coagulation Studies: No results for input(s): LABPROT, INR in the last 72 hours.  Urinalysis: No results for input(s): COLORURINE, LABSPEC, PHURINE, GLUCOSEU, HGBUR, BILIRUBINUR, KETONESUR, PROTEINUR, UROBILINOGEN, NITRITE, LEUKOCYTESUR in the last 72 hours.  Invalid input(s): APPERANCEUR    Imaging: US Venous Img Upper Uni  Right  09/07/2015  CLINICAL DATA:  Right upper extremity swelling and pain for 10 days EXAM: RIGHT UPPER EXTREMITY VENOUS DOPPLER ULTRASOUND TECHNIQUE: Gray-scale sonography with graded compression, as well as color Doppler and duplex ultrasound were performed to evaluate the upper extremity deep venous system from the level of the subclavian vein and including the jugular, axillary, basilic, radial, ulnar and upper cephalic vein. Spectral Doppler was utilized to evaluate flow at rest and with distal augmentation maneuvers. COMPARISON:  None. FINDINGS: Contralateral Subclavian Vein: Respiratory phasicity is normal and symmetric with the symptomatic side. No evidence of thrombus. Normal compressibility. Internal Jugular Vein: No evidence of thrombus. Normal compressibility, respiratory phasicity and response to augmentation. Subclavian Vein: No evidence of thrombus. Normal compressibility, respiratory phasicity and response to augmentation. Axillary Vein: No evidence of thrombus. Normal compressibility, respiratory phasicity and response to augmentation. Cephalic Vein: No evidence of thrombus. Normal compressibility, respiratory phasicity and response to augmentation. Basilic Vein: No evidence of thrombus. Normal compressibility, respiratory phasicity and response to augmentation. Brachial Veins: No evidence of thrombus. Normal compressibility, respiratory phasicity and response to augmentation. Radial Veins: No evidence of thrombus. Normal compressibility, respiratory phasicity and response to augmentation. Ulnar Veins: No evidence of thrombus. Normal compressibility, respiratory phasicity and response to augmentation. Venous Reflux:  None visualized. Other Findings:  None visualized. IMPRESSION: No evidence of deep venous thrombosis. Electronically Signed   By: Jerilynn Mages.  Shick M.D.   On: 09/07/2015 15:35   Dg Chest Port 1 View  09/09/2015  CLINICAL DATA:  Acute respiratory failure. EXAM: PORTABLE CHEST 1 VIEW  COMPARISON:  09/08/2015 FINDINGS: Stable appearance of the endotracheal tube, 3.5 cm above the carina. Hazy airspace densities in both lungs, left side greater than right. The left hemidiaphragm is obscured by left basilar densities. Heart size is probably upper limits of normal. Left jugular dialysis catheter tip in the SVC. Nasogastric tube extends into the abdomen. Negative for a pneumothorax. Left subclavian central line tip is near the upper SVC. IMPRESSION: Persistent interstitial and airspace densities, left side greater than right. Minimal change from the previous examination. Persistent consolidation at the left lung base could reflect a combination of pleural fluid and airspace disease/ atelectasis. Support apparatuses as described. Electronically Signed   By: Markus Daft M.D.   On: 09/09/2015 07:57   Dg Chest Port 1 View  09/08/2015  CLINICAL DATA:  51 year old male with a history of acute respiratory failure. EXAM: PORTABLE CHEST 1 VIEW COMPARISON:  09/07/2015, 09/05/2015 FINDINGS: Cardiomediastinal silhouette unchanged, though partially obscured by overlying lung and pleural disease. Endotracheal tube unchanged, terminating 3.3 cm above the carina. Left IJ central catheter unchanged, terminating in the superior vena cava. Unchanged left subclavian central line, terminating in the superior vena cava. Gastric tube unchanged, terminating out of the field of view. No pneumothorax. Bilateral mixed interstitial and airspace opacities, slightly improved on the left. IMPRESSION: Similar appearance the chest x-ray with mild improvement aeration on the left. Mixed interstitial and airspace opacities may reflect persisting edema, atelectasis, and/ or consolidation. Likely persisting left pleural effusion. Unchanged support apparatus as above. Signed, Dulcy Fanny. Earleen Newport, DO [facility] Electronically Signed   By: Corrie Mckusick D.O.   On: 09/08/2015 08:54      Medications:   . feeding supplement (VITAL 1.5 CAL) 1,000 mL (09/08/15 0048)  . HYDROmorphone 1.5 mg/hr (09/08/15 1900)  . midazolam (VERSED) infusion 7 mg/hr (09/09/15 1004)  . norepinephrine (LEVOPHED) Adult infusion Stopped (09/03/15 1614)   . sodium chloride   Intravenous Once  . sodium chloride   Intravenous Once  . sodium chloride   Intravenous Once  . antiseptic oral rinse  7 mL Mouth Rinse QID  . budesonide (PULMICORT) nebulizer solution  0.5 mg Nebulization BID  . chlorhexidine gluconate (SAGE KIT)  15 mL Mouth Rinse BID  . diazepam  5 mg Intravenous 4 times per day  . feeding supplement (PRO-STAT SUGAR FREE 64)  30 mL Oral 6 times per day  . free water  30 mL Per Tube 6 times per day  . ipratropium-albuterol  3 mL Nebulization Q4H  . methylPREDNISolone (SOLU-MEDROL) injection  40 mg Intravenous Q12H  . oxyCODONE  2.5 mg Oral 4 times per day  . pantoprazole sodium  40 mg Per Tube Q1200  . piperacillin-tazobactam (ZOSYN)  IV  3.375 g Intravenous Q12H   acetaminophen **OR** acetaminophen, albuterol, bisacodyl, heparin, hydrALAZINE, midazolam, sodium chloride, sodium chloride flush  Assessment/ Plan:  Mr. John Hoover is a 50 y.o. white male with no specific past medical history, who was admitted to [facility] on 08/27/2015 + Ibuprofen use prior to admission (600 mg TID x 2 days)  1. Acute Renal Failure, oliguric. Likely secondary to ATN 2. Metabolic Acidosis 3. Rhabdomyolysis with hyperphosphatemia 4. Acute respiratory failure, E faecalis (3/31) 5.  Influenza A positive 6. Hypoalbuminemia 7. Anasarca/Generalized edema 8. Anemia and Thrombocytopenia (hematology evaluation ongoing) 9. Hyperkalemia.  Plan Overall renal function remains quite low. Urine output over the preceding 24 hours was 75 cc. Therefore we will plan for hemodialysis again today. Ultrafiltration target 2-2.5 kg. However he does appear to be more awake and alert today. This is a good sign. Hopefully  he can be weaned from the ventilator in the relative near future. Further plan per pulmonary critical care. We will reassess for dialysis daily.   LOS: [place], Eara Burruel 4/9/201710:43 AM

## 2015-09-09 NOTE — Progress Notes (Signed)
MEDICATION RELATED CONSULT NOTE - FOLLOW UP   Pharmacy Consult for Constipation Prevention   No Known Allergies  Patient Measurements: Height: 6' 1"  (185.4 cm) Weight: 244 lb 11.4 oz (111 kg) IBW/kg (Calculated) : 79.9  Vital Signs: Temp: 98.8 F (37.1 C) (04/09 1200) Temp Source: Core (Comment) (04/09 1200) BP: 119/79 mmHg (04/09 1200) Pulse Rate: 115 (04/09 1200) Intake/Output from previous day: 04/08 0701 - 04/09 0700 In: 1166.4 [I.V.:286.4; NG/GT:330; IV Piggyback:550] Out: 2375 [Urine:75] Intake/Output from this shift: Total I/O In: 929.5 [I.V.:47.5; Blood:277; NG/GT:605] Out: -   Labs:  Recent Labs  09/07/15 0515 09/08/15 0500 09/08/15 0848 09/09/15 0520  WBC 19.0*  --  19.1* 13.7*  HGB 7.2*  --  7.0* 6.2*  HCT 22.3*  --  21.8* 19.2*  PLT 96*  --  128* 128*  CREATININE 3.31* 3.42*  --  3.55*  MG 2.5* 2.7*  --  2.8*  PHOS 4.2 6.2*  --  8.1*  ALBUMIN 2.4*  --   --   --   PROT 5.0*  --   --   --   AST 103*  --   --   --   ALT 57  --   --   --   ALKPHOS 87  --   --   --   BILITOT 0.5  --   --   --    Estimated Creatinine Clearance: 32.1 mL/min (by C-G formula based on Cr of 3.55).    Medical History: Past Medical History  Diagnosis Date  . Meniere's disease     Medications:  Scheduled:  . sodium chloride   Intravenous Once  . sodium chloride   Intravenous Once  . sodium chloride   Intravenous Once  . antiseptic oral rinse  7 mL Mouth Rinse QID  . budesonide (PULMICORT) nebulizer solution  0.5 mg Nebulization BID  . chlorhexidine gluconate (SAGE KIT)  15 mL Mouth Rinse BID  . diazepam  5 mg Intravenous 4 times per day  . feeding supplement (PRO-STAT SUGAR FREE 64)  30 mL Oral 6 times per day  . free water  30 mL Per Tube 6 times per day  . ipratropium-albuterol  3 mL Nebulization Q4H  . methylPREDNISolone (SOLU-MEDROL) injection  40 mg Intravenous Q12H  . oxyCODONE  2.5 mg Oral 4 times per day  . pantoprazole sodium  40 mg Per Tube Q1200  .  piperacillin-tazobactam (ZOSYN)  IV  3.375 g Intravenous Q12H  . sulfamethoxazole-trimethoprim  940 mg Intravenous Once   Infusions:  . feeding supplement (VITAL 1.5 CAL) 1,000 mL (09/09/15 1100)  . HYDROmorphone 1.5 mg/hr (09/09/15 1100)  . midazolam (VERSED) infusion 7 mg/hr (09/09/15 1100)  . norepinephrine (LEVOPHED) Adult infusion Stopped (09/03/15 1614)    Assessment: Pharmacy consulted to assist in adjusting medications for constipation prevention in this 50 y/o M with rhabdomyolysis.   Plan:   Patient with BM 4/5. Will continue senna-docusate 1 tab po PT bid.   4/8: Per Norm Parcel MD note on 4/6- patient with Flexiseal for diarrhea- MD discontinued senokot.  4/9 BM +  Pharmacy will continue to monitor and adjust per consult.   Tiarna Koppen A 09/09/2015,12:18 PM

## 2015-09-09 NOTE — Progress Notes (Signed)
Patient has been placed in PSV per Marylou FlesherMagadalene Tukov, NP.  RN has decreased sedation. Vitals stable at the moment. Patient tolerating well. Will continue to monitor

## 2015-09-09 NOTE — Progress Notes (Signed)
Bed control from [facility] called to offer Bed assignment.  Wife called and has declined to transfer patient at this time.

## 2015-09-09 NOTE — Progress Notes (Signed)
Update: Patient becoming more awake and purposeful. [facility] has a bed for patient, but wife has declined transfer at this time.  Plan - cont with current management - wean sedation/analgesia as tolerated - wean vent - tolerating PSV 5/5 well   Stephanie AcreVishal Jairo Bellew, MD [facility] Pager [phone]- [phone] (please enter 7-digits) On Call Pager - [phone] (please enter 7-digits)

## 2015-09-10 ENCOUNTER — Inpatient Hospital Stay: Payer: BLUE CROSS/BLUE SHIELD

## 2015-09-10 LAB — BASIC METABOLIC PANEL
Anion gap: 8 (ref 5–15)
BUN: 77 mg/dL — AB (ref 6–20)
CALCIUM: 7.5 mg/dL — AB (ref 8.9–10.3)
CO2: 29 mmol/L (ref 22–32)
CREATININE: 3.68 mg/dL — AB (ref 0.61–1.24)
Chloride: 101 mmol/L (ref 101–111)
GFR calc Af Amer: 20 mL/min — ABNORMAL LOW (ref >60)
GFR, EST NON AFRICAN AMERICAN: 18 mL/min — AB (ref >60)
Glucose, Bld: 128 mg/dL — ABNORMAL HIGH (ref 65–99)
POTASSIUM: 4.7 mmol/L (ref 3.5–5.1)
SODIUM: 138 mmol/L (ref 135–145)

## 2015-09-10 LAB — HEPATIC FUNCTION PANEL
ALK PHOS: 67 U/L (ref 38–126)
ALT: 42 U/L (ref 17–63)
AST: 45 U/L — ABNORMAL HIGH (ref 15–41)
Albumin: 2.7 g/dL — ABNORMAL LOW (ref 3.5–5.0)
BILIRUBIN TOTAL: 0.5 mg/dL (ref 0.3–1.2)
TOTAL PROTEIN: 5.1 g/dL — AB (ref 6.5–8.1)

## 2015-09-10 LAB — CK: CK TOTAL: 801 U/L — AB (ref 49–397)

## 2015-09-10 LAB — PHOSPHORUS: Phosphorus: 7.2 mg/dL — ABNORMAL HIGH (ref 2.5–4.6)

## 2015-09-10 LAB — CBC
HCT: 23.2 % — ABNORMAL LOW (ref 40.0–52.0)
Hemoglobin: 7.5 g/dL — ABNORMAL LOW (ref 13.0–18.0)
MCH: 29.4 pg (ref 26.0–34.0)
MCHC: 32.2 g/dL (ref 32.0–36.0)
MCV: 91.4 fL (ref 80.0–100.0)
PLATELETS: 148 10*3/uL — AB (ref 150–440)
RBC: 2.54 MIL/uL — AB (ref 4.40–5.90)
RDW: 16.3 % — ABNORMAL HIGH (ref 11.5–14.5)
WBC: 18.5 10*3/uL — ABNORMAL HIGH (ref 3.8–10.6)

## 2015-09-10 LAB — TRIGLYCERIDES: TRIGLYCERIDES: 94 mg/dL (ref <150)

## 2015-09-10 LAB — GLUCOSE, CAPILLARY
Glucose-Capillary: 102 mg/dL — ABNORMAL HIGH (ref 65–99)
Glucose-Capillary: 114 mg/dL — ABNORMAL HIGH (ref 65–99)

## 2015-09-10 LAB — MAGNESIUM: MAGNESIUM: 2.8 mg/dL — AB (ref 1.7–2.4)

## 2015-09-10 MED ORDER — HYDROMORPHONE HCL 1 MG/ML IJ SOLN: 1.0000 mg | INTRAMUSCULAR | Status: DC | PRN

## 2015-09-10 MED ORDER — LORAZEPAM 2 MG/ML IJ SOLN: 0.5000 mg | INTRAMUSCULAR | Status: DC | PRN

## 2015-09-10 MED ORDER — HEPARIN SODIUM (PORCINE) 5000 UNIT/ML IJ SOLN
5000.0000 [IU] | Freq: Three times a day (TID) | INTRAMUSCULAR | Status: DC
  Administered 2015-09-10 – 2015-09-13 (×11): 5000 [IU] via SUBCUTANEOUS
  Filled 2015-09-10 (×11): qty 1

## 2015-09-10 MED ORDER — HYDROMORPHONE HCL 1 MG/ML IJ SOLN
INTRAMUSCULAR | Status: AC
  Administered 2015-09-10: 1 mg
  Filled 2015-09-10: qty 2

## 2015-09-10 MED ORDER — CETYLPYRIDINIUM CHLORIDE 0.05 % MT LIQD
7.0000 mL | Freq: Two times a day (BID) | OROMUCOSAL | Status: DC
  Administered 2015-09-10 – 2015-09-13 (×6): 7 mL via OROMUCOSAL

## 2015-09-10 MED ORDER — HYDROMORPHONE HCL 1 MG/ML IJ SOLN
2.0000 mg | Freq: Once | INTRAMUSCULAR | Status: AC
  Administered 2015-09-10: 2 mg via INTRAVENOUS

## 2015-09-10 MED ORDER — HYDROMORPHONE HCL 1 MG/ML IJ SOLN
1.0000 mg | INTRAMUSCULAR | Status: DC | PRN
  Administered 2015-09-10: 1 mg via INTRAVENOUS
  Filled 2015-09-10: qty 1

## 2015-09-10 MED ORDER — HYDROMORPHONE HCL 1 MG/ML IJ SOLN: 2.0000 mg | Freq: Once | INTRAMUSCULAR | Status: DC

## 2015-09-10 MED ORDER — HYDROMORPHONE HCL 1 MG/ML IJ SOLN
INTRAMUSCULAR | Status: AC
  Filled 2015-09-10: qty 1

## 2015-09-10 MED ORDER — SULFAMETHOXAZOLE-TRIMETHOPRIM 400-80 MG/5ML IV SOLN
940.0000 mg | Freq: Once | INTRAVENOUS | Status: AC
  Administered 2015-09-10: 940 mg via INTRAVENOUS
  Filled 2015-09-10: qty 58.8

## 2015-09-10 MED ORDER — HYDRALAZINE HCL 20 MG/ML IJ SOLN: 10.0000 mg | INTRAMUSCULAR | Status: DC | PRN

## 2015-09-10 MED ORDER — LORAZEPAM 2 MG/ML IJ SOLN
0.5000 mg | INTRAMUSCULAR | Status: DC | PRN
  Administered 2015-09-11: 1 mg via INTRAVENOUS
  Filled 2015-09-10: qty 1

## 2015-09-10 MED ORDER — FENTANYL 75 MCG/HR TD PT72
75.0000 ug | MEDICATED_PATCH | TRANSDERMAL | Status: DC
  Administered 2015-09-10: 75 ug via TRANSDERMAL
  Filled 2015-09-10: qty 1

## 2015-09-10 MED ORDER — FUROSEMIDE 10 MG/ML IJ SOLN
120.0000 mg | Freq: Once | INTRAVENOUS | Status: AC
  Administered 2015-09-10: 120 mg via INTRAVENOUS
  Filled 2015-09-10: qty 12

## 2015-09-10 NOTE — Progress Notes (Signed)
Pharmacy Antibiotic Note Day 5 Bactrim  John Hoover is a 50 y.o. male admitted on 08/27/2015 with pneumonia. 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 Pharmacy consulted for bactrim.  Plan: Will continue patient on Bactrim  to be infused after dialysis on dialysis days. Patient currently receiving dialysis daily. Will follow daily and order Bactrim as appropriate.   Height:  (185.4 cm) Weight: 257 lb 15 oz (117 kg) IBW/kg (Calculated) : 79.9  Temp (24hrs), Avg:99.3 F (37.4 C), Min:98.3 F (36.8 C), Max:100 F (37.8 C)   Recent Labs Lab 09/06/15 0503  09/06/15 1124 09/07/15 0515 09/08/15 0500 09/08/15 0848 09/09/15 0520 09/10/15 0604  WBC 18.3*  --   --  19.0*  --  19.1* 13.7* 18.5*  CREATININE  --   < > 3.31* 3.31* 3.42*  --  3.55* 3.68*  < > = values in this interval not displayed.  Estimated Creatinine Clearance: 31.8 mL/min (by C-G formula based on Cr of 3.68).    No Known Allergies  Antimicrobials this admission: Anti-infectives    Start     Dose/Rate Route Frequency Ordered Stop   09/10/15 1800  sulfamethoxazole-trimethoprim (BACTRIM) 940 mg in dextrose 5 % 500 mL IVPB     940 mg 372.5 mL/hr over 90 Minutes Intravenous  Once 09/10/15 1210     09/09/15 1800  sulfamethoxazole-trimethoprim (BACTRIM) 940 mg in dextrose 5 % 500 mL IVPB     940 mg 372.5 mL/hr over 90 Minutes Intravenous  Once 09/09/15 1213 09/09/15 1939   09/08/15 1800  sulfamethoxazole-trimethoprim (BACTRIM) 940 mg in dextrose 5 % 500 mL IVPB     940 mg 372.5 mL/hr over 90 Minutes Intravenous  Once 09/08/15 1318 09/08/15 2013   09/07/15 2200  piperacillin-tazobactam (ZOSYN) IVPB 3.375 g  Status:  Discontinued     3.375 g 12.5 mL/hr over 240 Minutes Intravenous Every 12 hours 09/07/15 1322 09/10/15 0833   09/07/15 1330  sulfamethoxazole-trimethoprim (BACTRIM) 940 mg of trimethoprim in dextrose 5 % 500 mL IVPB   Status:  Discontinued     940 mg of trimethoprim 372.5 mL/hr over 90 Minutes Intravenous  Once 09/07/15 1318 09/07/15 1324   09/07/15 1330  sulfamethoxazole-trimethoprim (BACTRIM) 940 mg of trimethoprim in dextrose 5 % 500 mL IVPB  Status:  Discontinued     940 mg of trimethoprim 372.5 mL/hr over 90 Minutes Intravenous Once in dialysis 09/07/15 1324 09/08/15 1320   09/06/15 1900  sulfamethoxazole-trimethoprim (BACTRIM) 940 mg of trimethoprim in dextrose 5 % 500 mL IVPB     940 mg of trimethoprim 372.5 mL/hr over 90 Minutes Intravenous  Once 09/06/15 1618 09/06/15 2010   09/05/15 1200  piperacillin-tazobactam (ZOSYN) IVPB 3.375 g  Status:  Discontinued     3.375 g 12.5 mL/hr over 240 Minutes Intravenous 3 times per day 09/05/15 1108 09/07/15 1322   09/04/15 1515  ampicillin (OMNIPEN) 2 g in sodium chloride 0.9 % 50 mL IVPB  Status:  Discontinued     2 g 150 mL/hr over 20 Minutes Intravenous 3 times per day 09/04/15 1511 09/05/15 1107   09/03/15 2200  ceFAZolin (ANCEF) IVPB 2g/100 mL premix  Status:  Discontinued     2 g 200 mL/hr over 30 Minutes Intravenous Every 12 hours 09/03/15 1251 09/04/15 1511   09/03/15 1300  ceFAZolin (ANCEF) IVPB 2g/100 mL premix  Status:  Discontinued     2 g 200 mL/hr over 30 Minutes Intravenous Every 12 hours 09/03/15  1250 09/03/15 1251   09/03/15 0030  vancomycin (VANCOCIN) IVPB 750 mg/150 ml premix  Status:  Discontinued     750 mg 150 mL/hr over 60 Minutes Intravenous Every 24 hours 09/02/15 1454 09/03/15 1124   09/02/15 1530  vancomycin (VANCOCIN) IVPB 750 mg/150 ml premix     750 mg 150 mL/hr over 60 Minutes Intravenous  Once 09/02/15 1454 09/02/15 1651   09/02/15 1445  vancomycin (VANCOCIN) 50 mg/mL oral solution 125 mg  Status:  Discontinued     125 mg Per Tube 4 times per day 09/02/15 1433 09/03/15 0052   08/28/15 2200  ceFEPIme (MAXIPIME) 2 g in dextrose 5 % 50 mL IVPB  Status:  Discontinued     2 g 100 mL/hr over 30 Minutes Intravenous Every 12  hours 08/28/15 1726 09/03/15 1124   08/28/15 2200  oseltamivir (TAMIFLU) 6 MG/ML suspension 30 mg  Status:  Discontinued     30 mg Per Tube 2 times daily 08/28/15 1741 08/28/15 1855   08/28/15 2000  vancomycin (VANCOCIN) IVPB 1000 mg/200 mL premix  Status:  Discontinued     1,000 mg 200 mL/hr over 60 Minutes Intravenous Every 24 hours 08/28/15 1838 08/30/15 1637   08/28/15 1900  oseltamivir (TAMIFLU) 6 MG/ML suspension 30 mg  Status:  Discontinued     30 mg Per Tube Daily 08/28/15 1855 09/01/15 1056   08/28/15 1745  vancomycin (VANCOCIN) 1,250 mg in sodium chloride 0.9 % 250 mL IVPB  Status:  Discontinued     1,250 mg 166.7 mL/hr over 90 Minutes Intravenous STAT 08/28/15 1730 08/28/15 1837   08/27/15 2200  oseltamivir (TAMIFLU) capsule 75 mg  Status:  Discontinued     75 mg Oral 2 times daily 08/27/15 1806 08/27/15 1814   08/27/15 1900  cefTRIAXone (ROCEPHIN) 1 g in dextrose 5 % 50 mL IVPB  Status:  Discontinued     1 g 100 mL/hr over 30 Minutes Intravenous Every 24 hours 08/27/15 1806 08/28/15 1420      Microbiology results: Results for orders placed or performed during the hospital encounter of 08/27/15  Rapid Influenza A&B Antigens ([facility] only)     Status: None   Collection Time: 08/27/15 12:22 PM  Result Value Ref Range Status   Influenza A ([facility]) NEGATIVE NEGATIVE Final   Influenza B ([facility]) NEGATIVE NEGATIVE Final  Urine culture     Status: None   Collection Time: 08/27/15  7:40 PM  Result Value Ref Range Status   Specimen Description URINE, RANDOM  Final   Special Requests Normal  Final   Culture NO GROWTH 1 DAY  Final   Report Status 08/29/2015 FINAL  Final  MRSA PCR Screening     Status: None   Collection Time: 08/28/15  5:25 PM  Result Value Ref Range Status   MRSA by PCR NEGATIVE NEGATIVE Final    Comment:        The GeneXpert MRSA Assay (FDA approved for NASAL specimens only), is one component of a comprehensive MRSA colonization surveillance program. It is  not intended to diagnose MRSA infection nor to guide or monitor treatment for MRSA infections.   Culture, blood (Routine X 2) w Reflex to ID Panel     Status: None   Collection Time: 08/28/15  5:51 PM  Result Value Ref Range Status   Specimen Description BLOOD RIGHT HAND  Final   Special Requests [facility]  Final   Culture NO GROWTH 5 DAYS  Final   Report Status 09/02/2015  FINAL  Final  Culture, blood (Routine X 2) w Reflex to ID Panel     Status: None   Collection Time: 08/28/15  5:51 PM  Result Value Ref Range Status   Specimen Description BLOOD RIGHT ASSIST CONTROL  Final   Special Requests BAA,ANA,AER,5ML  Final   Culture NO GROWTH 5 DAYS  Final   Report Status 09/02/2015 FINAL  Final  Culture, respiratory (NON-Expectorated)     Status: None   Collection Time: 08/29/15  2:16 PM  Result Value Ref Range Status   Specimen Description TRACHEAL ASPIRATE  Final   Special Requests NONE  Final   Gram Stain FEW WBC SEEN FEW YEAST   Final   Culture Consistent with normal respiratory flora.  Final   Report Status 09/01/2015 FINAL  Final  Culture, respiratory (NON-Expectorated)     Status: None   Collection Time: 08/31/15  3:45 PM  Result Value Ref Range Status   Specimen Description TRACHEAL ASPIRATE  Final   Special Requests NONE  Final   Gram Stain   Final    FEW WBC SEEN MANY GRAM POSITIVE RODS MODERATE GRAM POSITIVE COCCI RARE YEAST    Culture HEAVY GROWTH ENTEROCOCCUS FAECALIS  Final   Report Status 09/03/2015 FINAL  Final   Organism ID, Bacteria ENTEROCOCCUS FAECALIS  Final      Susceptibility   Enterococcus faecalis - MIC*    AMPICILLIN <=2 SENSITIVE Sensitive     VANCOMYCIN 1 SENSITIVE Sensitive     GENTAMICIN SYNERGY SENSITIVE Sensitive     LINEZOLID 2 SENSITIVE Sensitive     * HEAVY GROWTH ENTEROCOCCUS FAECALIS  Culture, blood (Routine X 2) w Reflex to ID Panel     Status: None   Collection Time: 09/02/15  3:18 PM  Result Value Ref Range Status    Specimen Description BLOOD LINE  Final   Special Requests   Final    BOTTLES DRAWN AEROBIC AND ANAEROBIC  AER 4CC ANA 1CC   Culture NO GROWTH 5 DAYS  Final   Report Status 09/07/2015 FINAL  Final  Culture, blood (Routine X 2) w Reflex to ID Panel     Status: None   Collection Time: 09/02/15  3:20 PM  Result Value Ref Range Status   Specimen Description BLOOD RIGHT ARM  Final   Special Requests BOTTLES DRAWN AEROBIC AND ANAEROBIC  8CC  Final   Culture NO GROWTH 5 DAYS  Final   Report Status 09/07/2015 FINAL  Final  Culture, respiratory (NON-Expectorated)     Status: None   Collection Time: 09/02/15  4:30 PM  Result Value Ref Range Status   Specimen Description TRACHEAL ASPIRATE  Final   Special Requests NONE  Final   Gram Stain   Final    MODERATE WBC SEEN FEW GRAM POSITIVE COCCI IN PAIRS    Culture LIGHT GROWTH STENOTROPHOMONAS MALTOPHILIA  Final   Report Status 09/06/2015 FINAL  Final   Organism ID, Bacteria STENOTROPHOMONAS MALTOPHILIA  Final      Susceptibility   Stenotrophomonas maltophilia - MIC*    LEVOFLOXACIN <=0.12 SENSITIVE Sensitive     TRIMETH/SULFA <=20 SENSITIVE Sensitive     * LIGHT GROWTH STENOTROPHOMONAS MALTOPHILIA  C difficile quick scan w PCR reflex     Status: None   Collection Time: 09/02/15  4:56 PM  Result Value Ref Range Status   C Diff antigen NEGATIVE NEGATIVE Final   C Diff toxin NEGATIVE NEGATIVE Final   C Diff interpretation Negative for C. difficile  Final    Thank you for allowing pharmacy to be a part of this patient's care.  Iziah Cates L 09/10/2015 3:25 PM

## 2015-09-10 NOTE — Progress Notes (Signed)
Patient has been placed in pressure support mode per NP, M. Tukov.  Remains very agitated at the moment. RN to titrate sedation accordingly

## 2015-09-10 NOTE — Progress Notes (Signed)
Pre dialysis assessment 

## 2015-09-10 NOTE — Progress Notes (Signed)
Patient extubated by Amy, RRT.  Patient has strong cough and very soft hoarse voice.  Follows commands. Versed drip stopped. Wife and father at bedside. Continuing to monitor. No respiratory distress, 2L nasal cannula. Equal rise and fall of chest.

## 2015-09-10 NOTE — Progress Notes (Signed)
RN spoke with infectious prevention specialist Cherri Speagle and discussed need for droplet precaution.  Per Cherri Speagle droplet precautions can be discontinued. RN made Dr. Sung AmabileSimonds aware also.

## 2015-09-10 NOTE — Consult Note (Signed)
WOC wound consult note Reason for Consult: Consult requested for hands and feet.  Pt has multiple areas of purple mottling and dark red-fluid filled blisters to bilat fingers, no open wounds or drainage at this time. Bedside nurse reports he was previously on Levophed. Bilat feet with toes of the same appearance; blister has ruptured between right 4th and 5th toes and evolved into a full thickness wound; 2X1X.1cm, tip of small toe with dry eschar.   Dressing procedure/placement/frequency:  Pt is high risk for fingers and toes to evolve into open wounds or eschar.  Topical treatment will not be effective to promote healing; [facility] scope of practice.  Recommend consult to vascular team for further plan of care.  Xeroform gauze to affected areas if blisters rupture to decrease discomfort and protect the affected areas from further injury. Please re-consult if further assistance is needed.  Thank-you,  Cammie Mcgeeawn Florencia Zaccaro MSN, RN, CWOCN, [place]AP, CNS [phone]

## 2015-09-10 NOTE — Progress Notes (Signed)
Chaplain rounded the unit and provided a compassionate presence and support to the patient and family.  Chaplain Dayven Linsley [phone] 

## 2015-09-10 NOTE — Care Management (Signed)
[facility] and [facility] declined.  Will initiate ltac assessment and facility preference is [facility].  [facility].  WOC assessed toes and fingers- area of blisters/escahr.   Patient extubated  this morning.  Requiring daily dialysis treatments.  Today, [facility] does not have an open dialysis bed.  ?[facility] [place].

## 2015-09-10 NOTE — Progress Notes (Signed)
Nutrition Follow-up  DOCUMENTATION CODES:   Not applicable  INTERVENTION:   Meals/Snacks: await diet progression Medical Food Supplement Therapy: recommend addition of nutritional supplement once diet advanced  NUTRITION DIAGNOSIS:   Inadequate oral intake related to acute illness as evidenced by NPO status.  GOAL:   Provide needs based on ASPEN/SCCM guidelines  MONITOR:   TF tolerance, Vent status, Labs, I & O's, Weight trends  REASON FOR ASSESSMENT:   Ventilator    ASSESSMENT:   Pt s/p extubation this AM, currently on 2L [place], noted SLP evaluation ordered for tomorrow. Received dialysis yesterday with 2.4 L removed. Plan for HD again today  Diet Order:  Diet NPO time specified  Skin:  Reviewed, no issues  Last BM:  09/07/15   Labs: Creatinine 3.68, BUN 77  Meds: reviewed  Height:   Ht Readings from Last 1 Encounters:  08/28/15 6\' 1"  (1.854 m)    Weight:   Wt Readings from Last 1 Encounters:  09/09/15 257 lb 15 oz (117 kg)    BMI:  Body mass index is 34.04 kg/(m^2).  Estimated Nutritional Needs:   Kcal:  2280-2700 kcals   Protein:  102-128 g (1.2-1.5 g/kg)   Fluid:  1 L plus UOP  EDUCATION NEEDS:   Education needs no appropriate at this time  Romelle StarcherCate Breniya Goertzen MS, RD, LDN [phone] Pager  [phone] Weekend/On-Call Pager

## 2015-09-10 NOTE — Progress Notes (Signed)
Speech Therapy Note: received order, reviewed chart notes indicating pt was extubated this morning post ~13 days of oral intubation. Pt's vocal quality is currently c/b a whisper quality. Pt is at an increased risk for aspiration post lengthy intubation. Rec. Pt continue w/ swallowing of saliva and be given oral care for hygiene and stim. to increased swallowing. ST will f/u tomorrow for BSE and initiation of least restrictive po diet. Dietician has been following.

## 2015-09-10 NOTE — Progress Notes (Signed)
Extubation orders per Dr. Sung AmabileSimonds. He was suctioned prior to extubation for a moderate amount of pink tinged secretions. He was extubated without incident to a 2 L nasal cannula. He has a Scientist, physiologicalwhisper voice. No wheezes or stridor heard.

## 2015-09-10 NOTE — Progress Notes (Signed)
Patient has been awake since extubation until around 1800 and now resting.  Alert to self only.  Follows commands but confused to place and situation.  Denies pain. Stach per cardiac monitor rate in low 100's.  o2 sats 98-100% on 2L nasal cannula. VSS.  Dialysis treatment in progress and will finish shortly. 250 mL urine output this shift. Family has visited throughout shift.

## 2015-09-10 NOTE — Progress Notes (Signed)
PULMONARY / CRITICAL CARE MEDICINE   Name: John Hoover MRN: [id number] DOB: [birth year]    ADMISSION DATE:  08/27/2015 CONSULTATION DATE:  08/28/15   PT PROFILE: John Hoover previously healthy 41 M admitted to gen med floor with several days of malaise and profound myalgias. Influenza PCR positive. On admission was noted to be hemoconcentrated with AKI and elevated CPK. On 03/28 developed progressive AMS and found to have profound metabolic acidosis. Transferred to ICU and PCCM consulted for management of acidosis, rhabdomyolysis, MODS, shock. Intubated, CVL placed and HD cath placed on arrival to ICU. Empiric abx initiated  SUBJECTIVE: Agitation and complaints of pain overnight. Given two extra doses of hydromorphone with moderate relief. Tolerated >16h of PSV during the day; placed back on PRVC overnight. Elink contacted and recommended starting diprivan gtt; held due to moderate improvement in symptoms.  HD yesterday with removal of 2L of fluids. Transfused 1 unit of PRBCs for H/H 6.2/19.2. Nods in approval to pain.   MAJOR EVENTS/TEST RESULTS: 03/27 Admitted as above 03/28 transferred to ICU, intubated, CVL placed, HD cath placed, vasopressors initiated, Renal consult, CRRT initiated, HCO3 gtt initiated, empiric abx initated 03/28 CT head: NAD 03/28 Echocardiogram: LVEF 60-65%, grade one diastolic dysfunction 03/28 CTAP: no acute findings 03/30 BLE venous US: no DVT 03/31 Acidosis much improved. CRRT stopped, HCO3 gtt stopped 03/31 Transition off midaz infusion to dexmedetomidine 04/01 CRRT resumed. Back on low dose fentanyl infusion. Failed SBT. Trial furosemide with minimal response 04/02 Increasing agitation. Dexmedetomidine transitioned to propofol. R femoral HD cath not functioning and replaced after platelet transfusion. CXR pattern c/w edema, severe LE and scrotal edema - volume removal via CRRT 04/02 Worsening gas exchange requiring increased vent support and heavier sedation. HD  cath required replacement. Platelets transfused prior to placement. Wife apprised several times over course of day 040/3 remains on CRRT very low UO 04/05 patient failed SAT/SBT due to resp muscle fatigue 04/05 One unit PRBCs transfused for Hgb 6.8 04/06 failed SAT/SBT again, Stenotrophomonas species  04/07 off pressors.  04/08 failed SAT/SBT again, Stenotrophomonas species 04/09 tolerated PSV all day 04/09 One unit PRBCs transfused for Hgb 6.2 04/10 Extubated. Transitioned off hydromorphone gtt to Duragesic patch. Transitioned off midaz gtt to PRN  lorazepam  INDWELLING DEVICES:: ETT 03/28 >> 04/10 L [place] CVL 03/28 >>  R femoral HD cath 03/28 >> 04/02 R femoral A-line 03/28 >> out R IJ HD cath 04/02 >>   MICRO DATA: Flu PCR 03/27 >> positive for A, neg for H1N1 MRSA PCR 03/28 >> NEG Urine 03/27 >> NEG Blood 03/28 >> NEG Resp 03/29 >> NOF HIV 03/31 >> NEG Resp 03/31 >>enterococcus faecalis C diff 04/02 >> NEG Resp 04/02 >> stenotrophomonas    ANTIMICROBIALS:  Oseltamivir 03/27 >> 04/01 Ancef 04/04>04/05 Ampicillin 04/04>>04/05  Vanc 03/28 >> 03/31 Cefepime 03/28 >>04/03  Zosyn 04/07 >> 04/10 Bactrim 04/06 >>   SUBJ: Passed SBT, + F/C. Extubated and tolerating  VITAL SIGNS: BP 118/71 mmHg  Pulse 106  Temp(Src) 98.8 F (37.1 C) (Core (Comment))  Resp 19  Ht  (1.854 m)  Wt 257 lb 15 oz (117 kg)  BMI 34.04 kg/m2  SpO2 100%  HEMODYNAMICS: CVP:  [12 mmHg-24 mmHg] 14 mmHg  VENTILATOR SETTINGS: Vent Mode:  [-] PRVC FiO2 (%):  [35 %] 35 % Set Rate:  [20 bmp] 20 bmp Vt Set:  [600 mL] 600 mL PEEP:  [5 cmH20] 5 cmH20 Pressure Support:  [5 cmH20] 5 cmH20  INTAKE / OUTPUT:  I/O last 3 completed shifts: In: 2595.3 [I.V.:393.3; Blood:277; NG/GT:1375; IV Piggyback:550] Out: 5975 [Urine:375; Other:4700; Stool:900]  PHYSICAL EXAMINATION: General: RASS -1, + F/C Neuro: PERRL, follows commands, moves all extremities HEENT: WNL Cardiovascular: Reg, no M Lungs:  clear anteriorly Abdomen: soft, NT, ND, + BS Ext: warm, symmetric BLE edema, several digits with small areas of necrosis on tips, multiple bullae on BUE and BLE GU: severe scrotal and penile edema  LABS:  BMET  Recent Labs Lab 09/07/15 0515 09/08/15 0500 09/09/15 0520  NA 135 132* 138  K 5.4* 5.0 4.8  CL 104 98* 101  CO2 27 26 29   BUN 80* 73* 78*  CREATININE 3.31* 3.42* 3.55*  GLUCOSE 151* 150* 189*    Electrolytes  Recent Labs Lab 09/07/15 0515 09/08/15 0500 09/09/15 0520  CALCIUM 7.6* 7.1* 7.3*  MG 2.5* 2.7* 2.8*  PHOS 4.2 6.2* 8.1*    CBC  Recent Labs Lab 09/07/15 0515 09/08/15 0848 09/09/15 0520  WBC 19.0* 19.1* 13.7*  HGB 7.2* 7.0* 6.2*  HCT 22.3* 21.8* 19.2*  PLT 96* 128* 128*    Coag's No results for input(s): APTT, INR in the last 168 hours.  Sepsis Markers No results for input(s): LATICACIDVEN, PROCALCITON, O2SATVEN in the last 168 hours.  ABG  Recent Labs Lab 09/06/15 1100 09/07/15 1100 09/08/15 1839  PHART 7.37 7.44 7.48*  PCO2ART 45 39 47  PO2ART 71* 64* 74*    Liver Enzymes  Recent Labs Lab 09/06/15 1124 09/07/15 0515  AST 104* 103*  ALT 58 57  ALKPHOS 98 87  BILITOT 0.5 0.5  ALBUMIN 2.6* 2.4*    Cardiac Enzymes No results for input(s): TROPONINI, PROBNP in the last 168 hours.  Glucose  Recent Labs Lab 09/08/15 2349 09/09/15 0353 09/09/15 0709 09/09/15 1551 09/09/15 1619 09/10/15 0019  GLUCAP 119* 130* 140* 143* 147* 102*    CXR: L>R infiltrates   ASSESSMENT / PLAN:  PULMONARY A: Acute hypoxic resp failure Pulmonary edema pattern Stenotrophomonas VAP P:   Monitor in ICU post extubation Supplemental O2 as needed to maintain SpO2 > 92% DC scheduled BDs   CARDIOVASCULAR A:  Shock, recurrent-Now resolved Sinus tachycardia, resolved Hypertension P:  Monitor SBP goal < 170 mmHg  RENAL A:   AKI  Rhabdomyolysis - resolved Severe lactic acidosis, resolved Severe hypervolemia P:   Monitor  BMET intermittently Monitor I/Os Correct electrolytes as indicated Nephrology following Daily HD unitl volume status improves Trial of Lasix X 1 04/10 Recheck CK in AM 04/11  GASTROINTESTINAL A:   Heme + stools Mildly elevated LFTs Post extubation dysphagia P:   SUP: N/I post extubation NPO post extubation SLP eval 04/11 AM  GU A: Scrotal edema P: -Monitor for skin breakdown  -Support to relief pressure  HEMATOLOGIC A:   Polycythemia, resolved ICU acquired anemia  S/P PRBCs 04/05, 04/09 Severe thrombocytopenia, resolved P:  DVT px: SQ heparin (initiated 04/10) Monitor CBC intermittently Transfuse per usual guidelines Monitor plt count closely on SQ heparin  INFECTIOUS A:   Severe sepsis - resolving Stenotrophomonas PNA P:   Monitor temp, WBC count Micro and abx as above  ENDOCRINE A:   Mild stress induced hyperglycemia, resolved P:   Cont CBG q 8 hrs with no SSI Consider resuming SSI for blood glucose consistently > 180  NEUROLOGIC A:   Acute ICU acquired encephalopathy Acute pain 2/2 to rhabdomyolysis Agitation-likely pain related ICU/vent associated discomfort P:   RASS goal: 0 Transition to Duragesic patch 04/10 DC midaz infusion  Lorazepam PRN 04/10  Wife udated @ bedside CCM time: 45 mins The above time includes time spent in consultation with patient and/or family members and reviewing care plan on multidisciplinary rounds  Billy Fischer, MD PCCM service Mobile [phone] Pager [phone]

## 2015-09-10 NOTE — Progress Notes (Signed)
[facility]  ROUNDING NOTE   Subjective:  Patient now extubated. He had hemodialysis yesterday and ultrafiltration of 2.4 kg was performed. Urine output was only 300 cc over the preceding 24 hours.    Objective:  Vital signs in last 24 hours:  Temp:  [98.4 F (36.9 C)-100 F (37.8 C)] 98.6 F (37 C) (04/10 1000) Pulse Rate:  [97-133] 107 (04/10 1000) Resp:  [11-21] 14 (04/10 1000) BP: (103-152)/(62-94) 127/81 mmHg (04/10 1000) SpO2:  [95 %-100 %] 98 % (04/10 1000) FiO2 (%):  [35 %] 35 % (04/10 0800) Weight:  [117 kg (257 lb 15 oz)] 117 kg (257 lb 15 oz) (04/09 1200)  Weight change: 4.3 kg (9 lb 7.7 oz) Filed Weights   09/08/15 1844 09/09/15 0601 09/09/15 1200  Weight: 112.7 kg (248 lb 7.3 oz) 111 kg (244 lb 11.4 oz) 117 kg (257 lb 15 oz)    Intake/Output: I/O last 3 completed shifts: In: 2925 [I.V.:518; Blood:277; NG/GT:1980; IV Piggyback:150] Out: 3675 [Urine:375; Other:2400; Stool:900]   Intake/Output this shift:  Total I/O In: 67 [I.V.:5; IV Piggyback:62] Out: -   Physical Exam: General: Critically ill   Head: /AT EOMI hearing intact  Eyes: EOMI  Neck: trachea midline, left subclavian triple lumen  Lungs:  Bilateral rhonchi  Heart: S1S2 no rubs  Abdomen:  Soft, nontender  Extremities: ++edema,  Neurologic: Awake, alertf, following commands  Skin: Mottling right hand and b/l feet  Access: Left IJ dialysis cathter (4/2) Dr Sung Amabile    Basic Metabolic Panel:  Recent Labs Lab 09/06/15 0503  09/06/15 1124 09/07/15 0515 09/08/15 0500 09/09/15 0520 09/10/15 0604  NA  --   < > 136 135 132* 138 138  K  --   < > 5.0 5.4* 5.0 4.8 4.7  CL  --   < > 107 104 98* 101 101  CO2  --   < > GLUCOSE  --   < > 175* 151* 150* 189* 128*  BUN  --   < > 89* 80* 73* 78* 77*  CREATININE  --   < > 3.31* 3.31* 3.42* 3.55* 3.68*  CALCIUM  --   < > 8.0* 7.6* 7.1* 7.3* 7.5*  MG 2.6*  --   --  2.5* 2.7* 2.8* 2.8*  PHOS 3.4  --   --  4.2 6.2* 8.1*  7.2*  < > = values in this interval not displayed.  Liver Function Tests:  Recent Labs Lab 09/06/15 1124 09/07/15 0515 09/10/15 0604  AST 104* 103* 45*  ALT 58 57 42  ALKPHOS 98 87 67  BILITOT 0.5 0.5 0.5  PROT 5.1* 5.0* 5.1*  ALBUMIN 2.6* 2.4* 2.7*   No results for input(s): LIPASE, AMYLASE in the last 168 hours. No results for input(s): AMMONIA in the last 168 hours.  CBC:  Recent Labs Lab 09/06/15 0503 09/07/15 0515 09/08/15 0848 09/09/15 0520 09/10/15 0604  WBC 18.3* 19.0* 19.1* 13.7* 18.5*  NEUTROABS 16.2*  --   --   --   --   HGB 7.7* 7.2* 7.0* 6.2* 7.5*  HCT 23.5* 22.3* 21.8* 19.2* 23.2*  MCV 92.9 90.9 91.6 91.8 91.4  PLT 69* 96* 128* 128* 148*    Cardiac Enzymes:  Recent Labs Lab 09/05/15 0727 09/10/15 0604  CKTOTAL 2906* 801*    BNP: Invalid input(s): POCBNP  CBG:  Recent Labs Lab 09/09/15 0709 09/09/15 1551 09/09/15 1619 09/10/15 0019 09/10/15 0732  GLUCAP 140* 143* 147* 102* 114*  Microbiology: Results for orders placed or performed during the hospital encounter of 08/27/15  Rapid Influenza A&B Antigens ([facility] only)     Status: None   Collection Time: 08/27/15 12:22 PM  Result Value Ref Range Status   Influenza A ([facility]) NEGATIVE NEGATIVE Final   Influenza B ([facility]) NEGATIVE NEGATIVE Final  Urine culture     Status: None   Collection Time: 08/27/15  7:40 PM  Result Value Ref Range Status   Specimen Description URINE, RANDOM  Final   Special Requests Normal  Final   Culture NO GROWTH 1 DAY  Final   Report Status 08/29/2015 FINAL  Final  MRSA PCR Screening     Status: None   Collection Time: 08/28/15  5:25 PM  Result Value Ref Range Status   MRSA by PCR NEGATIVE NEGATIVE Final    Comment:        The GeneXpert MRSA Assay (FDA approved for NASAL specimens only), is one component of a comprehensive MRSA colonization surveillance program. It is not intended to diagnose MRSA infection nor to guide or monitor treatment for MRSA  infections.   Culture, blood (Routine X 2) w Reflex to ID Panel     Status: None   Collection Time: 08/28/15  5:51 PM  Result Value Ref Range Status   Specimen Description BLOOD RIGHT HAND  Final   Special Requests BAA,ANA,AER,5ML  Final   Culture NO GROWTH 5 DAYS  Final   Report Status 09/02/2015 FINAL  Final  Culture, blood (Routine X 2) w Reflex to ID Panel     Status: None   Collection Time: 08/28/15  5:51 PM  Result Value Ref Range Status   Specimen Description BLOOD RIGHT ASSIST CONTROL  Final   Special Requests BAA,ANA,AER,5ML  Final   Culture NO GROWTH 5 DAYS  Final   Report Status 09/02/2015 FINAL  Final  Culture, respiratory (NON-Expectorated)     Status: None   Collection Time: 08/29/15  2:16 PM  Result Value Ref Range Status   Specimen Description TRACHEAL ASPIRATE  Final   Special Requests NONE  Final   Gram Stain FEW WBC SEEN FEW YEAST   Final   Culture Consistent with normal respiratory flora.  Final   Report Status 09/01/2015 FINAL  Final  Culture, respiratory (NON-Expectorated)     Status: None   Collection Time: 08/31/15  3:45 PM  Result Value Ref Range Status   Specimen Description TRACHEAL ASPIRATE  Final   Special Requests NONE  Final   Gram Stain   Final    FEW WBC SEEN MANY GRAM POSITIVE RODS MODERATE GRAM POSITIVE COCCI RARE YEAST    Culture HEAVY GROWTH ENTEROCOCCUS FAECALIS  Final   Report Status 09/03/2015 FINAL  Final   Organism ID, Bacteria ENTEROCOCCUS FAECALIS  Final      Susceptibility   Enterococcus faecalis - MIC*    AMPICILLIN <=2 SENSITIVE Sensitive     VANCOMYCIN 1 SENSITIVE Sensitive     GENTAMICIN SYNERGY SENSITIVE Sensitive     LINEZOLID 2 SENSITIVE Sensitive     * HEAVY GROWTH ENTEROCOCCUS FAECALIS  Culture, blood (Routine X 2) w Reflex to ID Panel     Status: None   Collection Time: 09/02/15  3:18 PM  Result Value Ref Range Status   Specimen Description BLOOD LINE  Final   Special Requests   Final    BOTTLES DRAWN AEROBIC  AND ANAEROBIC  AER 4CC ANA 1CC   Culture NO GROWTH 5 DAYS  Final  Report Status 09/07/2015 FINAL  Final  Culture, blood (Routine X 2) w Reflex to ID Panel     Status: None   Collection Time: 09/02/15  3:20 PM  Result Value Ref Range Status   Specimen Description BLOOD RIGHT ARM  Final   Special Requests BOTTLES DRAWN AEROBIC AND ANAEROBIC  8CC  Final   Culture NO GROWTH 5 DAYS  Final   Report Status 09/07/2015 FINAL  Final  Culture, respiratory (NON-Expectorated)     Status: None   Collection Time: 09/02/15  4:30 PM  Result Value Ref Range Status   Specimen Description TRACHEAL ASPIRATE  Final   Special Requests NONE  Final   Gram Stain   Final    MODERATE WBC SEEN FEW GRAM POSITIVE COCCI IN PAIRS    Culture LIGHT GROWTH STENOTROPHOMONAS MALTOPHILIA  Final   Report Status 09/06/2015 FINAL  Final   Organism ID, Bacteria STENOTROPHOMONAS MALTOPHILIA  Final      Susceptibility   Stenotrophomonas maltophilia - MIC*    LEVOFLOXACIN <=0.12 SENSITIVE Sensitive     TRIMETH/SULFA <=20 SENSITIVE Sensitive     * LIGHT GROWTH STENOTROPHOMONAS MALTOPHILIA  C difficile quick scan w PCR reflex     Status: None   Collection Time: 09/02/15  4:56 PM  Result Value Ref Range Status   C Diff antigen NEGATIVE NEGATIVE Final   C Diff toxin NEGATIVE NEGATIVE Final   C Diff interpretation Negative for C. difficile  Final    Coagulation Studies: No results for input(s): LABPROT, INR in the last 72 hours.  Urinalysis: No results for input(s): COLORURINE, LABSPEC, PHURINE, GLUCOSEU, HGBUR, BILIRUBINUR, KETONESUR, PROTEINUR, UROBILINOGEN, NITRITE, LEUKOCYTESUR in the last 72 hours.  Invalid input(s): APPERANCEUR    Imaging: Dg Chest Port 1 View  09/10/2015  CLINICAL DATA:  Respiratory failure. EXAM: PORTABLE CHEST 1 VIEW COMPARISON:  09/09/2015. FINDINGS: Endotracheal tube, NG tube, left IJ line, left subclavian line in stable position.Heart size is stable. Diffuse bilateral pulmonary infiltrates,  left side greater than right consistent with pulmonary edema and/or pneumonia. Low lung volumes with basilar atelectasis. Small left pleural effusion. No pneumothorax. IMPRESSION: 1. Lines and tubes in stable position. 2. Borderline cardiomegaly with bilateral pulmonary infiltrates, left side greater than right, again noted. Pulmonary infiltrates consistent with pulmonary edema and/or pneumonia. Small left pleural effusion. Similar findings on prior exam. 3.  Low lung volumes with basilar atelectasis. Electronically Signed   By: Maisie Fus  Register   On: 09/10/2015 07:23   Dg Chest Port 1 View  09/09/2015  CLINICAL DATA:  Acute respiratory failure. EXAM: PORTABLE CHEST 1 VIEW COMPARISON:  09/08/2015 FINDINGS: Stable appearance of the endotracheal tube, 3.5 cm above the carina. Hazy airspace densities in both lungs, left side greater than right. The left hemidiaphragm is obscured by left basilar densities. Heart size is probably upper limits of normal. Left jugular dialysis catheter tip in the SVC. Nasogastric tube extends into the abdomen. Negative for a pneumothorax. Left subclavian central line tip is near the upper SVC. IMPRESSION: Persistent interstitial and airspace densities, left side greater than right. Minimal change from the previous examination. Persistent consolidation at the left lung base could reflect a combination of pleural fluid and airspace disease/ atelectasis. Support apparatuses as described. Electronically Signed   By: Richarda Overlie M.D.   On: 09/09/2015 07:57     Medications:     . antiseptic oral rinse  7 mL Mouth Rinse BID  . fentaNYL  75 mcg Transdermal Q72H  . furosemide  120 mg Intravenous Once  . heparin subcutaneous  5,000 Units Subcutaneous 3 times per day  . HYDROmorphone       acetaminophen **OR** acetaminophen, albuterol, bisacodyl, hydrALAZINE, HYDROmorphone (DILAUDID) injection, LORazepam, sodium chloride, sodium chloride flush  Assessment/ Plan:  John Hoover  is a 50 y.o. white male with no specific past medical history, who was [facility] 08[facility]27[facility]2015 + Ibuprofen use prior to admission (600 mg TID x 2 days)  1. Acute Renal Failure, oliguric. Likely secondary to ATN 2. Metabolic Acidosis 3. Rhabdomyolysis with hyperphosphatemia 4. Acute respiratory failure, E faecalis (3/31), extubated 09/10/15. 5. Influenza A positive 6. Hypoalbuminemia 7. Anasarca/Generalized edema 8. Anemia and Thrombocytopenia (hematology evaluation ongoing) 9. Hyperkalemia.  Plan: Patient now extubated and doing quite well. Creatinine currently 3.68. Patient had hemodialysis yesterday and ultrafiltration achieved was 2.4 kg. We will plan for hemodialysis again today given oliguria. However we remain hopeful that his renal function will improve slowly over time. He is now extubated and is breathing comfortably. Patient also to be administered Lasix 120 mg IV 1 to see if we can convert his oliguria. We will continue to monitor his progress very closely.   LOS: 14 John Hoover 4/10/201710:26 AM

## 2015-09-11 DIAGNOSIS — R410 Disorientation, unspecified: Secondary | ICD-10-CM

## 2015-09-11 LAB — COMPREHENSIVE METABOLIC PANEL
ALT: 35 U/L (ref 17–63)
AST: 39 U/L (ref 15–41)
Albumin: 2.4 g/dL — ABNORMAL LOW (ref 3.5–5.0)
Alkaline Phosphatase: 51 U/L (ref 38–126)
Anion gap: 7 (ref 5–15)
BILIRUBIN TOTAL: 0.5 mg/dL (ref 0.3–1.2)
BUN: 56 mg/dL — AB (ref 6–20)
CO2: 31 mmol/L (ref 22–32)
CREATININE: 3.15 mg/dL — AB (ref 0.61–1.24)
Calcium: 7.6 mg/dL — ABNORMAL LOW (ref 8.9–10.3)
Chloride: 102 mmol/L (ref 101–111)
GFR calc Af Amer: 25 mL/min — ABNORMAL LOW (ref >60)
GFR, EST NON AFRICAN AMERICAN: 21 mL/min — AB (ref >60)
Glucose, Bld: 90 mg/dL (ref 65–99)
Potassium: 3.8 mmol/L (ref 3.5–5.1)
Sodium: 140 mmol/L (ref 135–145)
TOTAL PROTEIN: 4.5 g/dL — AB (ref 6.5–8.1)

## 2015-09-11 LAB — CBC
HEMATOCRIT: 22 % — AB (ref 40.0–52.0)
HEMOGLOBIN: 7.1 g/dL — AB (ref 13.0–18.0)
MCH: 29.8 pg (ref 26.0–34.0)
MCHC: 32.4 g/dL (ref 32.0–36.0)
MCV: 91.9 fL (ref 80.0–100.0)
Platelets: 128 10*3/uL — ABNORMAL LOW (ref 150–440)
RBC: 2.39 MIL/uL — ABNORMAL LOW (ref 4.40–5.90)
RDW: 15.8 % — AB (ref 11.5–14.5)
WBC: 11.7 10*3/uL — ABNORMAL HIGH (ref 3.8–10.6)

## 2015-09-11 LAB — TYPE AND SCREEN
ABO/RH(D): O POS
Antibody Screen: NEGATIVE
Unit division: 0

## 2015-09-11 LAB — GLUCOSE, CAPILLARY
Glucose-Capillary: 70 mg/dL (ref 65–99)
Glucose-Capillary: 83 mg/dL (ref 65–99)
Glucose-Capillary: 86 mg/dL (ref 65–99)
Glucose-Capillary: 85 mg/dL (ref 65–99)

## 2015-09-11 LAB — CK: CK TOTAL: 764 U/L — AB (ref 49–397)

## 2015-09-11 MED ORDER — ALTEPLASE 2 MG IJ SOLR: 2.0000 mg | Freq: Once | INTRAMUSCULAR | Status: DC | PRN

## 2015-09-11 MED ORDER — HEPARIN SODIUM (PORCINE) 1000 UNIT/ML DIALYSIS
1000.0000 [IU] | INTRAMUSCULAR | Status: DC | PRN
  Filled 2015-09-11: qty 1

## 2015-09-11 MED ORDER — SODIUM CHLORIDE 0.9 % IV SOLN: 100.0000 mL | INTRAVENOUS | Status: DC | PRN

## 2015-09-11 MED ORDER — SULFAMETHOXAZOLE-TRIMETHOPRIM 400-80 MG/5ML IV SOLN
940.0000 mg | Freq: Once | INTRAVENOUS | Status: AC
  Administered 2015-09-11: 940 mg via INTRAVENOUS
  Filled 2015-09-11: qty 58.8

## 2015-09-11 MED ORDER — HYDROMORPHONE HCL 1 MG/ML IJ SOLN: 1.0000 mg | INTRAMUSCULAR | Status: DC | PRN

## 2015-09-11 MED ORDER — SULFAMETHOXAZOLE-TRIMETHOPRIM 400-80 MG/5ML IV SOLN
940.0000 mg | Freq: Once | INTRAVENOUS | Status: DC
  Filled 2015-09-11: qty 58.8

## 2015-09-11 MED ORDER — LORAZEPAM 2 MG/ML IJ SOLN: 0.5000 mg | INTRAMUSCULAR | Status: DC | PRN

## 2015-09-11 NOTE — Progress Notes (Signed)
Pre dialysis assessment 

## 2015-09-11 NOTE — Progress Notes (Signed)
[facility]  ROUNDING NOTE   Subjective:  Patient seen at bedside. A bit confused this AM.  Had HD yesterday. Due for HD today as well.     Objective:  Vital signs in last 24 hours:  Temp:  [97.9 F (36.6 C)-99 F (37.2 C)] 98.9 F (37.2 C) (04/11 0800) Pulse Rate:  [86-114] 95 (04/11 0800) Resp:  [10-24] 15 (04/11 0800) BP: (115-136)/(56-82) 117/57 mmHg (04/11 0800) SpO2:  [91 %-100 %] 94 % (04/11 0800) Weight:  [110 kg (242 lb 8.1 oz)-113.6 kg (250 lb 7.1 oz)] 110 kg (242 lb 8.1 oz) (04/10 1930)  Weight change: -3.4 kg (-7 lb 7.9 oz) Filed Weights   09/09/15 1200 09/10/15 1430 09/10/15 1930  Weight: 117 kg (257 lb 15 oz) 113.6 kg (250 lb 7.1 oz) 110 kg (242 lb 8.1 oz)    Intake/Output: I/O last 3 completed shifts: In: 962.4 [I.V.:140.4; NG/GT:660; IV Piggyback:162] Out: 2960 [Urine:360; Other:1800; Stool:800]   Intake/Output this shift:     Physical Exam: General: No acute distress  Head: [place]/AT EOMI hearing intact  Eyes: EOMI  Neck: trachea midline, left subclavian triple lumen  Lungs:  Bilateral rhonchi  Heart: S1S2 no rubs  Abdomen:  Soft, nontender  Extremities: ++edema  Neurologic: Awake, alert, following commands  Skin: Mottling right hand and b/l feet  Access: Left IJ dialysis cathter (4/2) Dr Sung AmabileSimonds    Basic Metabolic Panel:  Recent Labs Lab 09/06/15 0503  09/07/15 0515 09/08/15 0500 09/09/15 0520 09/10/15 0604 09/11/15 0446  NA  --   < > 135 132* 138 138 140  K  --   < > 5.4* 5.0 4.8 4.7 3.8  CL  --   < > 104 98* 101 101 102  CO2  --   < > 27 26 29 29 31   GLUCOSE  --   < > 151* 150* 189* 128* 90  BUN  --   < > 80* 73* 78* 77* 56*  CREATININE  --   < > 3.31* 3.42* 3.55* 3.68* 3.15*  CALCIUM  --   < > 7.6* 7.1* 7.3* 7.5* 7.6*  MG 2.6*  --  2.5* 2.7* 2.8* 2.8*  --   PHOS 3.4  --  4.2 6.2* 8.1* 7.2*  --   < > = values in this interval not displayed.  Liver Function Tests:  Recent Labs Lab 09/06/15 1124 09/07/15 0515  09/10/15 0604 09/11/15 0446  AST 104* 103* 45* 39  ALT 58 57 42 35  ALKPHOS 98 87 67 51  BILITOT 0.5 0.5 0.5 0.5  PROT 5.1* 5.0* 5.1* 4.5*  ALBUMIN 2.6* 2.4* 2.7* 2.4*   No results for input(s): LIPASE, AMYLASE in the last 168 hours. No results for input(s): AMMONIA in the last 168 hours.  CBC:  Recent Labs Lab 09/06/15 0503 09/07/15 0515 09/08/15 0848 09/09/15 0520 09/10/15 0604 09/11/15 0446  WBC 18.3* 19.0* 19.1* 13.7* 18.5* 11.7*  NEUTROABS 16.2*  --   --   --   --   --   HGB 7.7* 7.2* 7.0* 6.2* 7.5* 7.1*  HCT 23.5* 22.3* 21.8* 19.2* 23.2* 22.0*  MCV 92.9 90.9 91.6 91.8 91.4 91.9  PLT 69* 96* 128* 128* 148* 128*    Cardiac Enzymes:  Recent Labs Lab 09/05/15 0727 09/10/15 0604 09/11/15 0446  CKTOTAL 2906* 801* 764*    BNP: Invalid input(s): POCBNP  CBG:  Recent Labs Lab 09/09/15 1619 09/10/15 0019 09/10/15 0732 09/11/15 0101 09/11/15 0451  GLUCAP 147* 102*  114* 70 83    Microbiology: Results for orders placed or performed during the hospital encounter of 08/27/15  Rapid Influenza A&B Antigens ([facility] only)     Status: None   Collection Time: 08/27/15 12:22 PM  Result Value Ref Range Status   Influenza A ([facility]) NEGATIVE NEGATIVE Final   Influenza B ([facility]) NEGATIVE NEGATIVE Final  Urine culture     Status: None   Collection Time: 08/27/15  7:40 PM  Result Value Ref Range Status   Specimen Description URINE, RANDOM  Final   Special Requests Normal  Final   Culture NO GROWTH 1 DAY  Final   Report Status 08/29/2015 FINAL  Final  MRSA PCR Screening     Status: None   Collection Time: 08/28/15  5:25 PM  Result Value Ref Range Status   MRSA by PCR NEGATIVE NEGATIVE Final    Comment:        The GeneXpert MRSA Assay (FDA approved for NASAL specimens only), is one component of a comprehensive MRSA colonization surveillance program. It is not intended to diagnose MRSA infection nor to guide or monitor treatment for MRSA infections.   Culture,  blood (Routine X 2) w Reflex to ID Panel     Status: None   Collection Time: 08/28/15  5:51 PM  Result Value Ref Range Status   Specimen Description BLOOD RIGHT HAND  Final   Special Requests BAA,ANA,AER,5ML  Final   Culture NO GROWTH 5 DAYS  Final   Report Status 09/02/2015 FINAL  Final  Culture, blood (Routine X 2) w Reflex to ID Panel     Status: None   Collection Time: 08/28/15  5:51 PM  Result Value Ref Range Status   Specimen Description BLOOD RIGHT ASSIST CONTROL  Final   Special Requests BAA,ANA,AER,5ML  Final   Culture NO GROWTH 5 DAYS  Final   Report Status 09/02/2015 FINAL  Final  Culture, respiratory (NON-Expectorated)     Status: None   Collection Time: 08/29/15  2:16 PM  Result Value Ref Range Status   Specimen Description TRACHEAL ASPIRATE  Final   Special Requests NONE  Final   Gram Stain FEW WBC SEEN FEW YEAST   Final   Culture Consistent with normal respiratory flora.  Final   Report Status 09/01/2015 FINAL  Final  Culture, respiratory (NON-Expectorated)     Status: None   Collection Time: 08/31/15  3:45 PM  Result Value Ref Range Status   Specimen Description TRACHEAL ASPIRATE  Final   Special Requests NONE  Final   Gram Stain   Final    FEW WBC SEEN MANY GRAM POSITIVE RODS MODERATE GRAM POSITIVE COCCI RARE YEAST    Culture HEAVY GROWTH ENTEROCOCCUS FAECALIS  Final   Report Status 09/03/2015 FINAL  Final   Organism ID, Bacteria ENTEROCOCCUS FAECALIS  Final      Susceptibility   Enterococcus faecalis - MIC*    AMPICILLIN <=2 SENSITIVE Sensitive     VANCOMYCIN 1 SENSITIVE Sensitive     GENTAMICIN SYNERGY SENSITIVE Sensitive     LINEZOLID 2 SENSITIVE Sensitive     * HEAVY GROWTH ENTEROCOCCUS FAECALIS  Culture, blood (Routine X 2) w Reflex to ID Panel     Status: None   Collection Time: 09/02/15  3:18 PM  Result Value Ref Range Status   Specimen Description BLOOD LINE  Final   Special Requests   Final    BOTTLES DRAWN AEROBIC AND ANAEROBIC  AER 4CC ANA  1CC   Culture  NO GROWTH 5 DAYS  Final   Report Status 09/07/2015 FINAL  Final  Culture, blood (Routine X 2) w Reflex to ID Panel     Status: None   Collection Time: 09/02/15  3:20 PM  Result Value Ref Range Status   Specimen Description BLOOD RIGHT ARM  Final   Special Requests BOTTLES DRAWN AEROBIC AND ANAEROBIC  8CC  Final   Culture NO GROWTH 5 DAYS  Final   Report Status 09/07/2015 FINAL  Final  Culture, respiratory (NON-Expectorated)     Status: None   Collection Time: 09/02/15  4:30 PM  Result Value Ref Range Status   Specimen Description TRACHEAL ASPIRATE  Final   Special Requests NONE  Final   Gram Stain   Final    MODERATE WBC SEEN FEW GRAM POSITIVE COCCI IN PAIRS    Culture LIGHT GROWTH STENOTROPHOMONAS MALTOPHILIA  Final   Report Status 09/06/2015 FINAL  Final   Organism ID, Bacteria STENOTROPHOMONAS MALTOPHILIA  Final      Susceptibility   Stenotrophomonas maltophilia - MIC*    LEVOFLOXACIN <=0.12 SENSITIVE Sensitive     TRIMETH/SULFA <=20 SENSITIVE Sensitive     * LIGHT GROWTH STENOTROPHOMONAS MALTOPHILIA  C difficile quick scan w PCR reflex     Status: None   Collection Time: 09/02/15  4:56 PM  Result Value Ref Range Status   C Diff antigen NEGATIVE NEGATIVE Final   C Diff toxin NEGATIVE NEGATIVE Final   C Diff interpretation Negative for C. difficile  Final    Coagulation Studies: No results for input(s): LABPROT, INR in the last 72 hours.  Urinalysis: No results for input(s): COLORURINE, LABSPEC, PHURINE, GLUCOSEU, HGBUR, BILIRUBINUR, KETONESUR, PROTEINUR, UROBILINOGEN, NITRITE, LEUKOCYTESUR in the last 72 hours.  Invalid input(s): APPERANCEUR    Imaging: Dg Chest Port 1 View  09/10/2015  CLINICAL DATA:  Respiratory failure. EXAM: PORTABLE CHEST 1 VIEW COMPARISON:  09/09/2015. FINDINGS: Endotracheal tube, NG tube, left IJ line, left subclavian line in stable position.Heart size is stable. Diffuse bilateral pulmonary infiltrates, left side greater than  right consistent with pulmonary edema and/or pneumonia. Low lung volumes with basilar atelectasis. Small left pleural effusion. No pneumothorax. IMPRESSION: 1. Lines and tubes in stable position. 2. Borderline cardiomegaly with bilateral pulmonary infiltrates, left side greater than right, again noted. Pulmonary infiltrates consistent with pulmonary edema and/or pneumonia. Small left pleural effusion. Similar findings on prior exam. 3.  Low lung volumes with basilar atelectasis. Electronically Signed   By: Maisie Fus  Register   On: 09/10/2015 07:23     Medications:     . antiseptic oral rinse  7 mL Mouth Rinse BID  . fentaNYL  75 mcg Transdermal Q72H  . heparin subcutaneous  5,000 Units Subcutaneous 3 times per day   acetaminophen **OR** acetaminophen, albuterol, bisacodyl, hydrALAZINE, HYDROmorphone (DILAUDID) injection, LORazepam, sodium chloride, sodium chloride flush  Assessment/ Plan:  John Hoover is a 50 y.o. white male with no specific past medical history, who was admitted to [facility] on 08/27/2015 + Ibuprofen use prior to admission (600 mg TID x 2 days)  1. Acute Renal Failure, oliguric. Likely secondary to ATN 2. Metabolic Acidosis 3. Rhabdomyolysis with hyperphosphatemia 4. Acute respiratory failure, E faecalis (3/31), extubated 09/10/15. 5. Influenza A positive 6. Hypoalbuminemia 7. Anasarca/Generalized edema 8. Anemia and Thrombocytopenia (hematology evaluation ongoing) 9. Hyperkalemia.  Plan: Patient remains oliguric at this point in time. Therefore we will proceed with another hemodialysis treatment today. He appears to be a bit more confused today however  he is following commands and received Ativan earlier. Urine output was found to be 360 cc over the preceding 24 hours. Continue to monitor urine output trend. We will continue to monitor his progress along with you.   LOS: 15 Lear Carstens 4/11/20178:47 AM

## 2015-09-11 NOTE — Progress Notes (Signed)
PULMONARY / CRITICAL CARE MEDICINE   Name: John Hoover MRN: [id number] DOB: 1965/08/19    ADMISSION DATE:  08/27/2015 CONSULTATION DATE:  08/28/15   PT PROFILE: John Hoover previously healthy [age] M admitted to gen med floor with several days of malaise and profound myalgias. Influenza PCR positive. On admission was noted to be hemoconcentrated with AKI and elevated CPK. On 03/28 developed progressive AMS and found to have profound metabolic acidosis. Transferred to ICU and PCCM consulted for management of acidosis, rhabdomyolysis, MODS, shock. Intubated, CVL placed and HD cath placed on arrival to ICU. Empiric abx initiated  SUBJECTIVE: Agitation and complaints of pain overnight. Given two extra doses of hydromorphone with moderate relief. Tolerated >16h of PSV during the day; placed back on PRVC overnight. Elink contacted and recommended starting diprivan gtt; held due to moderate improvement in symptoms.  HD yesterday with removal of 2L of fluids. Transfused 1 unit of PRBCs for H/H 6.2/19.2. Nods in approval to pain.   MAJOR EVENTS/TEST RESULTS: 03/27 Admitted as above 03/28 transferred to ICU, intubated, CVL placed, HD cath placed, vasopressors initiated, Renal consult, CRRT initiated, HCO3 gtt initiated, empiric abx initated 03/28 CT head: NAD 03/28 Echocardiogram: LVEF 60-65%, grade one diastolic dysfunction 03/28 CTAP: no acute findings 03/30 BLE venous US: no DVT 03/31 Acidosis much improved. CRRT stopped, HCO3 gtt stopped 03/31 Transition off midaz infusion to dexmedetomidine 04/01 CRRT resumed. Back on low dose fentanyl infusion. Failed SBT. Trial furosemide with minimal response 04/02 Increasing agitation. Dexmedetomidine transitioned to propofol. R femoral HD cath not functioning and replaced after platelet transfusion. CXR pattern c/w edema, severe LE and scrotal edema - volume removal via CRRT 04/02 Worsening gas exchange requiring increased vent support and heavier sedation. HD  cath required replacement. Platelets transfused prior to placement. Wife apprised several times over course of day 040/3 remains on CRRT very low UO 04/05 patient failed SAT/SBT due to resp muscle fatigue 04/05 One unit PRBCs transfused for Hgb 6.8 04/06 failed SAT/SBT again, Stenotrophomonas species  04/07 off pressors.  04/08 failed SAT/SBT again, Stenotrophomonas species 04/09 tolerated PSV all day 04/09 One unit PRBCs transfused for Hgb 6.2 04/10 Extubated. Transitioned off hydromorphone gtt to Duragesic patch. Transitioned off midaz gtt to PRN  Lorazepam 04/11 Lethargic. Tolerating extubation. Following commands. Poorly oriented. SLP eval. PT eval  INDWELLING DEVICES:: ETT 03/28 >> 04/10 L [place] CVL 03/28 >>  R femoral HD cath 03/28 >> 04/02 R femoral A-line 03/28 >> out R IJ HD cath 04/02 >>   MICRO DATA: Flu PCR 03/27 >> positive for A, neg for H1N1 MRSA PCR 03/28 >> NEG Urine 03/27 >> NEG Blood 03/28 >> NEG Resp 03/29 >> NOF HIV 03/31 >> NEG Resp 03/31 >>enterococcus faecalis C diff 04/02 >> NEG Resp 04/02 >> stenotrophomonas    ANTIMICROBIALS:  Oseltamivir 03/27 >> 04/01 Ancef 04/04>04/05 Ampicillin 04/04>>04/05  Vanc 03/28 >> 03/31 Cefepime 03/28 >>04/03  Zosyn 04/07 >> 04/10 Bactrim 04/06 >>   SUBJ: RASS -1, + F/C. Poorly oriented  VITAL SIGNS: BP 134/68 mmHg  Pulse 109  Temp(Src) 99.4 F (37.4 C) (Oral)  Resp 15  Ht 6\' 1"  (1.854 m)  Wt 110 kg (242 lb 8.1 oz)  BMI 32.00 kg/m2  SpO2 94%  HEMODYNAMICS:    VENTILATOR SETTINGS:    INTAKE / OUTPUT: I/O last 3 completed shifts: In: 962.4 [I.V.:140.4; NG/GT:660; IV Piggyback:162] Out: 2960 [Urine:360; Other:1800; Stool:800]  PHYSICAL EXAMINATION: General: RASS -1, + F/C Neuro: PERRL, follows commands, moves all extremities HEENT:  WNL Cardiovascular: Reg, no M Lungs: clear anteriorly Abdomen: soft, NT, ND, + BS Ext: warm, improved symmetric BLE edema, several digits with small areas of necrosis  on tips, multiple bullae on BUE and BLE GU: improved scrotal and penile edema  LABS:  BMET  Recent Labs Lab 09/09/15 0520 09/10/15 0604 09/11/15 0446  NA 138 138 140  K 4.8 4.7 3.8  CL 101 101 102  CO2 BUN 78* 77* 56*  CREATININE 3.55* 3.68* 3.15*  GLUCOSE 189* 128* 90    Electrolytes  Recent Labs Lab 09/08/15 0500 09/09/15 0520 09/10/15 0604 09/11/15 0446  CALCIUM 7.1* 7.3* 7.5* 7.6*  MG 2.7* 2.8* 2.8*  --   PHOS 6.2* 8.1* 7.2*  --     CBC  Recent Labs Lab 09/09/15 0520 09/10/15 0604 09/11/15 0446  WBC 13.7* 18.5* 11.7*  HGB 6.2* 7.5* 7.1*  HCT 19.2* 23.2* 22.0*  PLT 128* 148* 128*    Coag's No results for input(s): APTT, INR in the last 168 hours.  Sepsis Markers No results for input(s): LATICACIDVEN, PROCALCITON, O2SATVEN in the last 168 hours.  ABG  Recent Labs Lab 09/06/15 1100 09/07/15 1100 09/08/15 1839  PHART 7.37 7.44 7.48*  PCO2ART 45 39 47  PO2ART 71* 64* 74*    Liver Enzymes  Recent Labs Lab 09/07/15 0515 09/10/15 0604 09/11/15 0446  AST 103* 45* 39  ALT 57 42 35  ALKPHOS 87 67 51  BILITOT 0.5 0.5 0.5  ALBUMIN 2.4* 2.7* 2.4*    Cardiac Enzymes No results for input(s): TROPONINI, PROBNP in the last 168 hours.  Glucose  Recent Labs Lab 09/09/15 1551 09/09/15 1619 09/10/15 0019 09/10/15 0732 09/11/15 0101 09/11/15 0451  GLUCAP 143* 147* 102* 114* 70 83    CXR: NNF   ASSESSMENT / PLAN:  PULMONARY A: Acute hypoxic resp failure, resolved Pulmonary edema pattern - improving Stenotrophomonas VAP P:   Change to SSDU status Supplemental O2 as needed to maintain SpO2 > 92%   CARDIOVASCULAR A:  Shock, resolved Sinus tachycardia, resolved Hypertension, controlled P:  Monitor SBP goal < 170 mmHg  RENAL A:   AKI, oliguric Rhabdomyolysis - CK remains elevated, likely due to poor cearance Severe lactic acidosis, resolved Severe hypervolemia, improving P:   Monitor BMET  intermittently Monitor I/Os Correct electrolytes as indicated Nephrology following Daily HD unitl volume status improves  GASTROINTESTINAL A:   Heme + stools Mildly elevated LFTs Post extubation dysphagia P:   SUP: N/I post extubation NPO post extubation SLP following  GU A: Scrotal edema P: -Monitor for skin breakdown  -Support to relief pressure  HEMATOLOGIC A:   Polycythemia, resolved ICU acquired anemia  S/P PRBCs 04/05, 04/09 Severe thrombocytopenia, resolved P:  DVT px: SQ heparin (initiated 04/10) Monitor CBC intermittently Transfuse per usual guidelines Monitor plt count closely on SQ heparin  INFECTIOUS A:   Severe sepsis - resolving Stenotrophomonas PNA P:   Monitor temp, WBC count Micro and abx as above  ENDOCRINE A:   Mild stress induced hyperglycemia, resolved P:   Cont CBG q 8 hrs with no SSI  NEUROLOGIC A:   Acute ICU acquired encephalopathy - improving Pain Agitation, resolved ICU acquired delirium (hypoactive) P:   RASS goal: 0 DC Duragesic patch  Cont low dose PRN hydromorphone and lorazepam  Wife updated @ bedside CCM time: 30 mins The above time includes time spent in consultation with patient and/or family members and reviewing care plan on multidisciplinary rounds  Billy Fischer,  MD PCCM service Mobile [phone] Pager [phone]

## 2015-09-11 NOTE — Progress Notes (Signed)
This note also relates to the following rows which could not be included: Pulse Rate - Cannot attach notes to unvalidated device data Resp - Cannot attach notes to unvalidated device data BP - Cannot attach notes to unvalidated device data SpO2 - Cannot attach notes to unvalidated device data   Post-dialysis assessment

## 2015-09-11 NOTE — Progress Notes (Signed)
Post dialysis assessment 

## 2015-09-11 NOTE — Progress Notes (Signed)
Pharmacy Antibiotic Note Day 6 Bactrim  John Hoover is a 50 y.o. male admitted on 08/27/2015 with pneumonia. 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 Pharmacy consulted for bactrim.  Plan: Will continue patient on Bactrim 940mg  to be infused after dialysis on dialysis days. Patient currently receiving dialysis daily. Will follow daily and order Bactrim as appropriate.   Height: 6\' 1"  (185.4 cm) Weight: 242 lb 8.1 oz (110 kg) (bed scale) IBW/kg (Calculated) : 79.9  Temp (24hrs), Avg:98.7 F (37.1 C), Min:97.9 F (36.6 C), Max:99.4 F (37.4 C)   Recent Labs Lab 09/07/15 0515 09/08/15 0500 09/08/15 0848 09/09/15 0520 09/10/15 0604 09/11/15 0446  WBC 19.0*  --  19.1* 13.7* 18.5* 11.7*  CREATININE 3.31* 3.42*  --  3.55* 3.68* 3.15*    Estimated Creatinine Clearance: 36.1 mL/min (by C-G formula based on Cr of 3.15).    No Known Allergies  Antimicrobials this admission: Anti-infectives    Start     Dose/Rate Route Frequency Ordered Stop   09/11/15 2000  sulfamethoxazole-trimethoprim (BACTRIM) 940 mg in dextrose 5 % 500 mL IVPB     940 mg 372.5 mL/hr over 90 Minutes Intravenous  Once 09/11/15 1046     09/11/15 1800  sulfamethoxazole-trimethoprim (BACTRIM) 940 mg in dextrose 5 % 500 mL IVPB  Status:  Discontinued     940 mg 372.5 mL/hr over 90 Minutes Intravenous  Once 09/11/15 1031 09/11/15 1046   09/10/15 1800  sulfamethoxazole-trimethoprim (BACTRIM) 940 mg in dextrose 5 % 500 mL IVPB     940 mg 372.5 mL/hr over 90 Minutes Intravenous  Once 09/10/15 1210 09/10/15 2125   09/09/15 1800  sulfamethoxazole-trimethoprim (BACTRIM) 940 mg in dextrose 5 % 500 mL IVPB     940 mg 372.5 mL/hr over 90 Minutes Intravenous  Once 09/09/15 1213 09/09/15 1939   09/08/15 1800  sulfamethoxazole-trimethoprim (BACTRIM) 940 mg in dextrose 5 % 500 mL IVPB     940 mg 372.5 mL/hr over 90 Minutes Intravenous  Once  09/08/15 1318 09/08/15 2013   09/07/15 2200  piperacillin-tazobactam (ZOSYN) IVPB 3.375 g  Status:  Discontinued     3.375 g 12.5 mL/hr over 240 Minutes Intravenous Every 12 hours 09/07/15 1322 09/10/15 0833   09/07/15 1330  sulfamethoxazole-trimethoprim (BACTRIM) 940 mg of trimethoprim in dextrose 5 % 500 mL IVPB  Status:  Discontinued     940 mg of trimethoprim 372.5 mL/hr over 90 Minutes Intravenous  Once 09/07/15 1318 09/07/15 1324   09/07/15 1330  sulfamethoxazole-trimethoprim (BACTRIM) 940 mg of trimethoprim in dextrose 5 % 500 mL IVPB  Status:  Discontinued     940 mg of trimethoprim 372.5 mL/hr over 90 Minutes Intravenous Once in dialysis 09/07/15 1324 09/08/15 1320   09/06/15 1900  sulfamethoxazole-trimethoprim (BACTRIM) 940 mg of trimethoprim in dextrose 5 % 500 mL IVPB     940 mg of trimethoprim 372.5 mL/hr over 90 Minutes Intravenous  Once 09/06/15 1618 09/06/15 2010   09/05/15 1200  piperacillin-tazobactam (ZOSYN) IVPB 3.375 g  Status:  Discontinued     3.375 g 12.5 mL/hr over 240 Minutes Intravenous 3 times per day 09/05/15 1108 09/07/15 1322   09/04/15 1515  ampicillin (OMNIPEN) 2 g in sodium chloride 0.9 % 50 mL IVPB  Status:  Discontinued     2 g 150 mL/hr over 20 Minutes Intravenous 3 times per day 09/04/15 1511 09/05/15 1107   09/03/15 2200  ceFAZolin (ANCEF) IVPB 2g/100 mL premix  Status:  Discontinued     2 g 200 mL/hr over 30 Minutes Intravenous Every 12 hours 09/03/15 1251 09/04/15 1511   09/03/15 1300  ceFAZolin (ANCEF) IVPB 2g/100 mL premix  Status:  Discontinued     2 g 200 mL/hr over 30 Minutes Intravenous Every 12 hours 09/03/15 1250 09/03/15 1251   09/03/15 0030  vancomycin (VANCOCIN) IVPB 750 mg/150 ml premix  Status:  Discontinued     750 mg 150 mL/hr over 60 Minutes Intravenous Every 24 hours 09/02/15 1454 09/03/15 1124   09/02/15 1530  vancomycin (VANCOCIN) IVPB 750 mg/150 ml premix     750 mg 150 mL/hr over 60 Minutes Intravenous  Once 09/02/15 1454  09/02/15 1651   09/02/15 1445  vancomycin (VANCOCIN) 50 mg/mL oral solution 125 mg  Status:  Discontinued     125 mg Per Tube 4 times per day 09/02/15 1433 09/03/15 0052   08/28/15 2200  ceFEPIme (MAXIPIME) 2 g in dextrose 5 % 50 mL IVPB  Status:  Discontinued     2 g 100 mL/hr over 30 Minutes Intravenous Every 12 hours 08/28/15 1726 09/03/15 1124   08/28/15 2200  oseltamivir (TAMIFLU) 6 MG/ML suspension 30 mg  Status:  Discontinued     30 mg Per Tube 2 times daily 08/28/15 1741 08/28/15 1855   08/28/15 2000  vancomycin (VANCOCIN) IVPB 1000 mg/200 mL premix  Status:  Discontinued     1,000 mg 200 mL/hr over 60 Minutes Intravenous Every 24 hours 08/28/15 1838 08/30/15 1637   08/28/15 1900  oseltamivir (TAMIFLU) 6 MG/ML suspension 30 mg  Status:  Discontinued     30 mg Per Tube Daily 08/28/15 1855 09/01/15 1056   08/28/15 1745  vancomycin (VANCOCIN) 1,250 mg in sodium chloride 0.9 % 250 mL IVPB  Status:  Discontinued     1,250 mg 166.7 mL/hr over 90 Minutes Intravenous STAT 08/28/15 1730 08/28/15 1837   08/27/15 2200  oseltamivir (TAMIFLU) capsule 75 mg  Status:  Discontinued     75 mg Oral 2 times daily 08/27/15 1806 08/27/15 1814   08/27/15 1900  cefTRIAXone (ROCEPHIN) 1 g in dextrose 5 % 50 mL IVPB  Status:  Discontinued     1 g 100 mL/hr over 30 Minutes Intravenous Every 24 hours 08/27/15 1806 08/28/15 1420      Microbiology results: Results for orders placed or performed during the hospital encounter of 08/27/15  Rapid Influenza A&B Antigens ([facility] only)     Status: None   Collection Time: 08/27/15 12:22 PM  Result Value Ref Range Status   Influenza A ([facility]) NEGATIVE NEGATIVE Final   Influenza B ([facility]) NEGATIVE NEGATIVE Final  Urine culture     Status: None   Collection Time: 08/27/15  7:40 PM  Result Value Ref Range Status   Specimen Description URINE, RANDOM  Final   Special Requests Normal  Final   Culture NO GROWTH 1 DAY  Final   Report Status 08/29/2015 FINAL  Final  MRSA  PCR Screening     Status: None   Collection Time: 08/28/15  5:25 PM  Result Value Ref Range Status   MRSA by PCR NEGATIVE NEGATIVE Final    Comment:        The GeneXpert MRSA Assay (FDA approved for NASAL specimens only), is one component of a comprehensive MRSA colonization surveillance program. It is not intended to diagnose MRSA infection nor to guide or monitor treatment for MRSA infections.   Culture, blood (Routine X 2) w Reflex to ID  Panel     Status: None   Collection Time: 08/28/15  5:51 PM  Result Value Ref Range Status   Specimen Description BLOOD RIGHT HAND  Final   Special Requests BAA,ANA,AER,5ML  Final   Culture NO GROWTH 5 DAYS  Final   Report Status 09/02/2015 FINAL  Final  Culture, blood (Routine X 2) w Reflex to ID Panel     Status: None   Collection Time: 08/28/15  5:51 PM  Result Value Ref Range Status   Specimen Description BLOOD RIGHT ASSIST CONTROL  Final   Special Requests BAA,ANA,AER,5ML  Final   Culture NO GROWTH 5 DAYS  Final   Report Status 09/02/2015 FINAL  Final  Culture, respiratory (NON-Expectorated)     Status: None   Collection Time: 08/29/15  2:16 PM  Result Value Ref Range Status   Specimen Description TRACHEAL ASPIRATE  Final   Special Requests NONE  Final   Gram Stain FEW WBC SEEN FEW YEAST   Final   Culture Consistent with normal respiratory flora.  Final   Report Status 09/01/2015 FINAL  Final  Culture, respiratory (NON-Expectorated)     Status: None   Collection Time: 08/31/15  3:45 PM  Result Value Ref Range Status   Specimen Description TRACHEAL ASPIRATE  Final   Special Requests NONE  Final   Gram Stain   Final    FEW WBC SEEN MANY GRAM POSITIVE RODS MODERATE GRAM POSITIVE COCCI RARE YEAST    Culture HEAVY GROWTH ENTEROCOCCUS FAECALIS  Final   Report Status 09/03/2015 FINAL  Final   Organism ID, Bacteria ENTEROCOCCUS FAECALIS  Final      Susceptibility   Enterococcus faecalis - MIC*    AMPICILLIN <=2 SENSITIVE  Sensitive     VANCOMYCIN 1 SENSITIVE Sensitive     GENTAMICIN SYNERGY SENSITIVE Sensitive     LINEZOLID 2 SENSITIVE Sensitive     * HEAVY GROWTH ENTEROCOCCUS FAECALIS  Culture, blood (Routine X 2) w Reflex to ID Panel     Status: None   Collection Time: 09/02/15  3:18 PM  Result Value Ref Range Status   Specimen Description BLOOD LINE  Final   Special Requests   Final    BOTTLES DRAWN AEROBIC AND ANAEROBIC  AER 4CC ANA 1CC   Culture NO GROWTH 5 DAYS  Final   Report Status 09/07/2015 FINAL  Final  Culture, blood (Routine X 2) w Reflex to ID Panel     Status: None   Collection Time: 09/02/15  3:20 PM  Result Value Ref Range Status   Specimen Description BLOOD RIGHT ARM  Final   Special Requests BOTTLES DRAWN AEROBIC AND ANAEROBIC  8CC  Final   Culture NO GROWTH 5 DAYS  Final   Report Status 09/07/2015 FINAL  Final  Culture, respiratory (NON-Expectorated)     Status: None   Collection Time: 09/02/15  4:30 PM  Result Value Ref Range Status   Specimen Description TRACHEAL ASPIRATE  Final   Special Requests NONE  Final   Gram Stain   Final    MODERATE WBC SEEN FEW GRAM POSITIVE COCCI IN PAIRS    Culture LIGHT GROWTH STENOTROPHOMONAS MALTOPHILIA  Final   Report Status 09/06/2015 FINAL  Final   Organism ID, Bacteria STENOTROPHOMONAS MALTOPHILIA  Final      Susceptibility   Stenotrophomonas maltophilia - MIC*    LEVOFLOXACIN <=0.12 SENSITIVE Sensitive     TRIMETH/SULFA <=20 SENSITIVE Sensitive     * LIGHT GROWTH STENOTROPHOMONAS MALTOPHILIA  C difficile  quick scan w PCR reflex     Status: None   Collection Time: 09/02/15  4:56 PM  Result Value Ref Range Status   C Diff antigen NEGATIVE NEGATIVE Final   C Diff toxin NEGATIVE NEGATIVE Final   C Diff interpretation Negative for C. difficile  Final    Thank you for allowing pharmacy to be a part of this patient's care.  Simpson,Michael L 09/11/2015 2:49 PM

## 2015-09-11 NOTE — Care Management (Signed)
Received call From Clydie BraunKaren with [facility].  Patient's BC[employer] has given approval for 14 days in  [facility] facility.  Informed attending and patient's wife.   Attending says that patient will be medically stable within the next 48 hours.  Looking for transfer  for 09/13/2015

## 2015-09-11 NOTE — Evaluation (Signed)
Clinical/Bedside Swallow Evaluation Patient Details  Name: John Hoover MRN: [id number] Date of Birth: 26-Jul-1965  Today's Date: 09/11/2015 Time: SLP Start Time (ACUTE ONLY): 1600 SLP Stop Time (ACUTE ONLY): 1700 SLP Time Calculation (min) (ACUTE ONLY): 60 min  Past Medical History:  Past Medical History  Diagnosis Date  . Meniere's disease    Past Surgical History:  Past Surgical History  Procedure Laterality Date  . Nasal sinus surgery     HPI:  Pt was extubated the morning of 09/10/15 post ~13 days of oral intubation. Pt is currently NPO; receiving Dialysis.    Assessment / Plan / Recommendation Clinical Impression  Pt appeared to adequately tolerate trials of Nectar liquids via straw w/ no overt s/s of aspiration and no overt oral phase deficits. Howver, when pt was given trials of increased textures (ice, puree), pt tended to hold the boluses orally and did not immediately demo. manipulation and management for A-P transfer; min. oral holding noted. Pt was given verbal and tactile cues to encourage transfer and swallowing. Given time, cues, and alternating food/liquid, pt did clear orally. Trials of solids and thin liquids were not assessed at this time d/t presenation of oral phase dysphagia which can increase risk for pharyngeal phase dysphagia and aspiration. Rec. a Dys. 1 w/ Nectar liquids; aspiration precautions; Meds in puree - crushed as able. Feeding assistance at all meals. ST will f/u w/ toleration of diet and trials to upgrade as able. NSG updated.      Aspiration Risk  Mild aspiration risk (-moderate aspiration risk)    Diet Recommendation  Dys. 1 w/ Nectar liquids; aspiration precautions; feeding assistance  Medication Administration: Crushed with puree    Other  Recommendations Recommended Consults:  (Dietician) Oral Care Recommendations: Oral care BID;Staff/trained caregiver to provide oral care Other Recommendations: Order thickener from pharmacy;Prohibited  food (jello, ice cream, thin soups);Remove water pitcher   Follow up Recommendations  Skilled Nursing facility (TBD)    Frequency and Duration min 3x week  2 weeks       Prognosis Prognosis for Safe Diet Advancement: Fair Barriers to Reach Goals: Cognitive deficits      Swallow Study   General Date of Onset: 08/27/15 HPI: Pt was extubated the morning of 09/10/15 post ~13 days of oral intubation. Pt is currently NPO; receiving Dialysis.  Type of Study: Bedside Swallow Evaluation Previous Swallow Assessment: none indicated Diet Prior to this Study: Regular;Thin liquids (at home per report; NPO since admission/orally intubated) Temperature Spikes Noted: Yes (temp. recently 100.1; wbc 11.7) Respiratory Status: Nasal cannula (2 liters) History of Recent Intubation: Yes Length of Intubations (days): 13 days Date extubated: 09/10/15 Behavior/Cognition: Cooperative;Confused;Distractible;Requires cueing (awake) Oral Cavity Assessment: Within Functional Limits;Dry Oral Care Completed by SLP: Recent completion by staff Oral Cavity - Dentition: Adequate natural dentition Vision:  (n/a) Self-Feeding Abilities: Total assist Patient Positioning: Upright in bed Baseline Vocal Quality: Low vocal intensity Volitional Cough: Cognitively unable to elicit Volitional Swallow: Unable to elicit    Oral/Motor/Sensory Function Overall Oral Motor/Sensory Function: Within functional limits (appeared so w/ bolus movements)   Ice Chips Ice chips: Impaired Presentation: Spoon (fed; 3 trials) Oral Phase Impairments: Poor awareness of bolus Oral Phase Functional Implications: Oral holding;Prolonged oral transit Pharyngeal Phase Impairments:  (none)   Thin Liquid Thin Liquid: Not tested    Nectar Thick Nectar Thick Liquid: Within functional limits Presentation: Straw (fed; ~3 ozs) Other Comments: straw sipping seemed automatic for pt and he swallowed/cleared appropriately   Honey Thick Honey  Thick  Liquid: Not tested   Puree Puree: Impaired Presentation: Spoon (1/2 tsp trials - 3) Oral Phase Impairments: Poor awareness of bolus Oral Phase Functional Implications: Oral holding;Prolonged oral transit Pharyngeal Phase Impairments:  (none)   Solid   GO   Solid: Not tested       Jerilynn SomKatherine Watson, MS, CCC-SLP  Watson,Katherine 09/11/2015,6:42 PM

## 2015-09-11 NOTE — Progress Notes (Signed)
OT Cancellation Note  Patient Details Name: John Hoover MRN: [id number] DOB: 08-28-1965   Cancelled Treatment:    Reason Eval/Treat Not Completed: Medical issues which prohibited therapy (Hemoglobin 7.1) Will continue to monitor.  John MessierElaine Sherlene Rickel, MS, OTR/L   John Hoover 09/11/2015, 3:34 PM

## 2015-09-11 NOTE — Progress Notes (Signed)
Awake this afternoon and evening watching tv.  Alert to self but confused to place and situation.  Answers questions and follows commands. Stach per cardiac monitor. o2 sats upper 90's on 2L with no respiratory distress noted. 175 mL urine output this shift. Wife and family visited throughout shift.  Dialysis treatment in progress at this time and tolerating.

## 2015-09-11 NOTE — Progress Notes (Signed)
Temperature 101.4, tylenol given.

## 2015-09-11 NOTE — Progress Notes (Signed)
Dr. Craige CottaSood notified of pt Hgb,VSS, no new orders received.

## 2015-09-11 NOTE — Evaluation (Signed)
Physical Therapy Evaluation Patient Details Name: John Hoover MRN: [id number] DOB: 1966-01-20 Today's Date: 09/11/2015   History of Present Illness  Pt is a 50 y.o. male with PMH of meniere's disease.  Pt presented with cough, SOB and hypoxia.  Pt was admitted for UTI, acute renal failure, acute metabolic acidosis and rhabdomyolysis.  Pt was intubated 08-27-15 and extubated 09-10-15.  Pt was observed to have a full thickness wound  between the R 4th and 5th toes (09-09-15).      Clinical Impression  Pt's history was obtained from wife.  Prior to admission pt was independent.  Pt's wife reported that pt ran between 3 to 4 miles per day prior to admission.  Pt lives with wife and children.  Wife works a full time OT job.  Pt was selective and required increased time in following commands.  Pt did not display eye contact during session.  Pt performed UE and LE AROM exercises in bed.  Due to aforementioned function and strength deficits, pt is in need of skilled physical therapy.  It is recommended that pt is discharged to CIR pending progress when medically appropriate.     Follow Up Recommendations CIR(Pending progress)    Equipment Recommendations   (TBD per further assessment )    Recommendations for Other Services       Precautions / Restrictions Precautions Precautions: Other (comment) Precaution Comments: Tempoary L IJ dialysis catheter, rectal tube, triple lumen L subclavian catheter,  Restrictions Weight Bearing Restrictions: No      Mobility  Bed Mobility               General bed mobility comments: not assessed due to fatigue   Transfers                 General transfer comment: not assessed due to fatigue   Ambulation/Gait                Stairs            Wheelchair Mobility    Modified Rankin (Stroke Patients Only)       Balance                                             Pertinent Vitals/Pain Pain Assessment:  No/denies pain  See flow sheet for vitals.     Home Living Family/patient expects to be discharged to:: Private residence Living Arrangements: Spouse/significant other;Children Available Help at Discharge: Family Type of Home: House Home Access: Stairs to enter Entrance Stairs-Rails: None Secretary/administratorntrance Stairs-Number of Steps: 3 Home Layout: Two level (bed room on first floor ) Home Equipment: None      Prior Function Level of Independence: Independent               Hand Dominance        Extremity/Trunk Assessment   Upper Extremity Assessment: Generalized weakness  shoulder flexors and abductors:  B 2+/5 Elbow flexors and elbow extensors:  B at least a 3/5    Grip:  B decreased        Lower Extremity Assessment: Generalized weakness    hip flexors, quadriceps, hamstrings, dorsiflexors: B 2-/5  Cervical / Trunk Assessment: Normal  Communication   Communication: Expressive difficulties  Cognition Arousal/Alertness: Awake/alert Behavior During Therapy: WFL for tasks assessed/performed Overall Cognitive Status: Impaired/Different from baseline Area of Impairment: Attention;Following commands;Awareness  Current Attention Level: Selective   Following Commands: Follows one step commands inconsistently;Follows one step commands with increased time       General Comments: oriented to person and year, did not know month or where he was    General Comments   Nursing was contacted and cleared pt for physical therapy.  Pt was agreeable and session was modified due to fatigue.  Pt's wife and daughter were present for the second half of the session.       Exercises Total Joint Exercises Ankle Circles/Pumps: AROM;Both;10 reps (25% AROM) Quad Sets: AROM;Both;10 reps General Exercises - Upper Extremity Shoulder Flexion: AROM;Both;5 reps (60% AROM) Elbow Flexion: AROM;Both;5 reps Elbow Extension: AROM;Both;5 reps All exercises were performed in bed.        Assessment/Plan    PT Assessment Patient needs continued PT services  PT Diagnosis Generalized weakness   PT Problem List Decreased strength;Decreased range of motion;Decreased activity tolerance;Decreased mobility  PT Treatment Interventions DME instruction;Gait training;Stair training;Functional mobility training;Therapeutic activities;Therapeutic exercise;Patient/family education;Balance training   PT Goals (Current goals can be found in the Care Plan section) Acute Rehab PT Goals Patient Stated Goal: to get stronger  PT Goal Formulation: With family Time For Goal Achievement: 09/25/15 Potential to Achieve Goals: Good    Frequency 7X/week   Barriers to discharge        Co-evaluation               End of Session   Activity Tolerance: Patient limited by fatigue Patient left: in bed;with family/visitor present Nurse Communication: Mobility status;Precautions         Time: 1610-9604 PT Time Calculation (min) (ACUTE ONLY): 23 min   Charges:         PT G Codes:       Lyndel Safe, SPT  Lyndel Safe 09/11/2015, 4:06 PM

## 2015-09-12 ENCOUNTER — Inpatient Hospital Stay: Payer: BLUE CROSS/BLUE SHIELD

## 2015-09-12 LAB — MAGNESIUM: Magnesium: 2.2 mg/dL (ref 1.7–2.4)

## 2015-09-12 LAB — BASIC METABOLIC PANEL
ANION GAP: 8 (ref 5–15)
BUN: 39 mg/dL — ABNORMAL HIGH (ref 6–20)
CALCIUM: 7.5 mg/dL — AB (ref 8.9–10.3)
CHLORIDE: 102 mmol/L (ref 101–111)
CO2: 30 mmol/L (ref 22–32)
Creatinine, Ser: 2.88 mg/dL — ABNORMAL HIGH (ref 0.61–1.24)
GFR calc Af Amer: 28 mL/min — ABNORMAL LOW (ref >60)
GFR calc non Af Amer: 24 mL/min — ABNORMAL LOW (ref >60)
GLUCOSE: 94 mg/dL (ref 65–99)
POTASSIUM: 3.7 mmol/L (ref 3.5–5.1)
Sodium: 140 mmol/L (ref 135–145)

## 2015-09-12 LAB — CBC
HEMATOCRIT: 21.1 % — AB (ref 40.0–52.0)
HEMOGLOBIN: 7 g/dL — AB (ref 13.0–18.0)
MCH: 30.4 pg (ref 26.0–34.0)
MCHC: 33.2 g/dL (ref 32.0–36.0)
MCV: 91.5 fL (ref 80.0–100.0)
Platelets: 135 10*3/uL — ABNORMAL LOW (ref 150–440)
RBC: 2.31 MIL/uL — AB (ref 4.40–5.90)
RDW: 16.1 % — ABNORMAL HIGH (ref 11.5–14.5)
WBC: 8.4 10*3/uL (ref 3.8–10.6)

## 2015-09-12 MED ORDER — HYDROMORPHONE HCL 1 MG/ML IJ SOLN: 0.5000 mg | INTRAMUSCULAR | Status: DC | PRN

## 2015-09-12 MED ORDER — SULFAMETHOXAZOLE-TRIMETHOPRIM 400-80 MG/5ML IV SOLN
940.0000 mg | Freq: Once | INTRAVENOUS | Status: AC
  Administered 2015-09-12: 940 mg via INTRAVENOUS
  Filled 2015-09-12 (×3): qty 58.8

## 2015-09-12 NOTE — Progress Notes (Signed)
Pharmacy Antibiotic Note Day 7 Bactrim  John Hoover is a 50 y.o. male admitted on 08/27/2015 with pneumonia. 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 Pharmacy consulted for bactrim.  Plan: Will continue patient on Bactrim 940mg  to be infused after dialysis on dialysis days. Patient currently receiving dialysis daily. Will follow daily and order Bactrim as appropriate.   Height: 6\' 1"  (185.4 cm) Weight: 229 lb 8 oz (104.1 kg) (bed scale) IBW/kg (Calculated) : 79.9  Temp (24hrs), Avg:100 F (37.8 C), Min:99.2 F (37.3 C), Max:101.4 F (38.6 C)   Recent Labs Lab 09/08/15 0500 09/08/15 0848 09/09/15 0520 09/10/15 0604 09/11/15 0446 09/12/15 0232  WBC  --  19.1* 13.7* 18.5* 11.7* 8.4  CREATININE 3.42*  --  3.55* 3.68* 3.15* 2.88*    Estimated Creatinine Clearance: 38.5 mL/min (by C-G formula based on Cr of 2.88).    No Known Allergies  Antimicrobials this admission: Anti-infectives    Start     Dose/Rate Route Frequency Ordered Stop   09/12/15 2100  sulfamethoxazole-trimethoprim (BACTRIM) 940 mg in dextrose 5 % 500 mL IVPB     940 mg 372.5 mL/hr over 90 Minutes Intravenous  Once 09/12/15 0954     09/11/15 2000  sulfamethoxazole-trimethoprim (BACTRIM) 940 mg in dextrose 5 % 500 mL IVPB     940 mg 372.5 mL/hr over 90 Minutes Intravenous  Once 09/11/15 1046 09/11/15 2339   09/11/15 1800  sulfamethoxazole-trimethoprim (BACTRIM) 940 mg in dextrose 5 % 500 mL IVPB  Status:  Discontinued     940 mg 372.5 mL/hr over 90 Minutes Intravenous  Once 09/11/15 1031 09/11/15 1046   09/10/15 1800  sulfamethoxazole-trimethoprim (BACTRIM) 940 mg in dextrose 5 % 500 mL IVPB     940 mg 372.5 mL/hr over 90 Minutes Intravenous  Once 09/10/15 1210 09/10/15 2125   09/09/15 1800  sulfamethoxazole-trimethoprim (BACTRIM) 940 mg in dextrose 5 % 500 mL IVPB     940 mg 372.5 mL/hr over 90 Minutes Intravenous  Once 09/09/15  1213 09/09/15 1939   09/08/15 1800  sulfamethoxazole-trimethoprim (BACTRIM) 940 mg in dextrose 5 % 500 mL IVPB     940 mg 372.5 mL/hr over 90 Minutes Intravenous  Once 09/08/15 1318 09/08/15 2013   09/07/15 2200  piperacillin-tazobactam (ZOSYN) IVPB 3.375 g  Status:  Discontinued     3.375 g 12.5 mL/hr over 240 Minutes Intravenous Every 12 hours 09/07/15 1322 09/10/15 0833   09/07/15 1330  sulfamethoxazole-trimethoprim (BACTRIM) 940 mg of trimethoprim in dextrose 5 % 500 mL IVPB  Status:  Discontinued     940 mg of trimethoprim 372.5 mL/hr over 90 Minutes Intravenous  Once 09/07/15 1318 09/07/15 1324   09/07/15 1330  sulfamethoxazole-trimethoprim (BACTRIM) 940 mg of trimethoprim in dextrose 5 % 500 mL IVPB  Status:  Discontinued     940 mg of trimethoprim 372.5 mL/hr over 90 Minutes Intravenous Once in dialysis 09/07/15 1324 09/08/15 1320   09/06/15 1900  sulfamethoxazole-trimethoprim (BACTRIM) 940 mg of trimethoprim in dextrose 5 % 500 mL IVPB     940 mg of trimethoprim 372.5 mL/hr over 90 Minutes Intravenous  Once 09/06/15 1618 09/06/15 2010   09/05/15 1200  piperacillin-tazobactam (ZOSYN) IVPB 3.375 g  Status:  Discontinued     3.375 g 12.5 mL/hr over 240 Minutes Intravenous 3 times per day 09/05/15 1108 09/07/15 1322   09/04/15 1515  ampicillin (OMNIPEN) 2 g in sodium chloride 0.9 % 50 mL IVPB  Status:  Discontinued  2 g 150 mL/hr over 20 Minutes Intravenous 3 times per day 09/04/15 1511 09/05/15 1107   09/03/15 2200  ceFAZolin (ANCEF) IVPB 2g/100 mL premix  Status:  Discontinued     2 g 200 mL/hr over 30 Minutes Intravenous Every 12 hours 09/03/15 1251 09/04/15 1511   09/03/15 1300  ceFAZolin (ANCEF) IVPB 2g/100 mL premix  Status:  Discontinued     2 g 200 mL/hr over 30 Minutes Intravenous Every 12 hours 09/03/15 1250 09/03/15 1251   09/03/15 0030  vancomycin (VANCOCIN) IVPB 750 mg/150 ml premix  Status:  Discontinued     750 mg 150 mL/hr over 60 Minutes Intravenous Every 24  hours 09/02/15 1454 09/03/15 1124   09/02/15 1530  vancomycin (VANCOCIN) IVPB 750 mg/150 ml premix     750 mg 150 mL/hr over 60 Minutes Intravenous  Once 09/02/15 1454 09/02/15 1651   09/02/15 1445  vancomycin (VANCOCIN) 50 mg/mL oral solution 125 mg  Status:  Discontinued     125 mg Per Tube 4 times per day 09/02/15 1433 09/03/15 0052   08/28/15 2200  ceFEPIme (MAXIPIME) 2 g in dextrose 5 % 50 mL IVPB  Status:  Discontinued     2 g 100 mL/hr over 30 Minutes Intravenous Every 12 hours 08/28/15 1726 09/03/15 1124   08/28/15 2200  oseltamivir (TAMIFLU) 6 MG/ML suspension 30 mg  Status:  Discontinued     30 mg Per Tube 2 times daily 08/28/15 1741 08/28/15 1855   08/28/15 2000  vancomycin (VANCOCIN) IVPB 1000 mg/200 mL premix  Status:  Discontinued     1,000 mg 200 mL/hr over 60 Minutes Intravenous Every 24 hours 08/28/15 1838 08/30/15 1637   08/28/15 1900  oseltamivir (TAMIFLU) 6 MG/ML suspension 30 mg  Status:  Discontinued     30 mg Per Tube Daily 08/28/15 1855 09/01/15 1056   08/28/15 1745  vancomycin (VANCOCIN) 1,250 mg in sodium chloride 0.9 % 250 mL IVPB  Status:  Discontinued     1,250 mg 166.7 mL/hr over 90 Minutes Intravenous STAT 08/28/15 1730 08/28/15 1837   08/27/15 2200  oseltamivir (TAMIFLU) capsule 75 mg  Status:  Discontinued     75 mg Oral 2 times daily 08/27/15 1806 08/27/15 1814   08/27/15 1900  cefTRIAXone (ROCEPHIN) 1 g in dextrose 5 % 50 mL IVPB  Status:  Discontinued     1 g 100 mL/hr over 30 Minutes Intravenous Every 24 hours 08/27/15 1806 08/28/15 1420      Microbiology results: Results for orders placed or performed during the hospital encounter of 08/27/15  Rapid Influenza A&B Antigens ([facility] only)     Status: None   Collection Time: 08/27/15 12:22 PM  Result Value Ref Range Status   Influenza A ([facility]) NEGATIVE NEGATIVE Final   Influenza B ([facility]) NEGATIVE NEGATIVE Final  Urine culture     Status: None   Collection Time: 08/27/15  7:40 PM  Result Value Ref  Range Status   Specimen Description URINE, RANDOM  Final   Special Requests Normal  Final   Culture NO GROWTH 1 DAY  Final   Report Status 08/29/2015 FINAL  Final  MRSA PCR Screening     Status: None   Collection Time: 08/28/15  5:25 PM  Result Value Ref Range Status   MRSA by PCR NEGATIVE NEGATIVE Final    Comment:        The GeneXpert MRSA Assay (FDA approved for NASAL specimens only), is one component of a comprehensive MRSA colonization  surveillance program. It is not intended to diagnose MRSA infection nor to guide or monitor treatment for MRSA infections.   Culture, blood (Routine X 2) w Reflex to ID Panel     Status: None   Collection Time: 08/28/15  5:51 PM  Result Value Ref Range Status   Specimen Description BLOOD RIGHT HAND  Final   Special Requests BAA,ANA,AER,5ML  Final   Culture NO GROWTH 5 DAYS  Final   Report Status 09/02/2015 FINAL  Final  Culture, blood (Routine X 2) w Reflex to ID Panel     Status: None   Collection Time: 08/28/15  5:51 PM  Result Value Ref Range Status   Specimen Description BLOOD RIGHT ASSIST CONTROL  Final   Special Requests BAA,ANA,AER,5ML  Final   Culture NO GROWTH 5 DAYS  Final   Report Status 09/02/2015 FINAL  Final  Culture, respiratory (NON-Expectorated)     Status: None   Collection Time: 08/29/15  2:16 PM  Result Value Ref Range Status   Specimen Description TRACHEAL ASPIRATE  Final   Special Requests NONE  Final   Gram Stain FEW WBC SEEN FEW YEAST   Final   Culture Consistent with normal respiratory flora.  Final   Report Status 09/01/2015 FINAL  Final  Culture, respiratory (NON-Expectorated)     Status: None   Collection Time: 08/31/15  3:45 PM  Result Value Ref Range Status   Specimen Description TRACHEAL ASPIRATE  Final   Special Requests NONE  Final   Gram Stain   Final    FEW WBC SEEN MANY GRAM POSITIVE RODS MODERATE GRAM POSITIVE COCCI RARE YEAST    Culture HEAVY GROWTH ENTEROCOCCUS FAECALIS  Final   Report  Status 09/03/2015 FINAL  Final   Organism ID, Bacteria ENTEROCOCCUS FAECALIS  Final      Susceptibility   Enterococcus faecalis - MIC*    AMPICILLIN <=2 SENSITIVE Sensitive     VANCOMYCIN 1 SENSITIVE Sensitive     GENTAMICIN SYNERGY SENSITIVE Sensitive     LINEZOLID 2 SENSITIVE Sensitive     * HEAVY GROWTH ENTEROCOCCUS FAECALIS  Culture, blood (Routine X 2) w Reflex to ID Panel     Status: None   Collection Time: 09/02/15  3:18 PM  Result Value Ref Range Status   Specimen Description BLOOD LINE  Final   Special Requests   Final    BOTTLES DRAWN AEROBIC AND ANAEROBIC  AER 4CC ANA 1CC   Culture NO GROWTH 5 DAYS  Final   Report Status 09/07/2015 FINAL  Final  Culture, blood (Routine X 2) w Reflex to ID Panel     Status: None   Collection Time: 09/02/15  3:20 PM  Result Value Ref Range Status   Specimen Description BLOOD RIGHT ARM  Final   Special Requests BOTTLES DRAWN AEROBIC AND ANAEROBIC  8CC  Final   Culture NO GROWTH 5 DAYS  Final   Report Status 09/07/2015 FINAL  Final  Culture, respiratory (NON-Expectorated)     Status: None   Collection Time: 09/02/15  4:30 PM  Result Value Ref Range Status   Specimen Description TRACHEAL ASPIRATE  Final   Special Requests NONE  Final   Gram Stain   Final    MODERATE WBC SEEN FEW GRAM POSITIVE COCCI IN PAIRS    Culture LIGHT GROWTH STENOTROPHOMONAS MALTOPHILIA  Final   Report Status 09/06/2015 FINAL  Final   Organism ID, Bacteria STENOTROPHOMONAS MALTOPHILIA  Final      Susceptibility   Stenotrophomonas  maltophilia - MIC*    LEVOFLOXACIN <=0.12 SENSITIVE Sensitive     TRIMETH/SULFA <=20 SENSITIVE Sensitive     * LIGHT GROWTH STENOTROPHOMONAS MALTOPHILIA  C difficile quick scan w PCR reflex     Status: None   Collection Time: 09/02/15  4:56 PM  Result Value Ref Range Status   C Diff antigen NEGATIVE NEGATIVE Final   C Diff toxin NEGATIVE NEGATIVE Final   C Diff interpretation Negative for C. difficile  Final    Thank you for  allowing pharmacy to be a part of this patient's care.  Simpson,Michael L 09/12/2015 9:55 AM

## 2015-09-12 NOTE — Progress Notes (Signed)
End treatment assessment. 

## 2015-09-12 NOTE — Progress Notes (Signed)
[facility]  ROUNDING NOTE   Subjective:  Patient had hemodialysis yesterday. We have plan for hemodialysis again today as he remains volume overloaded. However over the past several days we have decreased way down to 104 kg. Urine output was 385 cc over the preceding 3 shifts.    Objective:  Vital signs in last 24 hours:  Temp:  [99.2 F (37.3 C)-101.4 F (38.6 C)] 99.9 F (37.7 C) (04/12 0400) Pulse Rate:  [89-110] 94 (04/12 0830) Resp:  [12-22] 15 (04/12 0830) BP: (113-150)/(57-80) 136/64 mmHg (04/12 0830) SpO2:  [93 %-100 %] 96 % (04/12 0830) Weight:  [104.1 kg (229 lb 8 oz)-106.3 kg (234 lb 5.6 oz)] 104.1 kg (229 lb 8 oz) (04/11 2030)  Weight change: -7.3 kg (-16 lb 1.5 oz) Filed Weights   09/10/15 1930 09/11/15 1600 09/11/15 2030  Weight: 110 kg (242 lb 8.1 oz) 106.3 kg (234 lb 5.6 oz) 104.1 kg (229 lb 8 oz)    Intake/Output: I/O last 3 completed shifts: In: 320 [P.O.:320] Out: 4060 [Urine:385; Other:3600; Stool:75]   Intake/Output this shift:  Total I/O In: -  Out: 20 [Urine:20]  Physical Exam: General: No acute distress  Head: [place]/AT EOMI hearing intact  Eyes: EOMI  Neck: trachea midline, left subclavian triple lumen  Lungs:  Bilateral rhonchi  Heart: S1S2 no rubs  Abdomen:  Soft, nontender  Extremities: ++edema  Neurologic: Awake, alert, following commands  Skin: Mottling right hand and b/l feet  Access: Left IJ dialysis cathter (4/2) Dr Sung AmabileSimonds    Basic Metabolic Panel:  Recent Labs Lab 09/06/15 0503  09/07/15 0515 09/08/15 0500 09/09/15 0520 09/10/15 0604 09/11/15 0446 09/12/15 0232  NA  --   < > 135 132* 138 138 140 140  K  --   < > 5.4* 5.0 4.8 4.7 3.8 3.7  CL  --   < > 104 98* 101 101 102 102  CO2  --   < > 27 26 29 29 31 30   GLUCOSE  --   < > 151* 150* 189* 128* 90 94  BUN  --   < > 80* 73* 78* 77* 56* 39*  CREATININE  --   < > 3.31* 3.42* 3.55* 3.68* 3.15* 2.88*  CALCIUM  --   < > 7.6* 7.1* 7.3* 7.5* 7.6* 7.5*  MG  2.6*  --  2.5* 2.7* 2.8* 2.8*  --  2.2  PHOS 3.4  --  4.2 6.2* 8.1* 7.2*  --   --   < > = values in this interval not displayed.  Liver Function Tests:  Recent Labs Lab 09/06/15 1124 09/07/15 0515 09/10/15 0604 09/11/15 0446  AST 104* 103* 45* 39  ALT 58 57 42 35  ALKPHOS 98 87 67 51  BILITOT 0.5 0.5 0.5 0.5  PROT 5.1* 5.0* 5.1* 4.5*  ALBUMIN 2.6* 2.4* 2.7* 2.4*   No results for input(s): LIPASE, AMYLASE in the last 168 hours. No results for input(s): AMMONIA in the last 168 hours.  CBC:  Recent Labs Lab 09/06/15 0503  09/08/15 0848 09/09/15 0520 09/10/15 0604 09/11/15 0446 09/12/15 0232  WBC 18.3*  < > 19.1* 13.7* 18.5* 11.7* 8.4  NEUTROABS 16.2*  --   --   --   --   --   --   HGB 7.7*  < > 7.0* 6.2* 7.5* 7.1* 7.0*  HCT 23.5*  < > 21.8* 19.2* 23.2* 22.0* 21.1*  MCV 92.9  < > 91.6 91.8 91.4 91.9 91.5  PLT  69*  < > 128* 128* 148* 128* 135*  < > = values in this interval not displayed.  Cardiac Enzymes:  Recent Labs Lab 09/10/15 0604 09/11/15 0446  CKTOTAL 801* 764*    BNP: Invalid input(s): POCBNP  CBG:  Recent Labs Lab 09/10/15 0732 09/11/15 0101 09/11/15 0451 09/11/15 1723 09/11/15 2031  GLUCAP 114* 70 83 86 85    Microbiology: Results for orders placed or performed during the hospital encounter of 08/27/15  Rapid Influenza A&B Antigens ([facility] only)     Status: None   Collection Time: 08/27/15 12:22 PM  Result Value Ref Range Status   Influenza A ([facility]) NEGATIVE NEGATIVE Final   Influenza B ([facility]) NEGATIVE NEGATIVE Final  Urine culture     Status: None   Collection Time: 08/27/15  7:40 PM  Result Value Ref Range Status   Specimen Description URINE, RANDOM  Final   Special Requests Normal  Final   Culture NO GROWTH 1 DAY  Final   Report Status 08/29/2015 FINAL  Final  MRSA PCR Screening     Status: None   Collection Time: 08/28/15  5:25 PM  Result Value Ref Range Status   MRSA by PCR NEGATIVE NEGATIVE Final    Comment:        The  GeneXpert MRSA Assay (FDA approved for NASAL specimens only), is one component of a comprehensive MRSA colonization surveillance program. It is not intended to diagnose MRSA infection nor to guide or monitor treatment for MRSA infections.   Culture, blood (Routine X 2) w Reflex to ID Panel     Status: None   Collection Time: 08/28/15  5:51 PM  Result Value Ref Range Status   Specimen Description BLOOD RIGHT HAND  Final   Special Requests BAA,ANA,AER,5ML  Final   Culture NO GROWTH 5 DAYS  Final   Report Status 09/02/2015 FINAL  Final  Culture, blood (Routine X 2) w Reflex to ID Panel     Status: None   Collection Time: 08/28/15  5:51 PM  Result Value Ref Range Status   Specimen Description BLOOD RIGHT ASSIST CONTROL  Final   Special Requests BAA,ANA,AER,5ML  Final   Culture NO GROWTH 5 DAYS  Final   Report Status 09/02/2015 FINAL  Final  Culture, respiratory (NON-Expectorated)     Status: None   Collection Time: 08/29/15  2:16 PM  Result Value Ref Range Status   Specimen Description TRACHEAL ASPIRATE  Final   Special Requests NONE  Final   Gram Stain FEW WBC SEEN FEW YEAST   Final   Culture Consistent with normal respiratory flora.  Final   Report Status 09/01/2015 FINAL  Final  Culture, respiratory (NON-Expectorated)     Status: None   Collection Time: 08/31/15  3:45 PM  Result Value Ref Range Status   Specimen Description TRACHEAL ASPIRATE  Final   Special Requests NONE  Final   Gram Stain   Final    FEW WBC SEEN MANY GRAM POSITIVE RODS MODERATE GRAM POSITIVE COCCI RARE YEAST    Culture HEAVY GROWTH ENTEROCOCCUS FAECALIS  Final   Report Status 09/03/2015 FINAL  Final   Organism ID, Bacteria ENTEROCOCCUS FAECALIS  Final      Susceptibility   Enterococcus faecalis - MIC*    AMPICILLIN <=2 SENSITIVE Sensitive     VANCOMYCIN 1 SENSITIVE Sensitive     GENTAMICIN SYNERGY SENSITIVE Sensitive     LINEZOLID 2 SENSITIVE Sensitive     * HEAVY GROWTH ENTEROCOCCUS FAECALIS  Culture, blood (Routine X 2) w Reflex to ID Panel     Status: None   Collection Time: 09/02/15  3:18 PM  Result Value Ref Range Status   Specimen Description BLOOD LINE  Final   Special Requests   Final    BOTTLES DRAWN AEROBIC AND ANAEROBIC  AER 4CC ANA 1CC   Culture NO GROWTH 5 DAYS  Final   Report Status 09/07/2015 FINAL  Final  Culture, blood (Routine X 2) w Reflex to ID Panel     Status: None   Collection Time: 09/02/15  3:20 PM  Result Value Ref Range Status   Specimen Description BLOOD RIGHT ARM  Final   Special Requests BOTTLES DRAWN AEROBIC AND ANAEROBIC  8CC  Final   Culture NO GROWTH 5 DAYS  Final   Report Status 09/07/2015 FINAL  Final  Culture, respiratory (NON-Expectorated)     Status: None   Collection Time: 09/02/15  4:30 PM  Result Value Ref Range Status   Specimen Description TRACHEAL ASPIRATE  Final   Special Requests NONE  Final   Gram Stain   Final    MODERATE WBC SEEN FEW GRAM POSITIVE COCCI IN PAIRS    Culture LIGHT GROWTH STENOTROPHOMONAS MALTOPHILIA  Final   Report Status 09/06/2015 FINAL  Final   Organism ID, Bacteria STENOTROPHOMONAS MALTOPHILIA  Final      Susceptibility   Stenotrophomonas maltophilia - MIC*    LEVOFLOXACIN <=0.12 SENSITIVE Sensitive     TRIMETH/SULFA <=20 SENSITIVE Sensitive     * LIGHT GROWTH STENOTROPHOMONAS MALTOPHILIA  C difficile quick scan w PCR reflex     Status: None   Collection Time: 09/02/15  4:56 PM  Result Value Ref Range Status   C Diff antigen NEGATIVE NEGATIVE Final   C Diff toxin NEGATIVE NEGATIVE Final   C Diff interpretation Negative for C. difficile  Final    Coagulation Studies: No results for input(s): LABPROT, INR in the last 72 hours.  Urinalysis: No results for input(s): COLORURINE, LABSPEC, PHURINE, GLUCOSEU, HGBUR, BILIRUBINUR, KETONESUR, PROTEINUR, UROBILINOGEN, NITRITE, LEUKOCYTESUR in the last 72 hours.  Invalid input(s): APPERANCEUR    Imaging: Dg Chest Port 1 View  09/12/2015  CLINICAL  DATA:  Respiratory failure, acute renal failure, hypovolemic shock EXAM: PORTABLE CHEST 1 VIEW COMPARISON:  Chest x-ray of September 10, 2015 FINDINGS: There is been interval extubation of the trachea and of the esophagus. The lungs are adequately inflated. The pulmonary interstitial markings remain increased. Retrocardiac density on the left has decreased. The cardiac silhouette is mildly enlarged. The central pulmonary vascularity is prominent. The large caliber left internal jugular venous catheter tip projects over the proximal third of the SVC. The subclavian venous catheter on the left has its tip projecting at the junction of the right and left brachiocephalic veins. IMPRESSION: Improving left lower lobe atelectasis or pneumonia. Persistent mild pulmonary vascular congestion. There is no pleural effusion or pneumothorax. The remaining support tubes are in reasonable position. Electronically Signed   By: David  [place] M.D.   On: 09/12/2015 07:16     Medications:     . antiseptic oral rinse  7 mL Mouth Rinse BID  . heparin subcutaneous  5,000 Units Subcutaneous 3 times per day   sodium chloride, sodium chloride, acetaminophen **OR** [DISCONTINUED] acetaminophen, albuterol, alteplase, bisacodyl, heparin, heparin, hydrALAZINE, HYDROmorphone (DILAUDID) injection, LORazepam, sodium chloride, sodium chloride flush  Assessment/ Plan:  Mr. RONAL MAYBURY is a 50 y.o. white male with no specific past medical history, who was  admitted to [facility] on 08/27/2015 + Ibuprofen use prior to admission (600 mg TID x 2 days)  1. Acute Renal Failure, oliguric. Likely secondary to ATN 2. Metabolic Acidosis 3. Rhabdomyolysis with hyperphosphatemia 4. Acute respiratory failure, E faecalis (3/31), extubated 09/10/15. 5. Influenza A positive 6. Hypoalbuminemia 7. Anasarca/Generalized edema 8. Anemia and Thrombocytopenia (hematology evaluation ongoing) 9. Hyperkalemia.  Plan: Renal function not significantly improved  as UOP was 385cc over the preceding 3 shifts.  The patient has generalized anasarca which has improved over the past several days. However overall he is still volume overloaded. Therefore we will plan for a trial of hemodialysis again today with ultrafiltration target of 2 kg. He may be moving to a long-term acute care facility in the relative near future. We are hopeful over this time that his renal function will slowly begin to improve. However he will require significant rehabilitation.   LOS: 16 Haven Pylant 4/12/20179:25 AM

## 2015-09-12 NOTE — Care Management Note (Addendum)
Patient is scheduled to discharge tomorrow to [facility].  No placement for dialysis needed at discharge.  Ivor ReiningKim Osborn Pullin Dialysis Coordinator  [phone]

## 2015-09-12 NOTE — Progress Notes (Signed)
Pt had 7 beat run of SVT. Pt asymptomatic. No complaints of chest pain. Vinnie LangtonGretchen, RN at [facility] notified.

## 2015-09-12 NOTE — Progress Notes (Signed)
[facility] notified about pts Hgb 7.0. No new orders given at this time.

## 2015-09-12 NOTE — Progress Notes (Signed)
Post dialysis assessment 

## 2015-09-12 NOTE — Progress Notes (Signed)
[facility] Physician-Brief Progress Note Patient Name: John Hoover DOB: 03-30-1966 MRN: [id number]   Date of Service  09/12/2015  HPI/Events of Note  Run of SVT. Now back in NSR with rate = 95.   eICU Interventions  Will order: 1. Send AM BMP now. 2. Magnesium level now.      Intervention Category Major Interventions: Arrhythmia - evaluation and management  Peytan Andringa Eugene 09/12/2015, 1:14 AM

## 2015-09-12 NOTE — Progress Notes (Signed)
PULMONARY / CRITICAL CARE MEDICINE   Name: Peterson AoWilliam E Grego MRN: [id number] DOB: April 19, 1966    ADMISSION DATE:  08/27/2015 CONSULTATION DATE:  08/28/15   PT PROFILE: [name] previously healthy [age] M admitted to gen med floor with several days of malaise and profound myalgias. Influenza PCR positive. On admission was noted to be hemoconcentrated with AKI and elevated CPK. On 03/28 developed progressive AMS and found to have profound metabolic acidosis. Transferred to ICU and PCCM consulted for management of acidosis, rhabdomyolysis, MODS, shock. Intubated, CVL placed and HD cath placed on arrival to ICU. Empiric abx initiated  SUBJECTIVE: Agitation and complaints of pain overnight. Given two extra doses of hydromorphone with moderate relief. Tolerated >16h of PSV during the day; placed back on PRVC overnight. Elink contacted and recommended starting diprivan gtt; held due to moderate improvement in symptoms.  HD yesterday with removal of 2L of fluids. Transfused 1 unit of PRBCs for H/H 6.2/19.2. Nods in approval to pain.   MAJOR EVENTS/TEST RESULTS: 03/27 Admitted as above 03/28 transferred to ICU, intubated, CVL placed, HD cath placed, vasopressors initiated, Renal consult, CRRT initiated, HCO3 gtt initiated, empiric abx initated 03/28 CT head: NAD 03/28 Echocardiogram: LVEF 60-65%, grade one diastolic dysfunction 03/28 CTAP: no acute findings 03/30 BLE venous US: no DVT 03/31 Acidosis much improved. CRRT stopped, HCO3 gtt stopped 03/31 Transition off midaz infusion to dexmedetomidine 04/01 CRRT resumed. Back on low dose fentanyl infusion. Failed SBT. Trial furosemide with minimal response 04/02 Increasing agitation. Dexmedetomidine transitioned to propofol. R femoral HD cath not functioning and replaced after platelet transfusion. CXR pattern c/w edema, severe LE and scrotal edema - volume removal via CRRT 04/02 Worsening gas exchange requiring increased vent support and heavier sedation. HD  cath required replacement. Platelets transfused prior to placement. Wife apprised several times over course of day 040/3 remains on CRRT very low UO 04/05 patient failed SAT/SBT due to resp muscle fatigue 04/05 One unit PRBCs transfused for Hgb 6.8 04/06 failed SAT/SBT again, Stenotrophomonas species  04/07 off pressors.  04/08 failed SAT/SBT again, Stenotrophomonas species 04/09 tolerated PSV all day 04/09 One unit PRBCs transfused for Hgb 6.2 04/10 Extubated. Transitioned off hydromorphone gtt to Duragesic patch. Transitioned off midaz gtt to PRN  Lorazepam 04/11 Lethargic. Tolerating extubation. Following commands. Poorly oriented. SLP eval. PT eval 04/12 Improving cognition. Hgb 7.0 - no transfusion planned as there is no overt bleeding   INDWELLING DEVICES:: ETT 03/28 >> 04/10 L [place] CVL 03/28 >>  R femoral HD cath 03/28 >> 04/02 R femoral A-line 03/28 >> out R IJ HD cath 04/02 >>   MICRO DATA: Flu PCR 03/27 >> positive for A, neg for H1N1 MRSA PCR 03/28 >> NEG Urine 03/27 >> NEG Blood 03/28 >> NEG Resp 03/29 >> NOF HIV 03/31 >> NEG Resp 03/31 >>enterococcus faecalis C diff 04/02 >> NEG Resp 04/02 >> stenotrophomonas    ANTIMICROBIALS:  Oseltamivir 03/27 >> 04/01 Ancef 04/04>04/05 Ampicillin 04/04>>04/05 Vanc 03/28 >> 03/31 Cefepime 03/28 >>04/03  Zosyn 04/07 >> 04/10 Bactrim 04/06 >> 04/15 (planned)  SUBJ: RASS 0, + F/C. Oriented to person and place  VITAL SIGNS: BP 138/61 mmHg  Pulse 90  Temp(Src) 99.9 F (37.7 C) (Oral)  Resp 18  Ht 6\' 1"  (1.854 m)  Wt 104.1 kg (229 lb 8 oz)  BMI 30.29 kg/m2  SpO2 96%  HEMODYNAMICS:    VENTILATOR SETTINGS:    INTAKE / OUTPUT: I/O last 3 completed shifts: In: 320 [P.O.:320] Out: 4060 [Urine:385; Other:3600; Stool:75]  PHYSICAL EXAMINATION: General: RASS 0, + F/C Neuro: No focal deficits, diffusely weak HEENT: WNL Cardiovascular: Reg, no M Lungs: clear anteriorly Abdomen: soft, NT, ND, + BS Ext: warm,  improving BLE edema, several digits with small areas of necrosis on tips, multiple bullae on BUE and BLE GU: improving scrotal and penile edema  LABS:  BMET  Recent Labs Lab 09/10/15 0604 09/11/15 0446 09/12/15 0232  NA 138 140 140  K 4.7 3.8 3.7  CL 101 102 102  CO2 BUN 77* 56* 39*  CREATININE 3.68* 3.15* 2.88*  GLUCOSE 128* 90 94    Electrolytes  Recent Labs Lab 09/08/15 0500 09/09/15 0520 09/10/15 0604 09/11/15 0446 09/12/15 0232  CALCIUM 7.1* 7.3* 7.5* 7.6* 7.5*  MG 2.7* 2.8* 2.8*  --  2.2  PHOS 6.2* 8.1* 7.2*  --   --     CBC  Recent Labs Lab 09/10/15 0604 09/11/15 0446 09/12/15 0232  WBC 18.5* 11.7* 8.4  HGB 7.5* 7.1* 7.0*  HCT 23.2* 22.0* 21.1*  PLT 148* 128* 135*    Coag's No results for input(s): APTT, INR in the last 168 hours.  Sepsis Markers No results for input(s): LATICACIDVEN, PROCALCITON, O2SATVEN in the last 168 hours.  ABG  Recent Labs Lab 09/06/15 1100 09/07/15 1100 09/08/15 1839  PHART 7.37 7.44 7.48*  PCO2ART 45 39 47  PO2ART 71* 64* 74*    Liver Enzymes  Recent Labs Lab 09/07/15 0515 09/10/15 0604 09/11/15 0446  AST 103* 45* 39  ALT 57 42 35  ALKPHOS 87 67 51  BILITOT 0.5 0.5 0.5  ALBUMIN 2.4* 2.7* 2.4*    Cardiac Enzymes No results for input(s): TROPONINI, PROBNP in the last 168 hours.  Glucose  Recent Labs Lab 09/10/15 0019 09/10/15 0732 09/11/15 0101 09/11/15 0451 09/11/15 1723 09/11/15 2031  GLUCAP 102* 114* 70 83 86 85    CXR: improving LLL AS dz   ASSESSMENT / PLAN:  PULMONARY A: Acute hypoxic resp failure, resolved Pulmonary edema pattern - improving Stenotrophomonas VAP P:   Cont SDU status Cont supplemental O2 as needed to maintain SpO2 > 92%   CARDIOVASCULAR A:  Shock, resolved Sinus tachycardia, resolved Hypertension, controlled P:  Monitor PRN metoprolol PRN hydralazine  RENAL A:   AKI, oliguric - some response to furosemide 04/11 Rhabdomyolysis - CK  remains elevated, likely due to poor renal clearance Severe lactic acidosis, resolved Severe hypervolemia, improving P:   Monitor BMET intermittently Monitor I/Os Correct electrolytes as indicated Nephrology following Daily HD unitl volume status improves  GASTROINTESTINAL A:   Heme + stools Mildly elevated LFTs Post extubation dysphagia P:   SUP: N/I post extubation Dysphagia I diet SLP following  HEMATOLOGIC A:   Polycythemia, resolved ICU acquired anemia  S/P PRBCs 04/05, 04/09 Severe thrombocytopenia, improving P:  DVT px: SQ heparin (initiated 04/10) Monitor CBC intermittently Transfuse per usual guidelines Monitor H/H and plt count closely on SQ heparin  INFECTIOUS A:   Severe sepsis - resolved Stenotrophomonas PNA P:   Monitor temp, WBC count Micro and abx as above Complete 10 days TMP-SMX  ENDOCRINE A:   Mild stress induced hyperglycemia, resolved P:   Cont CBG q 8 hrs with no SSI  NEUROLOGIC A:   Acute ICU acquired encephalopathy - improving Pain, controlled Agitation, resolved P:   RASS goal: 0 Cont low dose PRN hydromorphone and lorazepam  DISPO: Seeking transfer to [facility] in [place]   Wife updated @ bedside   Billy Fischer, MD  PCCM service Mobile [phone] Pager [phone]

## 2015-09-12 NOTE — Progress Notes (Signed)
This note also relates to the following rows which could not be included: Pulse Rate - Cannot attach notes to unvalidated device data Resp - Cannot attach notes to unvalidated device data BP - Cannot attach notes to unvalidated device data SpO2 - Cannot attach notes to unvalidated device data   Post-dialysis assessment 

## 2015-09-12 NOTE — Progress Notes (Signed)
OT Cancellation Note  Patient Details Name: Peterson AoWilliam E Kargbo MRN: [id number] DOB: 1966/02/12   Cancelled Treatment:    Reason Eval/Treat Not Completed: Medical issues which prohibited therapy (Hemoglobin is 7.0 which is contraindicated for OT Intervention at thie time. Will continue to monitor.)  Olegario MessierElaine Argenis Kumari, MS, OTR/L  Olegario Messierlaine Herbert Aguinaldo 09/12/2015, 8:56 AM

## 2015-09-12 NOTE — Progress Notes (Signed)
Rehab Admissions Coordinator Note:  Patient was screened by Trish MageLogue, Kordel Leavy M for appropriateness for an Inpatient Acute Rehab Consult.  At this time, PT/OT evaluations and recommendations are pending.  I will follow progress and potential for acute inpatient rehab admission.  Call me for questions.    Trish MageLogue, Braxtyn Bojarski M 09/12/2015, 10:34 AM  I can be reached at [phone].

## 2015-09-12 NOTE — Progress Notes (Signed)
PT Cancellation Note  Patient Details Name: John Hoover MRN: [id number] DOB: February 13, 1966   Cancelled Treatment:    Reason Eval/Treat Not Completed: Medical issues which prohibited therapy.  Pt is exhibiting hemoglobin of 7.0 (09-12-15).  Per PT guidelines, will hold PT at this time.  Will re-attempt PT at a later date and time when medically appropriate.    Lyndel SafeGarrett Zlaty Alexa, SPT Lyndel SafeGarrett Noreen Mackintosh 09/12/2015, 8:29 AM

## 2015-09-13 ENCOUNTER — Inpatient Hospital Stay
Admission: AD | Admit: 2015-09-13 | Discharge: 2015-09-24 | Disposition: A | Discharge status: Rehabilitation Facility | Payer: Self-pay | Source: Ambulatory Visit | Attending: Internal Medicine | Admitting: Internal Medicine

## 2015-09-13 ENCOUNTER — Encounter
Admission: EM | Disposition: A | Discharge status: Nursing Home (Includes ICF) | Payer: Self-pay | Source: Home / Self Care | Attending: Pulmonary Disease

## 2015-09-13 ENCOUNTER — Ambulatory Visit (HOSPITAL_COMMUNITY)
Admission: AD | Admit: 2015-09-13 | Discharge: 2015-09-13 | Disposition: A | Discharge status: Home / Self Care | Payer: BLUE CROSS/BLUE SHIELD | Source: Other Acute Inpatient Hospital | Attending: Internal Medicine | Admitting: Internal Medicine

## 2015-09-13 DIAGNOSIS — J9690 Respiratory failure, unspecified, unspecified whether with hypoxia or hypercapnia: Secondary | ICD-10-CM

## 2015-09-13 DIAGNOSIS — Z452 Encounter for adjustment and management of vascular access device: Secondary | ICD-10-CM

## 2015-09-13 DIAGNOSIS — N189 Chronic kidney disease, unspecified: Secondary | ICD-10-CM

## 2015-09-13 DIAGNOSIS — R0600 Dyspnea, unspecified: Secondary | ICD-10-CM | POA: Insufficient documentation

## 2015-09-13 DIAGNOSIS — R509 Fever, unspecified: Secondary | ICD-10-CM

## 2015-09-13 DIAGNOSIS — N179 Acute kidney failure, unspecified: Secondary | ICD-10-CM

## 2015-09-13 LAB — BASIC METABOLIC PANEL
Anion gap: 6 (ref 5–15)
BUN: 35 mg/dL — AB (ref 6–20)
CALCIUM: 7.4 mg/dL — AB (ref 8.9–10.3)
CO2: 31 mmol/L (ref 22–32)
Chloride: 100 mmol/L — ABNORMAL LOW (ref 101–111)
Creatinine, Ser: 3.05 mg/dL — ABNORMAL HIGH (ref 0.61–1.24)
GFR calc Af Amer: 26 mL/min — ABNORMAL LOW (ref >60)
GFR, EST NON AFRICAN AMERICAN: 22 mL/min — AB (ref >60)
GLUCOSE: 95 mg/dL (ref 65–99)
Potassium: 3.7 mmol/L (ref 3.5–5.1)
Sodium: 137 mmol/L (ref 135–145)

## 2015-09-13 SURGERY — DIALYSIS/PERMA CATHETER INSERTION
Anesthesia: Moderate Sedation

## 2015-09-13 SURGERY — DIALYSIS/PERMA CATHETER INSERTION
Anesthesia: Moderate Sedation | Laterality: Right

## 2015-09-13 MED ORDER — ALBUMIN HUMAN 25 % IV SOLN
12.5000 g | Freq: Once | INTRAVENOUS | Status: DC
  Filled 2015-09-13: qty 50

## 2015-09-13 MED ORDER — FUROSEMIDE 10 MG/ML IJ SOLN
120.0000 mg | Freq: Once | INTRAVENOUS | Status: AC
  Administered 2015-09-13: 120 mg via INTRAVENOUS
  Filled 2015-09-13: qty 20
  Filled 2015-09-13: qty 12

## 2015-09-13 MED ORDER — ONDANSETRON HCL 4 MG/2ML IJ SOLN
INTRAMUSCULAR | Status: AC
  Filled 2015-09-13: qty 2

## 2015-09-13 NOTE — Progress Notes (Signed)
POST HD TX 

## 2015-09-13 NOTE — Progress Notes (Signed)
Hemodialysis completed. 

## 2015-09-13 NOTE — Progress Notes (Signed)
[facility]  ROUNDING NOTE   Subjective:  Patient had hemodialysis yesterday. 2300 cc removed Breathing fine today Trying to take adequate PO Family at bedside UOP remains low  Objective:  Vital signs in last 24 hours:  Temp:  [98.2 F (36.8 C)-102.2 F (39 C)] 98.9 F (37.2 C) (04/13 0800) Pulse Rate:  [71-106] 90 (04/13 0835) Resp:  [11-20] 13 (04/13 0835) BP: (108-138)/(59-108) 117/81 mmHg (04/13 0800) SpO2:  [90 %-100 %] 91 % (04/13 0835) Weight:  [100.5 kg (221 lb 9 oz)-102.6 kg (226 lb 3.1 oz)] 100.5 kg (221 lb 9 oz) (04/12 2015)  Weight change: -3.7 kg (-8 lb 2.5 oz) Filed Weights   09/11/15 2030 09/12/15 1600 09/12/15 2015  Weight: 104.1 kg (229 lb 8 oz) 102.6 kg (226 lb 3.1 oz) 100.5 kg (221 lb 9 oz)    Intake/Output: I/O last 3 completed shifts: In: 1160 [P.O.:1160] Out: 4240 [Urine:140; Other:4100]   Intake/Output this shift:     Physical Exam: General: No acute distress  Head: [place]/AT EOMI hearing intact  Eyes: EOMI  Neck: trachea midline, l   Lungs:  Clear ant/lat, decreased breath sounds   Heart: S1S2 no rubs  Abdomen:  Soft, nontender  Extremities: ++edema  Neurologic: Awake, alert, following commands  Skin: Mottling right hand and b/l feet  Access: Left IJ dialysis cathter (4/2) Dr Sung AmabileSimonds    Basic Metabolic Panel:  Recent Labs Lab 09/07/15 0515 09/08/15 0500 09/09/15 0520 09/10/15 0604 09/11/15 0446 09/12/15 0232 09/13/15 0430  NA 135 132* 138 138 140 140 137  K 5.4* 5.0 4.8 4.7 3.8 3.7 3.7  CL 104 98* 101 101 102 102 100*  CO2 27 26 29 29 31 30 31   GLUCOSE 151* 150* 189* 128* 90 94 95  BUN 80* 73* 78* 77* 56* 39* 35*  CREATININE 3.31* 3.42* 3.55* 3.68* 3.15* 2.88* 3.05*  CALCIUM 7.6* 7.1* 7.3* 7.5* 7.6* 7.5* 7.4*  MG 2.5* 2.7* 2.8* 2.8*  --  2.2  --   PHOS 4.2 6.2* 8.1* 7.2*  --   --   --     Liver Function Tests:  Recent Labs Lab 09/06/15 1124 09/07/15 0515 09/10/15 0604 09/11/15 0446  AST 104* 103* 45*  39  ALT 58 57 42 35  ALKPHOS 98 87 67 51  BILITOT 0.5 0.5 0.5 0.5  PROT 5.1* 5.0* 5.1* 4.5*  ALBUMIN 2.6* 2.4* 2.7* 2.4*   No results for input(s): LIPASE, AMYLASE in the last 168 hours. No results for input(s): AMMONIA in the last 168 hours.  CBC:  Recent Labs Lab 09/08/15 0848 09/09/15 0520 09/10/15 0604 09/11/15 0446 09/12/15 0232  WBC 19.1* 13.7* 18.5* 11.7* 8.4  HGB 7.0* 6.2* 7.5* 7.1* 7.0*  HCT 21.8* 19.2* 23.2* 22.0* 21.1*  MCV 91.6 91.8 91.4 91.9 91.5  PLT 128* 128* 148* 128* 135*    Cardiac Enzymes:  Recent Labs Lab 09/10/15 0604 09/11/15 0446  CKTOTAL 801* 764*    BNP: Invalid input(s): POCBNP  CBG:  Recent Labs Lab 09/10/15 0732 09/11/15 0101 09/11/15 0451 09/11/15 1723 09/11/15 2031  GLUCAP 114* 70 83 86 85    Microbiology: Results for orders placed or performed during the hospital encounter of 08/27/15  Rapid Influenza A&B Antigens ([facility] only)     Status: None   Collection Time: 08/27/15 12:22 PM  Result Value Ref Range Status   Influenza A ([facility]) NEGATIVE NEGATIVE Final   Influenza B ([facility]) NEGATIVE NEGATIVE Final  Urine culture  Status: None   Collection Time: 08/27/15  7:40 PM  Result Value Ref Range Status   Specimen Description URINE, RANDOM  Final   Special Requests Normal  Final   Culture NO GROWTH 1 DAY  Final   Report Status 08/29/2015 FINAL  Final  MRSA PCR Screening     Status: None   Collection Time: 08/28/15  5:25 PM  Result Value Ref Range Status   MRSA by PCR NEGATIVE NEGATIVE Final    Comment:        The GeneXpert MRSA Assay (FDA approved for NASAL specimens only), is one component of a comprehensive MRSA colonization surveillance program. It is not intended to diagnose MRSA infection nor to guide or monitor treatment for MRSA infections.   Culture, blood (Routine X 2) w Reflex to ID Panel     Status: None   Collection Time: 08/28/15  5:51 PM  Result Value Ref Range Status   Specimen Description BLOOD  RIGHT HAND  Final   Special Requests BAA,ANA,AER,5ML  Final   Culture NO GROWTH 5 DAYS  Final   Report Status 09/02/2015 FINAL  Final  Culture, blood (Routine X 2) w Reflex to ID Panel     Status: None   Collection Time: 08/28/15  5:51 PM  Result Value Ref Range Status   Specimen Description BLOOD RIGHT ASSIST CONTROL  Final   Special Requests BAA,ANA,AER,5ML  Final   Culture NO GROWTH 5 DAYS  Final   Report Status 09/02/2015 FINAL  Final  Culture, respiratory (NON-Expectorated)     Status: None   Collection Time: 08/29/15  2:16 PM  Result Value Ref Range Status   Specimen Description TRACHEAL ASPIRATE  Final   Special Requests NONE  Final   Gram Stain FEW WBC SEEN FEW YEAST   Final   Culture Consistent with normal respiratory flora.  Final   Report Status 09/01/2015 FINAL  Final  Culture, respiratory (NON-Expectorated)     Status: None   Collection Time: 08/31/15  3:45 PM  Result Value Ref Range Status   Specimen Description TRACHEAL ASPIRATE  Final   Special Requests NONE  Final   Gram Stain   Final    FEW WBC SEEN MANY GRAM POSITIVE RODS MODERATE GRAM POSITIVE COCCI RARE YEAST    Culture HEAVY GROWTH ENTEROCOCCUS FAECALIS  Final   Report Status 09/03/2015 FINAL  Final   Organism ID, Bacteria ENTEROCOCCUS FAECALIS  Final      Susceptibility   Enterococcus faecalis - MIC*    AMPICILLIN <=2 SENSITIVE Sensitive     VANCOMYCIN 1 SENSITIVE Sensitive     GENTAMICIN SYNERGY SENSITIVE Sensitive     LINEZOLID 2 SENSITIVE Sensitive     * HEAVY GROWTH ENTEROCOCCUS FAECALIS  Culture, blood (Routine X 2) w Reflex to ID Panel     Status: None   Collection Time: 09/02/15  3:18 PM  Result Value Ref Range Status   Specimen Description BLOOD LINE  Final   Special Requests   Final    BOTTLES DRAWN AEROBIC AND ANAEROBIC  AER 4CC ANA 1CC   Culture NO GROWTH 5 DAYS  Final   Report Status 09/07/2015 FINAL  Final  Culture, blood (Routine X 2) w Reflex to ID Panel     Status: None    Collection Time: 09/02/15  3:20 PM  Result Value Ref Range Status   Specimen Description BLOOD RIGHT ARM  Final   Special Requests BOTTLES DRAWN AEROBIC AND ANAEROBIC  8CC  Final  Culture NO GROWTH 5 DAYS  Final   Report Status 09/07/2015 FINAL  Final  Culture, respiratory (NON-Expectorated)     Status: None   Collection Time: 09/02/15  4:30 PM  Result Value Ref Range Status   Specimen Description TRACHEAL ASPIRATE  Final   Special Requests NONE  Final   Gram Stain   Final    MODERATE WBC SEEN FEW GRAM POSITIVE COCCI IN PAIRS    Culture LIGHT GROWTH STENOTROPHOMONAS MALTOPHILIA  Final   Report Status 09/06/2015 FINAL  Final   Organism ID, Bacteria STENOTROPHOMONAS MALTOPHILIA  Final      Susceptibility   Stenotrophomonas maltophilia - MIC*    LEVOFLOXACIN <=0.12 SENSITIVE Sensitive     TRIMETH/SULFA <=20 SENSITIVE Sensitive     * LIGHT GROWTH STENOTROPHOMONAS MALTOPHILIA  C difficile quick scan w PCR reflex     Status: None   Collection Time: 09/02/15  4:56 PM  Result Value Ref Range Status   C Diff antigen NEGATIVE NEGATIVE Final   C Diff toxin NEGATIVE NEGATIVE Final   C Diff interpretation Negative for C. difficile  Final    Coagulation Studies: No results for input(s): LABPROT, INR in the last 72 hours.  Urinalysis: No results for input(s): COLORURINE, LABSPEC, PHURINE, GLUCOSEU, HGBUR, BILIRUBINUR, KETONESUR, PROTEINUR, UROBILINOGEN, NITRITE, LEUKOCYTESUR in the last 72 hours.  Invalid input(s): APPERANCEUR    Imaging: Dg Chest Port 1 View  09/12/2015  CLINICAL DATA:  Respiratory failure, acute renal failure, hypovolemic shock EXAM: PORTABLE CHEST 1 VIEW COMPARISON:  Chest x-ray of September 10, 2015 FINDINGS: There is been interval extubation of the trachea and of the esophagus. The lungs are adequately inflated. The pulmonary interstitial markings remain increased. Retrocardiac density on the left has decreased. The cardiac silhouette is mildly enlarged. The central  pulmonary vascularity is prominent. The large caliber left internal jugular venous catheter tip projects over the proximal third of the SVC. The subclavian venous catheter on the left has its tip projecting at the junction of the right and left brachiocephalic veins. IMPRESSION: Improving left lower lobe atelectasis or pneumonia. Persistent mild pulmonary vascular congestion. There is no pleural effusion or pneumothorax. The remaining support tubes are in reasonable position. Electronically Signed   By: David  [place] M.D.   On: 09/12/2015 07:16     Medications:     . antiseptic oral rinse  7 mL Mouth Rinse BID  . furosemide  120 mg Intravenous Once  . heparin subcutaneous  5,000 Units Subcutaneous 3 times per day   sodium chloride, sodium chloride, acetaminophen **OR** [DISCONTINUED] acetaminophen, albuterol, alteplase, bisacodyl, hydrALAZINE, HYDROmorphone (DILAUDID) injection, LORazepam, sodium chloride, sodium chloride flush  Assessment/ Plan:  John Hoover is a 50 y.o. white male with no specific past medical history, who was admitted to [facility] on 08/27/2015 + Ibuprofen use prior to admission (600 mg TID x 2 days)  1. Acute Renal Failure, oliguric. Likely secondary to ATN 2. Metabolic Acidosis 3. Rhabdomyolysis with hyperphosphatemia 4. Acute respiratory failure, Stenotrophomonas PNA, extubated 09/10/15. 5. Influenza A positive 6. Hypoalbuminemia 7. Anasarca/Generalized edema 8. Anemia and Thrombocytopenia (hematology evaluation ongoing) 9. Hyperkalemia.  Plan: Renal function not significantly improved as UOP remains low.  The patient has generalized anasarca which has improved over the past several days. However overall he is still volume overloaded. Therefore we will plan for continuation of hemodialysis again today with ultrafiltration target of 2-2.5 kg. He may be moving to a long-term acute care facility in the relative near future. We  are hopeful over this time that his  renal function will slowly begin to improve. However he will require significant rehabilitation. Permcath placement today PICC today   LOS: 17 John Hoover 4/13/20179:00 AM

## 2015-09-13 NOTE — Progress Notes (Signed)
Pre-hd tx 

## 2015-09-13 NOTE — Progress Notes (Signed)
Post hd tx 

## 2015-09-13 NOTE — Care Management (Signed)
Patient for transfer to [facility] today.  Wife is in agreement.

## 2015-09-13 NOTE — Progress Notes (Signed)
Hemodialysis start 

## 2015-09-13 NOTE — Discharge Summary (Signed)
PULMONARY / CRITICAL CARE MEDICINE   Name: John Hoover MRN: [id number] DOB: 01/04/1966    ADMISSION DATE:  08/27/2015 PCCM CONSULTATION DATE:  08/28/15   PT PROFILE: John Hoover previously healthy 39 M admitted to general medical floor with several days of malaise and profound myalgias. Influenza PCR was positive on admission. He was noted to be hemoconcentrated with AKI and elevated CPK. On 03/28, he developed progressive AMS and was found to have profound metabolic acidosis. He was transferred to the ICU and PCCM was consulted for management of acidosis, rhabdomyolysis, MODS, shock. He was intubated, CVL was placed and HD cath was placed for CRRT. Empiric abx were initiated   Hospital course: 03/27 Admitted as above 03/28 transferred to ICU, intubated, CVL placed, HD cath placed, vasopressors initiated, Renal consult, CRRT initiated, HCO3 gtt initiated, empiric abx initated 03/28 CT head: NAD 03/28 Echocardiogram: LVEF 60-65%, grade one diastolic dysfunction 03/28 CTAP: no acute findings 03/30 BLE venous US: no DVT 03/31 Acidosis much improved. CRRT stopped, HCO3 gtt stopped 03/31 Transitioned off midaz infusion to dexmedetomidine 04/01 CRRT resumed. Back on low dose fentanyl infusion. Failed SBT. Trial furosemide with minimal response 04/02 Increasing agitation. Dexmedetomidine transitioned to propofol. R femoral HD cath not functioning and replaced after platelet transfusion. CXR pattern c/w edema, severe LE and scrotal edema - volume removal via CRRT 04/02 Worsening gas exchange requiring increased vent support and heavier sedation. HD cath required replacement. Platelets transfused prior to placement 040/3 remained on CRRT very low UO 04/05 patient failed SBT due to resp muscle fatigue 04/05 One unit PRBCs transfused for Hgb 6.8 04/06 failed SBT again, Stenotrophomonas maltophilia cultured from respiratory tract. TMP-SMX initiated 04/07 off vasopressors.  04/09 tolerated PSV all  day 04/09 One unit PRBCs transfused for Hgb 6.2 04/10 Extubated. Transitioned off hydromorphone gtt to Duragesic patch. Transitioned off midaz gtt to PRN  Lorazepam 04/11 Lethargic. Tolerating extubation. Following commands. Poorly oriented. SLP eval. PT eval 04/12 Improving cognition. Hgb 7.0 - no transfusion planned as there is no overt bleeding  04/13 Much improved. Profoundly deconditioned. Transfer to [facility] in [facility]  INDWELLING DEVICES:: ETT 03/28 >> 04/10 R femoral HD cath 03/28 >> 04/02 R femoral A-line 03/28 >> out L [place] CVL 03/28 >>  R IJ HD cath 04/02 >>   MICRO DATA: Flu PCR 03/27 >> positive for A, neg for H1N1 MRSA PCR 03/28 >> NEG Urine 03/27 >> NEG Blood 03/28 >> NEG Resp 03/29 >> NOF HIV 03/31 >> NEG Resp 03/31 >>enterococcus faecalis C diff 04/02 >> NEG Resp 04/02 >> stenotrophomonas (reported 04/06)   ANTIMICROBIALS:  Oseltamivir 03/27 >> 04/01 Ancef 04/04>04/05 Ampicillin 04/04>>04/05 Vanc 03/28 >> 03/31 Cefepime 03/28 >>04/03  Zosyn 04/07 >> 04/10 Bactrim 04/06 >> 04/13  SUBJ: RASS 0, + F/C. Very weak  VITAL SIGNS: BP 126/81 mmHg  Pulse 84  Temp(Src) 98.9 F (37.2 C) (Oral)  Resp 17  Ht  (1.854 m)  Wt 100.5 kg (221 lb 9 oz)  BMI 29.24 kg/m2  SpO2 97%  HEMODYNAMICS:    VENTILATOR SETTINGS:    INTAKE / OUTPUT: I/O last 3 completed shifts: In: 1160 [P.O.:1160] Out: 4240 [Urine:140; Other:4100]  PHYSICAL EXAMINATION: General: RASS 0, + F/C Neuro: No focal deficits, diffusely weak HEENT: WNL Cardiovascular: Reg, no M Lungs: clear anteriorly Abdomen: soft, NT, ND, + BS Ext: warm, improving BLE edema, several digits with small areas of necrosis on tips, multiple bullae on BUE and BLE GU: improving scrotal and penile edema  LABS:  BMET  Recent Labs Lab 09/11/15 0446 09/12/15 0232 09/13/15 0430  NA 140 140 137  K 3.8 3.7 3.7  CL 102 102 100*  CO2 31 30 31   BUN 56* 39* 35*  CREATININE 3.15* 2.88* 3.05*  GLUCOSE 90  94 95    Electrolytes  Recent Labs Lab 09/08/15 0500 09/09/15 0520 09/10/15 0604 09/11/15 0446 09/12/15 0232 09/13/15 0430  CALCIUM 7.1* 7.3* 7.5* 7.6* 7.5* 7.4*  MG 2.7* 2.8* 2.8*  --  2.2  --   PHOS 6.2* 8.1* 7.2*  --   --   --     CBC  Recent Labs Lab 09/10/15 0604 09/11/15 0446 09/12/15 0232  WBC 18.5* 11.7* 8.4  HGB 7.5* 7.1* 7.0*  HCT 23.2* 22.0* 21.1*  PLT 148* 128* 135*    Coag's No results for input(s): APTT, INR in the last 168 hours.  Sepsis Markers No results for input(s): LATICACIDVEN, PROCALCITON, O2SATVEN in the last 168 hours.  ABG  Recent Labs Lab 09/06/15 1100 09/07/15 1100 09/08/15 1839  PHART 7.37 7.44 7.48*  PCO2ART 45 39 47  PO2ART 71* 64* 74*    Liver Enzymes  Recent Labs Lab 09/07/15 0515 09/10/15 0604 09/11/15 0446  AST 103* 45* 39  ALT 57 42 35  ALKPHOS 87 67 51  BILITOT 0.5 0.5 0.5  ALBUMIN 2.4* 2.7* 2.4*    Cardiac Enzymes No results for input(s): TROPONINI, PROBNP in the last 168 hours.  Glucose  Recent Labs Lab 09/10/15 0019 09/10/15 0732 09/11/15 0101 09/11/15 0451 09/11/15 1723 09/11/15 2031  GLUCAP 102* 114* 70 83 86 85    CXR (09/12/15): improving LLL AS dz   ASSESSMENT / PLAN (at time of discharge):  PULMONARY A: Acute hypoxic resp failure, resolved Pulmonary edema pattern, resolved Stenotrophomonas VAP, treated P:   Cont supplemental O2 as needed to maintain SpO2 > 92%   CARDIOVASCULAR A:  Shock, resolved Sinus tachycardia, resolved Hypertension, controlled Poor venous access due to severe edema - he has an L [place] CVL that has been present since 03/28 P:  Monitor Cont PRN metoprolol Cont PRN hydralazine [facility]  RENAL A:   AKI, oliguric - some response to furosemide 04/11. Repeat dose of Lasix given 04/13 Rhabdomyolysis - CK remains elevated Severe lactic acidosis, resolved Severe hypervolemia P:   Monitor BMET  intermittently Monitor I/Os Correct electrolytes as indicated [facility] re: [facility] placement will need to be made  GASTROINTESTINAL A:   Heme + stools Mildly elevated LFTs Post extubation dysphagia P:   SUP: N/I post extubation Dysphagia I diet SLP following  HEMATOLOGIC A:   Hemoconcentration, resolved ICU acquired anemia  S/P PRBCs 04/05, 04/09 Severe thrombocytopenia, improving P:  DVT px: SQ heparin (initiated 04/10) Monitor CBC intermittently Transfuse per usual guidelines Monitor H/H and plt count closely on SQ heparin  INFECTIOUS A:   Severe sepsis - resolved Stenotrophomonas PNA, fully treated and clinically resolved P:   Monitor temp, WBC count Micro and abx as above  ENDOCRINE A:   Mild stress induced hyperglycemia, resolved P:   Cont CBG q 8 hrs with no SSI  NEUROLOGIC A:   Acute ICU acquired encephalopathy - improving Pain, controlled Agitation, resolved P:   RASS goal: 0 Cont low dose PRN hydromorphone and lorazepam  DC medications: Heparin 5000 units SQ q 8 hrs Hydralazine 10-40 mg IV q 4 hrs PRN to maintain SBP <  170 mmHg Hydromorphone 0.5 mg IV q 2 hrs PRN pain Lorazepam 0.5-1.0 mg IV q 4 hrs PRN agitation    Wife updated @ bedside. Family is enthusiastic about transfer and rehab. If there are any questions from the receiving MD, please contact me on the numbers listed below   Billy Fischer, MD PCCM service Mobile [phone] Pager [phone]

## 2015-09-14 ENCOUNTER — Other Ambulatory Visit (HOSPITAL_COMMUNITY): Payer: Self-pay

## 2015-09-14 DIAGNOSIS — I129 Hypertensive chronic kidney disease with stage 1 through stage 4 chronic kidney disease, or unspecified chronic kidney disease: Secondary | ICD-10-CM | POA: Diagnosis not present

## 2015-09-14 DIAGNOSIS — N179 Acute kidney failure, unspecified: Secondary | ICD-10-CM | POA: Diagnosis not present

## 2015-09-14 DIAGNOSIS — R601 Generalized edema: Secondary | ICD-10-CM | POA: Diagnosis not present

## 2015-09-14 DIAGNOSIS — J9690 Respiratory failure, unspecified, unspecified whether with hypoxia or hypercapnia: Secondary | ICD-10-CM | POA: Diagnosis not present

## 2015-09-14 DIAGNOSIS — Z9981 Dependence on supplemental oxygen: Secondary | ICD-10-CM | POA: Diagnosis not present

## 2015-09-14 LAB — CBC WITH DIFFERENTIAL/PLATELET
Basophils Absolute: 0 10*3/uL (ref 0.0–0.1)
Basophils Relative: 0 %
EOS ABS: 0.2 10*3/uL (ref 0.0–0.7)
EOS PCT: 3 %
HCT: 21.9 % — ABNORMAL LOW (ref 39.0–52.0)
Hemoglobin: 6.6 g/dL — CL (ref 13.0–17.0)
LYMPHS ABS: 1.2 10*3/uL (ref 0.7–4.0)
LYMPHS PCT: 20 %
MCH: 29.3 pg (ref 26.0–34.0)
MCHC: 30.1 g/dL (ref 30.0–36.0)
MCV: 97.3 fL (ref 78.0–100.0)
MONO ABS: 0.6 10*3/uL (ref 0.1–1.0)
Monocytes Relative: 9 %
Neutro Abs: 4.2 10*3/uL (ref 1.7–7.7)
Neutrophils Relative %: 68 %
PLATELETS: 152 10*3/uL (ref 150–400)
RBC: 2.25 MIL/uL — AB (ref 4.22–5.81)
RDW: 17.9 % — AB (ref 11.5–15.5)
WBC: 6.2 10*3/uL (ref 4.0–10.5)

## 2015-09-14 LAB — PREPARE RBC (CROSSMATCH)

## 2015-09-14 LAB — COMPREHENSIVE METABOLIC PANEL
ALT: 32 U/L (ref 17–63)
AST: 39 U/L (ref 15–41)
Albumin: 2.3 g/dL — ABNORMAL LOW (ref 3.5–5.0)
Alkaline Phosphatase: 52 U/L (ref 38–126)
Anion gap: 11 (ref 5–15)
BILIRUBIN TOTAL: 0.5 mg/dL (ref 0.3–1.2)
BUN: 33 mg/dL — AB (ref 6–20)
CHLORIDE: 104 mmol/L (ref 101–111)
CO2: 29 mmol/L (ref 22–32)
CREATININE: 4.06 mg/dL — AB (ref 0.61–1.24)
Calcium: 7.9 mg/dL — ABNORMAL LOW (ref 8.9–10.3)
GFR calc Af Amer: 18 mL/min — ABNORMAL LOW (ref >60)
GFR, EST NON AFRICAN AMERICAN: 16 mL/min — AB (ref >60)
GLUCOSE: 90 mg/dL (ref 65–99)
Potassium: 4.2 mmol/L (ref 3.5–5.1)
Sodium: 144 mmol/L (ref 135–145)
TOTAL PROTEIN: 4.5 g/dL — AB (ref 6.5–8.1)

## 2015-09-14 LAB — MAGNESIUM: MAGNESIUM: 2 mg/dL (ref 1.7–2.4)

## 2015-09-14 LAB — PROTIME-INR
INR: 1.41 (ref 0.00–1.49)
Prothrombin Time: 17.3 seconds — ABNORMAL HIGH (ref 11.6–15.2)

## 2015-09-14 LAB — ABO/RH: ABO/RH(D): O POS

## 2015-09-14 LAB — PHOSPHORUS: Phosphorus: 5.3 mg/dL — ABNORMAL HIGH (ref 2.5–4.6)

## 2015-09-14 NOTE — Progress Notes (Signed)
[facility]  ROUNDING NOTE   Subjective:  [facility] Doing well today Urine output appears to have improved No shortness of breath Continues to have large amount of edema Blood transfusion given today   Objective:  Vital signs in last 24 hours:   Vitals temperature 99.6, pulse 88, respirations 16, blood pressure 122/71  Weight change:  There were no vitals filed for this visit.  Intake/Output:     Intake/Output this shift:     Physical Exam: General: No acute distress  Head: [place]/AT EOMI hearing intact  Eyes: EOMI  Neck: trachea midline,    Lungs:  Clear ant/lat, decreased breath sounds at bases  Heart: S1S2 no rubs  Abdomen:  Soft, nontender  Extremities: ++ dependent edema  Neurologic: Awake, alert, following commands  Skin: Mottling right hand dorsum of fingers  Access: Left IJ dialysis cathter (4/2) Dr Sung Amabile    Basic Metabolic Panel:  Recent Labs Lab 09/08/15 0500 09/09/15 0520 09/10/15 0604 09/11/15 0446 09/12/15 0232 09/13/15 0430 09/14/15 0648  NA 132* 138 138 140 140 137 144  K 5.0 4.8 4.7 3.8 3.7 3.7 4.2  CL 98* 101 101 102 102 100* 104  CO2 GLUCOSE 150* 189* 128* 90 94 95 90  BUN 73* 78* 77* 56* 39* 35* 33*  CREATININE 3.42* 3.55* 3.68* 3.15* 2.88* 3.05* 4.06*  CALCIUM 7.1* 7.3* 7.5* 7.6* 7.5* 7.4* 7.9*  MG 2.7* 2.8* 2.8*  --  2.2  --  2.0  PHOS 6.2* 8.1* 7.2*  --   --   --  5.3*    Liver Function Tests:  Recent Labs Lab 09/10/15 0604 09/11/15 0446 09/14/15 0648  AST 45* 39 39  ALT 42 35 32  ALKPHOS 67 51 52  BILITOT 0.5 0.5 0.5  PROT 5.1* 4.5* 4.5*  ALBUMIN 2.7* 2.4* 2.3*   No results for input(s): LIPASE, AMYLASE in the last 168 hours. No results for input(s): AMMONIA in the last 168 hours.  CBC:  Recent Labs Lab 09/09/15 0520 09/10/15 0604 09/11/15 0446 09/12/15 0232 09/14/15 0648  WBC 13.7* 18.5* 11.7* 8.4 6.2  NEUTROABS  --   --   --    --  4.2  HGB 6.2* 7.5* 7.1* 7.0* 6.6*  HCT 19.2* 23.2* 22.0* 21.1* 21.9*  MCV 91.8 91.4 91.9 91.5 97.3  PLT 128* 148* 128* 135* 152    Cardiac Enzymes:  Recent Labs Lab 09/10/15 0604 09/11/15 0446  CKTOTAL 801* 764*    BNP: Invalid input(s): POCBNP  CBG:  Recent Labs Lab 09/10/15 0732 09/11/15 0101 09/11/15 0451 09/11/15 1723 09/11/15 2031  GLUCAP 114* 70 83 86 85    Microbiology: Results for orders placed or performed during the hospital encounter of 08/27/15  Rapid Influenza A&B Antigens ([facility] only)     Status: None   Collection Time: 08/27/15 12:22 PM  Result Value Ref Range Status   Influenza A ([facility]) NEGATIVE NEGATIVE Final   Influenza B ([facility]) NEGATIVE NEGATIVE Final  Urine culture     Status: None   Collection Time: 08/27/15  7:40 PM  Result Value Ref Range Status   Specimen Description URINE, RANDOM  Final   Special Requests Normal  Final   Culture NO GROWTH 1 DAY  Final   Report Status 08/29/2015 FINAL  Final  MRSA PCR Screening     Status: None   Collection Time: 08/28/15  5:25 PM  Result Value  Ref Range Status   MRSA by PCR NEGATIVE NEGATIVE Final    Comment:        The GeneXpert MRSA Assay (FDA approved for NASAL specimens only), is one component of a comprehensive MRSA colonization surveillance program. It is not intended to diagnose MRSA infection nor to guide or monitor treatment for MRSA infections.   Culture, blood (Routine X 2) w Reflex to ID Panel     Status: None   Collection Time: 08/28/15  5:51 PM  Result Value Ref Range Status   Specimen Description BLOOD RIGHT HAND  Final   Special Requests BAA,ANA,AER,5ML  Final   Culture NO GROWTH 5 DAYS  Final   Report Status 09/02/2015 FINAL  Final  Culture, blood (Routine X 2) w Reflex to ID Panel     Status: None   Collection Time: 08/28/15  5:51 PM  Result Value Ref Range Status   Specimen Description BLOOD RIGHT ASSIST CONTROL  Final   Special Requests BAA,ANA,AER,5ML  Final    Culture NO GROWTH 5 DAYS  Final   Report Status 09/02/2015 FINAL  Final  Culture, respiratory (NON-Expectorated)     Status: None   Collection Time: 08/29/15  2:16 PM  Result Value Ref Range Status   Specimen Description TRACHEAL ASPIRATE  Final   Special Requests NONE  Final   Gram Stain FEW WBC SEEN FEW YEAST   Final   Culture Consistent with normal respiratory flora.  Final   Report Status 09/01/2015 FINAL  Final  Culture, respiratory (NON-Expectorated)     Status: None   Collection Time: 08/31/15  3:45 PM  Result Value Ref Range Status   Specimen Description TRACHEAL ASPIRATE  Final   Special Requests NONE  Final   Gram Stain   Final    FEW WBC SEEN MANY GRAM POSITIVE RODS MODERATE GRAM POSITIVE COCCI RARE YEAST    Culture HEAVY GROWTH ENTEROCOCCUS FAECALIS  Final   Report Status 09/03/2015 FINAL  Final   Organism ID, Bacteria ENTEROCOCCUS FAECALIS  Final      Susceptibility   Enterococcus faecalis - MIC*    AMPICILLIN <=2 SENSITIVE Sensitive     VANCOMYCIN 1 SENSITIVE Sensitive     GENTAMICIN SYNERGY SENSITIVE Sensitive     LINEZOLID 2 SENSITIVE Sensitive     * HEAVY GROWTH ENTEROCOCCUS FAECALIS  Culture, blood (Routine X 2) w Reflex to ID Panel     Status: None   Collection Time: 09/02/15  3:18 PM  Result Value Ref Range Status   Specimen Description BLOOD LINE  Final   Special Requests   Final    BOTTLES DRAWN AEROBIC AND ANAEROBIC  AER 4CC ANA 1CC   Culture NO GROWTH 5 DAYS  Final   Report Status 09/07/2015 FINAL  Final  Culture, blood (Routine X 2) w Reflex to ID Panel     Status: None   Collection Time: 09/02/15  3:20 PM  Result Value Ref Range Status   Specimen Description BLOOD RIGHT ARM  Final   Special Requests BOTTLES DRAWN AEROBIC AND ANAEROBIC  8CC  Final   Culture NO GROWTH 5 DAYS  Final   Report Status 09/07/2015 FINAL  Final  Culture, respiratory (NON-Expectorated)     Status: None   Collection Time: 09/02/15  4:30 PM  Result Value Ref Range  Status   Specimen Description TRACHEAL ASPIRATE  Final   Special Requests NONE  Final   Gram Stain   Final    MODERATE WBC SEEN FEW  GRAM POSITIVE COCCI IN PAIRS    Culture LIGHT GROWTH STENOTROPHOMONAS MALTOPHILIA  Final   Report Status 09/06/2015 FINAL  Final   Organism ID, Bacteria STENOTROPHOMONAS MALTOPHILIA  Final      Susceptibility   Stenotrophomonas maltophilia - MIC*    LEVOFLOXACIN <=0.12 SENSITIVE Sensitive     TRIMETH/SULFA <=20 SENSITIVE Sensitive     * LIGHT GROWTH STENOTROPHOMONAS MALTOPHILIA  C difficile quick scan w PCR reflex     Status: None   Collection Time: 09/02/15  4:56 PM  Result Value Ref Range Status   C Diff antigen NEGATIVE NEGATIVE Final   C Diff toxin NEGATIVE NEGATIVE Final   C Diff interpretation Negative for C. difficile  Final    Coagulation Studies:  Recent Labs  09/14/15 0648  LABPROT 17.3*  INR 1.41    Urinalysis: No results for input(s): COLORURINE, LABSPEC, PHURINE, GLUCOSEU, HGBUR, BILIRUBINUR, KETONESUR, PROTEINUR, UROBILINOGEN, NITRITE, LEUKOCYTESUR in the last 72 hours.  Invalid input(s): APPERANCEUR    Imaging: Dg Chest Port 1 View  09/14/2015  CLINICAL DATA:  Respiratory failure.  Renal failure. EXAM: PORTABLE CHEST 1 VIEW COMPARISON:  09/12/2015 . FINDINGS: Dual lumen left IJ catheter, left subclavian catheter stable position. Cardiomegaly with persistent bilateral pulmonary infiltrates/edema. Small left pleural effusion. Similar findings noted on prior exam . No pneumothorax . IMPRESSION: 1. Dual lumen left IJ catheter, and left subclavian line in stable position . 2. Cardiomegaly with persistent unchanged bilateral pulmonary infiltrates consistent pulmonary edema and/or pneumonia. Small left pleural effusion. Electronically Signed   By: Maisie Fushomas  Register   On: 09/14/2015 07:38     Medications:       Assessment/ Plan:  John Hoover is a 50 y.o. white male with no specific past medical history, who was admitted  to [facility] on 08/27/2015 + Ibuprofen use prior to admission (600 mg TID x 2 days)  1. Acute Renal Failure,   Likely secondary to ATN 2. Metabolic Acidosis 3. Rhabdomyolysis with hyperphosphatemia 4. Acute respiratory failure, Stenotrophomonas PNA, extubated 09/10/15. 5. Influenza A positive 6. Hypoalbuminemia 7. Anasarca/Generalized edema 8. Anemia and Thrombocytopenia   9. Hyperkalemia.  Plan: Urine output appears to have increased.  The patient has generalized anasarca which has improved over the past several days. However overall he is still volume overloaded. Monitor electrolytes and volume status along with urine output We will hold further doses for now Will evaluate for dialysis again on Monday    LOS:  John Hoover 4/14/20174:46 PM

## 2015-09-15 DIAGNOSIS — I129 Hypertensive chronic kidney disease with stage 1 through stage 4 chronic kidney disease, or unspecified chronic kidney disease: Secondary | ICD-10-CM | POA: Diagnosis not present

## 2015-09-15 DIAGNOSIS — N179 Acute kidney failure, unspecified: Secondary | ICD-10-CM | POA: Diagnosis not present

## 2015-09-15 DIAGNOSIS — Z9981 Dependence on supplemental oxygen: Secondary | ICD-10-CM | POA: Diagnosis not present

## 2015-09-15 DIAGNOSIS — J9690 Respiratory failure, unspecified, unspecified whether with hypoxia or hypercapnia: Secondary | ICD-10-CM | POA: Diagnosis not present

## 2015-09-15 LAB — BASIC METABOLIC PANEL
ANION GAP: 9 (ref 5–15)
BUN: 47 mg/dL — ABNORMAL HIGH (ref 6–20)
CALCIUM: 7.7 mg/dL — AB (ref 8.9–10.3)
CO2: 28 mmol/L (ref 22–32)
CREATININE: 5.19 mg/dL — AB (ref 0.61–1.24)
Chloride: 104 mmol/L (ref 101–111)
GFR, EST AFRICAN AMERICAN: 14 mL/min — AB (ref >60)
GFR, EST NON AFRICAN AMERICAN: 12 mL/min — AB (ref >60)
Glucose, Bld: 91 mg/dL (ref 65–99)
Potassium: 4 mmol/L (ref 3.5–5.1)
Sodium: 141 mmol/L (ref 135–145)

## 2015-09-15 LAB — TYPE AND SCREEN
ABO/RH(D): O POS
Antibody Screen: NEGATIVE
UNIT DIVISION: 0
UNIT DIVISION: 0

## 2015-09-15 LAB — HEMOGLOBIN AND HEMATOCRIT, BLOOD
HEMATOCRIT: 28.6 % — AB (ref 39.0–52.0)
HEMOGLOBIN: 8.8 g/dL — AB (ref 13.0–17.0)

## 2015-09-15 LAB — CK: CK TOTAL: 517 U/L — AB (ref 49–397)

## 2015-09-16 DIAGNOSIS — J9690 Respiratory failure, unspecified, unspecified whether with hypoxia or hypercapnia: Secondary | ICD-10-CM | POA: Diagnosis not present

## 2015-09-16 DIAGNOSIS — N179 Acute kidney failure, unspecified: Secondary | ICD-10-CM | POA: Diagnosis not present

## 2015-09-16 DIAGNOSIS — Z992 Dependence on renal dialysis: Secondary | ICD-10-CM | POA: Diagnosis not present

## 2015-09-16 DIAGNOSIS — Z9911 Dependence on respirator [ventilator] status: Secondary | ICD-10-CM | POA: Diagnosis not present

## 2015-09-16 LAB — BASIC METABOLIC PANEL
ANION GAP: 12 (ref 5–15)
BUN: 54 mg/dL — ABNORMAL HIGH (ref 6–20)
CHLORIDE: 100 mmol/L — AB (ref 101–111)
CO2: 26 mmol/L (ref 22–32)
CREATININE: 6.96 mg/dL — AB (ref 0.61–1.24)
Calcium: 7.9 mg/dL — ABNORMAL LOW (ref 8.9–10.3)
GFR calc non Af Amer: 8 mL/min — ABNORMAL LOW (ref >60)
GFR, EST AFRICAN AMERICAN: 9 mL/min — AB (ref >60)
Glucose, Bld: 102 mg/dL — ABNORMAL HIGH (ref 65–99)
Potassium: 4.1 mmol/L (ref 3.5–5.1)
SODIUM: 138 mmol/L (ref 135–145)

## 2015-09-16 LAB — CBC WITH DIFFERENTIAL/PLATELET
BASOS PCT: 0 %
Basophils Absolute: 0 10*3/uL (ref 0.0–0.1)
EOS ABS: 0.2 10*3/uL (ref 0.0–0.7)
Eosinophils Relative: 4 %
HCT: 26.4 % — ABNORMAL LOW (ref 39.0–52.0)
HEMOGLOBIN: 8.1 g/dL — AB (ref 13.0–17.0)
LYMPHS ABS: 1 10*3/uL (ref 0.7–4.0)
Lymphocytes Relative: 19 %
MCH: 28.9 pg (ref 26.0–34.0)
MCHC: 30.7 g/dL (ref 30.0–36.0)
MCV: 94.3 fL (ref 78.0–100.0)
Monocytes Absolute: 0.5 10*3/uL (ref 0.1–1.0)
Monocytes Relative: 9 %
NEUTROS PCT: 68 %
Neutro Abs: 3.7 10*3/uL (ref 1.7–7.7)
Platelets: 144 10*3/uL — ABNORMAL LOW (ref 150–400)
RBC: 2.8 MIL/uL — AB (ref 4.22–5.81)
RDW: 17.8 % — ABNORMAL HIGH (ref 11.5–15.5)
WBC: 5.4 10*3/uL (ref 4.0–10.5)

## 2015-09-17 DIAGNOSIS — J9600 Acute respiratory failure, unspecified whether with hypoxia or hypercapnia: Secondary | ICD-10-CM | POA: Diagnosis not present

## 2015-09-17 DIAGNOSIS — Z9981 Dependence on supplemental oxygen: Secondary | ICD-10-CM | POA: Diagnosis not present

## 2015-09-17 DIAGNOSIS — D649 Anemia, unspecified: Secondary | ICD-10-CM | POA: Diagnosis not present

## 2015-09-17 DIAGNOSIS — R601 Generalized edema: Secondary | ICD-10-CM | POA: Diagnosis not present

## 2015-09-17 DIAGNOSIS — J9690 Respiratory failure, unspecified, unspecified whether with hypoxia or hypercapnia: Secondary | ICD-10-CM | POA: Diagnosis not present

## 2015-09-17 DIAGNOSIS — N179 Acute kidney failure, unspecified: Secondary | ICD-10-CM | POA: Diagnosis not present

## 2015-09-17 LAB — PHOSPHORUS: PHOSPHORUS: 5.9 mg/dL — AB (ref 2.5–4.6)

## 2015-09-17 LAB — BASIC METABOLIC PANEL
ANION GAP: 11 (ref 5–15)
BUN: 58 mg/dL — ABNORMAL HIGH (ref 6–20)
CALCIUM: 7.8 mg/dL — AB (ref 8.9–10.3)
CHLORIDE: 102 mmol/L (ref 101–111)
CO2: 27 mmol/L (ref 22–32)
CREATININE: 7.37 mg/dL — AB (ref 0.61–1.24)
GFR calc non Af Amer: 8 mL/min — ABNORMAL LOW (ref >60)
GFR, EST AFRICAN AMERICAN: 9 mL/min — AB (ref >60)
Glucose, Bld: 97 mg/dL (ref 65–99)
Potassium: 3.9 mmol/L (ref 3.5–5.1)
SODIUM: 140 mmol/L (ref 135–145)

## 2015-09-17 LAB — CBC WITH DIFFERENTIAL/PLATELET
Basophils Absolute: 0 10*3/uL (ref 0.0–0.1)
Basophils Relative: 0 %
EOS ABS: 0.2 10*3/uL (ref 0.0–0.7)
Eosinophils Relative: 4 %
HEMATOCRIT: 23.8 % — AB (ref 39.0–52.0)
HEMOGLOBIN: 7.5 g/dL — AB (ref 13.0–17.0)
LYMPHS PCT: 20 %
Lymphs Abs: 1.1 10*3/uL (ref 0.7–4.0)
MCH: 29.8 pg (ref 26.0–34.0)
MCHC: 31.5 g/dL (ref 30.0–36.0)
MCV: 94.4 fL (ref 78.0–100.0)
MONO ABS: 0.6 10*3/uL (ref 0.1–1.0)
Monocytes Relative: 11 %
NEUTROS PCT: 65 %
Neutro Abs: 3.4 10*3/uL (ref 1.7–7.7)
PLATELETS: 123 10*3/uL — AB (ref 150–400)
RBC: 2.52 MIL/uL — AB (ref 4.22–5.81)
RDW: 17.4 % — ABNORMAL HIGH (ref 11.5–15.5)
WBC: 5.3 10*3/uL (ref 4.0–10.5)

## 2015-09-17 LAB — HEMOGLOBIN A1C
Hgb A1c MFr Bld: 5.9 % — ABNORMAL HIGH (ref 4.8–5.6)
MEAN PLASMA GLUCOSE: 123 mg/dL

## 2015-09-17 NOTE — Progress Notes (Signed)
[facility]  ROUNDING NOTE   Subjective:  [facility] Doing better. No shortness of breath Continues to have large amount of edema S Creatinine is higher but states that Urine output appears to have improved Working with physical therapy Reports low grade fever this afternoon    Objective:  Vital signs in last 24 hours:   Vitals temperature 99.8, pulse 95, respirations 16, blood pressure 127/75    Weight change:  There were no vitals filed for this visit.  Intake/Output:     Intake/Output this shift:     Physical Exam: General: No acute distress  H/ENT [place]/AT  hearing intact  Eyes: EOMI  Neck: trachea midline,    Lungs:  Clear ant/lat, decreased breath sounds at bases  Heart: S1S2 no rubs  Abdomen:  Soft, nontender  Extremities: ++ dependent edema  Neurologic: Awake, alert, following commands  Skin: Mottling right hand dorsum of fingers  Access: Left IJ dialysis cathter (4/2) Dr Sung AmabileSimonds    Basic Metabolic Panel:  Recent Labs Lab 09/12/15 0232 09/13/15 0430 09/14/15 0648 09/15/15 0700 09/16/15 1030 09/17/15 0630  NA 140 137 144 141 138 140  K 3.7 3.7 4.2 4.0 4.1 3.9  CL 102 100* 104 104 100* 102  CO2 30 31 29 28 26 27   GLUCOSE 94 95 90 91 102* 97  BUN 39* 35* 33* 47* 54* 58*  CREATININE 2.88* 3.05* 4.06* 5.19* 6.96* 7.37*  CALCIUM 7.5* 7.4* 7.9* 7.7* 7.9* 7.8*  MG 2.2  --  2.0  --   --   --   PHOS  --   --  5.3*  --   --  5.9*    Liver Function Tests:  Recent Labs Lab 09/11/15 0446 09/14/15 0648  AST 39 39  ALT 35 32  ALKPHOS 51 52  BILITOT 0.5 0.5  PROT 4.5* 4.5*  ALBUMIN 2.4* 2.3*   No results for input(s): LIPASE, AMYLASE in the last 168 hours. No results for input(s): AMMONIA in the last 168 hours.  CBC:  Recent Labs Lab 09/11/15 0446 09/12/15 0232 09/14/15 0648 09/15/15 1140 09/16/15 1030 09/17/15 0630  WBC 11.7* 8.4 6.2  --  5.4 5.3  NEUTROABS  --   --  4.2  --  3.7 3.4   HGB 7.1* 7.0* 6.6* 8.8* 8.1* 7.5*  HCT 22.0* 21.1* 21.9* 28.6* 26.4* 23.8*  MCV 91.9 91.5 97.3  --  94.3 94.4  PLT 128* 135* 152  --  144* 123*    Cardiac Enzymes:  Recent Labs Lab 09/11/15 0446 09/15/15 0700  CKTOTAL 764* 517*    BNP: Invalid input(s): POCBNP  CBG:  Recent Labs Lab 09/11/15 0101 09/11/15 0451 09/11/15 1723 09/11/15 2031  GLUCAP 70 83 86 85    Microbiology: Results for orders placed or performed during the hospital encounter of 08/27/15  Rapid Influenza A&B Antigens ([facility] only)     Status: None   Collection Time: 08/27/15 12:22 PM  Result Value Ref Range Status   Influenza A ([facility]) NEGATIVE NEGATIVE Final   Influenza B ([facility]) NEGATIVE NEGATIVE Final  Urine culture     Status: None   Collection Time: 08/27/15  7:40 PM  Result Value Ref Range Status   Specimen Description URINE, RANDOM  Final   Special Requests Normal  Final   Culture NO GROWTH 1 DAY  Final   Report Status 08/29/2015 FINAL  Final  MRSA PCR Screening     Status: None  Collection Time: 08/28/15  5:25 PM  Result Value Ref Range Status   MRSA by PCR NEGATIVE NEGATIVE Final    Comment:        The GeneXpert MRSA Assay (FDA approved for NASAL specimens only), is one component of a comprehensive MRSA colonization surveillance program. It is not intended to diagnose MRSA infection nor to guide or monitor treatment for MRSA infections.   Culture, blood (Routine X 2) w Reflex to ID Panel     Status: None   Collection Time: 08/28/15  5:51 PM  Result Value Ref Range Status   Specimen Description BLOOD RIGHT HAND  Final   Special Requests BAA,ANA,AER,5ML  Final   Culture NO GROWTH 5 DAYS  Final   Report Status 09/02/2015 FINAL  Final  Culture, blood (Routine X 2) w Reflex to ID Panel     Status: None   Collection Time: 08/28/15  5:51 PM  Result Value Ref Range Status   Specimen Description BLOOD RIGHT ASSIST CONTROL  Final   Special Requests BAA,ANA,AER,5ML  Final   Culture NO  GROWTH 5 DAYS  Final   Report Status 09/02/2015 FINAL  Final  Culture, respiratory (NON-Expectorated)     Status: None   Collection Time: 08/29/15  2:16 PM  Result Value Ref Range Status   Specimen Description TRACHEAL ASPIRATE  Final   Special Requests NONE  Final   Gram Stain FEW WBC SEEN FEW YEAST   Final   Culture Consistent with normal respiratory flora.  Final   Report Status 09/01/2015 FINAL  Final  Culture, respiratory (NON-Expectorated)     Status: None   Collection Time: 08/31/15  3:45 PM  Result Value Ref Range Status   Specimen Description TRACHEAL ASPIRATE  Final   Special Requests NONE  Final   Gram Stain   Final    FEW WBC SEEN MANY GRAM POSITIVE RODS MODERATE GRAM POSITIVE COCCI RARE YEAST    Culture HEAVY GROWTH ENTEROCOCCUS FAECALIS  Final   Report Status 09/03/2015 FINAL  Final   Organism ID, Bacteria ENTEROCOCCUS FAECALIS  Final      Susceptibility   Enterococcus faecalis - MIC*    AMPICILLIN <=2 SENSITIVE Sensitive     VANCOMYCIN 1 SENSITIVE Sensitive     GENTAMICIN SYNERGY SENSITIVE Sensitive     LINEZOLID 2 SENSITIVE Sensitive     * HEAVY GROWTH ENTEROCOCCUS FAECALIS  Culture, blood (Routine X 2) w Reflex to ID Panel     Status: None   Collection Time: 09/02/15  3:18 PM  Result Value Ref Range Status   Specimen Description BLOOD LINE  Final   Special Requests   Final    BOTTLES DRAWN AEROBIC AND ANAEROBIC  AER 4CC ANA 1CC   Culture NO GROWTH 5 DAYS  Final   Report Status 09/07/2015 FINAL  Final  Culture, blood (Routine X 2) w Reflex to ID Panel     Status: None   Collection Time: 09/02/15  3:20 PM  Result Value Ref Range Status   Specimen Description BLOOD RIGHT ARM  Final   Special Requests BOTTLES DRAWN AEROBIC AND ANAEROBIC  8CC  Final   Culture NO GROWTH 5 DAYS  Final   Report Status 09/07/2015 FINAL  Final  Culture, respiratory (NON-Expectorated)     Status: None   Collection Time: 09/02/15  4:30 PM  Result Value Ref Range Status    Specimen Description TRACHEAL ASPIRATE  Final   Special Requests NONE  Final   Gram Stain  Final    MODERATE WBC SEEN FEW GRAM POSITIVE COCCI IN PAIRS    Culture LIGHT GROWTH STENOTROPHOMONAS MALTOPHILIA  Final   Report Status 09/06/2015 FINAL  Final   Organism ID, Bacteria STENOTROPHOMONAS MALTOPHILIA  Final      Susceptibility   Stenotrophomonas maltophilia - MIC*    LEVOFLOXACIN <=0.12 SENSITIVE Sensitive     TRIMETH/SULFA <=20 SENSITIVE Sensitive     * LIGHT GROWTH STENOTROPHOMONAS MALTOPHILIA  C difficile quick scan w PCR reflex     Status: None   Collection Time: 09/02/15  4:56 PM  Result Value Ref Range Status   C Diff antigen NEGATIVE NEGATIVE Final   C Diff toxin NEGATIVE NEGATIVE Final   C Diff interpretation Negative for C. difficile  Final    Coagulation Studies: No results for input(s): LABPROT, INR in the last 72 hours.  Urinalysis: No results for input(s): COLORURINE, LABSPEC, PHURINE, GLUCOSEU, HGBUR, BILIRUBINUR, KETONESUR, PROTEINUR, UROBILINOGEN, NITRITE, LEUKOCYTESUR in the last 72 hours.  Invalid input(s): APPERANCEUR    Imaging: No results found.   Medications:       Assessment/ Plan:  Mr. John Hoover is a 50 y.o. white male with no specific past medical history, who was admitted to [facility] on 08/27/2015 + Ibuprofen use prior to admission (600 mg TID x 2 days). [facility]  1. Acute Renal Failure,   Likely secondary to ATN 2. Metabolic Acidosis 3. Rhabdomyolysis with hyperphosphatemia 4. Acute respiratory failure, Stenotrophomonas PNA, extubated 09/10/15. 5. Influenza A positive 6. Hypoalbuminemia 7. Anasarca/Generalized edema 8. Anemia and Thrombocytopenia   9. Hyperkalemia.  Plan: Urine output appears to have increased.  The patient has generalized anasarca which is improving over the past several days. However overall he is still volume overloaded. Monitor electrolytes and volume status along with urine output We  will hold further dialysis for now D/c dialysis cathter and central line as he is starting to develop low grade fever If he needs further HD, will need a new dialysis cathter.   LOS:  Mosetta Pigeon 4/17/20174:16 PM

## 2015-09-18 DIAGNOSIS — Z9981 Dependence on supplemental oxygen: Secondary | ICD-10-CM | POA: Diagnosis not present

## 2015-09-18 DIAGNOSIS — J9600 Acute respiratory failure, unspecified whether with hypoxia or hypercapnia: Secondary | ICD-10-CM | POA: Diagnosis not present

## 2015-09-18 DIAGNOSIS — N179 Acute kidney failure, unspecified: Secondary | ICD-10-CM | POA: Diagnosis not present

## 2015-09-18 DIAGNOSIS — D649 Anemia, unspecified: Secondary | ICD-10-CM | POA: Diagnosis not present

## 2015-09-18 LAB — CBC WITH DIFFERENTIAL/PLATELET
BASOS ABS: 0 10*3/uL (ref 0.0–0.1)
BASOS PCT: 0 %
EOS ABS: 0.3 10*3/uL (ref 0.0–0.7)
Eosinophils Relative: 6 %
HCT: 24.3 % — ABNORMAL LOW (ref 39.0–52.0)
HEMOGLOBIN: 7.6 g/dL — AB (ref 13.0–17.0)
LYMPHS ABS: 1.3 10*3/uL (ref 0.7–4.0)
LYMPHS PCT: 26 %
MCH: 29.3 pg (ref 26.0–34.0)
MCHC: 31.3 g/dL (ref 30.0–36.0)
MCV: 93.8 fL (ref 78.0–100.0)
MONO ABS: 0.6 10*3/uL (ref 0.1–1.0)
Monocytes Relative: 13 %
NEUTROS ABS: 2.7 10*3/uL (ref 1.7–7.7)
Neutrophils Relative %: 55 %
PLATELETS: 141 10*3/uL — AB (ref 150–400)
RBC: 2.59 MIL/uL — ABNORMAL LOW (ref 4.22–5.81)
RDW: 17.4 % — AB (ref 11.5–15.5)
WBC: 4.9 10*3/uL (ref 4.0–10.5)

## 2015-09-18 LAB — RENAL FUNCTION PANEL
ANION GAP: 13 (ref 5–15)
Albumin: 2.2 g/dL — ABNORMAL LOW (ref 3.5–5.0)
BUN: 63 mg/dL — ABNORMAL HIGH (ref 6–20)
CALCIUM: 7.8 mg/dL — AB (ref 8.9–10.3)
CO2: 25 mmol/L (ref 22–32)
Chloride: 101 mmol/L (ref 101–111)
Creatinine, Ser: 7.38 mg/dL — ABNORMAL HIGH (ref 0.61–1.24)
GFR calc Af Amer: 9 mL/min — ABNORMAL LOW (ref >60)
GFR calc non Af Amer: 8 mL/min — ABNORMAL LOW (ref >60)
GLUCOSE: 95 mg/dL (ref 65–99)
PHOSPHORUS: 6 mg/dL — AB (ref 2.5–4.6)
POTASSIUM: 3.6 mmol/L (ref 3.5–5.1)
SODIUM: 139 mmol/L (ref 135–145)

## 2015-09-18 LAB — MAGNESIUM: MAGNESIUM: 2 mg/dL (ref 1.7–2.4)

## 2015-09-18 LAB — PHOSPHORUS: PHOSPHORUS: 5.9 mg/dL — AB (ref 2.5–4.6)

## 2015-09-19 ENCOUNTER — Telehealth: Payer: Self-pay | Admitting: Family Medicine

## 2015-09-19 ENCOUNTER — Other Ambulatory Visit (HOSPITAL_COMMUNITY): Payer: Self-pay

## 2015-09-19 DIAGNOSIS — Z9981 Dependence on supplemental oxygen: Secondary | ICD-10-CM | POA: Diagnosis not present

## 2015-09-19 DIAGNOSIS — R601 Generalized edema: Secondary | ICD-10-CM | POA: Diagnosis not present

## 2015-09-19 DIAGNOSIS — N179 Acute kidney failure, unspecified: Secondary | ICD-10-CM | POA: Diagnosis not present

## 2015-09-19 DIAGNOSIS — D649 Anemia, unspecified: Secondary | ICD-10-CM | POA: Diagnosis not present

## 2015-09-19 DIAGNOSIS — J9600 Acute respiratory failure, unspecified whether with hypoxia or hypercapnia: Secondary | ICD-10-CM | POA: Diagnosis not present

## 2015-09-19 LAB — RENAL FUNCTION PANEL
ANION GAP: 14 (ref 5–15)
Albumin: 2.3 g/dL — ABNORMAL LOW (ref 3.5–5.0)
BUN: 63 mg/dL — AB (ref 6–20)
CO2: 23 mmol/L (ref 22–32)
Calcium: 8.1 mg/dL — ABNORMAL LOW (ref 8.9–10.3)
Chloride: 104 mmol/L (ref 101–111)
Creatinine, Ser: 6.57 mg/dL — ABNORMAL HIGH (ref 0.61–1.24)
GFR calc Af Amer: 10 mL/min — ABNORMAL LOW (ref >60)
GFR, EST NON AFRICAN AMERICAN: 9 mL/min — AB (ref >60)
Glucose, Bld: 92 mg/dL (ref 65–99)
POTASSIUM: 3.5 mmol/L (ref 3.5–5.1)
Phosphorus: 5.7 mg/dL — ABNORMAL HIGH (ref 2.5–4.6)
Sodium: 141 mmol/L (ref 135–145)

## 2015-09-19 LAB — CBC WITH DIFFERENTIAL/PLATELET
Basophils Absolute: 0 10*3/uL (ref 0.0–0.1)
Basophils Relative: 0 %
EOS PCT: 5 %
Eosinophils Absolute: 0.3 10*3/uL (ref 0.0–0.7)
HEMATOCRIT: 25.1 % — AB (ref 39.0–52.0)
Hemoglobin: 8 g/dL — ABNORMAL LOW (ref 13.0–17.0)
LYMPHS ABS: 1.1 10*3/uL (ref 0.7–4.0)
Lymphocytes Relative: 22 %
MCH: 30.2 pg (ref 26.0–34.0)
MCHC: 31.9 g/dL (ref 30.0–36.0)
MCV: 94.7 fL (ref 78.0–100.0)
MONOS PCT: 11 %
Monocytes Absolute: 0.6 10*3/uL (ref 0.1–1.0)
NEUTROS ABS: 3.1 10*3/uL (ref 1.7–7.7)
Neutrophils Relative %: 62 %
Platelets: 150 10*3/uL (ref 150–400)
RBC: 2.65 MIL/uL — AB (ref 4.22–5.81)
RDW: 17.3 % — AB (ref 11.5–15.5)
WBC: 5.1 10*3/uL (ref 4.0–10.5)

## 2015-09-19 LAB — MAGNESIUM: MAGNESIUM: 2 mg/dL (ref 1.7–2.4)

## 2015-09-19 NOTE — Telephone Encounter (Signed)
Late entry.  I tried to call patient yesterday just to check in, given his recent illness.  I couldn't get through on either line listed or LMOVM.  App help of all involved.

## 2015-09-19 NOTE — Progress Notes (Signed)
[facility]  ROUNDING NOTE   Subjective:  [facility] Doing better. No shortness of breath Continues to have large amount of edema S Creatinine is starting to improve spontaneously States that he is urinating more Working with physical therapy. Walked to the door and back Reports low grade fever this afternoon  Dialysis catheter removed today, central line removed yesterday Urine output 1700 cc   Objective:  Vital signs in last 24 hours:   Vitals Temperature 98.9, pulse 97, respirations 18, blood pressure 144/83  Weight change:  There were no vitals filed for this visit.  Intake/Output:     Intake/Output this shift:     Physical Exam: General: No acute distress  H/ENT [place]/AT  hearing intact  Eyes: EOMI  Neck: trachea midline,    Lungs:  Clear ant/lat, decreased breath sounds at bases, [place] 1 L  Heart: S1S2 no rubs  Abdomen:  Soft, nontender  Extremities: ++ dependent edema  Neurologic: Awake, alert, following commands  Skin: Mottling right hand dorsum of fingers       Basic Metabolic Panel:  Recent Labs Lab 09/14/15 0648 09/15/15 0700 09/16/15 1030 09/17/15 0630 09/18/15 0630 09/19/15 0640  NA 144 141 138 140 139 141  K 4.2 4.0 4.1 3.9 3.6 3.5  CL 104 104 100* 102 101 104  CO2 29 28 26 27 25 23   GLUCOSE 90 91 102* 97 95 92  BUN 33* 47* 54* 58* 63* 63*  CREATININE 4.06* 5.19* 6.96* 7.37* 7.38* 6.57*  CALCIUM 7.9* 7.7* 7.9* 7.8* 7.8* 8.1*  MG 2.0  --   --   --  2.0 2.0  PHOS 5.3*  --   --  5.9* 5.9*  6.0* 5.7*    Liver Function Tests:  Recent Labs Lab 09/14/15 0648 09/18/15 0630 09/19/15 0640  AST 39  --   --   ALT 32  --   --   ALKPHOS 52  --   --   BILITOT 0.5  --   --   PROT 4.5*  --   --   ALBUMIN 2.3* 2.2* 2.3*   No results for input(s): LIPASE, AMYLASE in the last 168 hours. No results for input(s): AMMONIA in the last 168 hours.  CBC:  Recent Labs Lab 09/14/15 0648  09/15/15 1140 09/16/15 1030 09/17/15 0630 09/18/15 0630 09/19/15 0640  WBC 6.2  --  5.4 5.3 4.9 5.1  NEUTROABS 4.2  --  3.7 3.4 2.7 3.1  HGB 6.6* 8.8* 8.1* 7.5* 7.6* 8.0*  HCT 21.9* 28.6* 26.4* 23.8* 24.3* 25.1*  MCV 97.3  --  94.3 94.4 93.8 94.7  PLT 152  --  144* 123* 141* 150    Cardiac Enzymes:  Recent Labs Lab 09/15/15 0700  CKTOTAL 517*    BNP: Invalid input(s): POCBNP  CBG: No results for input(s): GLUCAP in the last 168 hours.  Microbiology: Results for orders placed or performed during the hospital encounter of 08/27/15  Rapid Influenza A&B Antigens [facility] only)     Status: None   Collection Time: 08/27/15 12:22 PM  Result Value Ref Range Status   Influenza A ([facility]) NEGATIVE NEGATIVE Final   Influenza B ([facility]) NEGATIVE NEGATIVE Final  Urine culture     Status: None   Collection Time: 08/27/15  7:40 PM  Result Value Ref Range Status   Specimen Description URINE, RANDOM  Final   Special Requests Normal  Final   Culture NO GROWTH 1 DAY  Final   Report Status 08/29/2015 FINAL  Final  MRSA PCR Screening     Status: None   Collection Time: 08/28/15  5:25 PM  Result Value Ref Range Status   MRSA by PCR NEGATIVE NEGATIVE Final    Comment:        The GeneXpert MRSA Assay (FDA approved for NASAL specimens only), is one component of a comprehensive MRSA colonization surveillance program. It is not intended to diagnose MRSA infection nor to guide or monitor treatment for MRSA infections.   Culture, blood (Routine X 2) w Reflex to ID Panel     Status: None   Collection Time: 08/28/15  5:51 PM  Result Value Ref Range Status   Specimen Description BLOOD RIGHT HAND  Final   Special Requests BAA,ANA,AER,5ML  Final   Culture NO GROWTH 5 DAYS  Final   Report Status 09/02/2015 FINAL  Final  Culture, blood (Routine X 2) w Reflex to ID Panel     Status: None   Collection Time: 08/28/15  5:51 PM  Result Value Ref Range Status   Specimen Description BLOOD RIGHT  ASSIST CONTROL  Final   Special Requests BAA,ANA,AER,5ML  Final   Culture NO GROWTH 5 DAYS  Final   Report Status 09/02/2015 FINAL  Final  Culture, respiratory (NON-Expectorated)     Status: None   Collection Time: 08/29/15  2:16 PM  Result Value Ref Range Status   Specimen Description TRACHEAL ASPIRATE  Final   Special Requests NONE  Final   Gram Stain FEW WBC SEEN FEW YEAST   Final   Culture Consistent with normal respiratory flora.  Final   Report Status 09/01/2015 FINAL  Final  Culture, respiratory (NON-Expectorated)     Status: None   Collection Time: 08/31/15  3:45 PM  Result Value Ref Range Status   Specimen Description TRACHEAL ASPIRATE  Final   Special Requests NONE  Final   Gram Stain   Final    FEW WBC SEEN MANY GRAM POSITIVE RODS MODERATE GRAM POSITIVE COCCI RARE YEAST    Culture HEAVY GROWTH ENTEROCOCCUS FAECALIS  Final   Report Status 09/03/2015 FINAL  Final   Organism ID, Bacteria ENTEROCOCCUS FAECALIS  Final      Susceptibility   Enterococcus faecalis - MIC*    AMPICILLIN <=2 SENSITIVE Sensitive     VANCOMYCIN 1 SENSITIVE Sensitive     GENTAMICIN SYNERGY SENSITIVE Sensitive     LINEZOLID 2 SENSITIVE Sensitive     * HEAVY GROWTH ENTEROCOCCUS FAECALIS  Culture, blood (Routine X 2) w Reflex to ID Panel     Status: None   Collection Time: 09/02/15  3:18 PM  Result Value Ref Range Status   Specimen Description BLOOD LINE  Final   Special Requests   Final    BOTTLES DRAWN AEROBIC AND ANAEROBIC  AER 4CC ANA 1CC   Culture NO GROWTH 5 DAYS  Final   Report Status 09/07/2015 FINAL  Final  Culture, blood (Routine X 2) w Reflex to ID Panel     Status: None   Collection Time: 09/02/15  3:20 PM  Result Value Ref Range Status   Specimen Description BLOOD RIGHT ARM  Final   Special Requests BOTTLES DRAWN AEROBIC AND ANAEROBIC  8CC  Final   Culture NO GROWTH 5 DAYS  Final   Report Status 09/07/2015 FINAL  Final  Culture, respiratory (NON-Expectorated)     Status: None    Collection Time: 09/02/15  4:30 PM  Result Value Ref Range  Status   Specimen Description TRACHEAL ASPIRATE  Final   Special Requests NONE  Final   Gram Stain   Final    MODERATE WBC SEEN FEW GRAM POSITIVE COCCI IN PAIRS    Culture LIGHT GROWTH STENOTROPHOMONAS MALTOPHILIA  Final   Report Status 09/06/2015 FINAL  Final   Organism ID, Bacteria STENOTROPHOMONAS MALTOPHILIA  Final      Susceptibility   Stenotrophomonas maltophilia - MIC*    LEVOFLOXACIN <=0.12 SENSITIVE Sensitive     TRIMETH/SULFA <=20 SENSITIVE Sensitive     * LIGHT GROWTH STENOTROPHOMONAS MALTOPHILIA  C difficile quick scan w PCR reflex     Status: None   Collection Time: 09/02/15  4:56 PM  Result Value Ref Range Status   C Diff antigen NEGATIVE NEGATIVE Final   C Diff toxin NEGATIVE NEGATIVE Final   C Diff interpretation Negative for C. difficile  Final    Coagulation Studies: No results for input(s): LABPROT, INR in the last 72 hours.  Urinalysis: No results for input(s): COLORURINE, LABSPEC, PHURINE, GLUCOSEU, HGBUR, BILIRUBINUR, KETONESUR, PROTEINUR, UROBILINOGEN, NITRITE, LEUKOCYTESUR in the last 72 hours.  Invalid input(s): APPERANCEUR    Imaging: No results found.   Medications:       Assessment/ Plan:  Mr. John Hoover is a 50 y.o. white male with no specific past medical history, who was admitted to [facility] on 08/27/2015 + Ibuprofen use prior to admission (600 mg TID x 2 days). [facility]  1. Acute Renal Failure,   Likely secondary to ATN 2. Metabolic Acidosis 3. Rhabdomyolysis with hyperphosphatemia 4. Acute respiratory failure, Stenotrophomonas PNA, extubated 09/10/15. 5. Influenza A positive 6. Hypoalbuminemia 7. Anasarca/Generalized edema 8. Anemia and Thrombocytopenia   9. Hyperkalemia.  Plan: Urine output appears to have increased.  The patient has generalized anasarca which is improving over the past several days. However overall he is still volume  overloaded. Monitor electrolytes and volume status along with urine output We will hold further dialysis for now If he needs further HD, will need a new dialysis cathter. Serum creatinine improved today which is encouraging   LOS:  John Hoover 4/19/20175:46 PM

## 2015-09-20 ENCOUNTER — Other Ambulatory Visit (HOSPITAL_COMMUNITY): Payer: Self-pay

## 2015-09-20 DIAGNOSIS — J9600 Acute respiratory failure, unspecified whether with hypoxia or hypercapnia: Secondary | ICD-10-CM | POA: Diagnosis not present

## 2015-09-20 DIAGNOSIS — Z9981 Dependence on supplemental oxygen: Secondary | ICD-10-CM | POA: Diagnosis not present

## 2015-09-20 DIAGNOSIS — D649 Anemia, unspecified: Secondary | ICD-10-CM | POA: Diagnosis not present

## 2015-09-20 DIAGNOSIS — N179 Acute kidney failure, unspecified: Secondary | ICD-10-CM | POA: Diagnosis not present

## 2015-09-20 LAB — URINE MICROSCOPIC-ADD ON

## 2015-09-20 LAB — URINALYSIS, ROUTINE W REFLEX MICROSCOPIC
BILIRUBIN URINE: NEGATIVE
Glucose, UA: NEGATIVE mg/dL
KETONES UR: NEGATIVE mg/dL
Leukocytes, UA: NEGATIVE
NITRITE: NEGATIVE
Protein, ur: 30 mg/dL — AB
Specific Gravity, Urine: 1.012 (ref 1.005–1.030)
pH: 7 (ref 5.0–8.0)

## 2015-09-20 LAB — BASIC METABOLIC PANEL
ANION GAP: 13 (ref 5–15)
BUN: 62 mg/dL — ABNORMAL HIGH (ref 6–20)
CO2: 23 mmol/L (ref 22–32)
Calcium: 8 mg/dL — ABNORMAL LOW (ref 8.9–10.3)
Chloride: 107 mmol/L (ref 101–111)
Creatinine, Ser: 6.01 mg/dL — ABNORMAL HIGH (ref 0.61–1.24)
GFR calc Af Amer: 11 mL/min — ABNORMAL LOW (ref >60)
GFR, EST NON AFRICAN AMERICAN: 10 mL/min — AB (ref >60)
GLUCOSE: 102 mg/dL — AB (ref 65–99)
POTASSIUM: 3.3 mmol/L — AB (ref 3.5–5.1)
Sodium: 143 mmol/L (ref 135–145)

## 2015-09-21 ENCOUNTER — Encounter: Payer: Self-pay | Admitting: *Deleted

## 2015-09-21 DIAGNOSIS — J9600 Acute respiratory failure, unspecified whether with hypoxia or hypercapnia: Secondary | ICD-10-CM | POA: Diagnosis not present

## 2015-09-21 DIAGNOSIS — R601 Generalized edema: Secondary | ICD-10-CM | POA: Diagnosis not present

## 2015-09-21 DIAGNOSIS — N179 Acute kidney failure, unspecified: Secondary | ICD-10-CM | POA: Diagnosis not present

## 2015-09-21 DIAGNOSIS — D649 Anemia, unspecified: Secondary | ICD-10-CM | POA: Diagnosis not present

## 2015-09-21 DIAGNOSIS — Z9981 Dependence on supplemental oxygen: Secondary | ICD-10-CM | POA: Diagnosis not present

## 2015-09-21 LAB — RENAL FUNCTION PANEL
ALBUMIN: 2.1 g/dL — AB (ref 3.5–5.0)
ANION GAP: 12 (ref 5–15)
BUN: 53 mg/dL — AB (ref 6–20)
CHLORIDE: 107 mmol/L (ref 101–111)
CO2: 23 mmol/L (ref 22–32)
Calcium: 8 mg/dL — ABNORMAL LOW (ref 8.9–10.3)
Creatinine, Ser: 4.78 mg/dL — ABNORMAL HIGH (ref 0.61–1.24)
GFR calc Af Amer: 15 mL/min — ABNORMAL LOW (ref >60)
GFR, EST NON AFRICAN AMERICAN: 13 mL/min — AB (ref >60)
Glucose, Bld: 100 mg/dL — ABNORMAL HIGH (ref 65–99)
PHOSPHORUS: 4.7 mg/dL — AB (ref 2.5–4.6)
POTASSIUM: 3.2 mmol/L — AB (ref 3.5–5.1)
Sodium: 142 mmol/L (ref 135–145)

## 2015-09-21 LAB — CBC WITH DIFFERENTIAL/PLATELET
BASOS ABS: 0 10*3/uL (ref 0.0–0.1)
BASOS PCT: 1 %
Eosinophils Absolute: 0.2 10*3/uL (ref 0.0–0.7)
Eosinophils Relative: 4 %
HEMATOCRIT: 23.7 % — AB (ref 39.0–52.0)
HEMOGLOBIN: 7.3 g/dL — AB (ref 13.0–17.0)
LYMPHS PCT: 28 %
Lymphs Abs: 1.4 10*3/uL (ref 0.7–4.0)
MCH: 29 pg (ref 26.0–34.0)
MCHC: 30.8 g/dL (ref 30.0–36.0)
MCV: 94 fL (ref 78.0–100.0)
MONO ABS: 0.5 10*3/uL (ref 0.1–1.0)
MONOS PCT: 9 %
NEUTROS ABS: 2.8 10*3/uL (ref 1.7–7.7)
NEUTROS PCT: 58 %
Platelets: 171 10*3/uL (ref 150–400)
RBC: 2.52 MIL/uL — ABNORMAL LOW (ref 4.22–5.81)
RDW: 16.9 % — AB (ref 11.5–15.5)
WBC: 4.9 10*3/uL (ref 4.0–10.5)

## 2015-09-21 LAB — BASIC METABOLIC PANEL
ANION GAP: 13 (ref 5–15)
BUN: 53 mg/dL — ABNORMAL HIGH (ref 6–20)
CALCIUM: 8 mg/dL — AB (ref 8.9–10.3)
CO2: 23 mmol/L (ref 22–32)
Chloride: 106 mmol/L (ref 101–111)
Creatinine, Ser: 4.86 mg/dL — ABNORMAL HIGH (ref 0.61–1.24)
GFR, EST AFRICAN AMERICAN: 15 mL/min — AB (ref >60)
GFR, EST NON AFRICAN AMERICAN: 13 mL/min — AB (ref >60)
GLUCOSE: 99 mg/dL (ref 65–99)
POTASSIUM: 3.2 mmol/L — AB (ref 3.5–5.1)
Sodium: 142 mmol/L (ref 135–145)

## 2015-09-21 LAB — URINE CULTURE: CULTURE: NO GROWTH

## 2015-09-21 LAB — MAGNESIUM: MAGNESIUM: 1.7 mg/dL (ref 1.7–2.4)

## 2015-09-21 LAB — BRAIN NATRIURETIC PEPTIDE: B NATRIURETIC PEPTIDE 5: 229.8 pg/mL — AB (ref 0.0–100.0)

## 2015-09-21 LAB — CK: CK TOTAL: 168 U/L (ref 49–397)

## 2015-09-21 NOTE — Progress Notes (Signed)
[facility]  ROUNDING NOTE   Subjective:  [facility] Doing better. No shortness of breath Edema- appears to be improved S Creatinine is starting to improve spontaneously 4.86 today, Urine output 1800 cc Working with physical therapy. Reports low grade fever  Dialysis catheter was removed on 4/19    Objective:  Vital signs in last 24 hours:   Vitals Temperature 99.5, pulse 96, respirations 21, blood pressure 164/94  Weight change:  There were no vitals filed for this visit.  Intake/Output:     Intake/Output this shift:     Physical Exam: General: No acute distress  H/ENT [place]/AT  hearing intact  Eyes: EOMI  Neck: trachea midline,    Lungs:  Coarse at bases, with exp wheezing, [place] 1 L  Heart: S1S2 no rubs  Abdomen:  Soft, nontender  Extremities: + dependent edema  Neurologic: Awake, alert, following commands  Skin: Mottling right hand dorsum of fingers       Basic Metabolic Panel:  Recent Labs Lab 09/17/15 0630 09/18/15 0630 09/19/15 0640 09/20/15 0135 09/21/15 0648  NA 140 139 141 143 142  142  K 3.9 3.6 3.5 3.3* 3.2*  3.2*  CL 102 101 104 107 106  107  CO2 27 25 23 23 23  23   GLUCOSE 97 95 92 102* 99  100*  BUN 58* 63* 63* 62* 53*  53*  CREATININE 7.37* 7.38* 6.57* 6.01* 4.86*  4.78*  CALCIUM 7.8* 7.8* 8.1* 8.0* 8.0*  8.0*  MG  --  2.0 2.0  --  1.7  PHOS 5.9* 5.9*  6.0* 5.7*  --  4.7*    Liver Function Tests:  Recent Labs Lab 09/18/15 0630 09/19/15 0640 09/21/15 0648  ALBUMIN 2.2* 2.3* 2.1*   No results for input(s): LIPASE, AMYLASE in the last 168 hours. No results for input(s): AMMONIA in the last 168 hours.  CBC:  Recent Labs Lab 09/16/15 1030 09/17/15 0630 09/18/15 0630 09/19/15 0640 09/21/15 0648  WBC 5.4 5.3 4.9 5.1 4.9  NEUTROABS 3.7 3.4 2.7 3.1 2.8  HGB 8.1* 7.5* 7.6* 8.0* 7.3*  HCT 26.4* 23.8* 24.3* 25.1* 23.7*  MCV 94.3 94.4 93.8 94.7 94.0  PLT 144* 123*  141* 150 171    Cardiac Enzymes:  Recent Labs Lab 09/15/15 0700 09/21/15 0648  CKTOTAL 517* 168    BNP: Invalid input(s): POCBNP  CBG: No results for input(s): GLUCAP in the last 168 hours.  Microbiology: Results for orders placed or performed during the hospital encounter of 09/13/15  Culture, blood (routine x 2)     Status: None (Preliminary result)   Collection Time: 09/20/15  1:35 AM  Result Value Ref Range Status   Specimen Description BLOOD RIGHT ANTECUBITAL  Final   Special Requests   Final    BOTTLES DRAWN AEROBIC AND ANAEROBIC 10CC BLUE 5CC PURPLE   Culture NO GROWTH 1 DAY  Final   Report Status PENDING  Incomplete  Culture, blood (routine x 2)     Status: None (Preliminary result)   Collection Time: 09/20/15  1:40 AM  Result Value Ref Range Status   Specimen Description BLOOD RIGHT HAND  Final   Special Requests BOTTLES DRAWN AEROBIC AND ANAEROBIC 5CC  Final   Culture NO GROWTH 1 DAY  Final   Report Status PENDING  Incomplete  Culture, Urine     Status: None (Preliminary result)   Collection Time: 09/20/15  2:33 PM  Result Value Ref Range Status  Specimen Description URINE, RANDOM  Final   Special Requests NONE  Final   Culture NO GROWTH < 24 HOURS  Final   Report Status PENDING  Incomplete    Coagulation Studies: No results for input(s): LABPROT, INR in the last 72 hours.  Urinalysis:  Recent Labs  09/20/15 1434  COLORURINE YELLOW  LABSPEC 1.012  PHURINE 7.0  GLUCOSEU NEGATIVE  HGBUR SMALL*  BILIRUBINUR NEGATIVE  KETONESUR NEGATIVE  PROTEINUR 30*  NITRITE NEGATIVE  LEUKOCYTESUR NEGATIVE      Imaging: Dg Chest Port 1 View  09/20/2015  CLINICAL DATA:  Low-grade fever, acute renal failure, acute respiratory failure, hypovolemic shock. EXAM: PORTABLE CHEST 1 VIEW COMPARISON:  Portable chest x-ray of September 14, 2015 FINDINGS: The lungs are reasonably well inflated. The pulmonary interstitial markings remain increased. The cardiac silhouette  remains enlarged. The pulmonary vascularity remains engorged and indistinct. The retrocardiac region on the left is less dense and there is better visualization of the left hemidiaphragm. Interval full removal of left internal jugular and subclavian venous catheters. IMPRESSION: CHF with mild pulmonary interstitial edema. Slight improvement in left lower lobe atelectasis. Electronically Signed   By: David  [place] M.D.   On: 09/20/2015 07:25     Medications:       Assessment/ Plan:  John Hoover is a 50 y.o. white male with no specific past medical history, who was admitted to [facility] on 08/27/2015 + Ibuprofen use prior to admission (600 mg TID x 2 days). [facility]  1. Acute Renal Failure,   Likely secondary to ATN 2. Metabolic Acidosis 3. Rhabdomyolysis with hyperphosphatemia 4. Acute respiratory failure, Stenotrophomonas PNA, extubated 09/10/15. 5. Influenza A positive 6. Hypoalbuminemia 7. Anasarca/Generalized edema 8. Anemia and Thrombocytopenia   9. Hyperkalemia.  Plan: Urine output appears to have increased.  The patient has generalized anasarca which is improving over the past several days.  S Creatinine has started to improve significantly Monitor electrolytes and volume status along with urine output No further need for dialysis is anticipated   LOS:  Tamella Tuccillo 4/21/20174:20 PM

## 2015-09-21 NOTE — PMR Pre-admission (Signed)
[facility] PMR Admission Coordinator Pre-Admission Assessment  Patient: John Hoover is an 50 y.o., male MRN: [id number] DOB: Apr 18, 1966 Height:   Weight:    Insurance Information HMO:     PPO: yes     PCP:      IPA:      80/20:      OTHER:  PRIMARY: [employer] [place]      Policy#: [id number]      Subscriber: pt CM Name: Glenard Haring      Phone#: [phone]     Fax#: [phone] Pre-Cert#: [id number]      Employer:  Approved for 14 days when update is due. 4/24 until 5/1 Benefits:  Phone #: [phone]     Name: 09/21/15 Eff. Date: 06/02/14     Deduct: $500      Out of Pocket Max: $3000      Life Max: none CIR: 80%   30 days   SNF: 80% 120 days Outpatient: $25 copay per visit     Co-Pay: 30 visits combined Home Health: 80%      Co-Pay: no visit limit DME: 80%     Co-Pay: 20% Providers: in network  SECONDARY: none       Medicaid Application Date:       Case Manager:  Disability Application Date:       Case Worker:   Emergency Contact Information Contact Information    Name Relation Home Work Mobile   [place] Spouse [phone]  [place]   [phone]   Kerwin, Augustus   [phone]      Current Medical History  Patient Admitting Diagnosis: Debility, acute renal failure  History of Present Illness: 50 yo male admitted to [facility] on 08/27/2015 with several days of malaise and profound myalgias. Influenza PCR positive. Hemoconcentrated with AKI and elevated CPK. On 3/28 developed progressive AMS and found to be profoundly metabolic acidodic. Transferred to the ICU and PCCM consulted for rhabdo, and shock. Pt intubated and HD cath placed for CRRT, bicarb IV and antibiotics. Patient did require transfusions of platelets and RBCS. Initially had lots of agitation and required propofol. Eventually extubated 4/10 but lethargic and profoundly deconditioned. Admitted to [facility] on 09/13/2015.  At [facility]  Nephrology consulted to manage renal issues with renal  failure gradually resolving with creat 4/24 of 2.45. Pt with low grade fevers with all lines removed. Infectious workup and felt likely left lower lobe pneumonia and began cefepime IV.  Bilateral skin necrosis to hands noted and felt to vasopressor infusion. Hgb 7.5 4/24.  Patient's medical record from [facility] and [facility] has been reviewed by the rehabilitation admission coordinator and physician as well as bedside assessment with pt and wife present for assessment.  NIH Stroke scale: NA Glascow Coma Scale: NA  Past Medical History  Past Medical History  Diagnosis Date  . Meniere's disease     Family History   family history includes Hypertension in his father and mother. There is no history of Colon cancer or Prostate cancer.  Prior Rehab/Hospitalizations Has the patient had major surgery during 100 days prior to admission? No  Patient has never been hospitalized except for tonsillectomy as a 45 yo child.  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 Very independent and works a lot.   Prior Functional Level Self Care: Did the patient need help bathing, dressing, using the toilet or eating?  Independent  Indoor Mobility: Did the patient need assistance with walking from room to room (with or without device)? Independent  Stairs: Did the patient need assistance with internal or external stairs (with or without device)? Independent  Functional Cognition: Did the patient need help planning regular tasks such as shopping or remembering to take medications?  Ingalls / Equipment none  Prior Device Use: Indicate devices/aids used by the patient prior to current illness, exacerbation or injury? None of the above   Prior Functional Level Current Functional Level  Bed Mobility  Independent  Mod assist (min assist rolling, mod assist for supine to edge of bed)   Transfers  Independent  Mod assist   Mobility - Walk/Wheelchair  Independent  Mod assist (mod assist RW 24 feet)   Upper Body Dressing  Independent  Mod assist (mod to max assist)   Lower Body Dressing  Independent  Total assist   Grooming  Independent  Max assist   Eating/Drinking  Independent  Min assist   Toilet Transfer  Independent  Mod assist   Bladder Continence   continent  continent   Bowel Management  continent  continent   Stair Climbing      (not attempted)   Communication  independent  independent; easily frustrated   Memory  intact  intact   Cooking/Meal Prep  independent      Housework  independent    Money Management  independent    Driving         Special needs/care consideration special : Bed overlay mattress Skin 4/13 [facility] RN skin assessment  #1 r heel 2 x 1.5 DTI       #2 l heel 3.1 x 5 DTI       #3 IAD B buttocks/perineum        #4 l 1-5 fingers       #5 r 1- 5 fingers       #6 r foot       #7 r plantar foot       #8 r dorsal foot       #9 l anterior ankle       #10 l dorsal 1-5 toes dorsal and plantar     #1 and #2 purple nonblanching intact area with drainage noted. #3 tender reddened area with slight peeling. No itching. #4-#10 necrotic areas noted with h/ovasopressors. Currently 2t pulses x 4. Denies pain. Plan to offload at all times. For #1 and #2. #3 barrier cream to protect and repel. Encouraged to offload. #4-#10 monitor and report changes. Apply xeroform b toes to provide moisture. Prevention protocol initiated.             Bowel mgmt: continent Bladder  mgmt:continent Lymphadema wraping performed daily by OT Pt has never been hospitalized before except for as a 78 yo child for tonsilectomy Very poor appetite now, but very picky eater pta  Previous Home Environment Living Arrangements: Spouse/significant other, Children (wife and teenage daughter, 58 yo son also at home for the su)  Lives With: Spouse, Son, Daughter Available Help at Discharge: Family, Available 24 hours/day (son not working and able. Wife has taken fmla butplans to  re) Type of Home: House Home Layout: Two level, Able to live on main level with bedroom/bathroom Alternate Level Stairs-Rails: Right Alternate Level Stairs-Number of Steps: flight  Home Access: Stairs to enter Entrance Stairs-Rails: None Entrance Stairs-Number of Steps: 3 Bathroom Shower/Tub: Multimedia programmer: Standard Bathroom Accessibility: Yes How Accessible: Accessible via walker Home Care Services: No Wife works as Secretary/administrator in school system and plans to return to work. 75 yo son, home for the summer now and available to help when wife works  Discharge Living Setting Plans for Discharge Living Setting: Patient's home, Lives with (comment) (wife and two children) Type of Home at Discharge: House Discharge Home Layout: Two level, Able to live on main level with bedroom/bathroom Alternate Level Stairs-Rails: Right Alternate Level Stairs-Number of Steps: flight Discharge Home Access: Stairs to enter Entrance Stairs-Rails: None Entrance Stairs-Number of Steps: 3 Discharge Bathroom Shower/Tub: Horticulturist, commercial: Standard Discharge Bathroom Accessibility: Yes How Accessible: Accessible via walker Does the patient have any problems obtaining your medications?: No  Social/Family/Support Systems Patient Roles: Spouse, Parent, Other (Comment) (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 38 yo son to assist Caregiver Availability: 24/7 Discharge Plan Discussed with Primary Caregiver: Yes Is Caregiver In Agreement with Plan?: Yes Does Caregiver/Family have Issues with Lodging/Transportation while Pt is in Rehab?: No  Goals/Additional Needs Patient/Family Goal for Rehab: supervision PT, supervision to min OT, supervision SLP Expected length of stay: ELOS 10- 14 days Pt/Family Agrees to Admission and willing to participate: Yes Program Orientation Provided & Reviewed with Pt/Caregiver Including Roles  & Responsibilities: Yes  Patient Condition: I have reviwed the medical records from [place] spoke with SW and met with pt and his wife at bedside for assessment. Patient needs ongoing PT and OT and will benefit from a coordinated team approach with rehabilitation MD and nursing care. He will receive three hours of therpay a day. He is currently mod to max assist with adls and mod assist with short distances of ambulation. We will admit to inpatient acute rehabilitation today.   Preadmission Screen Completed By:  Cleatrice Burke, 09/24/2015 10:41 AM ______________________________________________________________________   Discussed status with Dr. Posey Pronto  on 09/24/2015 at 23 and received telephone approval for admission today.  Admission Coordinator:  Cleatrice Burke, time 3086 Date 09/24/2015   Assessment/Plan: Diagnosis:Debility, acute renal failure  1. Does the need for close, 24 hr/day  Medical supervision in concert with the patient's rehab needs make it unreasonable for this patient to be served in a less intensive setting? Yes  2. Co-Morbidities requiring supervision/potential complications: AKI (avoid nephrotoxic meds), AMS (appears to have resolved, continue to  monitor), low grade fevers (continue to monitor), lower lobe pneumonia (continue Abx), Bilateral skin necrosis to hands (continue to monitor her wounds)  3. Due to safety, skin/wound care, disease management, medication administration, pain management and patient education, does the patient require 24 hr/day rehab nursing? Yes 4. Does the patient require coordinated care of a physician, rehab nurse, PT (1-2 hrs/day, 5 days/week) and OT (1-2 hrs/day, 5 days/week) to address physical and functional deficits in the context of the above medical diagnosis(es)? Yes Addressing deficits in the following areas: balance, endurance, locomotion, strength, transferring, bathing, dressing, toileting and psychosocial support 5. Can the patient  actively participate in an intensive therapy program of at least 3 hrs of therapy 5 days a week? Yes 6. The potential for patient to make measurable gains while on inpatient rehab is excellent 7. Anticipated functional outcomes upon discharge from inpatients are: min assist PT, min assist OT, n/a SLP 8. Estimated rehab length of stay to reach the above functional goals is: 16-20 days. 9. Does the patient have adequate social supports to accommodate these discharge functional goals? Yes 10. Anticipated D/C setting: Home 11. Anticipated post D/C treatments: HH therapy and Home excercise program 12. Overall Rehab/Functional Prognosis: excellent   RECOMMENDATIONS: This patient's condition is appropriate for continued rehabilitative care in the following setting: CIR Patient has agreed to participate in recommended program. Yes Note that insurance prior authorization may be required for reimbursement for recommended care.  Comment:  Cleatrice Burke 09/24/2015  Delice Lesch, MD 09/24/15

## 2015-09-22 DIAGNOSIS — N179 Acute kidney failure, unspecified: Secondary | ICD-10-CM | POA: Diagnosis not present

## 2015-09-22 DIAGNOSIS — Z9981 Dependence on supplemental oxygen: Secondary | ICD-10-CM | POA: Diagnosis not present

## 2015-09-22 DIAGNOSIS — J9600 Acute respiratory failure, unspecified whether with hypoxia or hypercapnia: Secondary | ICD-10-CM | POA: Diagnosis not present

## 2015-09-22 DIAGNOSIS — D649 Anemia, unspecified: Secondary | ICD-10-CM | POA: Diagnosis not present

## 2015-09-22 LAB — RENAL FUNCTION PANEL
Albumin: 2 g/dL — ABNORMAL LOW (ref 3.5–5.0)
Anion gap: 12 (ref 5–15)
BUN: 49 mg/dL — AB (ref 6–20)
CALCIUM: 7.9 mg/dL — AB (ref 8.9–10.3)
CHLORIDE: 108 mmol/L (ref 101–111)
CO2: 22 mmol/L (ref 22–32)
CREATININE: 3.94 mg/dL — AB (ref 0.61–1.24)
GFR, EST AFRICAN AMERICAN: 19 mL/min — AB (ref >60)
GFR, EST NON AFRICAN AMERICAN: 16 mL/min — AB (ref >60)
Glucose, Bld: 99 mg/dL (ref 65–99)
Phosphorus: 4.2 mg/dL (ref 2.5–4.6)
Potassium: 3.2 mmol/L — ABNORMAL LOW (ref 3.5–5.1)
SODIUM: 142 mmol/L (ref 135–145)

## 2015-09-22 LAB — MAGNESIUM: MAGNESIUM: 1.7 mg/dL (ref 1.7–2.4)

## 2015-09-23 DIAGNOSIS — N179 Acute kidney failure, unspecified: Secondary | ICD-10-CM | POA: Diagnosis not present

## 2015-09-23 DIAGNOSIS — D649 Anemia, unspecified: Secondary | ICD-10-CM | POA: Diagnosis not present

## 2015-09-23 DIAGNOSIS — R531 Weakness: Secondary | ICD-10-CM | POA: Diagnosis not present

## 2015-09-23 DIAGNOSIS — I10 Essential (primary) hypertension: Secondary | ICD-10-CM | POA: Diagnosis not present

## 2015-09-23 LAB — RENAL FUNCTION PANEL
Albumin: 1.9 g/dL — ABNORMAL LOW (ref 3.5–5.0)
Anion gap: 10 (ref 5–15)
BUN: 44 mg/dL — AB (ref 6–20)
CHLORIDE: 110 mmol/L (ref 101–111)
CO2: 23 mmol/L (ref 22–32)
CREATININE: 2.98 mg/dL — AB (ref 0.61–1.24)
Calcium: 8.2 mg/dL — ABNORMAL LOW (ref 8.9–10.3)
GFR calc Af Amer: 26 mL/min — ABNORMAL LOW (ref >60)
GFR, EST NON AFRICAN AMERICAN: 23 mL/min — AB (ref >60)
GLUCOSE: 94 mg/dL (ref 65–99)
POTASSIUM: 3.5 mmol/L (ref 3.5–5.1)
Phosphorus: 3.9 mg/dL (ref 2.5–4.6)
Sodium: 143 mmol/L (ref 135–145)

## 2015-09-23 LAB — CBC WITH DIFFERENTIAL/PLATELET
Basophils Absolute: 0.1 10*3/uL (ref 0.0–0.1)
Basophils Relative: 1 %
EOS PCT: 3 %
Eosinophils Absolute: 0.2 10*3/uL (ref 0.0–0.7)
HEMATOCRIT: 24 % — AB (ref 39.0–52.0)
Hemoglobin: 7.4 g/dL — ABNORMAL LOW (ref 13.0–17.0)
LYMPHS ABS: 1.9 10*3/uL (ref 0.7–4.0)
Lymphocytes Relative: 35 %
MCH: 29.2 pg (ref 26.0–34.0)
MCHC: 30.8 g/dL (ref 30.0–36.0)
MCV: 94.9 fL (ref 78.0–100.0)
MONO ABS: 0.6 10*3/uL (ref 0.1–1.0)
Monocytes Relative: 11 %
NEUTROS ABS: 2.5 10*3/uL (ref 1.7–7.7)
Neutrophils Relative %: 50 %
PLATELETS: 195 10*3/uL (ref 150–400)
RBC: 2.53 MIL/uL — AB (ref 4.22–5.81)
RDW: 16.7 % — AB (ref 11.5–15.5)
WBC: 5.3 10*3/uL (ref 4.0–10.5)

## 2015-09-23 LAB — MAGNESIUM: MAGNESIUM: 1.6 mg/dL — AB (ref 1.7–2.4)

## 2015-09-24 ENCOUNTER — Inpatient Hospital Stay (HOSPITAL_COMMUNITY)
Admission: RE | Admit: 2015-09-24 | Discharge: 2015-10-05 | DRG: 073 | Disposition: A | Discharge status: Home / Self Care | Payer: BLUE CROSS/BLUE SHIELD | Source: Intra-hospital | Attending: Physical Medicine & Rehabilitation | Admitting: Physical Medicine & Rehabilitation

## 2015-09-24 DIAGNOSIS — G6281 Critical illness polyneuropathy: Secondary | ICD-10-CM | POA: Diagnosis not present

## 2015-09-24 DIAGNOSIS — R601 Generalized edema: Secondary | ICD-10-CM | POA: Diagnosis not present

## 2015-09-24 DIAGNOSIS — E8809 Other disorders of plasma-protein metabolism, not elsewhere classified: Secondary | ICD-10-CM | POA: Diagnosis present

## 2015-09-24 DIAGNOSIS — E872 Acidosis: Secondary | ICD-10-CM | POA: Diagnosis present

## 2015-09-24 DIAGNOSIS — D696 Thrombocytopenia, unspecified: Secondary | ICD-10-CM | POA: Diagnosis present

## 2015-09-24 DIAGNOSIS — N179 Acute kidney failure, unspecified: Secondary | ICD-10-CM | POA: Diagnosis not present

## 2015-09-24 DIAGNOSIS — M792 Neuralgia and neuritis, unspecified: Secondary | ICD-10-CM | POA: Diagnosis not present

## 2015-09-24 DIAGNOSIS — E876 Hypokalemia: Secondary | ICD-10-CM | POA: Insufficient documentation

## 2015-09-24 DIAGNOSIS — R269 Unspecified abnormalities of gait and mobility: Secondary | ICD-10-CM | POA: Diagnosis not present

## 2015-09-24 DIAGNOSIS — F419 Anxiety disorder, unspecified: Secondary | ICD-10-CM | POA: Diagnosis present

## 2015-09-24 DIAGNOSIS — N170 Acute kidney failure with tubular necrosis: Secondary | ICD-10-CM | POA: Diagnosis not present

## 2015-09-24 DIAGNOSIS — D638 Anemia in other chronic diseases classified elsewhere: Secondary | ICD-10-CM | POA: Diagnosis not present

## 2015-09-24 DIAGNOSIS — R600 Localized edema: Secondary | ICD-10-CM | POA: Insufficient documentation

## 2015-09-24 DIAGNOSIS — R609 Edema, unspecified: Secondary | ICD-10-CM | POA: Insufficient documentation

## 2015-09-24 DIAGNOSIS — B379 Candidiasis, unspecified: Secondary | ICD-10-CM | POA: Diagnosis present

## 2015-09-24 DIAGNOSIS — E875 Hyperkalemia: Secondary | ICD-10-CM | POA: Diagnosis present

## 2015-09-24 DIAGNOSIS — R52 Pain, unspecified: Secondary | ICD-10-CM | POA: Insufficient documentation

## 2015-09-24 DIAGNOSIS — M6282 Rhabdomyolysis: Secondary | ICD-10-CM | POA: Diagnosis not present

## 2015-09-24 DIAGNOSIS — F411 Generalized anxiety disorder: Secondary | ICD-10-CM | POA: Diagnosis not present

## 2015-09-24 DIAGNOSIS — R531 Weakness: Secondary | ICD-10-CM | POA: Insufficient documentation

## 2015-09-24 DIAGNOSIS — H8109 Meniere's disease, unspecified ear: Secondary | ICD-10-CM | POA: Diagnosis present

## 2015-09-24 DIAGNOSIS — R5381 Other malaise: Secondary | ICD-10-CM

## 2015-09-24 DIAGNOSIS — D649 Anemia, unspecified: Secondary | ICD-10-CM | POA: Diagnosis not present

## 2015-09-24 DIAGNOSIS — D6489 Other specified anemias: Secondary | ICD-10-CM | POA: Diagnosis present

## 2015-09-24 DIAGNOSIS — I10 Essential (primary) hypertension: Secondary | ICD-10-CM | POA: Diagnosis not present

## 2015-09-24 LAB — CBC WITH DIFFERENTIAL/PLATELET
BASOS ABS: 0 10*3/uL (ref 0.0–0.1)
BASOS PCT: 1 %
EOS PCT: 3 %
Eosinophils Absolute: 0.2 10*3/uL (ref 0.0–0.7)
HCT: 24.1 % — ABNORMAL LOW (ref 39.0–52.0)
Hemoglobin: 7.5 g/dL — ABNORMAL LOW (ref 13.0–17.0)
Lymphocytes Relative: 44 %
Lymphs Abs: 2.4 10*3/uL (ref 0.7–4.0)
MCH: 29.8 pg (ref 26.0–34.0)
MCHC: 31.1 g/dL (ref 30.0–36.0)
MCV: 95.6 fL (ref 78.0–100.0)
MONO ABS: 0.6 10*3/uL (ref 0.1–1.0)
Monocytes Relative: 11 %
Neutro Abs: 2.2 10*3/uL (ref 1.7–7.7)
Neutrophils Relative %: 41 %
Platelets: 202 10*3/uL (ref 150–400)
RBC: 2.52 MIL/uL — ABNORMAL LOW (ref 4.22–5.81)
RDW: 16.4 % — AB (ref 11.5–15.5)
WBC: 5.4 10*3/uL (ref 4.0–10.5)

## 2015-09-24 LAB — RENAL FUNCTION PANEL
ANION GAP: 12 (ref 5–15)
Albumin: 2.1 g/dL — ABNORMAL LOW (ref 3.5–5.0)
BUN: 39 mg/dL — ABNORMAL HIGH (ref 6–20)
CHLORIDE: 107 mmol/L (ref 101–111)
CO2: 22 mmol/L (ref 22–32)
Calcium: 8.3 mg/dL — ABNORMAL LOW (ref 8.9–10.3)
Creatinine, Ser: 2.45 mg/dL — ABNORMAL HIGH (ref 0.61–1.24)
GFR calc non Af Amer: 29 mL/min — ABNORMAL LOW (ref >60)
GFR, EST AFRICAN AMERICAN: 33 mL/min — AB (ref >60)
Glucose, Bld: 91 mg/dL (ref 65–99)
Phosphorus: 3.8 mg/dL (ref 2.5–4.6)
Potassium: 3.4 mmol/L — ABNORMAL LOW (ref 3.5–5.1)
Sodium: 141 mmol/L (ref 135–145)

## 2015-09-24 LAB — MAGNESIUM: Magnesium: 1.7 mg/dL (ref 1.7–2.4)

## 2015-09-24 MED ORDER — FLUCONAZOLE 100 MG PO TABS
100.0000 mg | ORAL_TABLET | Freq: Once | ORAL | Status: AC
  Administered 2015-09-24: 100 mg via ORAL
  Filled 2015-09-24: qty 1

## 2015-09-24 MED ORDER — TRAMADOL HCL 50 MG PO TABS
50.0000 mg | ORAL_TABLET | Freq: Four times a day (QID) | ORAL | Status: DC | PRN
  Administered 2015-09-25 – 2015-09-26 (×2): 50 mg via ORAL
  Filled 2015-09-24 (×3): qty 1

## 2015-09-24 MED ORDER — METOPROLOL TARTRATE 12.5 MG HALF TABLET
12.5000 mg | ORAL_TABLET | Freq: Two times a day (BID) | ORAL | Status: DC
  Administered 2015-09-24 – 2015-10-05 (×22): 12.5 mg via ORAL
  Filled 2015-09-24 (×22): qty 1

## 2015-09-24 MED ORDER — BISACODYL 10 MG RE SUPP
10.0000 mg | Freq: Every day | RECTAL | Status: DC | PRN
  Administered 2015-09-26: 10 mg via RECTAL
  Filled 2015-09-24 (×2): qty 1

## 2015-09-24 MED ORDER — DIPHENHYDRAMINE HCL 12.5 MG/5ML PO ELIX: 12.5000 mg | ORAL_SOLUTION | Freq: Four times a day (QID) | ORAL | Status: DC | PRN

## 2015-09-24 MED ORDER — PREGABALIN 25 MG PO CAPS
25.0000 mg | ORAL_CAPSULE | Freq: Every day | ORAL | Status: DC
  Administered 2015-09-25 – 2015-09-27 (×3): 25 mg via ORAL
  Filled 2015-09-24 (×3): qty 1

## 2015-09-24 MED ORDER — GABAPENTIN 100 MG PO CAPS
100.0000 mg | ORAL_CAPSULE | Freq: Two times a day (BID) | ORAL | Status: DC
  Administered 2015-09-24 – 2015-09-27 (×6): 100 mg via ORAL
  Filled 2015-09-24 (×6): qty 1

## 2015-09-24 MED ORDER — TRAZODONE HCL 50 MG PO TABS: 25.0000 mg | ORAL_TABLET | Freq: Every evening | ORAL | Status: DC | PRN

## 2015-09-24 MED ORDER — FLEET ENEMA 7-19 GM/118ML RE ENEM: 1.0000 | ENEMA | Freq: Once | RECTAL | Status: DC | PRN

## 2015-09-24 MED ORDER — MAGNESIUM OXIDE 400 (241.3 MG) MG PO TABS
200.0000 mg | ORAL_TABLET | Freq: Two times a day (BID) | ORAL | Status: DC
  Administered 2015-09-24 – 2015-10-05 (×22): 200 mg via ORAL
  Filled 2015-09-24 (×22): qty 1

## 2015-09-24 MED ORDER — SENNOSIDES-DOCUSATE SODIUM 8.6-50 MG PO TABS
2.0000 | ORAL_TABLET | Freq: Every day | ORAL | Status: DC
  Administered 2015-09-24 – 2015-10-04 (×11): 2 via ORAL
  Filled 2015-09-24 (×11): qty 2

## 2015-09-24 MED ORDER — METHOCARBAMOL 500 MG PO TABS: 500.0000 mg | ORAL_TABLET | Freq: Four times a day (QID) | ORAL | Status: DC | PRN

## 2015-09-24 MED ORDER — DEXTROSE 5 % IV SOLN
2.0000 g | Freq: Two times a day (BID) | INTRAVENOUS | Status: AC
  Administered 2015-09-24 – 2015-09-27 (×7): 2 g via INTRAVENOUS
  Filled 2015-09-24 (×7): qty 2

## 2015-09-24 MED ORDER — PROCHLORPERAZINE EDISYLATE 5 MG/ML IJ SOLN: 5.0000 mg | Freq: Four times a day (QID) | INTRAMUSCULAR | Status: DC | PRN

## 2015-09-24 MED ORDER — POTASSIUM CHLORIDE CRYS ER 10 MEQ PO TBCR
10.0000 meq | EXTENDED_RELEASE_TABLET | Freq: Every day | ORAL | Status: DC
  Administered 2015-09-24 – 2015-09-28 (×5): 10 meq via ORAL
  Filled 2015-09-24 (×5): qty 1

## 2015-09-24 MED ORDER — GUAIFENESIN-DM 100-10 MG/5ML PO SYRP: 5.0000 mL | ORAL_SOLUTION | Freq: Four times a day (QID) | ORAL | Status: DC | PRN

## 2015-09-24 MED ORDER — ENOXAPARIN SODIUM 30 MG/0.3ML ~~LOC~~ SOLN
30.0000 mg | SUBCUTANEOUS | Status: DC
  Administered 2015-09-25 – 2015-10-03 (×9): 30 mg via SUBCUTANEOUS
  Filled 2015-09-24 (×10): qty 0.3

## 2015-09-24 MED ORDER — PROCHLORPERAZINE 25 MG RE SUPP: 12.5000 mg | Freq: Four times a day (QID) | RECTAL | Status: DC | PRN

## 2015-09-24 MED ORDER — PRO-STAT SUGAR FREE PO LIQD
30.0000 mL | Freq: Two times a day (BID) | ORAL | Status: DC
Start: 2015-09-24 — End: 2015-10-05
  Administered 2015-09-24 – 2015-10-05 (×21): 30 mL via ORAL
  Filled 2015-09-24 (×22): qty 30

## 2015-09-24 MED ORDER — NYSTATIN 100000 UNIT/GM EX CREA
TOPICAL_CREAM | Freq: Two times a day (BID) | CUTANEOUS | Status: DC
  Administered 2015-09-24 – 2015-10-05 (×17): via TOPICAL
  Filled 2015-09-24 (×3): qty 15

## 2015-09-24 MED ORDER — ACETAMINOPHEN 325 MG PO TABS
325.0000 mg | ORAL_TABLET | ORAL | Status: DC | PRN
  Filled 2015-09-24: qty 2

## 2015-09-24 MED ORDER — PANTOPRAZOLE SODIUM 40 MG PO TBEC
40.0000 mg | DELAYED_RELEASE_TABLET | Freq: Every day | ORAL | Status: DC
  Administered 2015-09-25 – 2015-10-05 (×11): 40 mg via ORAL
  Filled 2015-09-24 (×11): qty 1

## 2015-09-24 MED ORDER — FOLIC ACID 1 MG PO TABS
1.0000 mg | ORAL_TABLET | Freq: Every day | ORAL | Status: DC
  Administered 2015-09-24 – 2015-10-05 (×12): 1 mg via ORAL
  Filled 2015-09-24 (×12): qty 1

## 2015-09-24 MED ORDER — PROCHLORPERAZINE MALEATE 5 MG PO TABS: 5.0000 mg | ORAL_TABLET | Freq: Four times a day (QID) | ORAL | Status: DC | PRN

## 2015-09-24 MED ORDER — OXYCODONE HCL 5 MG PO TABS
5.0000 mg | ORAL_TABLET | ORAL | Status: DC | PRN
  Administered 2015-09-24 – 2015-09-25 (×2): 5 mg via ORAL
  Administered 2015-09-25: 10 mg via ORAL
  Administered 2015-09-25 – 2015-09-28 (×9): 5 mg via ORAL
  Administered 2015-09-28: 10 mg via ORAL
  Administered 2015-09-29 – 2015-10-01 (×8): 5 mg via ORAL
  Administered 2015-10-01: 10 mg via ORAL
  Administered 2015-10-01: 5 mg via ORAL
  Administered 2015-10-02 – 2015-10-03 (×8): 10 mg via ORAL
  Administered 2015-10-04: 5 mg via ORAL
  Administered 2015-10-04 – 2015-10-05 (×4): 10 mg via ORAL
  Filled 2015-09-24 (×4): qty 1
  Filled 2015-09-24 (×3): qty 2
  Filled 2015-09-24: qty 1
  Filled 2015-09-24 (×2): qty 2
  Filled 2015-09-24 (×3): qty 1
  Filled 2015-09-24: qty 2
  Filled 2015-09-24: qty 1
  Filled 2015-09-24: qty 2
  Filled 2015-09-24 (×2): qty 1
  Filled 2015-09-24: qty 2
  Filled 2015-09-24 (×2): qty 1
  Filled 2015-09-24 (×2): qty 2
  Filled 2015-09-24 (×2): qty 1
  Filled 2015-09-24 (×5): qty 2
  Filled 2015-09-24 (×3): qty 1
  Filled 2015-09-24: qty 2
  Filled 2015-09-24: qty 1
  Filled 2015-09-24: qty 2
  Filled 2015-09-24 (×2): qty 1
  Filled 2015-09-24: qty 2

## 2015-09-24 MED ORDER — ALUM & MAG HYDROXIDE-SIMETH 200-200-20 MG/5ML PO SUSP: 30.0000 mL | ORAL | Status: DC | PRN

## 2015-09-24 NOTE — H&P (Signed)
Physical Medicine and Rehabilitation Admission H&P    CC: Debility.    HPI: John Hoover is a 50 year old male originally admitted to [facility] on 08/27/15 with malaise and myalgias due to influenza with AKI and rhabdomyolysis. Hospital course complicated with MODS due to septic shock and respiratory failure requiring intubation. He has required CRRT due to renal failure with volume overload as well as pressors due to hypotension.   He has also had issues with agitation requiring sedation, heme positive stools, thrombocytopenia, anemia requiring multiple units PRBC, lethargy and deconditioning. Dr. Delon [place] [facility] consulted and work consistent with platelets decline due to sepsis and retic count low due to likely due to suppression. Has been on muliple antibiotics with respiratory cultures growing enterococcus faecalis and stenotrophomonas as well as stress dose steriods.  He was transferred to [facility] on 04/13 for rehab.  Wife has been assisting with lymphedema wraps to help with peripheral edema.  Renal status improving and he has been weaned off oxygen.  Patient with fever on 04/20 and pan cultured. Cefepime added due to concerns of PNA and to continue for a week. Therapy ongoing and patient showing improvement in activity tolerance. CIR recommended for follow up therapy.      Review of Systems  Constitutional: Negative for fever.  HENT: Negative for hearing loss.   Eyes: Negative for blurred vision and double vision.  Respiratory: Negative for cough.   Cardiovascular: Negative for chest pain and palpitations.  Gastrointestinal: Negative for heartburn, nausea, abdominal pain and constipation.       Metallic taste in mouth affecting appetite  Genitourinary: Negative for dysuria, urgency and frequency.  Musculoskeletal: Negative for myalgias and back pain.  Neurological: Positive for sensory change (numbness bilateral finger tips. Pain due to burning/stinging bilateral feet. ) and weakness.  Negative for headaches.  Psychiatric/Behavioral: Negative for memory loss. The patient is nervous/anxious and has insomnia (due to anxiety).   All other systems reviewed and are negative.     Past Medical History  Diagnosis Date  . Meniere's disease     Past Surgical History  Procedure Laterality Date  . Nasal sinus surgery      Family History  Problem Relation Age of Onset  . Hypertension Mother   . Hypertension Father   . Colon cancer Neg Hx   . Prostate cancer Neg Hx     Social History: Married. Independent and working PTA. He  reports that he has never smoked. He has never used smokeless tobacco. He reports that he drinks a couple of beers on the weekends. He reports that he does not use illicit drugs.    Allergies: No Known Allergies    Medications Prior to Admission  Medication Sig Dispense Refill  . acetaminophen (TYLENOL) 325 MG tablet Take 650 mg by mouth every 6 (six) hours as needed.    Marland Kitchen azithromycin (ZITHROMAX) 1 g powder Take 1 g by mouth once.    Marland Kitchen ibuprofen (ADVIL,MOTRIN) 200 MG tablet Take 200 mg by mouth every 6 (six) hours as needed.      Home: Two level home with 3 STE Has bedroom and bath on 1st level.    Functional History: Independent and working full time PTA  Functional Status:  Mobility: Moderate assist for transfers Moderate assist for ambulation of 24' with RW   ADL: Moderate assist for upper body care Total assist for lower body care  Cognition: Memory and cognition seem to be intact.  There were no vitals taken for this visit. Physical Exam  Nursing note and vitals reviewed. Constitutional: He is oriented to person, place, and time. He appears well-developed and well-nourished. No distress.  Mildly anxious.   HENT:  Head: Normocephalic and atraumatic.  Mouth/Throat: Oropharynx is clear and moist.  Eyes: Conjunctivae and EOM are normal. Pupils are equal, round, and reactive to light. Right eye exhibits no discharge.    Neck: Normal range of motion. Neck supple.  Cardiovascular: Normal rate and regular rhythm.   Respiratory: Effort normal. No respiratory distress. He has wheezes in the right lower field.  GI: Soft. Bowel sounds are normal. He exhibits no distension. There is no tenderness.  Musculoskeletal: He exhibits edema and tenderness.  2+ edema left elbow (depedendent due to positioning)  2 + edema with tenting bilateral lower shins and ankles.   Neurological: He is alert and oriented to person, place, and time.  Speech slightly slurred but clear.  Able to follow commands without difficulty.   Sensory deficits from mid shin to bilateral toes.  DTRs symmetric Motor: 4+/5 throughout, except bilateral ankle dorsi/plantar flexion 3-/5  Skin: Skin is warm and dry. No rash noted.  Buttocks and groin with red peeling skin c/w yeast overgrowth.  Ischemic changes to bilateral toes and fingertips  Psychiatric: His behavior is normal. Thought content normal. His mood appears anxious. His speech is slurred (mild slurring). Cognition and memory are normal.    Results for orders placed or performed during the hospital encounter of 09/13/15 (from the past 48 hour(s))  CBC with Differential/Platelet     Status: Abnormal   Collection Time: 09/23/15  7:28 AM  Result Value Ref Range   WBC 5.3 4.0 - 10.5 K/uL   RBC 2.53 (L) 4.22 - 5.81 MIL/uL   Hemoglobin 7.4 (L) 13.0 - 17.0 g/dL   HCT 24.0 (L) 39.0 - 52.0 %   MCV 94.9 78.0 - 100.0 fL   MCH 29.2 26.0 - 34.0 pg   MCHC 30.8 30.0 - 36.0 g/dL   RDW 16.7 (H) 11.5 - 15.5 %   Platelets 195 150 - 400 K/uL   Neutrophils Relative % 50 %   Lymphocytes Relative 35 %   Monocytes Relative 11 %   Eosinophils Relative 3 %   Basophils Relative 1 %   Neutro Abs 2.5 1.7 - 7.7 K/uL   Lymphs Abs 1.9 0.7 - 4.0 K/uL   Monocytes Absolute 0.6 0.1 - 1.0 K/uL   Eosinophils Absolute 0.2 0.0 - 0.7 K/uL   Basophils Absolute 0.1 0.0 - 0.1 K/uL   WBC Morphology ATYPICAL LYMPHOCYTES    Renal function panel     Status: Abnormal   Collection Time: 09/23/15  7:28 AM  Result Value Ref Range   Sodium 143 135 - 145 mmol/L   Potassium 3.5 3.5 - 5.1 mmol/L   Chloride 110 101 - 111 mmol/L   CO2 23 22 - 32 mmol/L   Glucose, Bld 94 65 - 99 mg/dL   BUN 44 (H) 6 - 20 mg/dL   Creatinine, Ser 2.98 (H) 0.61 - 1.24 mg/dL   Calcium 8.2 (L) 8.9 - 10.3 mg/dL   Phosphorus 3.9 2.5 - 4.6 mg/dL   Albumin 1.9 (L) 3.5 - 5.0 g/dL   GFR calc non Af Amer 23 (L) >60 mL/min   GFR calc Af Amer 26 (L) >60 mL/min    Comment: (NOTE) The eGFR has been calculated using the CKD EPI equation. This calculation has not been validated  in all clinical situations. eGFR's persistently <60 mL/min signify possible Chronic Kidney Disease.    Anion gap 10 5 - 15  Magnesium     Status: Abnormal   Collection Time: 09/23/15  7:28 AM  Result Value Ref Range   Magnesium 1.6 (L) 1.7 - 2.4 mg/dL  CBC with Differential/Platelet     Status: Abnormal   Collection Time: 09/24/15  6:24 AM  Result Value Ref Range   WBC 5.4 4.0 - 10.5 K/uL   RBC 2.52 (L) 4.22 - 5.81 MIL/uL   Hemoglobin 7.5 (L) 13.0 - 17.0 g/dL   HCT 24.1 (L) 39.0 - 52.0 %   MCV 95.6 78.0 - 100.0 fL   MCH 29.8 26.0 - 34.0 pg   MCHC 31.1 30.0 - 36.0 g/dL   RDW 16.4 (H) 11.5 - 15.5 %   Platelets 202 150 - 400 K/uL   Neutrophils Relative % 41 %   Neutro Abs 2.2 1.7 - 7.7 K/uL   Lymphocytes Relative 44 %   Lymphs Abs 2.4 0.7 - 4.0 K/uL   Monocytes Relative 11 %   Monocytes Absolute 0.6 0.1 - 1.0 K/uL   Eosinophils Relative 3 %   Eosinophils Absolute 0.2 0.0 - 0.7 K/uL   Basophils Relative 1 %   Basophils Absolute 0.0 0.0 - 0.1 K/uL  Magnesium     Status: None   Collection Time: 09/24/15  6:24 AM  Result Value Ref Range   Magnesium 1.7 1.7 - 2.4 mg/dL  Renal function panel     Status: Abnormal   Collection Time: 09/24/15  6:24 AM  Result Value Ref Range   Sodium 141 135 - 145 mmol/L   Potassium 3.4 (L) 3.5 - 5.1 mmol/L   Chloride 107 101 -  111 mmol/L   CO2 22 22 - 32 mmol/L   Glucose, Bld 91 65 - 99 mg/dL   BUN 39 (H) 6 - 20 mg/dL   Creatinine, Ser 2.45 (H) 0.61 - 1.24 mg/dL   Calcium 8.3 (L) 8.9 - 10.3 mg/dL   Phosphorus 3.8 2.5 - 4.6 mg/dL   Albumin 2.1 (L) 3.5 - 5.0 g/dL   GFR calc non Af Amer 29 (L) >60 mL/min   GFR calc Af Amer 33 (L) >60 mL/min    Comment: (NOTE) The eGFR has been calculated using the CKD EPI equation. This calculation has not been validated in all clinical situations. eGFR's persistently <60 mL/min signify possible Chronic Kidney Disease.    Anion gap 12 5 - 15   No results found.     Medical Problem List and Plan: 1.  Weakness, abnormality of gait, pain secondary to Debility.  2.  DVT Prophylaxis/Anticoagulation: Pharmaceutical: Lovenox 3. Pain Management: will add Neurontin to help with neuropathic pain which is also causing a lot of anxiety.  Continue oxycodone prn. Educated on spreading out narcotics.  4. Mood: Team to provide ego support. Will add low dose xanax for prn use.  5. Neuropsych: This patient is capable of making decisions on his own behalf. 6. Skin/Wound Care: Educated on pressure relief measures. Will add nystatin cream to peri area and dose of diflucan 7. Fluids/Electrolytes/Nutrition:  Monitor I/O. Check lytes in am. Wife advised to bring food from home.  8. Anemia of critical illness: Will check anemia panel.  9. Hypokalemia: continue Magnesium supplement. Will add low dose supplement. 10. Peripheral edema: Diurese prn. Patient and wife educated on monitoring salt intake.  Will add protein supplement due to low protein stores.  11. Acute renal failure due to ATN: avoid nephrotoxic medications. Monitor with serial checks--MWF till renal status normalizes.  12. Yeast infection: Will treat   Post Admission Physician Evaluation: 1. Functional deficits secondary  to weakness, AKI. 2. Patient is admitted to receive collaborative, interdisciplinary care between the  physiatrist, rehab nursing staff, and therapy team. 3. Patient's level of medical complexity and substantial therapy needs in context of that medical necessity cannot be provided at a lesser intensity of care such as a SNF. 4. Patient has experienced substantial functional loss from his/her baseline which was documented above under the "Functional History" and "Functional Status" headings.  Judging by the patient's diagnosis, physical exam, and functional history, the patient has potential for functional progress which will result in measurable gains while on inpatient rehab.  These gains will be of substantial and practical use upon discharge  in facilitating mobility and self-care at the household level. 5. Physiatrist will provide 24 hour management of medical needs as well as oversight of the therapy plan/treatment and provide guidance as appropriate regarding the interaction of the two. 6. 24 hour rehab nursing will assist with safety, skin/wound care, disease management, medication administration, pain management and patient education and help integrate therapy concepts, techniques,education, etc. 7. PT will assess and treat for/with: Lower extremity strength, range of motion, stamina, balance, functional mobility, safety, adaptive techniques and equipment, woundcare, coping skills, pain control, education.   Goals are Min A. 8. OT will assess and treat for/with: ADL's, functional mobility, safety, upper extremity strength, adaptive techniques and equipment, wound mgt, ego support, and community reintegration.   Goals are: Min A. Therapy may proceed with showering this patient. 9. Case Management and Social Worker will assess and treat for psychological issues and discharge planning. 10. Team conference will be held weekly to assess progress toward goals and to determine barriers to discharge. 11. Patient will receive at least 3 hours of therapy per day at least 5 days per week. 12. ELOS: 16-20  days. 13. Prognosis:  excellent  Delice Lesch, MD 09/24/2015

## 2015-09-24 NOTE — Interval H&P Note (Signed)
Peterson AoWilliam E Seyfried was admitted today to Inpatient Rehabilitation with the diagnosis of debility.  The patient's history has been reviewed, patient examined, and there is no change in status.  Patient continues to be appropriate for intensive inpatient rehabilitation.  I have reviewed the patient's chart and labs.  Questions were answered to the patient's satisfaction. The PAPE has been reviewed and assessment remains appropriate.  Ankit Karis Jubanil Patel 09/24/2015, 3:51 PM

## 2015-09-24 NOTE — Progress Notes (Signed)
[facility]  ROUNDING NOTE   Subjective:  [facility]  Cr now down to 2.45. Good UOP noted. Dialysis catheter has been removed. Overall doing well. Going to acute rehab today.    Objective:  Vital signs in last 24 hours:   temperature 98.4 pulse 91 respirations 15 blood pressure 100/62      Physical Exam: General: No acute distress  H/ENT [place]/AT  hearing intact  Eyes: EOMI  Neck: trachea midline,  supple  Lungs:  Mild basilar rales otherwise clear  Heart: S1S2 no rubs  Abdomen:  Soft, nontender  Extremities: trace dependent edema  Neurologic: Awake, alert, following commands  Skin: Skin changes noted in bilateral toes and right hand       Basic Metabolic Panel:  Recent Labs Lab 09/19/15 0640 09/20/15 0135 09/21/15 0648 09/22/15 0430 09/23/15 0728 09/24/15 0624  NA 141 143 142  142 142 143 141  K 3.5 3.3* 3.2*  3.2* 3.2* 3.5 3.4*  CL 104 107 106  107 108 110 107  CO2 GLUCOSE 92 102* 99  100* 99 94 91  BUN 63* 62* 53*  53* 49* 44* 39*  CREATININE 6.57* 6.01* 4.86*  4.78* 3.94* 2.98* 2.45*  CALCIUM 8.1* 8.0* 8.0*  8.0* 7.9* 8.2* 8.3*  MG 2.0  --  1.7 1.7 1.6* 1.7  PHOS 5.7*  --  4.7* 4.2 3.9 3.8    Liver Function Tests:  Recent Labs Lab 09/19/15 0640 09/21/15 0648 09/22/15 0430 09/23/15 0728 09/24/15 0624  ALBUMIN 2.3* 2.1* 2.0* 1.9* 2.1*   No results for input(s): LIPASE, AMYLASE in the last 168 hours. No results for input(s): AMMONIA in the last 168 hours.  CBC:  Recent Labs Lab 09/18/15 0630 09/19/15 0640 09/21/15 0648 09/23/15 0728 09/24/15 0624  WBC 4.9 5.1 4.9 5.3 5.4  NEUTROABS 2.7 3.1 2.8 2.5 2.2  HGB 7.6* 8.0* 7.3* 7.4* 7.5*  HCT 24.3* 25.1* 23.7* 24.0* 24.1*  MCV 93.8 94.7 94.0 94.9 95.6  PLT 141* 150 171 195 202    Cardiac Enzymes:  Recent Labs Lab 09/21/15 0648  CKTOTAL 168    BNP: Invalid input(s): POCBNP  CBG: No results for input(s):  GLUCAP in the last 168 hours.  Microbiology: Results for orders placed or performed during the hospital encounter of 09/13/15  Culture, blood (routine x 2)     Status: None (Preliminary result)   Collection Time: 09/20/15  1:35 AM  Result Value Ref Range Status   Specimen Description BLOOD RIGHT ANTECUBITAL  Final   Special Requests   Final    BOTTLES DRAWN AEROBIC AND ANAEROBIC 10CC BLUE 5CC PURPLE   Culture NO GROWTH 4 DAYS  Final   Report Status PENDING  Incomplete  Culture, blood (routine x 2)     Status: None (Preliminary result)   Collection Time: 09/20/15  1:40 AM  Result Value Ref Range Status   Specimen Description BLOOD RIGHT HAND  Final   Special Requests BOTTLES DRAWN AEROBIC AND ANAEROBIC 5CC  Final   Culture NO GROWTH 4 DAYS  Final   Report Status PENDING  Incomplete  Culture, Urine     Status: None   Collection Time: 09/20/15  2:33 PM  Result Value Ref Range Status   Specimen Description URINE, RANDOM  Final   Special Requests NONE  Final   Culture NO GROWTH 1 DAY  Final   Report Status 09/21/2015 FINAL  Final  Coagulation Studies: No results for input(s): LABPROT, INR in the last 72 hours.  Urinalysis: No results for input(s): COLORURINE, LABSPEC, PHURINE, GLUCOSEU, HGBUR, BILIRUBINUR, KETONESUR, PROTEINUR, UROBILINOGEN, NITRITE, LEUKOCYTESUR in the last 72 hours.  Invalid input(s): APPERANCEUR    Imaging: No results found.   Medications:       Assessment/ Plan:  John Hoover is a 50 y.o. white male with no specific past medical history, who was [facility] + Ibuprofen use prior to admission (600 mg TID x 2 days). [facility]  1. Acute Renal Failure,   Likely secondary to ATN 2. Metabolic Acidosis 3. Rhabdomyolysis with hyperphosphatemia 4. Acute respiratory failure, Stenotrophomonas PNA, extubated 09/10/15. 5. Influenza A positive 6. Hypoalbuminemia 7. Anasarca/Generalized edema 8. Anemia and  Thrombocytopenia   9. Hyperkalemia.  Plan: Creatinine continues to trend down nicely. Currently creatinine is 2.45. Dialysis catheter was previously discontinued. We do not anticipate any further need for renal replacement therapy. However he could benefit from followup of renal function at least once as an outpatient. I advised the patient of this. Overall he has had significant improvement in his condition. [facility] will be [facility]. We wished the patient well.    LOS:  John Hoover 4/24/20173:53 PM

## 2015-09-24 NOTE — H&P (View-Only) (Signed)
Physical Medicine and Rehabilitation Admission H&P    CC: Debility.    HPI: John Hoover is a 50 year old male originally admitted to [facility] on 08/27/15 with malaise and myalgias due to influenza with AKI and rhabdomyolysis. Hospital course complicated with MODS due to septic shock and respiratory failure requiring intubation. He has required CRRT due to renal failure with volume overload as well as pressors due to hypotension.   He has also had issues with agitation requiring sedation, heme positive stools, thrombocytopenia, anemia requiring multiple units PRBC, lethargy and deconditioning. Dr. [facility] [place] [facility] consulted and work consistent with platelets decline due to sepsis and retic count low due to likely due to suppression. Has been on muliple antibiotics with respiratory cultures growing enterococcus faecalis and stenotrophomonas as well as stress dose steriods.  He was transferred to [facility] on 04/13 for rehab.  Wife has been assisting with lymphedema wraps to help with peripheral edema.  Renal status improving and he has been weaned off oxygen.  Patient with fever on 04/20 and pan cultured. Cefepime added due to concerns of PNA and to continue for a week. Therapy ongoing and patient showing improvement in activity tolerance. CIR recommended for follow up therapy.      Review of Systems  Constitutional: Negative for fever.  HENT: Negative for hearing loss.   Eyes: Negative for blurred vision and double vision.  Respiratory: Negative for cough.   Cardiovascular: Negative for chest pain and palpitations.  Gastrointestinal: Negative for heartburn, nausea, abdominal pain and constipation.       Metallic taste in mouth affecting appetite  Genitourinary: Negative for dysuria, urgency and frequency.  Musculoskeletal: Negative for myalgias and back pain.  Neurological: Positive for sensory change (numbness bilateral finger tips. Pain due to burning/stinging bilateral feet. ) and weakness.  Negative for headaches.  Psychiatric/Behavioral: Negative for memory loss. The patient is nervous/anxious and has insomnia (due to anxiety).   All other systems reviewed and are negative.     Past Medical History  Diagnosis Date  . Meniere's disease     Past Surgical History  Procedure Laterality Date  . Nasal sinus surgery      Family History  Problem Relation Age of Onset  . Hypertension Mother   . Hypertension Father   . Colon cancer Neg Hx   . Prostate cancer Neg Hx     Social History: Married. Independent and working PTA. He  reports that he has never smoked. He has never used smokeless tobacco. He reports that he drinks a couple of beers on the weekends. He reports that he does not use illicit drugs.    Allergies: No Known Allergies    Medications Prior to Admission  Medication Sig Dispense Refill  . acetaminophen (TYLENOL) 325 MG tablet Take 650 mg by mouth every 6 (six) hours as needed.    Marland Kitchen azithromycin (ZITHROMAX) 1 g powder Take 1 g by mouth once.    Marland Kitchen ibuprofen (ADVIL,MOTRIN) 200 MG tablet Take 200 mg by mouth every 6 (six) hours as needed.      Home: Two level home with 3 STE Has bedroom and bath on 1st level.    Functional History: Independent and working full time PTA  Functional Status:  Mobility: Moderate assist for transfers Moderate assist for ambulation of 24' with RW   ADL: Moderate assist for upper body care Total assist for lower body care  Cognition: Memory and cognition seem to be intact.  There were no vitals taken for this visit. Physical Exam  Nursing note and vitals reviewed. Constitutional: He is oriented to person, place, and time. He appears well-developed and well-nourished. No distress.  Mildly anxious.   HENT:  Head: Normocephalic and atraumatic.  Mouth/Throat: Oropharynx is clear and moist.  Eyes: Conjunctivae and EOM are normal. Pupils are equal, round, and reactive to light. Right eye exhibits no discharge.    Neck: Normal range of motion. Neck supple.  Cardiovascular: Normal rate and regular rhythm.   Respiratory: Effort normal. No respiratory distress. He has wheezes in the right lower field.  GI: Soft. Bowel sounds are normal. He exhibits no distension. There is no tenderness.  Musculoskeletal: He exhibits edema and tenderness.  2+ edema left elbow (depedendent due to positioning)  2 + edema with tenting bilateral lower shins and ankles.   Neurological: He is alert and oriented to person, place, and time.  Speech slightly slurred but clear.  Able to follow commands without difficulty.   Sensory deficits from mid shin to bilateral toes.  DTRs symmetric Motor: 4+/5 throughout, except bilateral ankle dorsi/plantar flexion 3-/5  Skin: Skin is warm and dry. No rash noted.  Buttocks and groin with red peeling skin c/w yeast overgrowth.  Ischemic changes to bilateral toes and fingertips  Psychiatric: His behavior is normal. Thought content normal. His mood appears anxious. His speech is slurred (mild slurring). Cognition and memory are normal.    Results for orders placed or performed during the hospital encounter of 09/13/15 (from the past 48 hour(s))  CBC with Differential/Platelet     Status: Abnormal   Collection Time: 09/23/15  7:28 AM  Result Value Ref Range   WBC 5.3 4.0 - 10.5 K/uL   RBC 2.53 (L) 4.22 - 5.81 MIL/uL   Hemoglobin 7.4 (L) 13.0 - 17.0 g/dL   HCT 24.0 (L) 39.0 - 52.0 %   MCV 94.9 78.0 - 100.0 fL   MCH 29.2 26.0 - 34.0 pg   MCHC 30.8 30.0 - 36.0 g/dL   RDW 16.7 (H) 11.5 - 15.5 %   Platelets 195 150 - 400 K/uL   Neutrophils Relative % 50 %   Lymphocytes Relative 35 %   Monocytes Relative 11 %   Eosinophils Relative 3 %   Basophils Relative 1 %   Neutro Abs 2.5 1.7 - 7.7 K/uL   Lymphs Abs 1.9 0.7 - 4.0 K/uL   Monocytes Absolute 0.6 0.1 - 1.0 K/uL   Eosinophils Absolute 0.2 0.0 - 0.7 K/uL   Basophils Absolute 0.1 0.0 - 0.1 K/uL   WBC Morphology ATYPICAL LYMPHOCYTES    Renal function panel     Status: Abnormal   Collection Time: 09/23/15  7:28 AM  Result Value Ref Range   Sodium 143 135 - 145 mmol/L   Potassium 3.5 3.5 - 5.1 mmol/L   Chloride 110 101 - 111 mmol/L   CO2 23 22 - 32 mmol/L   Glucose, Bld 94 65 - 99 mg/dL   BUN 44 (H) 6 - 20 mg/dL   Creatinine, Ser 2.98 (H) 0.61 - 1.24 mg/dL   Calcium 8.2 (L) 8.9 - 10.3 mg/dL   Phosphorus 3.9 2.5 - 4.6 mg/dL   Albumin 1.9 (L) 3.5 - 5.0 g/dL   GFR calc non Af Amer 23 (L) >60 mL/min   GFR calc Af Amer 26 (L) >60 mL/min    Comment: (NOTE) The eGFR has been calculated using the CKD EPI equation. This calculation has not been validated  in all clinical situations. eGFR's persistently <60 mL/min signify possible Chronic Kidney Disease.    Anion gap 10 5 - 15  Magnesium     Status: Abnormal   Collection Time: 09/23/15  7:28 AM  Result Value Ref Range   Magnesium 1.6 (L) 1.7 - 2.4 mg/dL  CBC with Differential/Platelet     Status: Abnormal   Collection Time: 09/24/15  6:24 AM  Result Value Ref Range   WBC 5.4 4.0 - 10.5 K/uL   RBC 2.52 (L) 4.22 - 5.81 MIL/uL   Hemoglobin 7.5 (L) 13.0 - 17.0 g/dL   HCT 24.1 (L) 39.0 - 52.0 %   MCV 95.6 78.0 - 100.0 fL   MCH 29.8 26.0 - 34.0 pg   MCHC 31.1 30.0 - 36.0 g/dL   RDW 16.4 (H) 11.5 - 15.5 %   Platelets 202 150 - 400 K/uL   Neutrophils Relative % 41 %   Neutro Abs 2.2 1.7 - 7.7 K/uL   Lymphocytes Relative 44 %   Lymphs Abs 2.4 0.7 - 4.0 K/uL   Monocytes Relative 11 %   Monocytes Absolute 0.6 0.1 - 1.0 K/uL   Eosinophils Relative 3 %   Eosinophils Absolute 0.2 0.0 - 0.7 K/uL   Basophils Relative 1 %   Basophils Absolute 0.0 0.0 - 0.1 K/uL  Magnesium     Status: None   Collection Time: 09/24/15  6:24 AM  Result Value Ref Range   Magnesium 1.7 1.7 - 2.4 mg/dL  Renal function panel     Status: Abnormal   Collection Time: 09/24/15  6:24 AM  Result Value Ref Range   Sodium 141 135 - 145 mmol/L   Potassium 3.4 (L) 3.5 - 5.1 mmol/L   Chloride 107 101 -  111 mmol/L   CO2 22 22 - 32 mmol/L   Glucose, Bld 91 65 - 99 mg/dL   BUN 39 (H) 6 - 20 mg/dL   Creatinine, Ser 2.45 (H) 0.61 - 1.24 mg/dL   Calcium 8.3 (L) 8.9 - 10.3 mg/dL   Phosphorus 3.8 2.5 - 4.6 mg/dL   Albumin 2.1 (L) 3.5 - 5.0 g/dL   GFR calc non Af Amer 29 (L) >60 mL/min   GFR calc Af Amer 33 (L) >60 mL/min    Comment: (NOTE) The eGFR has been calculated using the CKD EPI equation. This calculation has not been validated in all clinical situations. eGFR's persistently <60 mL/min signify possible Chronic Kidney Disease.    Anion gap 12 5 - 15   No results found.     Medical Problem List and Plan: 1.  Weakness, abnormality of gait, pain secondary to Debility.  2.  DVT Prophylaxis/Anticoagulation: Pharmaceutical: Lovenox 3. Pain Management: will add Neurontin to help with neuropathic pain which is also causing a lot of anxiety.  Continue oxycodone prn. Educated on spreading out narcotics.  4. Mood: Team to provide ego support. Will add low dose xanax for prn use.  5. Neuropsych: This patient is capable of making decisions on his own behalf. 6. Skin/Wound Care: Educated on pressure relief measures. Will add nystatin cream to peri area and dose of diflucan 7. Fluids/Electrolytes/Nutrition:  Monitor I/O. Check lytes in am. Wife advised to bring food from home.  8. Anemia of critical illness: Will check anemia panel.  9. Hypokalemia: continue Magnesium supplement. Will add low dose supplement. 10. Peripheral edema: Diurese prn. Patient and wife educated on monitoring salt intake.  Will add protein supplement due to low protein stores.  11. Acute renal failure due to ATN: avoid nephrotoxic medications. Monitor with serial checks--MWF till renal status normalizes.  12. Yeast infection: Will treat   Post Admission Physician Evaluation: 1. Functional deficits secondary  to weakness, AKI. 2. Patient is admitted to receive collaborative, interdisciplinary care between the  physiatrist, rehab nursing staff, and therapy team. 3. Patient's level of medical complexity and substantial therapy needs in context of that medical necessity cannot be provided at a lesser intensity of care such as a SNF. 4. Patient has experienced substantial functional loss from his/her baseline which was documented above under the "Functional History" and "Functional Status" headings.  Judging by the patient's diagnosis, physical exam, and functional history, the patient has potential for functional progress which will result in measurable gains while on inpatient rehab.  These gains will be of substantial and practical use upon discharge  in facilitating mobility and self-care at the household level. 5. Physiatrist will provide 24 hour management of medical needs as well as oversight of the therapy plan/treatment and provide guidance as appropriate regarding the interaction of the two. 6. 24 hour rehab nursing will assist with safety, skin/wound care, disease management, medication administration, pain management and patient education and help integrate therapy concepts, techniques,education, etc. 7. PT will assess and treat for/with: Lower extremity strength, range of motion, stamina, balance, functional mobility, safety, adaptive techniques and equipment, woundcare, coping skills, pain control, education.   Goals are Min A. 8. OT will assess and treat for/with: ADL's, functional mobility, safety, upper extremity strength, adaptive techniques and equipment, wound mgt, ego support, and community reintegration.   Goals are: Min A. Therapy may proceed with showering this patient. 9. Case Management and Social Worker will assess and treat for psychological issues and discharge planning. 10. Team conference will be held weekly to assess progress toward goals and to determine barriers to discharge. 11. Patient will receive at least 3 hours of therapy per day at least 5 days per week. 12. ELOS: 16-20  days. 13. Prognosis:  excellent  Delice Lesch, MD 09/24/2015

## 2015-09-24 NOTE — Progress Notes (Signed)
Cristina Gong, RN Rehab Admission Coordinator Signed Physical Medicine and Rehabilitation PMR Pre-admission 09/21/2015 3:19 PM  Related encounter: Documentation from 09/21/2015 in [facility]     Secondary Market PMR Admission Coordinator Pre-Admission Assessment  Patient: John Hoover is an 50 y.o., male MRN: [id number] DOB: [birth year] Height:   Weight:    Insurance Information HMO: PPO: yes PCP: IPA: 80/20: OTHER:  PRIMARY: [place] Policy#: [id number] Subscriber: pt CM Name: Glenard Haring Phone#: [phone] Fax#: [phone] Pre-Cert#: [id number] Employer: Approved for 14 days when update is due. 4/24 until 5/1 Benefits: Phone #: [phone] Name: 09/21/15 Eff. Date: 06/02/14 Deduct: $500 Out of Pocket Max: $3000 Life Max: none CIR: 80% 30 days SNF: 80% 120 days Outpatient: $25 copay per visit Co-Pay: 30 visits combined Home Health: 80% Co-Pay: no visit limit DME: 80% Co-Pay: 20% Providers: in network  SECONDARY: none  Medicaid Application Date: Case Manager:  Disability Application Date: Case Worker:   Emergency Contact Information Contact Information    Name Relation Home Work Mobile   [place] Spouse [phone]  [place]   [phone]   [place], Abe   [phone]      Current Medical History  Patient Admitting Diagnosis: Debility, acute renal failure  History of Present Illness: 50 yo male admitted to [facility] on 08/27/2015 with several days of malaise and profound myalgias. Influenza PCR positive. Hemoconcentrated with AKI and elevated CPK. On 3/28 developed progressive AMS and found to be profoundly metabolic acidodic. Transferred to the ICU and PCCM consulted for rhabdo, and shock. Pt intubated and HD cath placed for CRRT, bicarb IV and  antibiotics. Patient did require transfusions of platelets and RBCS. Initially had lots of agitation and required propofol. Eventually extubated 4/10 but lethargic and profoundly deconditioned. Admitted to [facility] on 09/13/2015.  At [facility] Nephrology consulted to manage renal issues with renal failure gradually resolving with creat 4/24 of 2.45. Pt with low grade fevers with all lines removed. Infectious workup and felt likely left lower lobe pneumonia and began cefepime IV. Bilateral skin necrosis to hands noted and felt to vasopressor infusion. Hgb 7.5 4/24.  Patient's medical record from [facility] and [facility] has been reviewed by the rehabilitation admission coordinator and physician as well as bedside assessment with pt and wife present for assessment.  NIH Stroke scale: NA Glascow Coma Scale: NA  Past Medical History  Past Medical History  Diagnosis Date  . Meniere's disease     Family History  family history includes Hypertension in his father and mother. There is no history of Colon cancer or Prostate cancer.  Prior Rehab/Hospitalizations Has the patient had major surgery during 100 days prior to admission? No Patient has never been hospitalized except for tonsillectomy as a 71 yo child.  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 Very independent and works a lot.   Prior Functional Level Self Care: Did the patient need help bathing, dressing, using the toilet or eating? Independent  Indoor Mobility: Did the patient need assistance with  walking from room to room (  with or without device)? Independent  Stairs: Did the patient need assistance with internal or external stairs (with or without device)? Independent  Functional Cognition: Did the patient need help planning regular tasks such as shopping or remembering to take medications? [place] / Equipment none  Prior Device Use: Indicate devices/aids used by the patient prior to current illness, exacerbation or injury? None of the above   Prior Functional Level Current Functional Level  Bed Mobility  Independent  Mod assist (min assist rolling, mod assist for supine to edge of bed)   Transfers  Independent  Mod assist   Mobility - Walk/Wheelchair  Independent  Mod assist (mod assist RW 24 feet)   Upper Body Dressing  Independent  Mod assist (mod to max assist)   Lower Body Dressing  Independent  Total assist   Grooming  Independent  Max assist   Eating/Drinking  Independent  Min assist   Toilet Transfer  Independent  Mod assist   Bladder Continence   continent  continent   Bowel Management  continent  continent   Stair Climbing      (not attempted)   Communication  independent  independent; easily frustrated   Memory  intact  intact   Cooking/Meal Prep  independent     Housework  independent    Money Management  independent    Driving         Special needs/care consideration special : Bed overlay mattress Skin 4/13 [facility] RN skin assessment #1 r heel 2 x 1.5 DTI #2 l heel 3.1 x 5 DTI #3 IAD B buttocks/perineum #4 l 1-5 fingers #5 r 1- 5  fingers #6 r foot #7 r plantar foot #8 r dorsal foot #9 l anterior ankle #10 l dorsal 1-5 toes dorsal and plantar #1 and #2 purple nonblanching intact area with drainage noted. #3 tender reddened area with slight peeling. No itching. #4-#10 necrotic areas noted with h/ovasopressors. Currently 2t pulses x 4. Denies pain. Plan to offload at all times. For #1 and #2. #3 barrier cream to protect and repel. Encouraged to offload. #4-#10 monitor and report changes. Apply xeroform b toes to provide moisture. Prevention protocol initiated.  Bowel mgmt: continent Bladder mgmt:continent Lymphadema wraping performed daily by OT Pt has never been hospitalized before except for as a 18 yo child for tonsilectomy Very poor appetite now, but very picky eater pta  Previous Home Environment Living Arrangements: Spouse/significant other, Children (wife and teenage daughter, 59 yo son also at home for the su) Lives With: Spouse, Son, Daughter Available Help at Discharge: Family, Available 24 hours/day (son not working and able. Wife has taken fmla butplans to re) Type of Home: House Home Layout: Two level, Able to live on main level with bedroom/bathroom Alternate Level Stairs-Rails: Right Alternate Level Stairs-Number of Steps: flight  Home Access: Stairs to enter Entrance Stairs-Rails: None Entrance Stairs-Number of Steps: 3 Bathroom Shower/Tub: Multimedia programmer: Standard Bathroom Accessibility: Yes How Accessible: Accessible via  walker [place]: No Wife works as Secretary/administrator in school system and plans to return to work. 29 yo son, home for the summer now and available to help when wife works  Discharge Living Setting Plans for Discharge Living Setting: Patient's home, Lives with (comment) (wife and two children) Type of Home at Discharge: House Discharge Home Layout: Two level, Able to live on main level with bedroom/bathroom Alternate Level Stairs-Rails: Right Alternate Level Stairs-Number of Steps: flight Discharge Home Access: Stairs to  enter Entrance Stairs-Rails: None Entrance Stairs-Number of Steps: 3 Discharge Bathroom Shower/Tub: Horticulturist, commercial: Standard Discharge Bathroom Accessibility: Yes How Accessible: Accessible via walker Does the patient have any problems obtaining your medications?: No  Social/Family/Support Systems Patient Roles: Spouse, Parent, 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 74 yo son to assist Caregiver Availability: 24/7 Discharge Plan Discussed with Primary Caregiver: Yes Is Caregiver In Agreement with Plan?: Yes Does Caregiver/Family have Issues with Lodging/Transportation while Pt is in Rehab?: No  Goals/Additional Needs Patient/Family Goal for Rehab: supervision PT, supervision to min OT, supervision SLP Expected length of stay: ELOS 10- 14 days Pt/Family Agrees to Admission and willing to participate: Yes Program Orientation Provided & Reviewed with Pt/Caregiver Including Roles & Responsibilities: Yes  Patient Condition: I have reviwed the medical records from [name] spoke with SW and met with pt and his wife at bedside for assessment. Patient needs ongoing PT  and OT and will benefit from a coordinated team approach with rehabilitation MD and nursing care. He will receive three hours of therpay a day. He is currently mod to max assist with adls and mod assist with short distances of ambulation. We will admit to inpatient acute rehabilitation today.   Preadmission Screen Completed By: Cleatrice Burke, 09/24/2015 10:41 AM ______________________________________________________________________  Discussed status with Dr. Posey Pronto on 09/24/2015 at 48 and received telephone approval for admission today.  Admission Coordinator: Cleatrice Burke, time 1751 Date 09/24/2015   Assessment/Plan: Diagnosis:Debility, acute renal failure  1. Does the need for close, 24 hr/day Medical supervision in concert with the patient's rehab needs make it unreasonable for this patient to be served in a less intensive setting? Yes  2. Co-Morbidities requiring supervision/potential complications: AKI (avoid nephrotoxic meds), AMS (appears to have resolved, continue to monitor), low grade fevers (continue to monitor), lower lobe pneumonia (continue Abx), Bilateral skin necrosis to hands (continue to monitor her wounds)  3. Due to safety, skin/wound care, disease management, medication administration, pain management and patient education, does the patient require 24 hr/day rehab nursing? Yes 4. Does the patient require coordinated care of a physician, rehab nurse, PT (1-2 hrs/day, 5 days/week) and OT (1-2 hrs/day, 5 days/week) to address physical and functional deficits in the context of the above medical diagnosis(es)? Yes Addressing deficits in the following areas: balance, endurance, locomotion, strength, transferring, bathing, dressing, toileting and psychosocial support 5. Can the patient actively participate in an intensive therapy program of at least 3 hrs of therapy 5 days a week? Yes 6. The potential for patient to make measurable gains while on inpatient  rehab is excellent 7. Anticipated functional outcomes upon discharge from inpatients are: min assist PT, min assist OT, n/a SLP 8. Estimated rehab length of stay to reach the above functional goals is: 16-20 days. 9. Does the patient have adequate social supports to accommodate these discharge functional goals? Yes 10. Anticipated D/C setting: Home 11. Anticipated post D/C treatments: HH therapy and Home excercise program 12. Overall Rehab/Functional Prognosis: excellent   RECOMMENDATIONS: This patient's condition is appropriate for continued rehabilitative care in the following setting: CIR Patient has agreed to participate in recommended program. Yes Note that insurance prior authorization may be required for reimbursement for recommended care.  Comment:  Danne Baxter  [place] 09/24/2015  Delice Lesch, MD 09/24/15      Cosigned by: Ankit Lorie Phenix, MD at 09/24/2015 4:33 PM  Revision History     Date/Time User Provider Type Action   09/24/2015 4:33 PM Ankit Lorie Phenix, MD Physician Cosign   09/24/2015 12:44 PM Ankit Lorie Phenix, MD Physician Sign   09/24/2015 12:30 PM Cristina Gong, RN Rehab Admission Coordinator Share   09/24/2015 10:51 AM Cristina Gong, RN Rehab Admission Coordinator Share   09/21/2015 4:12 PM Cristina Gong, RN Rehab Admission Coordinator Share   View Details Report

## 2015-09-25 ENCOUNTER — Inpatient Hospital Stay (HOSPITAL_COMMUNITY): Payer: BLUE CROSS/BLUE SHIELD | Admitting: Physical Therapy

## 2015-09-25 ENCOUNTER — Inpatient Hospital Stay (HOSPITAL_COMMUNITY): Payer: BLUE CROSS/BLUE SHIELD

## 2015-09-25 LAB — IRON AND TIBC
IRON: 43 ug/dL — AB (ref 45–182)
SATURATION RATIOS: 23 % (ref 17.9–39.5)
TIBC: 189 ug/dL — AB (ref 250–450)
UIBC: 146 ug/dL

## 2015-09-25 LAB — CBC WITH DIFFERENTIAL/PLATELET
Basophils Absolute: 0.1 10*3/uL (ref 0.0–0.1)
Basophils Relative: 1 %
EOS PCT: 3 %
Eosinophils Absolute: 0.2 10*3/uL (ref 0.0–0.7)
HEMATOCRIT: 24.6 % — AB (ref 39.0–52.0)
Hemoglobin: 7.6 g/dL — ABNORMAL LOW (ref 13.0–17.0)
LYMPHS PCT: 38 %
Lymphs Abs: 2.1 10*3/uL (ref 0.7–4.0)
MCH: 29 pg (ref 26.0–34.0)
MCHC: 30.9 g/dL (ref 30.0–36.0)
MCV: 93.9 fL (ref 78.0–100.0)
MONO ABS: 0.7 10*3/uL (ref 0.1–1.0)
MONOS PCT: 12 %
NEUTROS ABS: 2.6 10*3/uL (ref 1.7–7.7)
Neutrophils Relative %: 46 %
PLATELETS: 241 10*3/uL (ref 150–400)
RBC: 2.62 MIL/uL — ABNORMAL LOW (ref 4.22–5.81)
RDW: 16.6 % — AB (ref 11.5–15.5)
WBC: 5.6 10*3/uL (ref 4.0–10.5)

## 2015-09-25 LAB — VITAMIN B12: Vitamin B-12: 998 pg/mL — ABNORMAL HIGH (ref 180–914)

## 2015-09-25 LAB — COMPREHENSIVE METABOLIC PANEL
ALT: 21 U/L (ref 17–63)
AST: 22 U/L (ref 15–41)
Albumin: 2.1 g/dL — ABNORMAL LOW (ref 3.5–5.0)
Alkaline Phosphatase: 51 U/L (ref 38–126)
Anion gap: 8 (ref 5–15)
BUN: 31 mg/dL — ABNORMAL HIGH (ref 6–20)
CHLORIDE: 111 mmol/L (ref 101–111)
CO2: 23 mmol/L (ref 22–32)
Calcium: 8.5 mg/dL — ABNORMAL LOW (ref 8.9–10.3)
Creatinine, Ser: 2.04 mg/dL — ABNORMAL HIGH (ref 0.61–1.24)
GFR, EST AFRICAN AMERICAN: 42 mL/min — AB (ref >60)
GFR, EST NON AFRICAN AMERICAN: 36 mL/min — AB (ref >60)
Glucose, Bld: 97 mg/dL (ref 65–99)
POTASSIUM: 3.5 mmol/L (ref 3.5–5.1)
SODIUM: 142 mmol/L (ref 135–145)
Total Bilirubin: 0.5 mg/dL (ref 0.3–1.2)
Total Protein: 5.3 g/dL — ABNORMAL LOW (ref 6.5–8.1)

## 2015-09-25 LAB — CULTURE, BLOOD (ROUTINE X 2)
CULTURE: NO GROWTH
Culture: NO GROWTH

## 2015-09-25 LAB — RETICULOCYTES
RBC.: 2.62 MIL/uL — ABNORMAL LOW (ref 4.22–5.81)
RETIC COUNT ABSOLUTE: 28.8 10*3/uL (ref 19.0–186.0)
Retic Ct Pct: 1.1 % (ref 0.4–3.1)

## 2015-09-25 LAB — FERRITIN: FERRITIN: 293 ng/mL (ref 24–336)

## 2015-09-25 LAB — FOLATE: FOLATE: 37.2 ng/mL (ref >5.9)

## 2015-09-25 MED ORDER — FERROUS SULFATE 325 (65 FE) MG PO TABS
325.0000 mg | ORAL_TABLET | Freq: Two times a day (BID) | ORAL | Status: DC
  Administered 2015-09-25 – 2015-10-05 (×20): 325 mg via ORAL
  Filled 2015-09-25 (×21): qty 1

## 2015-09-25 NOTE — Plan of Care (Signed)
Problem: RH Bed Mobility Goal: LTG Patient will perform bed mobility with assist (PT) LTG: Patient will perform bed mobility with assistance, with/without cues (PT). Without hospital bed features  Problem: RH Bed to Chair Transfers Goal: LTG Patient will perform bed/chair transfers w/assist (PT) LTG: Patient will perform bed/chair transfers with assistance, with/without cues (PT). With LRAD  Problem: RH Car Transfers Goal: LTG Patient will perform car transfers with assist (PT) LTG: Patient will perform car transfers with assistance (PT). With LRAD  Problem: RH Furniture Transfers Goal: LTG Patient will perform furniture transfers w/assist (OT/PT LTG: Patient will perform furniture transfers with assistance (OT/PT). With LRAD  Problem: RH Ambulation Goal: LTG Patient will ambulate in controlled environment (PT) LTG: Patient will ambulate in a controlled environment, # of feet with assistance (PT). 150 ft with LRAD Goal: LTG Patient will ambulate in home environment (PT) LTG: Patient will ambulate in home environment, # of feet with assistance (PT). 50 ft with LRAD Goal: LTG Patient will ambulate in community environment (PT) LTG: Patient will ambulate in community environment, # of feet with assistance (PT). 150 ft with LRAD  Problem: RH Wheelchair Mobility Goal: LTG Patient will propel w/c in controlled environment (PT) LTG: Patient will propel wheelchair in controlled environment, # of feet with assist (PT) 200 ft for strengthening & cardiovascular endurance training  Problem: RH Stairs Goal: LTG Patient will ambulate up and down stairs w/assist (PT) LTG: Patient will ambulate up and down # of stairs with assistance (PT) Full flight of stairs with 1 rail

## 2015-09-25 NOTE — Progress Notes (Signed)
Social Work  Social Work Assessment and Plan  Patient Details  Name: John Hoover MRN: [id number] Date of Birth: [birth year]  Today's Date: 09/25/2015  Problem List:  Patient Active Problem List   Diagnosis Date Noted  . Debility 09/24/2015  . Weakness   . Abnormality of gait   . Pain   . Neuropathic pain   . Anxiety state   . Yeast infection   . Anemia of chronic disease   . Hypokalemia   . Peripheral edema   . ATN (acute tubular necrosis) (HCC)   . Scrotal edema   . Thrombocytopenia (HCC)   . MODS (multiple organ dysfunction syndrome)   . Endotracheally intubated   . Hypovolemic shock (HCC)   . Acute respiratory failure (HCC)   . AKI (acute kidney injury) (HCC)   . Lactic acidosis   . Hypoxia 08/27/2015  . Rhabdomyolysis 08/27/2015  . Hyperkalemia 08/27/2015  . ARF (acute renal failure) (HCC) 08/27/2015  . Routine general medical examination at a health care facility 08/23/2014  . Advance care planning 08/23/2014  . Meniere disease 04/08/2011  . ALLERGIC RHINITIS, SEASONAL 12/02/2007   Past Medical History:  Past Medical History  Diagnosis Date  . Meniere's disease    Past Surgical History:  Past Surgical History  Procedure Laterality Date  . Nasal sinus surgery     Social History:  reports that he has never smoked. He has never used smokeless tobacco. He reports that he drinks alcohol. He reports that he does not use illicit drugs.  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 Caregiver Availability: 24/7 Family  Dynamics: Close knit family who are willing to assist and help their father. They have friends and colleagues who are supportive and visit often. Wife has been off with pt and here providing support till feels she can leave him now while on rehab.  Social History Preferred language: English Religion:  Cultural Background: No issues Education: Automotive engineer Educated Read: Yes Write: Yes Employment Status: Employed Name of Employer: Public house manager Return to Work Plans: Plans to return when able Fish farm manager Issues: No issues Guardian/Conservator: None-according to MD pt is capable of making his own decisions while here. He does include his wife is a decision needs to be made.   Abuse/Neglect Physical Abuse: Denies Verbal Abuse: Denies Sexual Abuse: Denies Exploitation of patient/patient's resources: Denies Self-Neglect: Denies  Emotional Status Pt's affect, behavior adn adjustment status: Pt is glad to be here the last step before going home. He is ready to work in therapies and recover from his illness. He slept well and feels good today but tired from therapies. He has always been independent and wants to get this back and his life back. Recent Psychosocial Issues: had been healthy prior to admission Pyschiatric History: No history deferred depression screen due to pt tired from am therapies and wanting to rest before next therapy. He would benefit from neuro-psych seeing due to length of hospitalization and for coping. Will make referral. Substance Abuse History: No issues  Patient / Family Perceptions, Expectations & Goals Pt/Family understanding of illness & functional limitations: Pt and wife have  a good understanding of his condition and treatment plan while here. They do talk with the MD's and feel their questions and concerns are being addressed. Pt feels everyone has been nice and encouraging. Premorbid pt/family roles/activities: Husband, father, employee,  friend, brother, etc Anticipated changes in roles/activities/participation: resume Pt/family expectations/goals: Pt states: " I want to be able to take care of myself, before I leave here."  Wifef states: " I hope he can do mostly for himself it is all strength."  [employer] IncCommunity Resources Community Agencies: None Premorbid Home Care/DME Agencies: None Transportation available at discharge: family Resource referrals recommended: Neuropsychology, Support group (specify)  Discharge Planning Living Arrangements: Spouse/significant other, Children Support Systems: Spouse/significant other, Children, Other relatives, Friends/neighbors, Psychologist, clinicalChurch/faith community Type of Residence: Private residence Insurance Resources: Media plannerrivate Insurance (specify) (BCBS of [place]) Surveyor, quantityinancial Resources: Employment, Garment/textile technologistamily Support Financial Screen Referred: No Living Expenses: Database administratorMotgage Money Management: Patient, Spouse Does the patient have any problems obtaining your medications?: No Home Management: Both he and wife Patient/Family Preliminary Plans: Return home with wife who will be done with school in early June, but 52 yo son will be there with him until then. Both are hopeful he will not require much care by the time he goes home. Will await team evalutions and discuss with pt and wife. Social Work Anticipated Follow Up Needs: HH/OP, Support Group  Clinical Impression Very pleasant gentleman who is motivated and glad to be here on rehab. His wife has today gone back to work as a school-OT and will be out early June. Their son recently graduated from college and will be with pt until Wife out of school. Pt hopeful he will be fairly independent by discharge from rehab. Would benefit from neuro-psych eval will make referral. Discuss plan after team conference tomorrow, pt anxiously awaiting target discharge date.  Lucy Chrisupree, Jahniah Pallas G 09/25/2015, 11:42 AM

## 2015-09-25 NOTE — Plan of Care (Signed)
Problem: RH Stairs Goal: LTG Patient will ambulate up and down stairs w/assist (PT) LTG: Patient will ambulate up and down # of stairs with assistance (PT) 3 steps without rails with LRAD

## 2015-09-25 NOTE — Evaluation (Signed)
Occupational Therapy Assessment and Plan  Patient Details  Name: John Hoover MRN: [id number] Date of Birth: 1965-11-30  OT Diagnosis: muscle weakness (generalized) Rehab Potential: Rehab Potential (ACUTE ONLY): Good ELOS: 10-14 days   Today's Date: 09/25/2015 OT Time: 0730-0845 75 Minutes    Problem List:  Patient Active Problem List   Diagnosis Date Noted  . Debility 09/24/2015  . Weakness   . Abnormality of gait   . Pain   . Neuropathic pain   . Anxiety state   . Yeast infection   . Anemia of chronic disease   . Hypokalemia   . Peripheral edema   . ATN (acute tubular necrosis) ([place])   . Scrotal edema   . Thrombocytopenia ([place])   . MODS (multiple organ dysfunction syndrome)   . Endotracheally intubated   . Hypovolemic shock ([place])   . Acute respiratory failure ([place])   . AKI (acute kidney injury) ([place])   . Lactic acidosis   . Hypoxia 08/27/2015  . Rhabdomyolysis 08/27/2015  . Hyperkalemia 08/27/2015  . ARF (acute renal failure) ([place]) 08/27/2015  . Routine general medical examination at a health care facility 08/23/2014  . Advance care planning 08/23/2014  . Meniere disease 04/08/2011  . ALLERGIC RHINITIS, SEASONAL 12/02/2007    Past Medical History:  Past Medical History  Diagnosis Date  . Meniere's disease    Past Surgical History:  Past Surgical History  Procedure Laterality Date  . Nasal sinus surgery      Assessment & Plan Clinical Impression: Patient is a 50 y.o. male originally admitted to [facility] on 08/27/15 with malaise and myalgias due to influenza with AKI and rhabdomyolysis. Hospital course complicated with MODS due to septic shock and respiratory failure requiring intubation. He has required CRRT due to renal failure with volume overload as well as pressors due to hypotension. He has also had issues with agitation requiring sedation, heme positive stools, thrombocytopenia, anemia requiring multiple units PRBC, lethargy and deconditioning. Dr.  Delon [place] Onc consulted and work consistent with platelets decline due to sepsis and retic count low due to likely due to suppression. Has been on muliple antibiotics with respiratory cultures growing enterococcus faecalis and stenotrophomonas as well as stress dose steriods. He was transferred to [facility] on 04/13 for rehab. Wife has been assisting with lymphedema wraps to help with peripheral edema. Renal status improving and he has been weaned off oxygen. Patient with fever on 04/20 and pan cultured. Cefepime added due to concerns of PNA and to continue for a week..    Patient transferred to CIR on 09/24/2015 .    Patient currently requires moderate assistance with basic self-care skills secondary to muscle weakness.  Prior to hospitalization, patient could complete BADL and iADL independently.   Patient will benefit from skilled intervention to increase independence with basic self-care skills and increase level of independence with iADL prior to discharge home independently.  Anticipate patient will require intermittent supervision and no further OT follow recommended.  OT - End of Session Activity Tolerance: Tolerates 30+ min activity with multiple rests Endurance Deficit: Yes OT Assessment Rehab Potential (ACUTE ONLY): Good OT Patient demonstrates impairments in the following area(s): Balance;Endurance;Sensory;Safety OT Basic ADL's Functional Problem(s): Eating;Grooming;Bathing;Dressing;Toileting OT Advanced ADL's Functional Problem(s): Simple Meal Preparation;Laundry OT Transfers Functional Problem(s): Toilet;Tub/Shower OT Plan OT Intensity: Minimum of 1-2 x/day, 45 to 90 minutes OT Frequency: 5 out of 7 days OT Duration/Estimated Length of Stay: 10-14 days OT Treatment/Interventions: Balance/vestibular training;Self Care/advanced ADL retraining;Skin care/wound managment;Therapeutic Exercise;Therapeutic Activities;UE/LE Strength  taining/ROM;Functional mobility training;Patient/family  education;DME/adaptive equipment instruction OT Self Feeding Anticipated Outcome(s): Mod I OT Basic Self-Care Anticipated Outcome(s): Mod I OT Toileting Anticipated Outcome(s): Mod I OT Bathroom Transfers Anticipated Outcome(s): Mod I OT Recommendation Patient destination: Home Follow Up Recommendations: None Equipment Recommended: To be determined   Skilled Therapeutic Intervention OT 1:1 initial evaluation completed with treatment provided to address pt ed on methods and goals of treatment, sitting/standing balance, functional mobility using w/c, transfers, adaptive bathing/dressing skills, and energy conservation strategies.   Pt received supine in bed but receptive to treatment.   Pt completed bed mobility using HOB elevated and bedrail with min instructional cues.   Pt completed transfer to w/c from EOB with moderate assist to lift and steadying assist to during stand-pivot transfer.  Pt performs similar transfer to [facility] over toilet and BSC as shower chair in shower.   Pt bathes with supervision and recovers to w/c to dress at sink with RN who arrived at end of session to apply nystatin to pt's buttocks d/t friction sore.    OT Evaluation Precautions/Restrictions  Precautions Precautions: None Restrictions Weight Bearing Restrictions: No  General Chart Reviewed: Yes Family/Caregiver Present: No  Vital Signs Therapy Vitals Temp: 99 F (37.2 C) Temp Source: Oral Pulse Rate: 90 Resp: 17 BP: 135/80 mmHg Patient Position (if appropriate): Lying Oxygen Therapy SpO2: 99 % O2 Device: Not Delivered  Pain Pain Assessment Pain Assessment: No/denies pain  Home Living/Prior Functioning Home Living Available Help at Discharge: Family, Available 24 hours/day Type of Home: House Home Access: Stairs to enter (side entrance) Entrance Stairs-Number of Steps: 3 Entrance Stairs-Rails: None Home Layout: Two level, Able to live on main level with bedroom/bathroom Alternate Level  Stairs-Number of Steps: flight, 18 steps Alternate Level Stairs-Rails: Right (and left although not both) Bathroom Shower/Tub: Estate agent Accessibility: Yes  Lives With: Spouse, Son, Daughter IADL History Homemaking Responsibilities: Yes Meal Prep Responsibility: No Laundry Responsibility: Lexicographer Responsibility: No Bill Paying/Finance Responsibility: Primary Shopping Responsibility: No Child Care Responsibility: No (37 y/o dtr) Homemaking Comments: maintains basement space Current License: Yes Mode of Transportation: Car Education: HS + BS Management/Marketing 1987 Occupation: Full time employment 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 Leisure: Hobbies-no  ADL ADL ADL Comments: see Functional Assessment Tool   Vision/Perception  Vision- History Baseline Vision/History: Wears glasses (Contact lenses) Patient Visual Report: No change from baseline   Cognition Arousal/Alertness: Awake/alert Orientation Level: Person;Place;Situation Person: Oriented Place: Oriented Situation: Oriented Year: 2017 Month: April Day of Week: Correct Memory: Appears intact Immediate Memory Recall: Sock;Blue;Bed Memory Recall: Sock;Blue;Bed Memory Recall Sock: With Cue Memory Recall Blue: With Cue Memory Recall Bed: With Cue Attention: Alternating Awareness: Appears intact Problem Solving: Appears intact Safety/Judgment: Appears intact  Sensation Sensation Light Touch: Impaired Detail Light Touch Impaired Details: Impaired RUE;Impaired LUE;Impaired RLE;Impaired LLE Stereognosis: Appears Intact Hot/Cold: Appears Intact Proprioception: Appears Intact Coordination Gross Motor Movements are  Fluid and Coordinated: Yes Fine Motor Movements are Fluid and Coordinated: No  Motor  Motor Motor: Within Functional Limits  Mobility  Bed Mobility Bed Mobility: Rolling Right;Rolling Left;Supine to Sit;Sit to Supine Rolling Right: 5: Supervision Rolling Left: 5: Supervision (with bed rails) Supine to Sit: 5: Supervision (significantly extra time) Sit to Supine: 5: Supervision (significantly extra time)   Trunk/Postural  Assessment  Cervical Assessment Cervical Assessment: Within Functional Limits Thoracic Assessment Thoracic Assessment: Within Functional Limits Lumbar Assessment Lumbar Assessment: Within Functional Limits Postural Control Postural Control: Within Functional Limits   Balance Balance Balance Assessed: Yes Static Sitting Balance Static Sitting - Balance Support: Feet supported Static Sitting - Level of Assistance: 6: Modified independent (Device/Increase time) Dynamic Sitting Balance Dynamic Sitting - Balance Support: Feet supported Dynamic Sitting - Level of Assistance: 5: Stand by assistance;6: Modified independent (Device/Increase time) Static Standing Balance Static Standing - Balance Support: Bilateral upper extremity supported;During functional activity Static Standing - Level of Assistance: 5: Stand by assistance Dynamic Standing Balance Dynamic Standing - Balance Support: Left upper extremity supported;During functional activity Dynamic Standing - Level of Assistance: 4: Min assist Dynamic Standing - Balance Activities: Reaching for objects;Reaching across midline;Forward lean/weight shifting;Lateral lean/weight shifting   Extremity/Trunk Assessment RUE Assessment RUE Assessment: Within Functional Limits LUE Assessment LUE Assessment: Within Functional Limits   See Function Navigator for Current Functional Status.   Refer to Care Plan for Long Term Goals  Recommendations for other services: None  Discharge Criteria: Patient will be  discharged from OT if patient refuses treatment 3 consecutive times without medical reason, if treatment goals not met, if there is a change in medical status, if patient makes no progress towards goals or if patient is discharged from hospital.  The above assessment, treatment plan, treatment alternatives and goals were discussed and mutually agreed upon: by patient  [facility] 09/25/2015, 2:52 PM

## 2015-09-25 NOTE — IPOC Note (Signed)
Overall Plan of Care [facility](IPOC) Patient Details Name: John Hoover MRN: [id number] DOB: 10-18-1965  Admitting Diagnosis: debility  Hospital Problems: Principal Problem:   Debility Active Problems:   Weakness   Abnormality of gait   Pain   Neuropathic pain   Anxiety state   Yeast infection   Anemia of chronic disease   Hypokalemia   Peripheral edema   ATN (acute tubular necrosis) (HCC)     Functional Problem List: Nursing Bladder, Bowel, Edema, Endurance, Nutrition, Pain, Safety, Skin Integrity, Medication Management  PT Balance, Edema, Endurance, Motor, Pain, Perception, Safety, Skin Integrity, Sensory  OT Balance, Endurance, Sensory, Safety  SLP    TR         Basic ADL's: OT Eating, Grooming, Bathing, Dressing, Toileting     Advanced  ADL's: OT Simple Meal Preparation, Laundry     Transfers: PT Bed to Chair, Bed Mobility, Car, Occupational psychologisturniture  OT Toilet, Research scientist (life sciences)Tub/Shower     Locomotion: PT Ambulation, Psychologist, prison and probation servicesWheelchair Mobility, Stairs     Additional Impairments: OT    SLP        TR      Anticipated Outcomes Item Anticipated Outcome  Self Feeding Mod I  Swallowing      Basic self-care  Mod I  Toileting  Mod I   Bathroom Transfers Mod I  Bowel/Bladder  patient will be continent of bowel and bladder  Transfers  supervision  Locomotion  supervision with LRAD  Communication     Cognition     Pain  pain less than or equal to 3/10  Safety/Judgment  paient will remain free from falls/injury and display sound safety judgement   Therapy Plan: PT Intensity: Minimum of 1-2 x/day ,45 to 90 minutes PT Frequency: 5 out of 7 days PT Duration Estimated Length of Stay: 10-14 days OT Intensity: Minimum of 1-2 x/day, 45 to 90 minutes OT Frequency: 5 out of 7 days OT Duration/Estimated Length of Stay: 10-14 days         Team Interventions: Nursing Interventions Patient/Family Education, Bladder Management, Bowel Management, Pain Management, Medication Management, Skin  Care/Wound Management, Discharge Planning  PT interventions Ambulation/gait training, Balance/vestibular training, Community reintegration, Functional electrical stimulation, DME/adaptive equipment instruction, Discharge planning, Functional mobility training, Neuromuscular re-education, Pain management, Patient/family education, Stair training, Splinting/orthotics, Skin care/wound management, Psychosocial support, Therapeutic Activities, Therapeutic Exercise, UE/LE Strength taining/ROM, UE/LE Coordination activities, Wheelchair propulsion/positioning  OT Interventions Balance/vestibular training, Self Care/advanced ADL retraining, Skin care/wound managment, Therapeutic Exercise, Therapeutic Activities, UE/LE Strength taining/ROM, Functional mobility training, Patient/family education, DME/adaptive equipment instruction  SLP Interventions    TR Interventions    SW/CM Interventions Discharge Planning, Psychosocial Support, Patient/Family Education    Team Discharge Planning: Destination: PT-Home ,OT- Home , SLP-  Projected Follow-up: PT-Home health PT, Outpatient PT (HHPT versus OPPT), OT-  None, SLP-  Projected Equipment Needs: PT-Rolling walker with 5" wheels, OT- To be determined, SLP-  Equipment Details: PT- , OT-  Patient/family involved in discharge planning: PT- Patient,  OT-Patient, SLP-   MD ELOS: 7-11 days Medical Rehab Prognosis:  Excellent Assessment:  50 year old male [facility] with malaise and myalgias due to influenza with AKI and rhabdomyolysis. Hospital course complicated with MODS due to septic shock and respiratory failure requiring intubation. He required CRRT due to renal failure with volume overload as well as pressors due to hypotension. He has also had issues with agitation requiring sedation, heme positive stools, thrombocytopenia, anemia requiring multiple units PRBC, lethargy and deconditioning. Dr. Dewayne Shorterorcoran/Hem  Onc consulted and work consistent with  platelets decline due to sepsis and retic count low due to likely due to suppression. Has been on muliple antibiotics with respiratory cultures growing enterococcus faecalis and stenotrophomonas as well as stress dose steriods. He was transferred to [facility] on 04/13 for rehab. Wife has been assisting with lymphedema wraps to help with peripheral edema. Renal status improving and he has been weaned off oxygen. Patient with fever on 04/20 and pan cultured. Cefepime added due to concerns of PNA.  Focus will be on improving weakness, abnormality of gait, and pain with min assist goals for PT and OT.  See Team Conference Notes for weekly updates to the plan of care

## 2015-09-25 NOTE — Care Management Note (Signed)
Inpatient Rehabilitation Center Individual Statement of Services  Patient Name:  John Hoover  Date:  09/25/2015  Welcome to the Inpatient Rehabilitation Center.  Our goal is to provide you with an individualized program based on your diagnosis and situation, designed to meet your specific needs.  With this comprehensive rehabilitation program, you will be expected to participate in at least 3 hours of rehabilitation therapies Monday-Friday, with modified therapy programming on the weekends.  Your rehabilitation program will include the following services:  Physical Therapy (PT), Occupational Therapy (OT), 24 hour per day rehabilitation nursing, Therapeutic Recreaction (TR), Neuropsychology, Case Management (Social Worker), Rehabilitation Medicine, Nutrition Services and Pharmacy Services  Weekly team conferences will be held on Wednesday to discuss your progress.  Your Social Worker will talk with you frequently to get your input and to update you on team discussions.  Team conferences with you and your family in attendance may also be held.  Expected length of stay: 10-14 days Overall anticipated outcome: supervision/mod/i level  Depending on your progress and recovery, your program may change. Your Social Worker will coordinate services and will keep you informed of any changes. Your Social Worker's name and contact numbers are listed  below.  The following services may also be recommended but are not provided by the Inpatient Rehabilitation Center:   Driving Evaluations  Home Health Rehabiltiation Services  Outpatient Rehabilitation Services  Vocational Rehabilitation   Arrangements will be made to provide these services after discharge if needed.  Arrangements include referral to agencies that provide these services.  Your insurance has been verified to be:  [employer] [place] Your primary doctor is:  Crawford GivensGraham Duncan  Pertinent information will be shared with your doctor and your  insurance company.  Social Worker:  Dossie DerBecky Sharia [place], SW [phone] or [phone]) [phone]  Information discussed with and copy given to patient by: Lucy Chrisupree, Celisse Ciulla G, 09/25/2015, 11:16 AM

## 2015-09-25 NOTE — Progress Notes (Signed)
[place] PHYSICAL MEDICINE & REHABILITATION     PROGRESS NOTE  Subjective/Complaints:  Patient seen sitting up in his chair this morning. He states he slept better here than he has previously. He is ready therapies to begin.  ROS: denies CP, SOB, nausea, vomiting, diarrhea  Objective: Vital Signs: Blood pressure 135/80, pulse 90, temperature 99 F (37.2 C), temperature source Oral, resp. rate 17, height 6\' 1"  (1.854 m), weight 90.084 kg (198 lb 9.6 oz), SpO2 99 %. No results found.  Recent Labs  09/24/15 0624 09/25/15 0728  WBC 5.4 5.6  HGB 7.5* 7.6*  HCT 24.1* 24.6*  PLT 202 241    Recent Labs  09/23/15 0728 09/24/15 0624  NA 143 141  K 3.5 3.4*  CL 110 107  GLUCOSE 94 91  BUN 44* 39*  CREATININE 2.98* 2.45*  CALCIUM 8.2* 8.3*   CBG (last 3)  No results for input(s): GLUCAP in the last 72 hours.  Wt Readings from Last 3 Encounters:  09/25/15 90.084 kg (198 lb 9.6 oz)  09/12/15 100.5 kg (221 lb 9 oz)  08/27/15 91.899 kg (202 lb 9.6 oz)    Physical Exam:  BP 135/80 mmHg  Pulse 90  Temp(Src) 99 F (37.2 C) (Oral)  Resp 17  Ht 6\' 1"  (1.854 m)  Wt 90.084 kg (198 lb 9.6 oz)  BMI 26.21 kg/m2  SpO2 99% Constitutional: He appears well-developed and well-nourished. No distress.  HENT: Normocephalic and atraumatic.  Eyes: Conjunctivae and EOM are normal.   Cardiovascular: Normal rate and regular rhythm.  Respiratory: Effort normal. No respiratory distress. Clear to ausculation.  GI: Soft. Bowel sounds are normal. He exhibits no distension. There is no tenderness.  Musculoskeletal: He exhibits edema and tenderness.  1+ edema left elbow   2 + edema with bilateral lower shins and ankles.  Neurological: He is alert and oriented to person, place, and time.  Motor: 4+/5 throughout, except bilateral ankle dorsi/plantar flexion 3-/5  Skin: Skin is warm and dry. No rash noted.  Ischemic changes to bilateral toes and fingertips  Psychiatric: His behavior is  normal. Thought content normal. His mood appears anxious. Cognition and memory are normal.  Assessment/Plan: 1. Functional deficits secondary to debility which require 3+ hours per day of interdisciplinary therapy in a comprehensive inpatient rehab setting. Physiatrist is providing close team supervision and 24 hour management of active medical problems listed below. Physiatrist and rehab team continue to assess barriers to discharge/monitor patient progress toward functional and medical goals.  Function:  Bathing Bathing position      Bathing parts      Bathing assist        Upper Body Dressing/Undressing Upper body dressing                    Upper body assist        Lower Body Dressing/Undressing Lower body dressing                                  Lower body assist        Toileting Toileting Toileting activity did not occur: Safety/medical concerns        Toileting assist     Transfers Chair/bed Company secretarytransfer             Locomotion Ambulation           Wheelchair          Cognition  Comprehension Comprehension assist level: Follows complex conversation/direction with no assist  Expression Expression assist level: Expresses complex 90% of the time/cues < 10% of the time  Social Interaction Social Interaction assist level: Interacts appropriately with others - No medications needed.  Problem Solving Problem solving assist level: Solves complex problems: With extra time  Memory Memory assist level: More than reasonable amount of time    Medical Problem List and Plan: 1. Weakness, abnormality of gait, pain secondary to Debility.   Begin CIR 2. DVT Prophylaxis/Anticoagulation: Pharmaceutical: Lovenox 3. Pain Management: Cont Neurontin to help with neuropathic pain which is also causing a lot of anxiety.   Continue oxycodone prn.    Educated on spreading out narcotics.  4. Mood: Team to provide ego support.   Cont low dose xanax  for prn use.  5. Neuropsych: This patient is capable of making decisions on his own behalf. 6. Skin/Wound Care: Educated on pressure relief measures.   Cont nystatin cream to peri area and dose of diflucan 7. Fluids/Electrolytes/Nutrition: Monitor I/O.   Check lytes in am.   Wife advised to bring food from home.  8. Anemia of critical illness:   7.6 on 4/25    Will start iron supplement  Cont to monitor 9. Hypokalemia: continue Magnesium supplement. Low dose supplement added.   K+ 3.5 on 4/25 10. Peripheral edema: Diurese prn. Patient and wife educated on monitoring salt intake. Protein supplement added due to low protein stores.  11. Acute renal failure due to ATN: avoid nephrotoxic medications. Monitor with serial checks--MWF until renal status normalizes.    Cr. 2.04 on 4/25 12. Yeast infection: Treated  LOS (Days) 1 A FACE TO FACE EVALUATION WAS PERFORMED  Ankit Karis Juba 09/25/2015 8:59 AM

## 2015-09-25 NOTE — Evaluation (Signed)
Physical Therapy Assessment and Plan  Patient Details  Name: John Hoover MRN: [id number] Date of Birth: Apr 26, 1966  PT Diagnosis: Abnormality of gait, Difficulty walking, Impaired sensation and Muscle weakness Rehab Potential: Good ELOS: 10-14 days   Today's Date: 09/25/2015 PT Individual Time: 1001-1058 and 1600 - 1659 PT Individual Time Calculation (min): 57 min  And 59 minutes  Problem List:  Patient Active Problem List   Diagnosis Date Noted  . Debility 09/24/2015  . Weakness   . Abnormality of gait   . Pain   . Neuropathic pain   . Anxiety state   . Yeast infection   . Anemia of chronic disease   . Hypokalemia   . Peripheral edema   . ATN (acute tubular necrosis) (HCC)   . Scrotal edema   . Thrombocytopenia (HCC)   . MODS (multiple organ dysfunction syndrome)   . Endotracheally intubated   . Hypovolemic shock (HCC)   . Acute respiratory failure (HCC)   . AKI (acute kidney injury) (HCC)   . Lactic acidosis   . Hypoxia 08/27/2015  . Rhabdomyolysis 08/27/2015  . Hyperkalemia 08/27/2015  . ARF (acute renal failure) (HCC) 08/27/2015  . Routine general medical examination at a health care facility 08/23/2014  . Advance care planning 08/23/2014  . Meniere disease 04/08/2011  . ALLERGIC RHINITIS, SEASONAL 12/02/2007    Past Medical History:  Past Medical History  Diagnosis Date  . Meniere's disease    Past Surgical History:  Past Surgical History  Procedure Laterality Date  . Nasal sinus surgery      Assessment & Plan Clinical Impression: Patient is a 50 y.o. year old male originally admitted to [facility] on 08/27/15 with malaise and myalgias due to influenza with AKI and rhabdomyolysis. Hospital course complicated with MODS due to septic shock and respiratory failure requiring intubation. He has required CRRT due to renal failure with volume overload as well as pressors due to hypotension. He has also had issues with agitation requiring sedation, heme positive  stools, thrombocytopenia, anemia requiring multiple units PRBC, lethargy and deconditioning. Dr. Dewayne Shorter Onc consulted and work consistent with platelets decline due to sepsis and retic count low due to likely due to suppression. Has been on muliple antibiotics with respiratory cultures growing enterococcus faecalis and stenotrophomonas as well as stress dose steriods. He was transferred to [facility] on 04/13 for rehab. Wife has been assisting with lymphedema wraps to help with peripheral edema. Renal status improving and he has been weaned off oxygen. Patient with fever on 04/20 and pan cultured. Cefepime added due to concerns of PNA and to continue for a week. Therapy ongoing and patient showing improvement in activity tolerance. CIR recommended for follow up therapy. Patient transferred to CIR on 09/24/2015 .   Patient currently requires mod with mobility secondary to muscle weakness, decreased cardiorespiratoy endurance, decreased coordination and decreased sitting balance, decreased standing balance and decreased balance strategies.  Prior to hospitalization, patient was independent  with mobility and lived with Spouse, Son in a House home.  Home access is  (1 step at front entry without rails, 3 steps at back entry without rails)Stairs to enter.  Patient will benefit from skilled PT intervention to maximize safe functional mobility, minimize fall risk and decrease caregiver burden for planned discharge home with 24 hour supervision.  Anticipate patient will benefit from follow up HH  [facility] OPPT at discharge.  PT - End of Session Activity Tolerance: Tolerates 30+ min activity with multiple rests Endurance Deficit: Yes Endurance  Deficit Description: 2/2 fatigue PT Assessment Rehab Potential (ACUTE/IP ONLY): Good PT Patient demonstrates impairments in the following area(s): Balance;Edema;Endurance;Motor;Pain;Perception;Safety;Skin Integrity;Sensory PT Transfers Functional Problem(s): Bed to  Chair;Bed Mobility;Car;Furniture PT Locomotion Functional Problem(s): Ambulation;Wheelchair Mobility;Stairs PT Plan PT Intensity: Minimum of 1-2 x/day ,45 to 90 minutes PT Frequency: 5 out of 7 days PT Duration Estimated Length of Stay: 10-14 days PT Treatment/Interventions: Ambulation/gait training;Balance/vestibular training;Community reintegration;Functional electrical stimulation;DME/adaptive equipment instruction;Discharge planning;Functional mobility training;Neuromuscular re-education;Pain management;Patient/family education;Stair training;Splinting/orthotics;Skin care/wound management;Psychosocial support;Therapeutic Activities;Therapeutic Exercise;UE/LE Strength taining/ROM;UE/LE Coordination activities;Wheelchair propulsion/positioning PT Transfers Anticipated Outcome(s): supervision PT Locomotion Anticipated Outcome(s): supervision with LRAD PT Recommendation Follow Up Recommendations: Home health PT;Outpatient PT (HHPT versus OPPT) Patient destination: Home Equipment Recommended: Rolling walker with 5" wheels  Skilled Therapeutic Intervention Treatment 1: Pt received in w/c & agreeable to PT. Pt reported 3/10 numbness & pain in fingers & toes. PT observed necrotic tissue on pt's hands & feet. Pt presents with impaired sensation in legs, distal to calves but intact proprioception. Pt able to complete squat pivot w/c>bed with modA& total A for w/c set up. Once in bed pt able to roll L with use of bed rail and roll R & supine>sit without rails and supervision. Completed stand pivot bed>w/c with Min A & cuing for hand placement & sequencing. Attempted to have pt self-propel w/c from room with BUE pt pt reported significant pain in R wrist at IV site during task. PT transported pt to ortho gym via w/c total A & educated pt on car transfer. Pt reported nausea & vomited, but then stated he wanted to continue doing activity. PT educated pt on need to notify nurse & pt returned to room in w/c via  total A & RN notified. Pt performed squat pivot w/c>bed with ModA & transferred to supine position. Pt left with BLE elevated on pillows & all needs within reach. Pt presents with decreased strength, decreased endurance, impaired balance, & decreased awareness of deficits. PT also observed what appears to be beats of clonus in B ankles. 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 Pt also reports he has favored RUE & RLE his entire life (pt is R hand dominant). Pt also reports suffering from Meniere's disease but that it has not affected him in awhile.   Treatment 2: Pt received in bed, with son Will present who excused himself upon PT arrival. Pt c/o 4-5/10 finger & toes numbness/pain. Pt able to transfer supine>sit with use of bed features & supervision A. PT provided w/c set up & pt completed squat pivot bed>w/c with mod A & cuing for hand placement. Pt able to self propel w/c with BUE x 125 ft with supervision in hallway in controlled environment. In ortho gym  PT adjusted height of car to simulate pt's SUV & positioned by in w/c by car. Pt completed squat pivot w/c<>car with Mod A with pt grabbing car door when transferring back to w/c. PT educated pt on need to push on seat as car door will move and can cause pt to lose balance. In main gym pt completed sit<>stand with Min A & ambulated 10 ft with RW & Min A, then removed walker & pt able to ambulate 10 ft without AD with Max A. During all gait training pt with very narrow step width, short step length and great difficulty clearing toes 2/2 poor ankle dorsiflexion strength. Gait training over uneven surface, attempted without AD but pt became very  anxious with heavy breathing noted & pt utilized RW to ambulate over uneven surface with Min A but high fall risk 2/2 decreased BLE toe clearance. Pt completed stair negotiation over 8 steps (3 inch step height) with B rails & PT providing cuing for step-to pattern. At  top of steps pt noted he felt shaky but was not dizzy. After task pt immediately ready to attempt next activity & PT instructed pt on need to take rest breaks as needed & conserve energy. Utilized Nu-step for BLE strengthening & endurance training with pt performing Level 1-3 for 12 minutes reporting "Light" activity on BORG RPE scale during task, but noted he could "feel it in his calves" after activity. Pt completed squat pivot nu-step>w/c with Min A & self propelled w/c back to room with BUE & supervision for cardiovascular endurance training. Once in room pt left in w/c set up with meal tray & all needs within reach.   PT Evaluation Precautions/Restrictions Precautions Precautions: Fall Restrictions Weight Bearing Restrictions: No General Chart Reviewed: Yes Response to Previous Treatment: Patient reporting fatigue but able to participate. Family/Caregiver Present: No Vital SignsTherapy Vitals Pulse Rate: 82 BP: (!) 120/91 mmHg Patient Position (if appropriate): Sitting Oxygen Therapy SpO2: 100 % O2 Device: Not Delivered Pulse Oximetry Type: Intermittent Pain Pain Assessment Pain Assessment: 0-10 Pain Score: 3  Pain Location:  (fingers & toes) Pain Orientation:  (BLE & BUE) Pain Descriptors / Indicators:  (numbness) Home Living/Prior Functioning Home Living Living Arrangements: Spouse/significant other;Children Available Help at Discharge: Family;Available 24 hours/day (son home for summer) Type of Home: House Home Access: Stairs to enter [employer] of Steps:  (1 step at front entry without rails, 3 steps at back entry without rails) Entrance Stairs-Rails: None Home Layout: Multi-level (pt has main level, 2nd floor & basement) Alternate Level Stairs-Number of Steps:  (full flight with ascending R rail to access 2nd floor, full flight with descending R rail to access basement)  Lives With: Spouse;Son Prior Function Level of Independence: Independent with basic  ADLs;Independent with homemaking with ambulation;Independent with transfers;Independent with gait  Able to Take Stairs?: Reciprically Driving: Yes Vocation: Full time employment 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[employer], walks a lot at work) Leisure: Hobbies-yes (Comment) Comments:  (yardwork (mowing, etc); pt likes to play golf but has not done so in a long time)  Cognition Overall Cognitive Status: Within Functional Limits for tasks assessed Arousal/Alertness: Awake/alert Orientation Level: Oriented X4 Memory: Appears intact Behaviors:  (very motivated) Safety/Judgment: Appears intact Sensation Sensation Light Touch: Impaired by gross assessment (diminished sensation in BLE distal to calves) Proprioception: Appears Intact (intact in fingers & toes) Coordination Finger Nose Finger Test:  (decreased coordination LUE) Heel Shin Test:  (unable to perform 2/2 BLE muscle weakness) Motor  Motor Motor - Skilled Clinical Observations:  (performed quick stretch & slow ROM for ankle dorsiflexion; PT observed what appeared to be beats of clonus in BLE)  Mobility Bed Mobility Bed Mobility: Rolling Right;Rolling Left;Supine to Sit;Sit to Supine Rolling Right: 5: Supervision Rolling Left: 5: Supervision (with bed rails) Supine to Sit: 5: Supervision (significantly extra time) Sit to Supine: 5: Supervision (significantly extra time) Transfers Transfers: Yes Stand Pivot Transfers: 3: Mod assist Stand Pivot Transfer Details: Tactile cues for weight shifting;Verbal cues for sequencing;Verbal cues for technique Squat Pivot Transfers: 3: Mod assist Squat Pivot Transfer Details: Tactile cues for weight shifting;Verbal cues for sequencing;Verbal cues for technique Locomotion  Ambulation Ambulation: Yes Ambulation/Gait Assistance: 2: Max assist;4: Min assist (10  ft without AD with Max A + 10 ft with RW & Min A) Assistive device: None;Rolling walker Ambulation/Gait Assistance Details:  Tactile cues for posture;Tactile cues for weight shifting;Verbal cues for technique;Verbal cues for sequencing Gait Gait: Yes Gait Pattern: Narrow base of support;Poor foot clearance - left;Poor foot clearance - right;Decreased step length - left;Decreased step length - right;Decreased weight shift to left;Decreased weight shift to right;Decreased dorsiflexion - left;Decreased dorsiflexion - right (great difficulty clearing BLE toes due to poor strength in BLE dorsiflexors) Stairs / Additional Locomotion Stairs: Yes Stairs Assistance: 4: Min assist Stairs Assistance Details: Verbal cues for technique Stair Management Technique: Two rails Number of Stairs: 8 Height of Stairs: 3 (inches) Wheelchair Mobility Wheelchair Mobility: Yes Wheelchair Assistance: 5: Supervision (controlled environment in hallway) Environmental health practitioner: Both upper extremities Wheelchair Parts Management: Needs assistance   Restaurant manager, fast food Assessed: Yes Static Standing Balance Static Standing - Balance Support: During functional activity;No upper extremity supported Static Standing - Level of Assistance: 4: Min assist  Extremity Assessment  RUE Assessment RUE Assessment: Within Functional Limits LUE Assessment LUE Assessment: Within Functional Limits RLE Assessment RLE Assessment: Exceptions to [facility] RLE AROM (degrees) Overall AROM Right Lower Extremity: Within functional limits for tasks assessed RLE Strength Right Hip Flexion: 4-/5 Right Knee Flexion: 4/5 Right Knee Extension: 4/5 Right Ankle Dorsiflexion: 1/5 (possibly limited by BLE edema) LLE Assessment LLE Assessment: Exceptions to WFL LLE AROM (degrees) Overall AROM Left Lower Extremity: Within functional limits for tasks assessed LLE Strength Left Hip Flexion: 3+/5 Left Knee Flexion: 4/5 Left Knee Extension: 4/5 Left Ankle Dorsiflexion: 1/5 (possibly due to BLE edema)   See Function Navigator for Current Functional Status.   Refer  to Care Plan for Long Term Goals  Recommendations for other services: None  Discharge Criteria: Patient will be discharged from PT if patient refuses treatment 3 consecutive times without medical reason, if treatment goals not met, if there is a change in medical status, if patient makes no progress towards goals or if patient is discharged from hospital.  The above assessment, treatment plan, treatment alternatives and goals were discussed and mutually agreed upon: by patient  Waunita Schooner 09/25/2015, 1:53 PM

## 2015-09-25 NOTE — Progress Notes (Signed)
Patient information reviewed and entered into eRehab system by Washington Whedbee, RN, CRRN, PPS Coordinator.  Information including medical coding and functional independence measure will be reviewed and updated through discharge.    

## 2015-09-26 ENCOUNTER — Inpatient Hospital Stay (HOSPITAL_COMMUNITY): Payer: BLUE CROSS/BLUE SHIELD | Admitting: Occupational Therapy

## 2015-09-26 ENCOUNTER — Inpatient Hospital Stay (HOSPITAL_COMMUNITY): Payer: BLUE CROSS/BLUE SHIELD | Admitting: Physical Therapy

## 2015-09-26 LAB — RENAL FUNCTION PANEL
ANION GAP: 9 (ref 5–15)
Albumin: 2.3 g/dL — ABNORMAL LOW (ref 3.5–5.0)
BUN: 28 mg/dL — ABNORMAL HIGH (ref 6–20)
CHLORIDE: 112 mmol/L — AB (ref 101–111)
CO2: 23 mmol/L (ref 22–32)
CREATININE: 1.96 mg/dL — AB (ref 0.61–1.24)
Calcium: 8.5 mg/dL — ABNORMAL LOW (ref 8.9–10.3)
GFR calc non Af Amer: 38 mL/min — ABNORMAL LOW (ref >60)
GFR, EST AFRICAN AMERICAN: 44 mL/min — AB (ref >60)
GLUCOSE: 96 mg/dL (ref 65–99)
Phosphorus: 3.2 mg/dL (ref 2.5–4.6)
Potassium: 3.8 mmol/L (ref 3.5–5.1)
Sodium: 144 mmol/L (ref 135–145)

## 2015-09-26 NOTE — Progress Notes (Signed)
Physical Therapy Session Note  Patient Details  Name: John Hoover MRN: [id number] Date of Birth: 1965/08/14  Today's Date: 09/26/2015 PT Individual Time: 1045-1130 and 1300-1400 and 1630-1703 PT Individual Time Calculation (min): 45 min and 60 minutes and 33 minutes  Short Term Goals: Week 1:  PT Short Term Goal 1 (Week 1): Pt will be able to complete stand pivot/squat pivot transfers with Min A. PT Short Term Goal 2 (Week 1): Pt will be able to transfer sit<>stand with CGA. PT Short Term Goal 3 (Week 1): Pt will ambulate 50 ft with LRAD & min A.  Skilled Therapeutic Interventions/Progress Updates:    Treatment 1: Pt received in w/c & agreeable to PT, denying pain but then reporting 7/10 nueropathic pain in hands & feet. PT donned pt's socks Total A & offloading shoes. Pt self propelled w/c x 150 ft with BUE for cardiovascular strengthening. PT provided pt with BLE elevating leg rests to assist with edema management. Gait training x 65 ft + 250 ft with RW & Min A. Cuing for increased step width & step length with good carryover from patient. Pt with increased ability to clear feet with offloading shoes donned. Pt self propelled w/c gym>room x 150 ft with BUE & pt left in w/c with all needs within reach.   Treatment 2: Pt received in w/c, agreeable to PT & reporting same neuropathic pain in hands & feet. Pt had not eaten any of lunch tray, only reporting he ate chips. PT thoroughly educated on need to eat to supply body with protein for skin healing, as well as to provide him with energy sources to be able to participate in PT; pt voiced understanding. Pt self propelled w/c 125 room>gym with BUE & after task reported he hit his L hand on w/c wheel & had broken skin. PT observed pt was bleeding from L hand & notified RN who applied bandage. Pt completed Berg balance test & scored 23/56. Patient demonstrates increased fall risk as noted by score of 23/56 on Berg Balance Scale.  (<36= high risk for  falls, close to 100%; 37-45 significant >80%; 46-51 moderate >50%; 52-55 lower >25%). PT educated pt on interpretation of score & high fall risk & pt voiced understanding. Pt very frustrated with current level of function & PT thoroughly educated pt on recovery time, need for continued balance training & strengthening. Utilized Runner, broadcasting/film/video in sitting for BLE strengthening. Progressed from 60 cm/sec to 20 cm/sec with pt completing 4 minutes x 2 with pt reporting 6-7/10 RPE at while set at 20 cm/sec. PT had to educate pt to take rest breaks for energy conservation. Pt transferred sit>stand without assistance from PT & PT educated pt on need to wait for help as well as reeducated pt on Berg balance score & chances of falling when up unassisted.Gait training x 125 ft with RW back to room. Pt left in w/c with BLE on elevating leg rests & all needs within reach.  Treatment 3: Pt received in w/c with wife present & pt agreeable to PT; pt noted 5/10 fingers & toes neuropathic pain. Pt ambulated 125 ft with Min A & RW, with cuing for increasing step width. In gym pt transferred supine on mat table & performed bridging with 5 second hold, ankle pumps and BLE straight leg raises all for 2 sets of 10 reps. Pt with extremely weak ankle dorsiflexors and tried to compensate movement by using quads & other muscles in BLE. Educated pt that  exercise is not focusing on size of movement but on strengthening appropriate muscles. Pt able to activate dorsiflexors for slight ankle dorsiflexion. Gait training 125 ft with RW & Min A back to room with flexion at trunk & cues for pt to shift pelvis forward but unable to do so. At end of session pt left in w/c with BLE elevated & family present.   Therapy Documentation Precautions:  Precautions Precautions: Fall Restrictions Weight Bearing Restrictions: No  Pain: Pain Assessment Pain Assessment: 0-10 Pain Score: 7 Neuropathic pain in hands & feet Pain Intervention(s):  Repositioned, notified RN  Balance: Balance Balance Assessed: Yes Standardized Balance Assessment Standardized Balance Assessment: Berg Balance Test Berg Balance Test Sit to Stand: Able to stand  independently using hands Standing Unsupported: Needs several tries to stand 30 seconds unsupported Sitting with Back Unsupported but Feet Supported on Floor or Stool: Able to sit safely and securely 2 minutes Stand to Sit: Sits independently, has uncontrolled descent Transfers: Able to transfer with verbal cueing and /or supervision Standing Unsupported with Eyes Closed: Able to stand 10 seconds with supervision Standing Ubsupported with Feet Together: Able to place feet together independently and stand for 1 minute with supervision From Standing, Reach Forward with Outstretched Arm: Can reach forward >12 cm safely (5") From Standing Position, Pick up Object from Floor: Able to pick up shoe, needs supervision From Standing Position, Turn to Look Behind Over each Shoulder: Needs assist to keep from losing balance and falling Turn 360 Degrees: Needs assistance while turning Standing Unsupported, Alternately Place Feet on Step/Stool: Needs assistance to keep from falling or unable to try Standing Unsupported, One Foot in Front: Loses balance while stepping or standing Standing on One Leg: Unable to try or needs assist to prevent fall Total Score: 23   See Function Navigator for Current Functional Status.   Therapy/Group: Individual Therapy  Sandi MariscalVictoria M Olyvia Gopal 09/26/2015, 8:06 AM

## 2015-09-26 NOTE — Progress Notes (Signed)
[name] PHYSICAL MEDICINE & REHABILITATION     PROGRESS NOTE  Subjective/Complaints:  Patient seen sitting up in bed eating breakfast this morning. He states he had a very encouraging first day of therapies.  ROS: denies CP, SOB, nausea, vomiting, diarrhea  Objective: Vital Signs: Blood pressure 126/86, pulse 96, temperature 98.9 F (37.2 C), temperature source Oral, resp. rate 16, height 6\' 1"  (1.854 m), weight 89.495 kg (197 lb 4.8 oz), SpO2 95 %. No results found.  Recent Labs  09/24/15 0624 09/25/15 0728  WBC 5.4 5.6  HGB 7.5* 7.6*  HCT 24.1* 24.6*  PLT 202 241    Recent Labs  09/25/15 0728 09/26/15 0537  NA 142 144  K 3.5 3.8  CL 111 112*  GLUCOSE 97 96  BUN 31* 28*  CREATININE 2.04* 1.96*  CALCIUM 8.5* 8.5*   CBG (last 3)  No results for input(s): GLUCAP in the last 72 hours.  Wt Readings from Last 3 Encounters:  09/26/15 89.495 kg (197 lb 4.8 oz)  09/12/15 100.5 kg (221 lb 9 oz)  08/27/15 91.899 kg (202 lb 9.6 oz)    Physical Exam:  BP 126/86 mmHg  Pulse 96  Temp(Src) 98.9 F (37.2 C) (Oral)  Resp 16  Ht 6\' 1"  (1.854 m)  Wt 89.495 kg (197 lb 4.8 oz)  BMI 26.04 kg/m2  SpO2 95% Constitutional: He appears well-developed and well-nourished. No distress.  HENT: Normocephalic and atraumatic.  Eyes: Conjunctivae and EOM are normal.   Cardiovascular: Normal rate and regular rhythm.  Respiratory: Effort normal. No respiratory distress. Clear to ausculation.  GI: Soft. Bowel sounds are normal. He exhibits no distension. There is no tenderness.  Musculoskeletal: He exhibits edema and tenderness.  1+ edema left elbow  (Improving)  2 + edema with bilateral lower shins and ankles.  Neurological: He is alert and oriented to person, place, and time.  Motor: 4+/5 throughout, except bilateral ankle dorsi/plantar flexion 4/5  Skin: Skin is warm and dry. No rash noted.  Ischemic changes to bilateral toes and fingertips  Psychiatric: His behavior is  normal. Thought content normal. His mood appears anxious. Cognition and memory are normal.  Assessment/Plan: 1. Functional deficits secondary to debility which require 3+ hours per day of interdisciplinary therapy in a comprehensive inpatient rehab setting. Physiatrist is providing close team supervision and 24 hour management of active medical problems listed below. Physiatrist and rehab team continue to assess barriers to discharge/monitor patient progress toward functional and medical goals.  Function:  Bathing Bathing position   Position: Shower  Bathing parts Body parts bathed by patient: Right arm, Left arm, Chest, Abdomen, Front perineal area, Buttocks, Right upper leg, Left upper leg, Right lower leg, Left lower leg    Bathing assist        Upper Body Dressing/Undressing Upper body dressing   What is the patient wearing?: Hospital gown                Upper body assist Assist Level: Touching or steadying assistance(Pt > 75%)      Lower Body Dressing/Undressing Lower body dressing                                  Lower body assist        Toileting Toileting Toileting activity did not occur: Safety/medical concerns        Toileting assist     Transfers Chair/bed transfer  Chair/bed transfer method: Stand pivot, Squat pivot Chair/bed transfer assist level: Moderate assist (Pt 50 - 74%/lift or lower) Chair/bed transfer assistive device: Armrests     Locomotion Ambulation     Max distance:  (10 ft with no AD + 10 ft with RW) Assist level: Maximal assist (Pt 25 - 49%)   Wheelchair   Type: Manual Max wheelchair distance: 125 ft Assist Level: Supervision or verbal cues  Cognition Comprehension Comprehension assist level: Follows complex conversation/direction with no assist  Expression Expression assist level: Expresses complex 90% of the time/cues < 10% of the time  Social Interaction Social Interaction assist level: Interacts  appropriately with others - No medications needed.  Problem Solving Problem solving assist level: Solves complex problems: With extra time  Memory Memory assist level: More than reasonable amount of time    Medical Problem List and Plan: 1. Weakness, abnormality of gait, pain secondary to Debility.   Continue CIR 2. DVT Prophylaxis/Anticoagulation: Pharmaceutical: Lovenox 3. Pain Management: Cont Neurontin to help with neuropathic pain which is also causing a lot of anxiety.   Continue oxycodone prn.    Educated on spreading out narcotics.  4. Mood: Team to provide ego support.   Cont low dose xanax for prn use.  5. Neuropsych: This patient is capable of making decisions on his own behalf. 6. Skin/Wound Care: Educated on pressure relief measures.   Cont nystatin cream to peri area and dose of diflucan 7. Fluids/Electrolytes/Nutrition: Monitor I/O.   Check lytes in am.   Wife advised to bring food from home.  8. Anemia of critical illness:   7.6 on 4/25    Will start iron supplement  Cont to monitor 9. Hypokalemia: continue Magnesium supplement. Low dose supplement added.   K+ 3.8 on 4/26 10. Peripheral edema: Diurese prn. Patient and wife educated on monitoring salt intake. Protein supplement added due to low protein stores.  11. Acute renal failure due to ATN: avoid nephrotoxic medications. Monitor with serial checks--MWF until renal status normalizes.    Cr. 1.96 on 4/26 12. Yeast infection: Treated  LOS (Days) 2 A FACE TO FACE EVALUATION WAS PERFORMED  John Hoover John Hoover 09/26/2015 9:50 AM

## 2015-09-26 NOTE — Plan of Care (Signed)
Problem: RH BOWEL ELIMINATION Goal: RH STG MANAGE BOWEL W/MEDICATION W/ASSISTANCE STG Manage Bowel with Medication with Mod Assistance.  Outcome: Progressing Suppository given

## 2015-09-26 NOTE — Patient Care Conference (Signed)
Inpatient RehabilitationTeam Conference and Plan of Care Update Date: 09/26/2015   Time: 2:00 PM    Patient Name: John Hoover      Medical Record Number: [id number]  Date of Birth: 28-Apr-1966 Sex: Male         Room/Bed: 4W13C/4W13C-01 Payor Info: Payor: BLUE CROSS BLUE SHIELD / Plan: BCBS OTHER / Product Type: *No Product type* /    Admitting Diagnosis: debility  Admit Date/Time:  09/24/2015  3:38 PM Admission Comments: No comment available   Primary Diagnosis:  Debility Principal Problem: Debility  Patient Active Problem List   Diagnosis Date Noted  . Debility 09/24/2015  . Weakness   . Abnormality of gait   . Pain   . Neuropathic pain   . Anxiety state   . Yeast infection   . Anemia of chronic disease   . Hypokalemia   . Peripheral edema   . ATN (acute tubular necrosis) (HCC)   . Scrotal edema   . Thrombocytopenia (HCC)   . MODS (multiple organ dysfunction syndrome)   . Endotracheally intubated   . Hypovolemic shock (HCC)   . Acute respiratory failure (HCC)   . AKI (acute kidney injury) (HCC)   . Lactic acidosis   . Hypoxia 08/27/2015  . Rhabdomyolysis 08/27/2015  . Hyperkalemia 08/27/2015  . ARF (acute renal failure) (HCC) 08/27/2015  . Routine general medical examination at a health care facility 08/23/2014  . Advance care planning 08/23/2014  . Meniere disease 04/08/2011  . ALLERGIC RHINITIS, SEASONAL 12/02/2007    Expected Discharge Date: Expected Discharge Date: 10/05/15  Team Members Present: Physician leading conference: Dr. Maryla Morrow Social Worker Present: Dossie Der, LCSW Nurse Present: Ronny Bacon, RN PT Present: Other (comment) Aleda Grana & Antonietta Jewel) OT Present: Roney Mans, OT SLP Present: Jackalyn Lombard, SLP PPS Coordinator present : Tora Duck, RN, CRRN     Current Status/Progress Goal Weekly Team Focus  Medical   Weakness, abnormality of gait, pain secondary to Debility  Improve strength, mobility, endurance,  monitor labs  See above   Bowel/Bladder   Cont x2; LBM 4/22; pt agree to have suppository 4/26 AM  Cont x2 with Min assist  Encourage regular bowel movements    Swallow/Nutrition/ Hydration             ADL's   Mod A transfers, Min A for B & D,   Overall Mod I w/ LRAD  Endurance, AE training for decreased FMC (d/t sensory loss), funcitonal mobility, adapted B & D skills, homemaking   Mobility   supervision for bed mobility with rails, mod a for squat & stand pivot  supervision for transfers, ambulation with LRAD, supervision for balance  strengthening, endurance trianing, transfers   Communication             Safety/Cognition/ Behavioral Observations            Pain   Requiring PRN pain medication during day and HS; primarily for toes  < 4 on 0-10 pain scale  Encourage pt to pre-medicate for therapy    Skin   Nystatin cream to groin; necrotic skin to bilat fingers and toes  No new skin breakdown while on Rehab  Continue monitoring patient skin qshift; keep groin area clean/dry and apply cream as ordered; monitor skin to all fingers and toes      *See Care Plan and progress notes for long and short-term goals.  Barriers to Discharge: Blood loss, hypokalemia, edema, ATN    Possible Resolutions to  Barriers:  monitor labs, avoid nephrotoxic meds, repelte K+    Discharge Planning/Teaching Needs:  Home with son recently graduated from college there while wife works in the school system during the day. Pt will have 24 hr care at discharge.      Team Discussion:  Goals supervision/mod/i level, pt making good progress, but still severely deconditioned and low endurance. Neuropathic pain fingers and toes. Balance issues, pushes himself hard in therapies. Ace wrap his legs for edema.   Revisions to Treatment Plan:  None   Continued Need for Acute Rehabilitation Level of Care: The patient requires daily medical management by a physician with specialized training in physical medicine and  rehabilitation for the following conditions: Daily direction of a multidisciplinary physical rehabilitation program to ensure safe treatment while eliciting the highest outcome that is of practical value to the patient.: Yes Daily medical management of patient stability for increased activity during participation in an intensive rehabilitation regime.: Yes Daily analysis of laboratory values and/or radiology reports with any subsequent need for medication adjustment of medical intervention for : Cardiac problems;Wound care problems;Renal problems  Lucy ChrisDupree, Yasmen Cortner G 09/26/2015, 2:39 PM

## 2015-09-26 NOTE — Progress Notes (Signed)
Social Work Patient ID: John Hoover, male   DOB: 08-16-1965, 50 y.o.   MRN: [id number] Met with pt and wife who was in the room to discuss team conference goals-supervision to mod.i and discharge date 5/5. Pt wants to be back to where he was prior to admission. Discussed this will take time to regain his strength and endurance due to he was very ill. Wife encouraged this and wants home soon but to be ready to go home and medically stable. Pt has done well in the two days he has been on rehab, will see who he is doing next week. Will work on discharge needs.

## 2015-09-26 NOTE — Progress Notes (Signed)
Occupational Therapy Session Note  Patient Details  Name: John Hoover MRN: [id number] Date of Birth: Feb 28, 1966  Today's Date: 09/26/2015 OT Individual Time: 0930-1030 OT Individual Time Calculation (min): 60 min    Short Term Goals: Week 1:  OT Short Term Goal 1 (Week 1): Transfer to toilet with supervision OT Short Term Goal 2 (Week 1): Demonstrate ability to use AE to compensate for impaired FMC at bilateral hands OT Short Term Goal 3 (Week 1): Complete 15 minutes of continuous therex to strengthen bilateral upper/lower body strength and endurance OT Short Term Goal 4 (Week 1): Prepare simple meal using LRAD with min assist OT Short Term Goal 5 (Week 1): Bathe and dress with min assist to perform foot care  Skilled Therapeutic Interventions/Progress Updates: Patient participated in skilled OT for bathing and dressing today as follows:  Supine with bed elevated to edge of bed=distant supervision Bed to w/c transfer=contact guard assist (min A) supervision with use of bed rail for stabilitation W/c to/fr 3:1 plastic seat for shower transfer and via grab bars=Min A Upper body bathing and dressing=distant S Lower body bathing and dressing = mod A with use of reacher to hold wash cloth and doff socks Groomed, including shaver without self cutting at sink seatedwith distant S  Patient required 1 two minute rest break about half way into the bathhing dressing task  Patient c/o of extreme cold during the shower and after he finished dressing.  At the end of the session, He pressed call bell for staff to check his temperature - through not sure of some of his heat is escaping through his necrosis fingers and toes   --_------------------------------------------------------------------- Patient will benefit from more practice of using reacher and don/doff and pants and eventually sockaide to don/doff socks (staff is suggesting no socks or ACE wraps at this time due to less edema, yet poor  blood flow to his LEs and feet and the necrosis in his feet    ----------------------------------------------------------------------------------------------------------------  Therapy Documentation Precautions:  Precautions Precautions: Fall Restrictions Weight Bearing Restrictions: No  Pain: denied  See Function Navigator for Current Functional Status.   Therapy/Group: Individual Therapy  Bud Faceickett, Kellyn Mansfield [facility]als[facility] 09/26/2015, 11:01 AM

## 2015-09-27 ENCOUNTER — Inpatient Hospital Stay (HOSPITAL_COMMUNITY): Payer: BLUE CROSS/BLUE SHIELD

## 2015-09-27 ENCOUNTER — Inpatient Hospital Stay (HOSPITAL_COMMUNITY): Payer: BLUE CROSS/BLUE SHIELD | Admitting: Physical Therapy

## 2015-09-27 MED ORDER — GABAPENTIN 300 MG PO CAPS
300.0000 mg | ORAL_CAPSULE | Freq: Three times a day (TID) | ORAL | Status: DC
  Administered 2015-09-27 – 2015-10-05 (×24): 300 mg via ORAL
  Filled 2015-09-27 (×24): qty 1

## 2015-09-27 NOTE — Progress Notes (Signed)
Physical Therapy Session Note  Patient Details  Name: John Hoover Blow MRN: [id number] Date of Birth: 1965-12-21  Today's Date: 09/27/2015 PT Individual Time: 1030-1100 PT Individual Time Calculation (min): 30 min   Short Term Goals: Week 1:  PT Short Term Goal 1 (Week 1): Pt will be able to complete stand pivot/squat pivot transfers with Min A. PT Short Term Goal 2 (Week 1): Pt will be able to transfer sit<>stand with CGA. PT Short Term Goal 3 (Week 1): Pt will ambulate 50 ft with LRAD & min A.  Skilled Therapeutic Interventions/Progress Updates:    Patient seen with focus of treatment on standing balance.  Seated in w/c in room, stood and ambulated with minguard to therapy gym with RW and in parallel bars participated in standing balance activities to include standing static feet apart no UE support with head turns, then standing with eyes closed 30 sec minguard A 3 x 30 sec, standing tandem eyes open 30 sec x 2 (once with each foot position), then on rocker board with Ant/post orientation and R/L orientation with periods of no UE support and step taps to 4" step touching targets x 1 and x 2 with min UE support.  Patient ambulated back to room and left in w/c with all needs in reach.  Therapy Documentation Precautions:  Precautions Precautions: Fall Restrictions Weight Bearing Restrictions: No General:   Pain: Pain Assessment Pain Assessment: No/denies pain   See Function Navigator for Current Functional Status.   Therapy/Group: Individual Therapy  Elray McgregorCynthia Jordanny Waddington  Potomacyndi Tarus [place], [place] CarolinaPT [phone] 09/27/2015  09/27/2015, 4:23 PM

## 2015-09-27 NOTE — Progress Notes (Signed)
Occupational Therapy Session Note  Patient Details  Name: John Hoover MRN: [id number] Date of Birth: 12/27/1965  Today's Date: 09/27/2015 OT Individual Time:1100-1200 60 Minutes  Short Term Goals: Week 1:  OT Short Term Goal 1 (Week 1): Transfer to toilet with supervision OT Short Term Goal 2 (Week 1): Demonstrate ability to use AE to compensate for impaired FMC at bilateral hands OT Short Term Goal 3 (Week 1): Complete 15 minutes of continuous therex to strengthen bilateral upper/lower body strength and endurance OT Short Term Goal 4 (Week 1): Prepare simple meal using LRAD with min assist OT Short Term Goal 5 (Week 1): Bathe and dress with min assist to perform foot care  Skilled Therapeutic Interventions/Progress Updates: ADL-retraining at shower level with focus on improved mobility, dynamic standing balance, endurance and adapted bathing/dressing skills.   Pt able to ambulate in room using RW to gather clothing and complete transfer BSC in walk-in shower with min guard assist.   Pt demo'd LOB 1 time while standing to remove clothing but performed bathing, sitting and standing using grab bar to stabilize.  Pt recovered to w/c after bathing d/t fatigue, wet floor and impaired sensation presenting fall risk.    Pt dressed using reacher to lace pants and min assist to don non-skid socks after setup to ace wrap bil LE to reduce edema.    Pt left with RN apply nystatin on his buttocks.     Therapy Documentation Precautions:  Precautions Precautions: Fall Restrictions Weight Bearing Restrictions: No   Pain: Pain Assessment Pain Assessment: 0-10 Pain Score: Asleep Pain Type: Acute pain Pain Location: Toe (Comment which one) Pain Orientation: Right;Left Pain Descriptors / Indicators: Aching;Tingling;Numbness Pain Frequency: Constant Pain Onset: On-going Patients Stated Pain Goal: 4 Pain Intervention(s): Medication (See eMAR);Repositioned  ADL: ADL ADL Comments: see  Functional Assessment Tool  See Function Navigator for Current Functional Status.   Therapy/Group: Individual Therapy  John Hoover 09/27/2015, 1:55 AM

## 2015-09-27 NOTE — Plan of Care (Signed)
Problem: RH SKIN INTEGRITY Goal: RH STG SKIN FREE OF INFECTION/BREAKDOWN Patient's skin will remain free from new infection/breakdown while on Rehab.  Outcome: Progressing Necrotic skin improving on fingers and toes

## 2015-09-27 NOTE — Progress Notes (Signed)
[place] PHYSICAL MEDICINE & REHABILITATION     PROGRESS NOTE  Subjective/Complaints:  Patient sitting up in bed this morning. He notes he continues to feel encourage from therapies. He does note some tingling and pins and needles feeling in his fingertips  ROS: + Neuropathic pain. denies CP, SOB, nausea, vomiting, diarrhea  Objective: Vital Signs: Blood pressure 138/71, pulse 100, temperature 98.8 F (37.1 C), temperature source Oral, resp. rate 18, height  (1.854 m), weight 86.9 kg (191 lb 9.3 oz), SpO2 100 %. No results found.  Recent Labs  09/25/15 0728  WBC 5.6  HGB 7.6*  HCT 24.6*  PLT 241    Recent Labs  09/25/15 0728 09/26/15 0537  NA 142 144  K 3.5 3.8  CL 111 112*  GLUCOSE 97 96  BUN 31* 28*  CREATININE 2.04* 1.96*  CALCIUM 8.5* 8.5*   CBG (last 3)  No results for input(s): GLUCAP in the last 72 hours.  Wt Readings from Last 3 Encounters:  09/27/15 86.9 kg (191 lb 9.3 oz)  09/12/15 100.5 kg (221 lb 9 oz)  08/27/15 91.899 kg (202 lb 9.6 oz)    Physical Exam:  BP 138/71 mmHg  Pulse 100  Temp(Src) 98.8 F (37.1 C) (Oral)  Resp 18  Ht  (1.854 m)  Wt 86.9 kg (191 lb 9.3 oz)  BMI 25.28 kg/m2  SpO2 100% Constitutional: He appears well-developed and well-nourished. No distress.  HENT: Normocephalic and atraumatic.  Eyes: Conjunctivae and EOM are normal.   Cardiovascular: Normal rate and regular rhythm.  Respiratory: Effort normal. No respiratory distress. Clear to ausculation.  GI: Soft. Bowel sounds are normal. He exhibits no distension. There is no tenderness.  Musculoskeletal: He exhibits edema and tenderness.  1+ edema left elbow  (Improving)  1 + edema with bilateral lower shins and ankles.  Neurological: He is alert and oriented to person, place, and time.  Motor: 4+/5 throughout, except bilateral ankle dorsi/plantar flexion 4/5  Skin: Skin is warm and dry. No rash noted.  Ischemic changes to bilateral toes and fingertips   Psychiatric: His behavior is normal. Thought content normal. His mood appears anxious. Cognition and memory are normal.  Assessment/Plan: 1. Functional deficits secondary to debility which require 3+ hours per day of interdisciplinary therapy in a comprehensive inpatient rehab setting. Physiatrist is providing close team supervision and 24 hour management of active medical problems listed below. Physiatrist and rehab team continue to assess barriers to discharge/monitor patient progress toward functional and medical goals.  Function:  Bathing Bathing position   Position: Shower  Bathing parts Body parts bathed by patient: Right arm, Left arm, Chest, Abdomen, Front perineal area, Right upper leg, Left upper leg, Back (HE was able to complete these seated but needed assist standing for bttocks) Body parts bathed by helper: Buttocks, Right lower leg, Left lower leg  Bathing assist        Upper Body Dressing/Undressing Upper body dressing   What is the patient wearing?: Pull over shirt/dress     Pull over shirt/dress - Perfomed by patient: Thread/unthread right sleeve, Thread/unthread left sleeve, Put head through opening, Pull shirt over trunk          Upper body assist Assist Level: Touching or steadying assistance(Pt > 75%)      Lower Body Dressing/Undressing Lower body dressing  Lower body assist        Toileting Toileting Toileting activity did not occur: Safety/medical concerns        Toileting assist     Transfers Chair/bed transfer   Chair/bed transfer method: Stand pivot, Squat pivot Chair/bed transfer assist level: Moderate assist (Pt 50 - 74%/lift or lower) Chair/bed transfer assistive device: Armrests     Locomotion Ambulation     Max distance: 250 ft Assist level: Touching or steadying assistance (Pt > 75%)   Wheelchair   Type: Manual Max wheelchair distance: 150 ft (straight hallway only) Assist  Level: Supervision or verbal cues  Cognition Comprehension Comprehension assist level: Follows complex conversation/direction with no assist  Expression Expression assist level: Expresses complex 90% of the time/cues < 10% of the time  Social Interaction Social Interaction assist level: Interacts appropriately with others - No medications needed.  Problem Solving Problem solving assist level: Solves complex problems: With extra time  Memory Memory assist level: More than reasonable amount of time    Medical Problem List and Plan: 1. Weakness, abnormality of gait, pain secondary to Debility.   Continue CIR 2. DVT Prophylaxis/Anticoagulation: Pharmaceutical: Lovenox 3. Pain Management:   Continue oxycodone prn.    Neurontin increased to 300 TID on 4/27  Educated on spreading out narcotics.  4. Mood: Team to provide ego support.   Cont low dose xanax for prn use.  5. Neuropsych: This patient is capable of making decisions on his own behalf. 6. Skin/Wound Care: Educated on pressure relief measures.   Cont nystatin cream to peri area and dose of diflucan 7. Fluids/Electrolytes/Nutrition: Monitor I/O.   Check lytes in am.   Wife advised to bring food from home.  8. Anemia of critical illness:   7.6 on 4/25    Will start iron supplement  Cont to monitor 9. Hypokalemia: continue Magnesium supplement. Low dose supplement added.   K+ 3.8 on 4/26 10. Peripheral edema: Diurese prn. Patient and wife educated on monitoring salt intake. Protein supplement added due to low protein stores.  11. Acute renal failure due to ATN: avoid nephrotoxic medications. Monitor with serial checks--MWF until renal status normalizes.    Cr. 1.96 on 4/26 12. Yeast infection: Treated  LOS (Days) 3 A FACE TO FACE EVALUATION WAS PERFORMED  Zoriyah Scheidegger Karis Jubanil Ailyn Gladd 09/27/2015 9:03 AM

## 2015-09-27 NOTE — Progress Notes (Signed)
Physical Therapy Session Note  Patient Details  Name: John Hoover MRN: [id number] Date of Birth: 05-21-1966  Today's Date: 09/27/2015 PT Individual Time: 0800-0900 AND 1500-1545 PT Individual Time Calculation (min): 60 min AND 45 minutes.   Short Term Goals: Week 1:  PT Short Term Goal 1 (Week 1): Pt will be able to complete stand pivot/squat pivot transfers with Min A. PT Short Term Goal 2 (Week 1): Pt will be able to transfer sit<>stand with CGA. PT Short Term Goal 3 (Week 1): Pt will ambulate 50 ft with LRAD & min A.  Skilled Therapeutic Interventions/Progress Updates:   Session 1  Patient received supine in bed and agreeable to PT. Per MD pt can tolerate ace wrap of BLE. PT applied ace wrap to knee with 25% stretch to prevent build up of edema in upright position. Patient performed bed mobility with min A for supine to sit. Gait training in hall for 113ft x2 with RW and min A From PT as well as cues for improved erect posture and increased step width to improve stability. Nustep training for endurance and improved reciprocal movement 2 min level 2, 8 min level 5, 2 min level 2, Cues to keep step per minute < 65 to prevent increased fatigue.   Sit to stand 5 x 3 with BUE support on thighs from elevated surface height, 27 inches. x 5 at 25 inches.   Supine therex  Ankle DF AAROM Ankle PF level 2 tband  Clam shell level 2 tband  Marched level 2 tband .  Throughout therex PT provided moderate verbal and tactile instruction for improved ROM and increased eccentric control to improve strengthening aspect of movement and increase proper timing and activation or LE musculature.  Patient returned to room and left sitting in [facility] with call bell within reach.   Session 2.  Patient received sitting in recliner and agreeable to PT. Gait training in hall 214ft, and 150 with RW and min A and verbal instruction for increased step width and foot clearance, moderate adjustments in gait noted  followed instruction from PT.   Seated therex:  LAQ 2x 10  Ankle Df x 12 Marches x 10  Hip abdiction with level 2 tband x 2  PT provided moderate verbal, tactile  And visual instruction for improved ROM, decreased compensation from trunk, and increased eccentric control to improve strengthening aspect of movement and promote proper timing and activation or LE musculature.   Standing balance.  Standing without UE support, narrow BOS x 45 seconds, normal BOS 2x1 minute Lateral weight shift x 15 R and L without UE support Lateral/anterior reaching/ weight shift to basketball goal R and L x 6 each side, no UE support. Patient able to maintain standing without UE support for ~6 minutes at a time. Min A from PT with weight shifting balance tasks to prevent lateral and posterior LOB as well as improve activation of core and hip extensors.   Patient returned to room and left in recliner with call bell within reach.         Therapy Documentation Precautions:  Precautions Precautions: Fall Restrictions Weight Bearing Restrictions: No Pain: Pain Assessment Pain Assessment: No/denies pain Pain Score: 0-No pain Pain Type: Acute pain Pain Location: Toe (Comment which one) Pain Orientation: Right;Left Pain Descriptors / Indicators: Aching;Tingling;Numbness Pain Frequency: Constant Pain Onset: On-going Patients Stated Pain Goal: 4 Pain Intervention(s): Medication (See eMAR);Repositioned   See Function Navigator for Current Functional Status.   Therapy/Group: Individual Therapy  Golden Popustin E Jaylene Schrom 09/27/2015, 9:58 AM

## 2015-09-28 ENCOUNTER — Inpatient Hospital Stay (HOSPITAL_COMMUNITY): Payer: BLUE CROSS/BLUE SHIELD | Admitting: Physical Therapy

## 2015-09-28 ENCOUNTER — Inpatient Hospital Stay (HOSPITAL_COMMUNITY): Payer: BLUE CROSS/BLUE SHIELD | Admitting: Occupational Therapy

## 2015-09-28 ENCOUNTER — Inpatient Hospital Stay (HOSPITAL_COMMUNITY): Payer: BLUE CROSS/BLUE SHIELD

## 2015-09-28 LAB — CBC WITH DIFFERENTIAL/PLATELET
Basophils Absolute: 0 10*3/uL (ref 0.0–0.1)
Basophils Relative: 1 %
EOS PCT: 3 %
Eosinophils Absolute: 0.2 10*3/uL (ref 0.0–0.7)
HEMATOCRIT: 25.6 % — AB (ref 39.0–52.0)
Hemoglobin: 7.8 g/dL — ABNORMAL LOW (ref 13.0–17.0)
LYMPHS PCT: 39 %
Lymphs Abs: 2.4 10*3/uL (ref 0.7–4.0)
MCH: 29.3 pg (ref 26.0–34.0)
MCHC: 30.5 g/dL (ref 30.0–36.0)
MCV: 96.2 fL (ref 78.0–100.0)
MONO ABS: 0.8 10*3/uL (ref 0.1–1.0)
Monocytes Relative: 13 %
NEUTROS ABS: 2.7 10*3/uL (ref 1.7–7.7)
Neutrophils Relative %: 44 %
PLATELETS: 270 10*3/uL (ref 150–400)
RBC: 2.66 MIL/uL — ABNORMAL LOW (ref 4.22–5.81)
RDW: 16.8 % — AB (ref 11.5–15.5)
WBC: 6.1 10*3/uL (ref 4.0–10.5)

## 2015-09-28 LAB — BASIC METABOLIC PANEL
Anion gap: 11 (ref 5–15)
BUN: 26 mg/dL — AB (ref 6–20)
CHLORIDE: 112 mmol/L — AB (ref 101–111)
CO2: 22 mmol/L (ref 22–32)
Calcium: 8.7 mg/dL — ABNORMAL LOW (ref 8.9–10.3)
Creatinine, Ser: 1.87 mg/dL — ABNORMAL HIGH (ref 0.61–1.24)
GFR calc Af Amer: 46 mL/min — ABNORMAL LOW (ref >60)
GFR, EST NON AFRICAN AMERICAN: 40 mL/min — AB (ref >60)
Glucose, Bld: 96 mg/dL (ref 65–99)
POTASSIUM: 3.6 mmol/L (ref 3.5–5.1)
SODIUM: 145 mmol/L (ref 135–145)

## 2015-09-28 MED ORDER — POTASSIUM CHLORIDE CRYS ER 20 MEQ PO TBCR
20.0000 meq | EXTENDED_RELEASE_TABLET | Freq: Every day | ORAL | Status: DC
  Administered 2015-09-29 – 2015-10-01 (×3): 20 meq via ORAL
  Filled 2015-09-28 (×4): qty 1

## 2015-09-28 NOTE — Progress Notes (Signed)
Occupational Therapy Session Note  Patient Details  Name: John Hoover Dotts MRN: [id number] Date of Birth: 12/14/1965  Today's Date: 09/28/2015 OT Individual Time: 1445- 1545  OT Individual Time Calculation (min): 60 min    Short Term Goals: Week 1:  OT Short Term Goal 1 (Week 1): Transfer to toilet with supervision OT Short Term Goal 2 (Week 1): Demonstrate ability to use AE to compensate for impaired FMC at bilateral hands OT Short Term Goal 3 (Week 1): Complete 15 minutes of continuous therex to strengthen bilateral upper/lower body strength and endurance OT Short Term Goal 4 (Week 1): Prepare simple meal using LRAD with min assist OT Short Term Goal 5 (Week 1): Bathe and dress with min assist to perform foot care Week 2:     Skilled Therapeutic Interventions/Progress Updates:    General conditioning, AE training (for hand weakness), grip/pinch and FMC, balance, transfers.  Engaged in functional mobility from room to gym with RW and min assist.  Pt has special shoes which makes him a high fall risk.   Engaged in therapeutic activities for Both hands.  Pt has dome tenderness on dorsum fingers due to necrotic tissue and scap removal.  Pt tolerated all hand exercises for intrinsics.  Provided exercises for finger raises /extension for pt to practice in room.  Has theraputty in room to work with as well.  Pt ambulated to room and left with wife, Drinda Buttsnnette in room.      Therapy Documentation Precautions:  Precautions Precautions: Fall Restrictions Weight Bearing Restrictions: No      Pain: Pain Assessment Pain Assessment: 0-10 Pain Score: 4  ADL: ADL ADL Comments: see Functional Assessment Tool Exercises: Hand Exercises Digit Composite Flexion: Hand exerciser;Sidelying;Both;Strengthening Composite Extension: Strengthening;Both;Seated;Hand exerciser Digit Composite Abduction: Strengthening;Both;Hand exerciser  See Function Navigator for Current Functional  Status.   Therapy/Group: Individual Therapy  Humberto Sealsdwards, Jahmar Mckelvy J 09/28/2015, 12:46 PM

## 2015-09-28 NOTE — Progress Notes (Signed)
[place] PHYSICAL MEDICINE & REHABILITATION     PROGRESS NOTE  Subjective/Complaints:  Patient sitting up in bed this morning. He continues to be very positive and believes he is getting better. Wounds on his hand have improved. He continues of neuropathic pain but does not want to increase his medications.  ROS: + Neuropathic pain. denies CP, SOB, nausea, vomiting, diarrhea  Objective: Vital Signs: Blood pressure 123/88, pulse 101, temperature 98.7 F (37.1 C), temperature source Oral, resp. rate 16, height 6\' 1"  (1.854 m), weight 85.7 kg (188 lb 15 oz), SpO2 97 %. No results found.  Recent Labs  09/28/15 0730  WBC 6.1  HGB 7.8*  HCT 25.6*  PLT 270    Recent Labs  09/26/15 0537 09/28/15 0730  NA 144 145  K 3.8 3.6  CL 112* 112*  GLUCOSE 96 96  BUN 28* 26*  CREATININE 1.96* 1.87*  CALCIUM 8.5* 8.7*   CBG (last 3)  No results for input(s): GLUCAP in the last 72 hours.  Wt Readings from Last 3 Encounters:  09/28/15 85.7 kg (188 lb 15 oz)  09/12/15 100.5 kg (221 lb 9 oz)  08/27/15 91.899 kg (202 lb 9.6 oz)    Physical Exam:  BP 123/88 mmHg  Pulse 101  Temp(Src) 98.7 F (37.1 C) (Oral)  Resp 16  Ht 6\' 1"  (1.854 m)  Wt 85.7 kg (188 lb 15 oz)  BMI 24.93 kg/m2  SpO2 97% Constitutional: He appears well-developed and well-nourished. No distress.  HENT: Normocephalic and atraumatic.  Eyes: Conjunctivae and EOM are normal.   Cardiovascular: + Tachycardia. Regular rhythm.  Respiratory: Effort normal. No respiratory distress. Clear to ausculation.  GI: Soft. Bowel sounds are normal. He exhibits no distension. There is no tenderness.  Musculoskeletal: He exhibits edema and tenderness.  1 + edema with bilateral lower shins and ankles(Improving).  Neurological: He is alert and oriented to person, place, and time.  Motor: 4+/5 throughout, except bilateral ankle dorsi/plantar flexion 4-/5  Skin: Skin is warm and dry. No rash noted.  Ischemic changes to bilateral  toes and fingertips (fingertips improving)  Psychiatric: His behavior is normal. Thought content normal. His mood appears anxious. Cognition and memory are normal.  Assessment/Plan: 1. Functional deficits secondary to debility which require 3+ hours per day of interdisciplinary therapy in a comprehensive inpatient rehab setting. Physiatrist is providing close team supervision and 24 hour management of active medical problems listed below. Physiatrist and rehab team continue to assess barriers to discharge/monitor patient progress toward functional and medical goals.  Function:  Bathing Bathing position   Position: Shower  Bathing parts Body parts bathed by patient: Left arm, Right arm, Chest, Abdomen, Front perineal area, Right upper leg, Left upper leg, Right lower leg, Left lower leg Body parts bathed by helper: Buttocks, Back  Bathing assist Assist Level: Touching or steadying assistance(Pt > 75%)      Upper Body Dressing/Undressing Upper body dressing   What is the patient wearing?: Pull over shirt/dress     Pull over shirt/dress - Perfomed by patient: Thread/unthread right sleeve, Thread/unthread left sleeve, Put head through opening, Pull shirt over trunk          Upper body assist Assist Level: More than reasonable time      Lower Body Dressing/Undressing Lower body dressing   What is the patient wearing?: Underwear, Pants, Non-skid slipper socks Underwear - Performed by patient: Thread/unthread left underwear leg, Thread/unthread right underwear leg, Pull underwear up/down Underwear - Performed by helper: Pull  underwear up/down Pants- Performed by patient: Thread/unthread right pants leg, Thread/unthread left pants leg, Fasten/unfasten pants Pants- Performed by helper: Pull pants up/down   Non-skid slipper socks- Performed by helper: Don/doff right sock, Don/doff left sock                  Lower body assist Assist for lower body dressing: Touching or steadying  assistance (Pt > 75%)      Toileting Toileting Toileting activity did not occur: Safety/medical concerns Toileting steps completed by patient: Adjust clothing prior to toileting, Performs perineal hygiene, Adjust clothing after toileting (per [place], NT report)      Toileting assist Assist level: Touching or steadying assistance (Pt.75%)   Transfers Chair/bed transfer   Chair/bed transfer method: Stand pivot Chair/bed transfer assist level: Touching or steadying assistance (Pt > 75%) Chair/bed transfer assistive device: Walker, Designer, fashion/clothing     Max distance: 200 Assist level: Touching or steadying assistance (Pt > 75%)   Wheelchair   Type: Manual Max wheelchair distance: 150 ft (straight hallway only) Assist Level: Supervision or verbal cues  Cognition Comprehension Comprehension assist level: Follows complex conversation/direction with no assist  Expression Expression assist level: Expresses complex 90% of the time/cues < 10% of the time  Social Interaction Social Interaction assist level: Interacts appropriately with others - No medications needed.  Problem Solving Problem solving assist level: Solves complex problems: With extra time  Memory Memory assist level: More than reasonable amount of time    Medical Problem List and Plan: 1. Weakness, abnormality of gait, pain secondary to Debility.   Continue CIR 2. DVT Prophylaxis/Anticoagulation: Pharmaceutical: Lovenox 3. Pain Management:   Continue oxycodone prn.    Neurontin increased to 300 TID on 4/27  Educated on spreading out narcotics.  4. Mood: Team to provide ego support.   Cont low dose xanax for prn use.  5. Neuropsych: This patient is capable of making decisions on his own behalf. 6. Skin/Wound Care: Educated on pressure relief measures.   Cont nystatin cream to peri area and dose of diflucan 7. Fluids/Electrolytes/Nutrition: Monitor I/O.   Wife advised to bring food from home.   8. Anemia of critical illness:   Hemoglobin 7.8 on 4/28    Continue iron supplement  Cont to monitor 9. Hypokalemia: continue Magnesium supplement.   Low dose supplement added, increased to 20 on 4/28.   K+ 3.6 on 4/28 10. Peripheral edema: Diurese prn. Patient and wife educated on monitoring salt intake. Protein supplement added due to low protein stores.  11. Acute renal failure due to ATN: avoid nephrotoxic medications. Continue to monitor.   Cr. 1.87 on 4/28 (slowly improving) 12. Yeast infection: Treated  LOS (Days) 4 A FACE TO FACE EVALUATION WAS PERFORMED  Burnard Enis Karis Juba 09/28/2015 10:22 AM

## 2015-09-28 NOTE — Progress Notes (Signed)
Physical Therapy Session Note  Patient Details  Name: John Hoover MRN: [id number] Date of Birth: Apr 28, 1966  Today's Date: 09/28/2015 PT Individual Time: 800-900 Individual treatment time 60 minutes.   Short Term Goals: Week 1:  PT Short Term Goal 1 (Week 1): Pt will be able to complete stand pivot/squat pivot transfers with Min A. PT Short Term Goal 2 (Week 1): Pt will be able to transfer sit<>stand with CGA. PT Short Term Goal 3 (Week 1): Pt will ambulate 50 ft with LRAD & min A.  Skilled Therapeutic Interventions/Progress Updates:   Patient received supine in bed and agreeable to PT. PT applied Ace wrap to BLE to the knees. Supine>sit with supervision A and cues for UE placement. Gait training with RW for 14925ft x2 with min A from PT . Gait training in parallel bars with 1 UE support, 5010ft x 4. Gait in parallel Bars without UE support 2010ft x 4. Side stepping without UE support 7110ftx 3 each direction. For all gait training, PT provided min A as well as mod A x 1 to prevent posterior LOB. PT also provided cues for improve terminal knee extension, improved heel stike, increased foot clearance, and to maintain normal BOS to improve stability.   Standing balance:  Lateral reach to basketball goal to place horse shoe x 6 BUE without UE support. Patient able to maintain standing for up to 7 minutes with rest break.  Standing on wedge To build pipe tree x 2.  Min-supervision A from PT for standing balance training as well as moderate cues for improved forward weight shift and increased erect posture.   Gait without AD for 5415ft without RW and mod A from PT, with cues to maintain wide BOS and increase step length as tolerated.   Patient returned to room and left sitting in recliner with call bell within reach.          Therapy Documentation Precautions:  Precautions Precautions: Fall Restrictions Weight Bearing Restrictions: No General:   Vital Signs: Therapy Vitals Temp: 99 F  (37.2 C) Temp Source: Oral Pulse Rate: (!) 101 Resp: 17 BP: 128/86 mmHg Patient Position (if appropriate): Sitting Oxygen Therapy SpO2: 98 % O2 Device: Not Delivered Pain: Pain Assessment Pain Assessment: 0-10 Pain Score: 7  Pain Type: Neuropathic pain Pain Location: Foot Pain Orientation: Right;Left Pain Descriptors / Indicators: Burning;Shooting Pain Frequency: Intermittent Pain Onset: On-going Patients Stated Pain Goal: 4 Pain Intervention(s): Medication (See eMAR)  See Function Navigator for Current Functional Status.   Therapy/Group: Individual Therapy  John Hoover 09/28/2015, 5:24 PM

## 2015-09-28 NOTE — Progress Notes (Signed)
Physical Therapy Session Note  Patient Details  Name: John Hoover MRN: [id number] Date of Birth: 22-Sep-1965  Today's Date: 09/28/2015 PT Individual Time: 1030-1100 PT Individual Time Calculation (min): 30 min   Short Term Goals: Week 1:  PT Short Term Goal 1 (Week 1): Pt will be able to complete stand pivot/squat pivot transfers with Min A. PT Short Term Goal 2 (Week 1): Pt will be able to transfer sit<>stand with CGA. PT Short Term Goal 3 (Week 1): Pt will ambulate 50 ft with LRAD & min A.  Skilled Therapeutic Interventions/Progress Updates:    Pt received seated in recliner; denies pain and agreeable to treatment. Asking when he can get IV removed; deferred to RN. Gait to/from gym with RW and min guard; min cues for upright posture and noted poor foot clearance. Nustep x10 min with BUE/BLE level 8 with average 45 steps/min for strengthening and aerobic endurance. Sit <>stand from nustep seat 2x5 reps; required variable min-max for transfer due to improper body mechanics with reduced anterior weight shift d/t fear of falling, combined with LE strength deficits. Returned to room with gait as above and seated in recliner. Educated pt on gastroc/soleus/hamstring stretch with towel to counter muscle length changes associated with significantly reduced dorsiflexor strength. Pt performed 1 trial on each LE; notes difficult to perform due to UE strength deficits however discussed gradually increasing stretch hold time per UE tolerance. Pt remained seated in recliner with all needs in reach at completion of session.   Therapy Documentation Precautions:  Precautions Precautions: Fall Restrictions Weight Bearing Restrictions: No Pain: Pain Assessment Pain Assessment: 0-10 Pain Score: 6  Pain Type: Neuropathic pain Pain Location: Foot Pain Orientation: Right;Left Pain Descriptors / Indicators: Tingling Pain Onset: On-going Patients Stated Pain Goal: 4 Pain Intervention(s):  Ambulation/increased activity   See Function Navigator for Current Functional Status.   Therapy/Group: Individual Therapy  Vista Lawmanlizabeth J Tygielski 09/28/2015, 11:45 AM

## 2015-09-28 NOTE — Progress Notes (Signed)
Occupational Therapy Session Note  Patient Details  Name: Peterson AoWilliam E Obenchain MRN: [id number] Date of Birth: 08-25-1965  Today's Date: 09/28/2015 OT Individual Time: 1100-1200 OT Individual Time Calculation (min): 60 min    Short Term Goals: Week 1:  OT Short Term Goal 1 (Week 1): Transfer to toilet with supervision OT Short Term Goal 2 (Week 1): Demonstrate ability to use AE to compensate for impaired FMC at bilateral hands OT Short Term Goal 3 (Week 1): Complete 15 minutes of continuous therex to strengthen bilateral upper/lower body strength and endurance OT Short Term Goal 4 (Week 1): Prepare simple meal using LRAD with min assist OT Short Term Goal 5 (Week 1): Bathe and dress with min assist to perform foot care  Skilled Therapeutic Interventions/Progress Updates: ADL-retraining (45 min) with focus on functional mobility, dynamic standing balance, safety awareness and skin care.   Pt ambulated from w/c to shower and bathes with setup and min vc to problem-solve and sequence.   Pt directed to remove his ace wrap by himself and manage clothing using reacher, as needed.   Pt demo's mild attention deficit with need for reminders to lock w/c brakes when rising or lowering to w/c and to transfer closer to target location before lowering to sit.    Pt required only setup to re-wrap legs and don non-skid socks d/t tenderness at both feet, distally.    Therex (15 min) with focus on hand grip and pinch strengthening.   Grip/pinch assessed as follows per Jamar hand dyno and pinch gage.  Grip: R=35 lbs, L= 18 lbs Key Pinch: R= 13, L=14 lbs Tip Pinch: R=3, L=unable to flex thumb IP joint during testing Tripod Pinch: R=8, L= 12 lbs  Pt performs 4 grip/pinch exercises, written HEP provided  Therapy Documentation Precautions:  Precautions Precautions: Fall Restrictions Weight Bearing Restrictions: No  Pain: Pain Assessment Pain Assessment: 0-10 Pain Score: 4    ADL: ADL ADL Comments: see  Functional Assessment Tool   Exercises: Hand Exercises Digit Composite Flexion: Hand exerciser;Sidelying;Both;Strengthening Composite Extension: Strengthening;Both;Seated;Hand exerciser Digit Composite Abduction: Strengthening;Both;Hand exerciser Other Treatments:    See Function Navigator for Current Functional Status.   Therapy/Group: Individual Therapy  Javanni Maring 09/28/2015, 12:42 PM

## 2015-09-29 ENCOUNTER — Inpatient Hospital Stay (HOSPITAL_COMMUNITY): Payer: BLUE CROSS/BLUE SHIELD | Admitting: Physical Therapy

## 2015-09-29 NOTE — Progress Notes (Signed)
[place] PHYSICAL MEDICINE & REHABILITATION     PROGRESS NOTE  Subjective/Complaints:  No issues overnight, gets good relief from gabapentin for his nerve pain.  ROS: + Neuropathic pain. denies CP, SOB, nausea, vomiting, diarrhea  Objective: Vital Signs: Blood pressure 133/89, pulse 102, temperature 98.6 F (37 C), temperature source Oral, resp. rate 17, height  (1.854 m), weight 89.8 kg (197 lb 15.6 oz), SpO2 95 %. No results found.  Recent Labs  09/28/15 0730  WBC 6.1  HGB 7.8*  HCT 25.6*  PLT 270    Recent Labs  09/28/15 0730  NA 145  K 3.6  CL 112*  GLUCOSE 96  BUN 26*  CREATININE 1.87*  CALCIUM 8.7*   CBG (last 3)  No results for input(s): GLUCAP in the last 72 hours.  Wt Readings from Last 3 Encounters:  09/29/15 89.8 kg (197 lb 15.6 oz)  09/12/15 100.5 kg (221 lb 9 oz)  08/27/15 91.899 kg (202 lb 9.6 oz)    Physical Exam:  BP 133/89 mmHg  Pulse 102  Temp(Src) 98.6 F (37 C) (Oral)  Resp 17  Ht  (1.854 m)  Wt 89.8 kg (197 lb 15.6 oz)  BMI 26.13 kg/m2  SpO2 95% Constitutional: He appears well-developed and well-nourished. No distress.  HENT: Normocephalic and atraumatic.  Eyes: Conjunctivae and EOM are normal.   Cardiovascular: + Tachycardia. Regular rhythm.  Respiratory: Effort normal. No respiratory distress. Clear to ausculation.  GI: Soft. Bowel sounds are normal. He exhibits no distension. There is no tenderness.  Musculoskeletal: He exhibits edema and tenderness.  1 + edema with bilateral lower shins and ankles(Improving).  Neurological: He is alert and oriented to person, place, and time.  Motor: 4+/5 throughout, except bilateral ankle dorsi/plantar flexion 4-/5  Skin: Skin is warm and dry. No rash noted.  Ischemic changes to bilateral toes and fingertips (fingertips improving)  Psychiatric: His behavior is normal. Thought content normal. His mood appears anxious. Cognition and memory are normal.  Assessment/Plan: 1.  Functional deficits secondary to debility which require 3+ hours per day of interdisciplinary therapy in a comprehensive inpatient rehab setting. Physiatrist is providing close team supervision and 24 hour management of active medical problems listed below. Physiatrist and rehab team continue to assess barriers to discharge/monitor patient progress toward functional and medical goals.  Function:  Bathing Bathing position   Position: Shower  Bathing parts Body parts bathed by patient: Left arm, Right arm, Chest, Abdomen, Front perineal area, Right upper leg, Left upper leg, Right lower leg, Left lower leg Body parts bathed by helper: Buttocks, Back  Bathing assist Assist Level: Touching or steadying assistance(Pt > 75%)      Upper Body Dressing/Undressing Upper body dressing   What is the patient wearing?: Pull over shirt/dress     Pull over shirt/dress - Perfomed by patient: Thread/unthread right sleeve, Thread/unthread left sleeve, Put head through opening, Pull shirt over trunk          Upper body assist Assist Level: More than reasonable time      Lower Body Dressing/Undressing Lower body dressing   What is the patient wearing?: Underwear, Pants, Non-skid slipper socks Underwear - Performed by patient: Thread/unthread left underwear leg, Thread/unthread right underwear leg, Pull underwear up/down Underwear - Performed by helper: Pull underwear up/down Pants- Performed by patient: Thread/unthread right pants leg, Thread/unthread left pants leg, Fasten/unfasten pants Pants- Performed by helper: Pull pants up/down   Non-skid slipper socks- Performed by helper: Don/doff right sock, Don/doff  left sock                  Lower body assist Assist for lower body dressing: Touching or steadying assistance (Pt > 75%)      Toileting Toileting Toileting activity did not occur: Safety/medical concerns Toileting steps completed by patient: Adjust clothing prior to toileting,  Performs perineal hygiene, Adjust clothing after toileting (per Caryn BeeKevin, NT report)      Toileting assist Assist level: Touching or steadying assistance (Pt.75%)   Transfers Chair/bed transfer   Chair/bed transfer method: Stand pivot Chair/bed transfer assist level: Touching or steadying assistance (Pt > 75%) Chair/bed transfer assistive device: Walker, Designer, fashion/clothingArmrests     Locomotion Ambulation     Max distance: 125 Assist level: Touching or steadying assistance (Pt > 75%)   Wheelchair   Type: Manual Max wheelchair distance: 150 ft (straight hallway only) Assist Level: Supervision or verbal cues  Cognition Comprehension Comprehension assist level: Follows complex conversation/direction with no assist  Expression Expression assist level: Expresses complex 90% of the time/cues < 10% of the time  Social Interaction Social Interaction assist level: Interacts appropriately with others - No medications needed.  Problem Solving Problem solving assist level: Solves complex problems: With extra time  Memory Memory assist level: More than reasonable amount of time    Medical Problem List and Plan: 1. Weakness, abnormality of gait, pain secondary to Debility.   Continue CIR 2. DVT Prophylaxis/Anticoagulation: Pharmaceutical: Lovenox 3. Pain Management:   Continue oxycodone prn.    Neurontin increased to 300 TID on 4/27, May need to go up to 4 times a day  Educated on spreading out narcotics.  4. Mood: Team to provide ego support.   Cont low dose xanax for prn use.  5. Neuropsych: This patient is capable of making decisions on his own behalf. 6. Skin/Wound Care: Educated on pressure relief measures.   Cont nystatin cream to peri area and dose of diflucan 7. Fluids/Electrolytes/Nutrition: Monitor I/O.   Wife advised to bring food from home.  8. Anemia of critical illness:   Hemoglobin 7.8 on 4/28     Continue iron supplement  Cont to monitor 9. Hypokalemia: continue Magnesium  supplement.   Low dose supplement added, increased to 20 on 4/28.   K+ 3.6 on 4/28 10. Peripheral edema: Diurese prn. Patient and wife educated on monitoring salt intake. Protein supplement added due to low protein stores.  11. Acute renal failure due to ATN: avoid nephrotoxic medications. Continue to monitor.   Cr. 1.87 on 4/28 (slowly improving) 12. Yeast infection: Treated  LOS (Days) 5 A FACE TO FACE EVALUATION WAS PERFORMED  Claudette LawsKIRSTEINS,ANDREW E 09/29/2015 11:06 AM

## 2015-09-29 NOTE — Progress Notes (Signed)
Physical Therapy Session Note  Patient Details  Name: John Hoover MRN: [id number] Date of Birth: 06-Jul-1965  Today's Date: 09/29/2015 PT Individual Time: 0920-1100 PT Individual Time Calculation (min): 100 min   Short Term Goals: Week 1:  PT Short Term Goal 1 (Week 1): Pt will be able to complete stand pivot/squat pivot transfers with Min A. PT Short Term Goal 2 (Week 1): Pt will be able to transfer sit<>stand with CGA. PT Short Term Goal 3 (Week 1): Pt will ambulate 50 ft with LRAD & min A.  Skilled Therapeutic Interventions/Progress Updates:    Patient received sitting in recliner with trade off from [employer]. PT applied Ace wraps to BLE to prevent excess edema. Gait training in hall for 125 ft with RW min A Gait training without AD for 149f x2 And min-Mod A on first trial and Mod A on second. Gait training on uneven sidewalk for 513fx 2 with mod A from PT. Gait in rehab gym for 1579f 4 without AD and mod A from PT for transfer to and from chair to mat table. Patient required moderte verbal instruction for improved foot clearance as well as improved erect posture, increased heel contact, increased BOS, and improved terminal knee extension. Mild improvement noted following each cue, that maintained for approx 4-5 steps.   Sit<>stand x 6 and x 5 from 25 inches with push from thighs with min A from PT and constant cues for improved posture and increased forward weight shift.   Step up/down on 4 inch step x 10 BLE and 6 inch step x 8 BLE with BUE support with verbal instruction to prevent narrow BOS, improve foot clearance and decreased UE support as tolerated.   WC mobility in controlloed environment for [id number] with supervision A from PT with moderate cues for improved UE positioning and increased use of push/pull for sharper turning radius to avoid obstacles in hall. WC mobility training on uneven sidewalk for [id number] with min A form PT to maintain straight tragectory with sloped sidewalk  and to traverse up handicap access ramp. Cues for improved use of LUE as tolerated to prevent veering L on level and unlevel surfaces.   Throughout treatment patient performed sit<>stand x 20 with Min A from PT with moderate cues for UE and LE positioning for increased safety of transfers. WC transfer x 8 with mod cues for increased safety and break management throughout treatment.   PT adjusted WC for hiegher seat to floor height for improved safety with transfers and improved fit of WC.   Patient left sitting in recliner with call bell within reach and all other needs met.         Therapy Documentation Precautions:  Precautions Precautions: Fall Restrictions Weight Bearing Restrictions: No General:   Vital Signs:  Pain: Pain Assessment Pain Assessment: 0-10 Pain Score: 7  Pain Type: Neuropathic pain Pain Location: Foot Pain Orientation: Right;Left Pain Descriptors / Indicators: Tingling;Pins and needles Patients Stated Pain Goal: 4 Pain Intervention(s): Medication (See eMAR) Multiple Pain Sites: No   See Function Navigator for Current Functional Status.   Therapy/Group: Individual Therapy  AustiLorie Phenix/2017, 12:36 PM

## 2015-09-30 ENCOUNTER — Inpatient Hospital Stay (HOSPITAL_COMMUNITY): Payer: BLUE CROSS/BLUE SHIELD | Admitting: Occupational Therapy

## 2015-09-30 ENCOUNTER — Inpatient Hospital Stay (HOSPITAL_COMMUNITY): Payer: BLUE CROSS/BLUE SHIELD | Admitting: *Deleted

## 2015-09-30 DIAGNOSIS — G6281 Critical illness polyneuropathy: Secondary | ICD-10-CM | POA: Diagnosis present

## 2015-09-30 NOTE — Progress Notes (Signed)
Occupational Therapy Session Note  Patient Details  Name: John Hoover MRN: [id number] Date of Birth: [birth year]  Today's Date: 09/30/2015 OT Individual Time:  -   1100-1210  (70 min)                                          1430- 1530  (60 min)      Short Term Goals: Week 1:  OT Short Term Goal 1 (Week 1): Transfer to toilet with supervision OT Short Term Goal 2 (Week 1): Demonstrate ability to use AE to compensate for impaired FMC at bilateral hands OT Short Term Goal 3 (Week 1): Complete 15 minutes of continuous therex to strengthen bilateral upper/lower body strength and endurance OT Short Term Goal 4 (Week 1): Prepare simple meal using LRAD with min assist OT Short Term Goal 5 (Week 1): Bathe and dress with min assist to perform foot care Week 2:     Skilled Therapeutic Interventions/Progress Updates:     1st session:   OT addressed basic ADLtraining at sink level in wc.  Addressed functional transfers, bed mobility, standing balance, functional mobility.  Pt ambulated to shower area.  Transferred to shower bench with  Min guard.  Pt performed 75 % of bathing in sitting and remainder in standing.  Pt was SBA with standing balance with holding to grab bar.  Pt completed bathing with set up assist for items.  Pt stood at sink for 5 minutes to brush teeth and comb hair.  Educated pt on leg wrapping.  PPt has hand out from previous session for wrapping legs.   Pt left in recliner without shoes and footies.       2nd session:  Engaged in functional mobility during simple cooking activity.  Ambulated from room to ADL apt>gym>room RW.  Marland Kitchen  Pt engaged in making jello at RW level.   Pt performed ambulation with just footies and engaged in tai chi walking.  Pt demonstrated decreased ankle dorsiflexion and foot supination.  Pt. Performed jello activity with SBA for balance and education about handling food with the RW.Went over passing food from counter to counter.  Pt had no LOB with activity.  Pt  ambulated to gym.  Performed 9 hole peg with left for 51. 27 sec; right = 35 83sec.  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   Therapy Documentation Precautions:  Precautions Precautions: Fall Restrictions Weight Bearing Restrictions: No       Pain: Pain Assessment Pain Assessment: 0-10 Pain Score: 4  ADL: ADL ADL Comments: see Functional Assessment Tool     See Function Navigator for Current Functional Status.   Therapy/Group: Individual Therapy  Lisa Roca 09/30/2015, 12:22 PM

## 2015-09-30 NOTE — Progress Notes (Signed)
[place] PHYSICAL MEDICINE & REHABILITATION     PROGRESS NOTE  Subjective/Complaints:  No issues overnight, gets good relief from gabapentin for his nerve pain.  ROS: + Neuropathic pain. denies CP, SOB, nausea, vomiting, diarrhea  Objective: Vital Signs: Blood pressure 131/87, pulse 100, temperature 98.6 F (37 C), temperature source Oral, resp. rate 18, height 6\' 1"  (1.854 m), weight 88.6 kg (195 lb 5.2 oz), SpO2 96 %. No results found.  Recent Labs  09/28/15 0730  WBC 6.1  HGB 7.8*  HCT 25.6*  PLT 270    Recent Labs  09/28/15 0730  NA 145  K 3.6  CL 112*  GLUCOSE 96  BUN 26*  CREATININE 1.87*  CALCIUM 8.7*   CBG (last 3)  No results for input(s): GLUCAP in the last 72 hours.  Wt Readings from Last 3 Encounters:  09/30/15 88.6 kg (195 lb 5.2 oz)  09/12/15 100.5 kg (221 lb 9 oz)  08/27/15 91.899 kg (202 lb 9.6 oz)    Physical Exam:  BP 131/87 mmHg  Pulse 100  Temp(Src) 98.6 F (37 C) (Oral)  Resp 18  Ht 6\' 1"  (1.854 m)  Wt 88.6 kg (195 lb 5.2 oz)  BMI 25.78 kg/m2  SpO2 96% Constitutional: He appears well-developed and well-nourished. No distress.  HENT: Normocephalic and atraumatic.  Eyes: Conjunctivae and EOM are normal.   Cardiovascular: + Tachycardia. Regular rhythm.  Respiratory: Effort normal. No respiratory distress. Clear to ausculation.  GI: Soft. Bowel sounds are normal. He exhibits no distension. There is no tenderness.  Musculoskeletal: He exhibits edema and tenderness.  1 + edema with bilateral lower shins and ankles(Improving).  Neurological: He is alert and oriented to person, place, and time.  Motor: 4+/5 throughout, except bilateral ankle dorsi/plantar flexion 4-/5  Skin: Skin is warm and dry. No rash noted.  Ischemic changes to bilateral toes and fingertips (fingertips improving)  Psychiatric: His behavior is normal. Thought content normal. His mood appears anxious. Cognition and memory are normal.  Assessment/Plan: 1.  Functional deficits secondary to debility which require 3+ hours per day of interdisciplinary therapy in a comprehensive inpatient rehab setting. Physiatrist is providing close team supervision and 24 hour management of active medical problems listed below. Physiatrist and rehab team continue to assess barriers to discharge/monitor patient progress toward functional and medical goals.  Function:  Bathing Bathing position   Position: Shower  Bathing parts Body parts bathed by patient: Left arm, Right arm, Chest, Abdomen, Front perineal area, Right upper leg, Left upper leg, Right lower leg, Left lower leg Body parts bathed by helper: Buttocks, Back  Bathing assist Assist Level: Touching or steadying assistance(Pt > 75%)      Upper Body Dressing/Undressing Upper body dressing   What is the patient wearing?: Pull over shirt/dress     Pull over shirt/dress - Perfomed by patient: Thread/unthread right sleeve, Thread/unthread left sleeve, Put head through opening, Pull shirt over trunk          Upper body assist Assist Level: More than reasonable time      Lower Body Dressing/Undressing Lower body dressing   What is the patient wearing?: Underwear, Pants, Non-skid slipper socks Underwear - Performed by patient: Thread/unthread left underwear leg, Thread/unthread right underwear leg, Pull underwear up/down Underwear - Performed by helper: Pull underwear up/down Pants- Performed by patient: Thread/unthread right pants leg, Thread/unthread left pants leg, Fasten/unfasten pants Pants- Performed by helper: Pull pants up/down   Non-skid slipper socks- Performed by helper: Don/doff right sock, Don/doff  left sock                  Lower body assist Assist for lower body dressing: Touching or steadying assistance (Pt > 75%)      Toileting Toileting Toileting activity did not occur: Safety/medical concerns Toileting steps completed by patient: Adjust clothing prior to toileting,  Performs perineal hygiene, Adjust clothing after toileting (per Caryn Bee, NT report)      Toileting assist Assist level: Touching or steadying assistance (Pt.75%)   Transfers Chair/bed transfer   Chair/bed transfer method: Stand pivot Chair/bed transfer assist level: Touching or steadying assistance (Pt > 75%) Chair/bed transfer assistive device: Armrests, Patent attorney     Max distance: 125 Assist level: Moderate assist (Pt 50 - 74%)   Wheelchair   Type: Manual Max wheelchair distance: 150 Assist Level: Touching or steadying assistance (Pt > 75%)  Cognition Comprehension Comprehension assist level: Follows complex conversation/direction with no assist  Expression Expression assist level: Expresses complex 90% of the time/cues < 10% of the time  Social Interaction Social Interaction assist level: Interacts appropriately with others - No medications needed.  Problem Solving Problem solving assist level: Solves complex problems: With extra time  Memory Memory assist level: More than reasonable amount of time, Requires cues to use assistive device    Medical Problem List and Plan: 1. 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 DVT Prophylaxis/Anticoagulation: Pharmaceutical: Lovenox 3. Pain Management:   Continue oxycodone prn.    Neurontin increased to 300 TID on 4/27, Neuropathic pain well controlled today  Educated on spreading out narcotics.  4. Mood: Team to provide ego support.   Cont low dose xanax for prn use.  5. Neuropsych: This patient is capable of making decisions on his own behalf. 6. Skin/Wound Care: Educated on pressure relief measures.   Cont nystatin cream to peri area and dose of diflucan 7. Fluids/Electrolytes/Nutrition: Monitor I/O.   Wife advised to bring food from home.  8. Anemia of critical illness:     CBC Latest Ref Rng 09/28/2015 09/25/2015 09/24/2015  WBC 4.0 - 10.5 K/uL 6.1  5.6 5.4  Hemoglobin 13.0 - 17.0 g/dL 7.8(L) 7.6(L) 7.5(L)  Hematocrit 39.0 - 52.0 % 25.6(L) 24.6(L) 24.1(L)  Platelets 150 - 400 K/uL 270 241 202       Continue iron supplement  Cont to monitor 9. Hypokalemia: continue Magnesium supplement.   Low dose supplement added, increased to 20 on 4/28.    BMP Latest Ref Rng 09/28/2015 09/26/2015 09/25/2015  Glucose 65 - 99 mg/dL 96 96 97  BUN 6 - 20 mg/dL 40(J) 81(X) 91(Y)  Creatinine 0.61 - 1.24 mg/dL 7.82(N) 5.62(Z) 3.08(M)  Sodium 135 - 145 mmol/L 145 144 142  Potassium 3.5 - 5.1 mmol/L 3.6 3.8 3.5  Chloride 101 - 111 mmol/L 112(H) 112(H) 111  CO2 22 - 32 mmol/L Calcium 8.9 - 10.3 mg/dL 5.7(Q) 4.6(N) 6.2(X)    10. Peripheral edema: Diurese prn. Patient and wife educated on monitoring salt intake. Protein supplement added due to low protein stores.  11. Acute renal failure due to ATN: avoid nephrotoxic medications. Continue to monitor.    12. Yeast infection: Treated  LOS (Days) 6 A FACE TO FACE EVALUATION WAS PERFORMED  Claudette Laws E 09/30/2015 11:17 AM

## 2015-09-30 NOTE — Progress Notes (Signed)
Physical Therapy Session Note  Patient Details  Name: John Hoover MRN: [id number] Date of Birth: [birth year]  Today's Date: 09/30/2015 PT Individual Time: 0900-1000 PT Individual Time Calculation (min): 60 min   Short Term Goals: Week 1:  PT Short Term Goal 1 (Week 1): Pt will be able to complete stand pivot/squat pivot transfers with Min A. PT Short Term Goal 2 (Week 1): Pt will be able to transfer sit<>stand with CGA. PT Short Term Goal 3 (Week 1): Pt will ambulate 50 ft with LRAD & min A.  Skilled Therapeutic Interventions/Progress Updates:  Tx focused on therex for stretching and strengthening, gait with RW, and NMR via forced use, manual facilitation, and multi-modal cues.  Pt in bed upone arrival. Discused importance of using RW and other recommended AD for safety and increased activity tolerance. PT not wanting much equipment at home, but was encouraged that it will not be forever. MD came in and suggested possibility of AFO for safey at some point. Bil LEs wrapped for compression.   Therapeutic exercise Pt instructed in supine ankle pumps with DF hold x20 each Reviewed self stretch for gastroc with cues Passive supine HS stretch with knee straight and bent x441min each bil.  Discussed wife providing wrapping and teaching her techniques this week.  Nustep x13 min, level 8 with LEs only for 10 min, focusing on increasing LE ROM.  Handout provided for safety therex during rest breaks while on unit including x10 each for LAQ, marching, ankle ABCs, and lower trunk rotations.    Sit<>stands from various heights with close S/Min A and multiple attempts x8 throughout tx.   Gait in controlled setting 2x150' with close S and manual facilitation for posture. Pt educated on relationship between flexibiltiy and posture.  Functional balance training including 1/2 UEs with safety cues during transitional movements, up to Min A for steadying esp during impulsive movements.   Pt left up in  recliner with cues for posture/alignment.       Therapy Documentation Precautions:  Precautions Precautions: Fall Restrictions Weight Bearing Restrictions: No General:   Vital Signs: Therapy Vitals Temp: 98.6 F (37 C) Temp Source: Oral Pulse Rate: 100 Resp: 18 BP: 131/87 mmHg Patient Position (if appropriate): Lying Oxygen Therapy SpO2: 96 % O2 Device: Not Delivered Pain: none    See Function Navigator for Current Functional Status.   Therapy/Group: Individual Therapy  Dreux Mcgroarty, Chrisandra NettersOLE M  Emil Weigold, PT, DPT  09/30/2015, 8:31 AM

## 2015-10-01 ENCOUNTER — Inpatient Hospital Stay (HOSPITAL_COMMUNITY): Payer: BLUE CROSS/BLUE SHIELD | Admitting: Physical Therapy

## 2015-10-01 ENCOUNTER — Inpatient Hospital Stay (HOSPITAL_COMMUNITY): Payer: BLUE CROSS/BLUE SHIELD

## 2015-10-01 DIAGNOSIS — G6281 Critical illness polyneuropathy: Principal | ICD-10-CM

## 2015-10-01 LAB — CBC WITH DIFFERENTIAL/PLATELET
Basophils Absolute: 0.1 10*3/uL (ref 0.0–0.1)
Basophils Relative: 1 %
Eosinophils Absolute: 0.2 10*3/uL (ref 0.0–0.7)
Eosinophils Relative: 2 %
HEMATOCRIT: 27.4 % — AB (ref 39.0–52.0)
HEMOGLOBIN: 8.1 g/dL — AB (ref 13.0–17.0)
LYMPHS ABS: 3.1 10*3/uL (ref 0.7–4.0)
LYMPHS PCT: 37 %
MCH: 28.7 pg (ref 26.0–34.0)
MCHC: 29.6 g/dL — AB (ref 30.0–36.0)
MCV: 97.2 fL (ref 78.0–100.0)
MONOS PCT: 12 %
Monocytes Absolute: 1 10*3/uL (ref 0.1–1.0)
NEUTROS ABS: 4.2 10*3/uL (ref 1.7–7.7)
NEUTROS PCT: 48 %
Platelets: 328 10*3/uL (ref 150–400)
RBC: 2.82 MIL/uL — ABNORMAL LOW (ref 4.22–5.81)
RDW: 16.5 % — ABNORMAL HIGH (ref 11.5–15.5)
WBC: 8.5 10*3/uL (ref 4.0–10.5)

## 2015-10-01 LAB — BASIC METABOLIC PANEL
Anion gap: 7 (ref 5–15)
BUN: 25 mg/dL — AB (ref 6–20)
CHLORIDE: 110 mmol/L (ref 101–111)
CO2: 24 mmol/L (ref 22–32)
CREATININE: 1.63 mg/dL — AB (ref 0.61–1.24)
Calcium: 8.7 mg/dL — ABNORMAL LOW (ref 8.9–10.3)
GFR calc Af Amer: 54 mL/min — ABNORMAL LOW (ref >60)
GFR calc non Af Amer: 47 mL/min — ABNORMAL LOW (ref >60)
Glucose, Bld: 94 mg/dL (ref 65–99)
POTASSIUM: 4.9 mmol/L (ref 3.5–5.1)
Sodium: 141 mmol/L (ref 135–145)

## 2015-10-01 LAB — CREATININE, SERUM
CREATININE: 1.65 mg/dL — AB (ref 0.61–1.24)
GFR calc Af Amer: 54 mL/min — ABNORMAL LOW (ref >60)
GFR calc non Af Amer: 46 mL/min — ABNORMAL LOW (ref >60)

## 2015-10-01 NOTE — Progress Notes (Signed)
Occupational Therapy Session Note  Patient Details  Name: John Hoover MRN: [id number] Date of Birth: [birth year]  Today's Date: 10/01/2015 OT Individual Time: 1191-47820830-0930 OT Individual Time Calculation (min): 60 min    Short Term Goals: Week 1:  OT Short Term Goal 1 (Week 1): Transfer to toilet with supervision OT Short Term Goal 2 (Week 1): Demonstrate ability to use AE to compensate for impaired FMC at bilateral hands OT Short Term Goal 3 (Week 1): Complete 15 minutes of continuous therex to strengthen bilateral upper/lower body strength and endurance OT Short Term Goal 4 (Week 1): Prepare simple meal using LRAD with min assist OT Short Term Goal 5 (Week 1): Bathe and dress with min assist to perform foot care  Skilled Therapeutic Interventions/Progress Updates: ADL-retraining with foucs on improved FMC during performance of BADL and functional mobility using LRAD.   Pt received seated in w/c and awaiting therapist.   Pt reports improved dexterity in both hands d/t wound healing.  Pt ambulated to bathroom with RW and performed transfer, bathing/dressing, and grooming sitting/standing with overall supervision and setup assist, min assist to apply ace wrap and don non-skid socks.   Pt uses reacher as needed but is now able to reach to his feet to lace his pants without device.   During treatment, OT challenged pt on report of prior CVA to alternate therapist providing treatment during weekend after pt exhibited poor performance with left hand during 9-hole peg test. 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  Pt recalled no previous deficits from his injury other than what he observes as lack of symmetry in both hands.   Plan to monitor left hand function during iADL tasks.  Pt completed all grooming seated with good  thoroughness.       Therapy Documentation Precautions:  Precautions Precautions: Fall Restrictions Weight Bearing Restrictions: No  Vital Signs: Therapy Vitals Pulse Rate: (!) 101 BP: 130/84 mmHg   Pain: Pain Assessment Pain Assessment: 0-10 Pain Score: 5  Pain Location:  (hands & feet) Pain Descriptors / Indicators:  (neuropathic pain) Pain Intervention(s): Repositioned  ADL: ADL ADL Comments: see Functional Assessment Tool  See Function Navigator for Current Functional Status.   Therapy/Group: Individual Therapy  Izac Faulkenberry 10/01/2015, 10:40 AM

## 2015-10-01 NOTE — Plan of Care (Signed)
Problem: RH SKIN INTEGRITY Goal: RH STG SKIN FREE OF INFECTION/BREAKDOWN Patient's skin will remain free from new infection/breakdown while on Rehab.  Outcome: Progressing Necrotic fingers healing, toes still necrotic

## 2015-10-01 NOTE — Progress Notes (Signed)
Physical Therapy Session Note  Patient Details  Name: John Hoover MRN: [id number] Date of Birth: Nov 15, 1965  Today's Date: 10/01/2015 PT Individual Time: [id number] and 1546-1630 PT Individual Time Calculation (min): 72 min and 44 minutes  Short Term Goals: Week 1:  PT Short Term Goal 1 (Week 1): Pt will be able to complete stand pivot/squat pivot transfers with Min A. PT Short Term Goal 2 (Week 1): Pt will be able to transfer sit<>stand with CGA. PT Short Term Goal 3 (Week 1): Pt will ambulate 50 ft with LRAD & min A.  Skilled Therapeutic Interventions/Progress Updates:    Treatment 1: Pt received in recliner & agreeable to PT, reporting 5/10 neuropathic pain in B hands & feet. Reassessed pt's ankle dorsiflexion & pt has minimal 1/5 MMT in BLE. Discussed foot-up brace with patient instead of AFO, discussing potential for muscle return in ankle dorsiflexors. Requested pt have wife bring in tennis shoes to allow pt to trial foot up brace & pt agreeable. PT [facility] shoes for BLE foot protection. Pt ambulated room>gym with RW & supervision, with decreased foot clearance BLE. Utilized cybex kinetron on 20 cm/sec increasing to 10 cm/sec in sitting, with bouts of 4 minutes to 30-60 seconds with increased difficulty. Neuro re-education performed with pt standing without BUE support while reaching outside of BOS in all planes with BUE to obtain & toss horseshoes. Pt reported activity was "a workout" noting muscle fatigue in abdominals & legs. PT educated pt on negotiating stairs backwards with use of RW & provided demonstration. Pt then negotiated stairs backwards with RW with PT stabilizing AD but pt with difficulty clearing BLE while ascending & descending stairs. Pt reported he felt as though he could negotiate stairs without railings or AD & PT assisted pt with trial of this. Pt required mod A for balance as pt unable to advance BLE up stairs without LOB 2/2 poor balance and strength. PT  had extensive conversation with patient regarding safety with d/c home & length of recovery time as pt is concerned about returning to work. Educated pt to be cleared by MD to return to work & thoroughly educated pt on importance of using RW at all times upon d/c for all mobility. Educated pt on length of time it takes for body to recover, and potential risks of falling & causing injury to self if he discharges home & does not abide by PT suggestions to use RW. Utilized [employer] for neuro re-ed & weight shifting training. Pt performed multiple trials of Limits of Stability with BUE/1UE/no UE support. Pt with difficulty shifting weight & required tactile cuing to do so, as well as cuing for posture. Pt then ambulated back to room & left in recliner with all needs within reach.   Treatment 2: Pt received in recliner & agreeable to PT. Pt noted 5/10 BLE neuropathic pain but also reported receiving pain medication earlier. Pt ambulated room>ortho gym ~200 ft with RW & supervision; pt leans on BUE more with fatigue & requires cuing for upright posture during gait. Pt continues to demonstrate foot slap BLE, and increased hip & knee flexion to compensate for poor BLE dorsiflexion. Pt completed car transfer with seat height elevated to simulate pt's personal car. PT instructed pt to sit on seat first then turn BLE into car, as car is tall and stepping into it would be difficult & pt agreeable. PT educated pt that he would not be cleared to drive or work until MD  allows him to. Pt reports he will enter home at front entrance with single step without rails instead of 3 steps without rails. Educated pt on negotiating single step with PT providing demonstration. Pt then able to negotiate single step with RW & supervision, with minimal cuing for foot and RW approximation to step. PT discussed pt's need to use RW at all times upon d/c, even in house when pt inquired about furniture walking. PT educated pt that furniture walking  is unsafe because he may be without BUE support at times. Also informed pt that PT is recommending outpatient PT follow up & therapist in that setting could progress him to more LRAD as appropriate. Pt's wife then arrived for session & PT educated pt on trial of foot up brace & wife reported she would bring in pt's shoes tomorrow. PT educated pt not to wear shoes that are too tight because of his impaired sensation in feet and that could lead to skin breakdown/wounds. Also educated pt on risks of returning to work too soon due to impaired sensation in feet, reinforcing pt's need for MD clearance for work. Therapist then instructed pt & wife on compression wrapping pt's BLE. PT demonstrated on one LE & pt's wife able to demonstrate twice on other LE. Educated pt & wife to perform figure 8 pattern to prevent circumferential wrapping & decrease pt's risk of reducing circulation. Also educated them to not wrap toes & to end Velcro on top of wrapping so it does not cut into pt's skin, as well as need to re-wrap legs every 4 hours or when ace wrap becomes lose. Pt & wife report comfort with compression wrapping pt's legs. Pt then ambulated back to room & on the way noted that his son has helped him with transfers in room. Educated pt & family on need to have therapist sign off on family assistance, but for now pt needs to call for nursing assistance. At end of session pt left in recliner with all needs within reach & family present.   Therapy Documentation Precautions:  Precautions Precautions: Fall Restrictions Weight Bearing Restrictions: No  Pain: Pain Assessment Pain Assessment: 0-10 Pain Score: 5  Pain Location:  (hands & feet) Pain Descriptors / Indicators:  (neuropathic pain) Pain Intervention(s): Repositioned   See Function Navigator for Current Functional Status.   Therapy/Group: Individual Therapy  Sandi Mariscal 10/01/2015, 8:12 AM

## 2015-10-01 NOTE — Progress Notes (Signed)
Physical Therapy Session Note  Patient Details  Name: John Hoover MRN: [id number] Date of Birth: 1965-10-13  Today's Date: 10/01/2015 PT Individual Time: 1500-1530 PT Individual Time Calculation (min): 30 min   Short Term Goals: Week 1:  PT Short Term Goal 1 (Week 1): Pt will be able to complete stand pivot/squat pivot transfers with Min A. PT Short Term Goal 2 (Week 1): Pt will be able to transfer sit<>stand with CGA. PT Short Term Goal 3 (Week 1): Pt will ambulate 50 ft with LRAD & min A.  Skilled Therapeutic Interventions/Progress Updates:   Per OT report pt has h/o Meniere's disease and has been reporting impaired equilibrium and occasional LOB during standing and gait training.  Requested this PT to assess vestibular function and provide education/treatment if needed.  Pt performed ambulation to gym with RW and supervision.  Pt reports last episode of Meniere's was 8 years ago and he has had no issues since. Pt now reports intermittent episodes of sudden L LOB with assistance to self-correct.  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  Pt denies vertigo or oscillopsia.  Quick screen of oculomotor and vestibular system; all WFL except saccades which were very delayed but conjugate and on target.  Performed assessment of VOR with x 1 viewing in sitting and standing with no impairments or LOB.  Performed gait with and without RW during vertical and horizontal head turns with supervision-min A and no LOB.  Performed MCTSIB with pt able to maintain condition 1 x 30 seconds, condition 2 x 30 seconds on second attempt, condition 3 x 30 seconds and condition 4 (compliant surface, eyes closed-full reliance on vestibular system) unable to maintain balance x 3 attempts which may indicate impairments in vestibular system and heavy reliance on vision and somatosensory information.  Somatosensory and ankle balance reactions impaired currently due to critical  illness.  Educated pt on implications of reliance on vision and sensory and current impairments in sensory and strength-increased falls risk in dark or on compliant surface.  Advised pt to use light at night, increased use of vision when on compliant surfaces and to have second person with him if ambulating outside.  Pt verbalized understanding.  Pt requesting to attempt to progress to less restrictive AD.  Provided pt education on use of SPC.  Performed gait x 150' with SPC in LUE, due to weakness in RLE, with mod A overall with hand over hand assistance to sequence advancing SPC with RLE and for balance due to multiple LOB forwards and to the L.  Returned to room and RW returned to room; pt left in recliner with friends and family present.    Therapy Documentation Precautions:  Precautions Precautions: Fall Restrictions Weight Bearing Restrictions: No Vital Signs: Therapy Vitals Temp: 97.8 F (36.6 C) Temp Source: Oral Pulse Rate: (!) 104 Resp: 18 BP: (!) 131/91 mmHg Patient Position (if appropriate): Sitting Oxygen Therapy SpO2: 100 % O2 Device: Not Delivered Pain: Pain Assessment Pain Assessment: 0-10 Pain Score: 4  Pain Type: Neuropathic pain Pain Location: Foot Pain Orientation: Right;Left Pain Descriptors / Indicators: Tingling;Burning;Pins and needles Pain Frequency: Intermittent Pain Onset: Gradual Pain Intervention(s): Medication (See eMAR)  See Function Navigator for Current Functional Status.   Therapy/Group: Individual Therapy  Edman CircleHall, Audra [facility] 10/01/2015, 4:58 PM

## 2015-10-01 NOTE — Progress Notes (Signed)
Social Work Patient ID: Peterson AoWilliam E Nocera, male   DOB: 06/09/1965, 50 y.o.   MRN: [id number] Insurance [place] [employer] for pt continued stay until 5/5. Await approval.

## 2015-10-01 NOTE — Progress Notes (Signed)
[place] PHYSICAL MEDICINE & REHABILITATION     PROGRESS NOTE  Subjective/Complaints:  Eating up in bed this morning. He notes improvement in strength in his ankles and states he had a good weekend with his birthday being on Saturday and family and friends coming to celebrate.  ROS: + Mild Neuropathic pain. denies CP, SOB, nausea, vomiting, diarrhea  Objective: Vital Signs: Blood pressure 130/84, pulse 101, temperature 98.7 F (37.1 C), temperature source Oral, resp. rate 18, height 6\' 1"  (1.854 m), weight 90.9 kg (200 lb 6.4 oz), SpO2 98 %. No results found. No results for input(s): WBC, HGB, HCT, PLT in the last 72 hours.  Recent Labs  10/01/15 0501  CREATININE 1.65*   CBG (last 3)  No results for input(s): GLUCAP in the last 72 hours.  Wt Readings from Last 3 Encounters:  10/01/15 90.9 kg (200 lb 6.4 oz)  09/12/15 100.5 kg (221 lb 9 oz)  08/27/15 91.899 kg (202 lb 9.6 oz)    Physical Exam:  BP 130/84 mmHg  Pulse 101  Temp(Src) 98.7 F (37.1 C) (Oral)  Resp 18  Ht 6\' 1"  (1.854 m)  Wt 90.9 kg (200 lb 6.4 oz)  BMI 26.45 kg/m2  SpO2 98% Constitutional: He appears well-developed and well-nourished. No distress.  HENT: Normocephalic and atraumatic.  Eyes: Conjunctivae and EOM are normal.   Cardiovascular: + Tachycardia. Regular rhythm.  Respiratory: Effort normal. No respiratory distress. Clear to ausculation.  GI: Soft. Bowel sounds are normal. He exhibits no distension. There is no tenderness.  Musculoskeletal: He exhibits edema and tenderness in bilateral lower extremities.  1 + edema with bilateral lower shins and ankles(Improving).  Neurological: He is alert and oriented to person, place, and time.  Motor: 4+/5 throughout, except bilateral ankle dorsi/plantar flexion 4/5  Skin: Skin is warm and dry. No rash noted.  Ischemic changes to bilateral toes and fingertips (fingertips almost resolved)  Psychiatric: His behavior is normal. Thought content normal. His  mood appears anxious. Cognition and memory are normal.  Assessment/Plan: 1. Functional deficits secondary to debility which require 3+ hours per day of interdisciplinary therapy in a comprehensive inpatient rehab setting. Physiatrist is providing close team supervision and 24 hour management of active medical problems listed below. Physiatrist and rehab team continue to assess barriers to discharge/monitor patient progress toward functional and medical goals.  Function:  Bathing Bathing position   Position: Shower  Bathing parts Body parts bathed by patient: Left arm, Right arm, Chest, Abdomen, Front perineal area, Right upper leg, Left upper leg, Right lower leg, Left lower leg, Buttocks Body parts bathed by helper: Back  Bathing assist Assist Level: Touching or steadying assistance(Pt > 75%)      Upper Body Dressing/Undressing Upper body dressing   What is the patient wearing?: Pull over shirt/dress     Pull over shirt/dress - Perfomed by patient: Thread/unthread right sleeve, Thread/unthread left sleeve, Put head through opening, Pull shirt over trunk          Upper body assist Assist Level: More than reasonable time      Lower Body Dressing/Undressing Lower body dressing   What is the patient wearing?: Underwear, Pants, Non-skid slipper socks, Shoes Underwear - Performed by patient: Pull underwear up/down, Thread/unthread left underwear leg, Thread/unthread right underwear leg Underwear - Performed by helper: Pull underwear up/down Pants- Performed by patient: Thread/unthread right pants leg, Thread/unthread left pants leg, Fasten/unfasten pants Pants- Performed by helper: Pull pants up/down Non-skid slipper socks- Performed by patient: Don/doff right  sock, Don/doff left sock Non-skid slipper socks- Performed by helper: Don/doff right sock, Don/doff left sock     Shoes - Performed by patient: Don/doff right shoe, Don/doff left shoe            Lower body assist  Assist for lower body dressing: Supervision or verbal cues      Toileting Toileting Toileting activity did not occur: Safety/medical concerns Toileting steps completed by patient: Adjust clothing prior to toileting, Performs perineal hygiene, Adjust clothing after toileting (per Caryn Bee, NT report)      Toileting assist Assist level: Touching or steadying assistance (Pt.75%)   Transfers Chair/bed transfer   Chair/bed transfer method: Stand pivot Chair/bed transfer assist level: Touching or steadying assistance (Pt > 75%) Chair/bed transfer assistive device: Armrests, Patent attorney     Max distance: 150 Assist level: Touching or steadying assistance (Pt > 75%)   Wheelchair   Type: Manual Max wheelchair distance: 150 Assist Level: Touching or steadying assistance (Pt > 75%)  Cognition Comprehension Comprehension assist level: Follows complex conversation/direction with no assist  Expression Expression assist level: Expresses complex 90% of the time/cues < 10% of the time  Social Interaction Social Interaction assist level: Interacts appropriately with others - No medications needed.  Problem Solving Problem solving assist level: Solves complex problems: With extra time  Memory Memory assist level: More than reasonable amount of time, Requires cues to use assistive device    Medical Problem List and Plan: 1. 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 DVT Prophylaxis/Anticoagulation: Pharmaceutical: Lovenox 3. Pain Management:   Continue oxycodone prn.    Neurontin increased to 300 TID on 4/27  Educated on spreading out narcotics.  4. Mood: Team to provide ego support.   Cont low dose xanax for prn use.  5. Neuropsych: This patient is capable of making decisions on his own behalf. 6. Skin/Wound Care: Educated on pressure relief measures.   Cont nystatin cream to peri area and dose of diflucan 7.  Fluids/Electrolytes/Nutrition: Monitor I/O.   Wife advised to bring food from home.  8. Anemia of critical illness:   Hemoglobin 7.8 on 4/28  Continue iron supplement  Cont to monitor 9. Hypokalemia: continue Magnesium supplement.   Low dose supplement added, increased to 20 on 4/28.  10. Peripheral edema: Diurese prn. Patient and wife educated on monitoring salt intake. Protein supplement added due to low protein stores.  11. Acute renal failure due to ATN: avoid nephrotoxic medications. Continue to monitor.   Creatinine 1.65 on 5/1 12. Yeast infection: Treated  LOS (Days) 7 A FACE TO FACE EVALUATION WAS PERFORMED  Ankit Karis Juba 10/01/2015 9:00 AM

## 2015-10-02 ENCOUNTER — Inpatient Hospital Stay (HOSPITAL_COMMUNITY): Payer: BLUE CROSS/BLUE SHIELD | Admitting: Occupational Therapy

## 2015-10-02 ENCOUNTER — Inpatient Hospital Stay (HOSPITAL_COMMUNITY): Payer: BLUE CROSS/BLUE SHIELD | Admitting: *Deleted

## 2015-10-02 ENCOUNTER — Inpatient Hospital Stay (HOSPITAL_COMMUNITY): Payer: BLUE CROSS/BLUE SHIELD | Admitting: Physical Therapy

## 2015-10-02 ENCOUNTER — Inpatient Hospital Stay (HOSPITAL_COMMUNITY): Payer: BLUE CROSS/BLUE SHIELD

## 2015-10-02 NOTE — Progress Notes (Signed)
Occupational Therapy Session Note  Patient Details  Name: John Hoover MRN: [id number] Date of Birth: 1966-05-01  Today's Date: 10/02/2015 OT Individual Time: 7026-3785 OT Individual Time Calculation (min): 58 min    Short Term Goals: Week 1:  OT Short Term Goal 1 (Week 1): Transfer to toilet with supervision OT Short Term Goal 2 (Week 1): Demonstrate ability to use AE to compensate for impaired FMC at bilateral hands OT Short Term Goal 3 (Week 1): Complete 15 minutes of continuous therex to strengthen bilateral upper/lower body strength and endurance OT Short Term Goal 4 (Week 1): Prepare simple meal using LRAD with min assist OT Short Term Goal 5 (Week 1): Bathe and dress with min assist to perform foot care  Skilled Therapeutic Interventions/Progress Updates:    Pt seen for skilled OT to facilitate dynamic balance, sit to stand skills and activity tolerance. Pt discussed BR equipment needs/ recommendations. Discussed that he does not want a BSC over toilet, recommended toilet safety rails.  Discussed with pt that goals of this session would be to work on sit to stand skills to decrease need for any toilet rails/ equipment.  Pt ambulated room to therapy gym with cues to not over grip RW handles but to just glide it along for balance. In gym, pt participated in graded activity from high mat to moderate height (just above normal chair height level) of sit >< stand with no UE support. Once pt able to accomplish this well on mod height mat, added in ball holds with overhead reaching. Alternated standing activities with UE AROM ex to increase posture and sh ROM.  UE strengthening with 5 lb dowel bar.  Pt ambulated back to room with all needs met.  Therapy Documentation Precautions:  Precautions Precautions: Fall Restrictions Weight Bearing Restrictions: No    Vital Signs: Therapy Vitals Temp: 98.1 F (36.7 C) Temp Source: Oral Pulse Rate: (!) 104 Resp: 18 BP: 132/88 mmHg Patient  Position (if appropriate): Lying Oxygen Therapy SpO2: 99 % O2 Device: Not Delivered Pain: Pain Assessment Pain Assessment: 0-10 Pain Score: 8  Pain Type: Acute pain Pain Location: Foot Pain Orientation: Right;Left Pain Descriptors / Indicators: Tingling;Burning Pain Onset: Gradual Pain Intervention(s): Medication (See eMAR) ADL: ADL ADL Comments: see Functional Assessment Tool    See Function Navigator for Current Functional Status.   Therapy/Group: Individual Therapy  [place] 10/02/2015, 9:03 AM

## 2015-10-02 NOTE — Progress Notes (Signed)
Physical Therapy Weekly Progress Note  Patient Details  Name: John Hoover MRN: [id number] Date of Birth: Feb 06, 1966 Treatment time (individual): [phone] Total treatment minutes: 71 minutes  PT Short Term Goals Week 1:  PT Short Term Goal 1 (Week 1): Pt will be able to complete stand pivot/squat pivot transfers with Min A. PT Short Term Goal 2 (Week 1): Pt will be able to transfer sit<>stand with CGA. PT Short Term Goal 3 (Week 1): Pt will ambulate 50 ft with LRAD & min A.  Skilled Therapeutic Interventions/Progress Updates:    Pt received in recliner & agreeable to PT, reporting 6-7/10 B hands & feet neuropathic pain but denying pain medication. Pt had shoes in room but they do not fit feet 2/2 edema. Asked pt to have family member bring in larger size shoe as soon as possible to allow pt to trial foot up brace. Pt completed transfers with supervision A throughout session, occasionally requiring cuing for hand placement on stable surface instead of RW when performing transfer. Pt able to ambulate room>gym with RW & supervision; pt presents with decreased dorsiflexion during swing phase BLE, as well as foot slap BLE. Utilized Interior and spatial designerWii Fit balance games to work on Raytheonweight shifting in all directions. Pt with great difficulty shifting weight, as he tends to move shoulders instead of pelvis. PT provided tactile cuing & manual facilitation to assist pt with shifting weight mainly to L with pt noting he feels as though he is going to fall when shifting weight instead of moving shoulders. PT educated pt on purpose of task and need to increase ability to shift weight to allow pt to react to changes in body position & decrease risk of falls. Pt noted tightness in calves & PT instructed pt on dorsiflexion stretch with sheet. Pt able to perform stretch 3x on BLE for 60 seconds each; PT educated pt to make sure he feels a stretch but does not feel pain. Pt ambulated to rehab apartment & demonstrated bed mobility  (rolling L<>R & supine<>sit) with Mod I. Pt again discussed that he did not want equipment at home after d/c, specifically BSC. PT thoroughly educated pt on need for adaptive equipment to ensure tasks with mobility upon d/c & pt voiced understanding of this but still does not want to have to use equipment. Pt attempted sit>stand from apartment bed without use of BUE & only able to complete 1 transfer with multiple attempts, and could not complete more transfers after due to weakness in BLE. Pt demonstrated couch transfer with supervision, with PT cuing pt to hold on to stable surface instead of RW when transferring stand>sit & pt voiced understanding. Pt unable to transfer off of couch without BUE & still required significantly extra time to complete transfer when pushing with 1 UE. Pt then returned to gym & utilized nu-step on Level 6 x 5 minutes with BLE only; pt reported 13 on Borg RPE scale during activity & reported fatigue afterwards. At end of session pt ambulated back to room & left in recliner with all needs reach.  Therapy Documentation Precautions:  Precautions Precautions: Fall Restrictions Weight Bearing Restrictions: No Pain: Pain Assessment Pain Assessment: 0-10 Pain Score: 6  (6-7) Pain Location:  (fingers & hands neuropathic pain) Pain Intervention(s): RN made aware;Repositioned;Ambulation/increased activity   See Function Navigator for Current Functional Status.  Therapy/Group: Individual Therapy  Sandi MariscalVictoria M Miller 10/02/2015, 1:14 PM

## 2015-10-02 NOTE — Plan of Care (Signed)
Problem: RH Stairs Goal: LTG Patient will ambulate up and down stairs w/assist (PT) LTG: Patient will ambulate up and down # of stairs with assistance (PT)  1 step without rail & LRAD; updated 2/2 pt's home environment & plan

## 2015-10-02 NOTE — Progress Notes (Signed)
Recreational Therapy Assessment and Plan  Patient Details  Name: John Hoover MRN: [id number] Date of Birth: 1966/05/20 Today's Date: 10/02/2015  Rehab Potential: Good  ELOS: 3 days  Assessment Clinical Impression:  Problem List:  Patient Active Problem List   Diagnosis Date Noted  . Debility 09/24/2015  . Weakness   . Abnormality of gait   . Pain   . Neuropathic pain   . Anxiety state   . Yeast infection   . Anemia of chronic disease   . Hypokalemia   . Peripheral edema   . ATN (acute tubular necrosis) ([place])   . Scrotal edema   . Thrombocytopenia ([place])   . MODS (multiple organ dysfunction syndrome)   . Endotracheally intubated   . Hypovolemic shock ([place])   . Acute respiratory failure ([place])   . AKI (acute kidney injury) ([place])   . Lactic acidosis   . Hypoxia 08/27/2015  . Rhabdomyolysis 08/27/2015  . Hyperkalemia 08/27/2015  . ARF (acute renal failure) ([place]) 08/27/2015  . Routine general medical examination at a health care facility 08/23/2014  . Advance care planning 08/23/2014  . Meniere disease 04/08/2011  . ALLERGIC RHINITIS, SEASONAL 12/02/2007    Past Medical History:  Past Medical History  Diagnosis Date  . Meniere's disease    Past Surgical History:  Past Surgical History  Procedure Laterality Date  . Nasal sinus surgery      Assessment & Plan Clinical Impression: Patient is a 50 y.o. year old male originally admitted to [facility] on 08/27/15 with malaise and myalgias due to influenza with AKI and rhabdomyolysis. Hospital course complicated with MODS due to septic shock and respiratory failure requiring intubation. He has required CRRT due to renal failure with volume overload as well as pressors due to hypotension. He has also had issues with agitation requiring sedation, heme positive stools, thrombocytopenia, anemia requiring multiple units PRBC,  lethargy and deconditioning. Dr. Delon [place] [facility] consulted and work consistent with platelets decline due to sepsis and retic count low due to likely due to suppression. Has been on muliple antibiotics with respiratory cultures growing enterococcus faecalis and stenotrophomonas as well as stress dose steriods. He was transferred to [facility] on 04/13 for rehab. Wife has been assisting with lymphedema wraps to help with peripheral edema. Renal status improving and he has been weaned off oxygen. Patient with fever on 04/20 and pan cultured. Cefepime added due to concerns of PNA and to continue for a week. Therapy ongoing and patient showing improvement in activity tolerance. CIR recommended for follow up therapy. Patient transferred to CIR on 09/24/2015.   Pt presents with decreased activity tolerance, decreased functional mobility, decreased balance, decreased coordination Limiting pt's independence with leisure/community pursuits.    Session focused on education about use of leisure time post discharge, activity analysis with potential adaptations/modifications, energy conservation techniques& community reintegration.  Pt referred by team for participation in community reintegration & is agreeable to participate in an outing tomorrow.  Purpose of the outing & potential goals discussed.        Leisure History/Participation Premorbid leisure interest/current participation: [place] care Expression Interests: Dance;Music (Comment) Other Leisure Interests: Television;Cooking/Baking (grilling) Leisure Participation Style: With Family/Friends Awareness of Community Resources: Excellent Psychosocial / Spiritual Spiritual Interests: Church Social interaction - Mood/Behavior: Cooperative Academic librarian Appropriate for Education?: Yes Patient Agreeable to Outing?: Yes Strengths/Weaknesses Patient Strengths/Abilities:  Willingness to participate;Active premorbidly Patient weaknesses: Physical limitations TR Patient demonstrates impairments in the following  area(s): Endurance;Motor;Safety  Plan    Recommendations for other services: Neuropsych  Discharge Criteria: Patient will be discharged from TR if patient refuses treatment 3 consecutive times without medical reason.  If treatment goals not met, if there is a change in medical status, if patient makes no progress towards goals or if patient is discharged from hospital.  The above assessment, treatment plan, treatment alternatives and goals were discussed and mutually agreed upon: by patient  [place] 10/02/2015, 3:28 PM

## 2015-10-02 NOTE — Progress Notes (Signed)
[place] PHYSICAL MEDICINE & REHABILITATION     PROGRESS NOTE  Subjective/Complaints:  Patient working with OT this morning. He notes improvement in his neuropathy, specifically in his hands on a daily basis. 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 denies CP, SOB, nausea, vomiting, diarrhea  Objective: Vital Signs: Blood pressure 132/88, pulse 104, temperature 98.1 F (36.7 C), temperature source Oral, resp. rate 18, height 6\' 1"  (1.854 m), weight 90.9 kg (200 lb 6.4 oz), SpO2 99 %. No results found.  Recent Labs  10/01/15 1147  WBC 8.5  HGB 8.1*  HCT 27.4*  PLT 328    Recent Labs  10/01/15 0501 10/01/15 1147  NA  --  141  K  --  4.9  CL  --  110  GLUCOSE  --  94  BUN  --  25*  CREATININE 1.65* 1.63*  CALCIUM  --  8.7*   CBG (last 3)  No results for input(s): GLUCAP in the last 72 hours.  Wt Readings from Last 3 Encounters:  10/02/15 90.9 kg (200 lb 6.4 oz)  09/12/15 100.5 kg (221 lb 9 oz)  08/27/15 91.899 kg (202 lb 9.6 oz)    Physical Exam:  BP 132/88 mmHg  Pulse 104  Temp(Src) 98.1 F (36.7 C) (Oral)  Resp 18  Ht 6\' 1"  (1.854 m)  Wt 90.9 kg (200 lb 6.4 oz)  BMI 26.45 kg/m2  SpO2 99% Constitutional: He appears well-developed and well-nourished. No distress.  HENT: Normocephalic and atraumatic.  Eyes: Conjunctivae and EOM are normal.   Cardiovascular: + Tachycardia. Regular rhythm.  Respiratory: Effort normal. No respiratory distress. Clear to ausculation.  GI: Soft. Bowel sounds are normal. He exhibits no distension. There is no tenderness.  Musculoskeletal: He exhibits edema and tenderness in bilateral lower extremities.  1 + edema with bilateral lower shins and ankles (Improving).  Neurological: He is alert and oriented to person, place, and time.  Motor: 4+/5 throughout, except bilateral ankle dorsi/plantar flexion 4/5   Skin: Skin is warm and dry. No rash noted.  Ischemic changes to bilateral toes and fingertips (fingertips almost resolved)  Psychiatric: His behavior is normal. Thought content normal. Cognition and memory are normal.  Assessment/Plan: 1. Functional deficits secondary to debility which require 3+ hours per day of interdisciplinary therapy in a comprehensive inpatient rehab setting. Physiatrist is providing close team supervision and 24 hour management of active medical problems listed below. Physiatrist and rehab team continue to assess barriers to discharge/monitor patient progress toward functional and medical goals.  Function:  Bathing Bathing position   Position: Shower  Bathing parts Body parts bathed by patient: Right arm, Left arm, Chest, Front perineal area, Abdomen, Buttocks, Right upper leg, Left upper leg, Right lower leg, Left lower leg Body parts bathed by helper: Back  Bathing assist Assist Level: No help, No cues      Upper Body Dressing/Undressing Upper body dressing   What is the patient wearing?: Pull over shirt/dress     Pull over shirt/dress - Perfomed by patient: Thread/unthread right sleeve, Thread/unthread left sleeve, Put head through opening, Pull shirt over trunk          Upper body assist Assist Level: More than reasonable time      Lower Body Dressing/Undressing Lower body dressing   What is the patient wearing?: Underwear, Pants, Non-skid slipper socks, Shoes Underwear -  Performed by patient: Thread/unthread right underwear leg, Thread/unthread left underwear leg, Pull underwear up/down Underwear - Performed by helper: Pull underwear up/down Pants- Performed by patient: Thread/unthread left pants leg, Thread/unthread right pants leg, Pull pants up/down, Fasten/unfasten pants Pants- Performed by helper: Pull pants up/down Non-skid slipper socks- Performed by patient: Don/doff right sock, Don/doff left sock Non-skid slipper socks- Performed by  helper: Don/doff right sock, Don/doff left sock     Shoes - Performed by patient: Don/doff right shoe, Don/doff left shoe            Lower body assist Assist for lower body dressing: Touching or steadying assistance (Pt > 75%)      Toileting Toileting Toileting activity did not occur: Safety/medical concerns Toileting steps completed by patient: Adjust clothing prior to toileting, Performs perineal hygiene, Adjust clothing after toileting      Toileting assist Assist level: No help/no cues   Transfers Chair/bed transfer   Chair/bed transfer method: Stand pivot Chair/bed transfer assist level: Touching or steadying assistance (Pt > 75%) Chair/bed transfer assistive device: Armrests, Patent attorney     Max distance: 200 ft Assist level: Supervision or verbal cues   Wheelchair   Type: Manual Max wheelchair distance: 150 Assist Level: Touching or steadying assistance (Pt > 75%)  Cognition Comprehension Comprehension assist level: Follows complex conversation/direction with no assist  Expression Expression assist level: Expresses complex ideas: With extra time/assistive device  Social Interaction Social Interaction assist level: Interacts appropriately with others - No medications needed.  Problem Solving Problem solving assist level: Solves complex problems: With extra time  Memory Memory assist level: More than reasonable amount of time, Requires cues to use assistive device    Medical Problem List and Plan: 1. 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 DVT Prophylaxis/Anticoagulation: Pharmaceutical: Lovenox 3. Pain Management:   Continue oxycodone prn.    Neurontin increased to 300 TID on 4/27  Educated on spreading out narcotics.  4. Mood: Team to provide ego support.   Cont low dose xanax for prn use.  5. Neuropsych: This patient is capable of making decisions on his own behalf. 6. Skin/Wound Care: Educated on  pressure relief measures.   Cont nystatin cream to peri area and dose of diflucan 7. Fluids/Electrolytes/Nutrition: Monitor I/O.   Wife advised to bring food from home.  8. Anemia of critical illness:   Hemoglobin 8.1 on 5/1  Continue iron supplement  Cont to monitor 9. Hypokalemia: continue Magnesium supplement.   Low dose supplement added, increased to 20 on 4/28, DC'd on 5/2.  Labs ordered for tomorrow  10. Peripheral edema: Diurese prn. Patient and wife educated on monitoring salt intake. Protein supplement added due to low protein stores.  11. Acute renal failure due to ATN: avoid nephrotoxic medications. Continue to monitor.   Creatinine 1.63 on 5/1 12. Yeast infection: Treated  LOS (Days) 8 A FACE TO FACE EVALUATION WAS PERFORMED  Ankit Karis Juba 10/02/2015 8:43 AM

## 2015-10-02 NOTE — Progress Notes (Signed)
Occupational Therapy Weekly Progress Note  Patient Details  Name: John Hoover MRN: [id number] Date of Birth: 1966-05-13  Beginning of progress report period: September 25, 2015 End of progress report period: Oct 02, 2015  Today's Date: 10/02/2015 OT Individual Time: 9233-0076 OT Individual Time Calculation (min): 75 min    Patient has met 5 of 5 short term goals.   Pt continues to make good progress with performance of self-care but requires continued support to problem-solve in anticipation of discharge.   Pt has been repeatedly advised that returning to work before recovering sensation and strength in lower extremities increases risk for fall and related serious injuries.   Pt requests reinforcement from all providers.  Patient continues to demonstrate the following deficits: Impaired dexterity, impaired mobility, impaired endurance, impaired balance, generalized weakness and therefore will continue to benefit from skilled OT intervention to enhance overall performance with BADL and iADL.  Patient progressing toward long term goals..  Continue plan of care.  OT Short Term Goals Week 1:  OT Short Term Goal 1 (Week 1): Transfer to toilet with supervision OT Short Term Goal 1 - Progress (Week 1): Met OT Short Term Goal 2 (Week 1): Demonstrate ability to use AE to compensate for impaired FMC at bilateral hands OT Short Term Goal 2 - Progress (Week 1): Met OT Short Term Goal 3 (Week 1): Complete 15 minutes of continuous therex to strengthen bilateral upper/lower body strength and endurance OT Short Term Goal 3 - Progress (Week 1): Met OT Short Term Goal 4 (Week 1): Prepare simple meal using LRAD with min assist OT Short Term Goal 4 - Progress (Week 1): Met OT Short Term Goal 5 (Week 1): Bathe and dress with min assist to perform foot care OT Short Term Goal 5 - Progress (Week 1): Met Week 2:  OT Short Term Goal 1 (Week 2): STG=LTG d/t short remaining LOS  Skilled Therapeutic  Interventions/Progress Updates: ADL-retraining with focus on improved awarenss, problem-solving, functional mobility, and lower body dressing skills.   Pt completes bed mobility and ambulates in room to gather clothing and supplies for bathing/dressing/grooming.   Pt toilets first and progresses to bathing, seated on 3:1 placed in shower.   During session pt educated on use of DME (3:1 and shower chair) and agreed to need of 3:1 over his toilet and to place in his shower d/t risk of fall and difficulty with sit<>stand from low sitting position.   Pt performs all self-care unassisted with supervision to problem-solve.   OT provided min assist for foot care but educated pt on use of mirror to inspect his own feet.    Pt then completed light home making task, making his bed with min guard assist to maintain standing balance and supervision for technique.         Therapy Documentation Precautions:  Precautions Precautions: Fall Restrictions Weight Bearing Restrictions: No  Vital Signs:    Pain: Pain Assessment Pain Assessment: No/denies pain  ADL: ADL ADL Comments: see Functional Assessment Tool   See Function Navigator for Current Functional Status.   Therapy/Group: Individual Therapy  [place] 10/02/2015, 1:08 PM

## 2015-10-03 ENCOUNTER — Inpatient Hospital Stay (HOSPITAL_COMMUNITY): Payer: BLUE CROSS/BLUE SHIELD | Admitting: Physical Therapy

## 2015-10-03 ENCOUNTER — Inpatient Hospital Stay (HOSPITAL_COMMUNITY): Payer: BLUE CROSS/BLUE SHIELD | Admitting: Occupational Therapy

## 2015-10-03 LAB — BASIC METABOLIC PANEL
ANION GAP: 10 (ref 5–15)
BUN: 22 mg/dL — ABNORMAL HIGH (ref 6–20)
CHLORIDE: 109 mmol/L (ref 101–111)
CO2: 25 mmol/L (ref 22–32)
Calcium: 8.6 mg/dL — ABNORMAL LOW (ref 8.9–10.3)
Creatinine, Ser: 1.7 mg/dL — ABNORMAL HIGH (ref 0.61–1.24)
GFR calc Af Amer: 52 mL/min — ABNORMAL LOW (ref >60)
GFR calc non Af Amer: 45 mL/min — ABNORMAL LOW (ref >60)
GLUCOSE: 93 mg/dL (ref 65–99)
POTASSIUM: 4.8 mmol/L (ref 3.5–5.1)
Sodium: 144 mmol/L (ref 135–145)

## 2015-10-03 NOTE — Progress Notes (Signed)
Occupational Therapy Session Note  Patient Details  Name: John Hoover MRN: [id number] Date of Birth: 01-06-1966  Today's Date: 10/03/2015 OT Individual Time: 0905-1000 OT Individual Time Calculation (min): 55 min    Short Term Goals: Week 1:  OT Short Term Goal 1 (Week 1): Transfer to toilet with supervision OT Short Term Goal 1 - Progress (Week 1): Met OT Short Term Goal 2 (Week 1): Demonstrate ability to use AE to compensate for impaired FMC at bilateral hands OT Short Term Goal 2 - Progress (Week 1): Met OT Short Term Goal 3 (Week 1): Complete 15 minutes of continuous therex to strengthen bilateral upper/lower body strength and endurance OT Short Term Goal 3 - Progress (Week 1): Met OT Short Term Goal 4 (Week 1): Prepare simple meal using LRAD with min assist OT Short Term Goal 4 - Progress (Week 1): Met OT Short Term Goal 5 (Week 1): Bathe and dress with min assist to perform foot care OT Short Term Goal 5 - Progress (Week 1): Met Week 2:  OT Short Term Goal 1 (Week 2): STG=LTG d/t short remaining LOS  Skilled Therapeutic Interventions/Progress Updates:    1:1 self care retraining at shower level. Discussed need for shoes on when ambulating to protect feet due to decr sensation and decr skin integrity. Pt able to shower sit to stand on a BSC and dressed at sink with distant supervision to mod I. Wrapped pt's bilateral LEs with ace wraps and needed A to don shoes due to edema and to try to preserve ace wrapping. Pt can complete grooming I in sitting. Continued education about elevating LEs at rest. Discussed goals of outing and prepared pt for using energy conservation techniques.    Therapy Documentation Precautions:  Precautions Precautions: Fall Restrictions Weight Bearing Restrictions: No Pain: Pain Assessment Pain Assessment: No/denies pain Faces Pain Scale: Hurts a little bit ADL: ADL ADL Comments: see Functional Assessment Tool  See Function Navigator for Current  Functional Status.   Therapy/Group: Individual Therapy  Willeen Cass [facility] 10/03/2015, 9:19 AM

## 2015-10-03 NOTE — Progress Notes (Signed)
Physical Therapy Session Note  Patient Details  Name: John Hoover MRN: [id number] Date of Birth: 09/16/1965  Today's Date: 10/03/2015 PT Individual Time: 4045-9136 PT Individual Time Calculation (min): 30 min   Short Term Goals: Week 1:  PT Short Term Goal 1 (Week 1): Pt will be able to complete stand pivot/squat pivot transfers with Min A. PT Short Term Goal 2 (Week 1): Pt will be able to transfer sit<>stand with CGA. PT Short Term Goal 3 (Week 1): Pt will ambulate 50 ft with LRAD & min A.  Skilled Therapeutic Interventions/Progress Updates:    Patient received sitting in recliner and agreeable to PT. Treatment focused on gait and transfer training to prepare for D/C. Patient performed sit<>stand x10 throughout treatment session with supervision A with use of RW. Min cues provided for UE placement for improved safety with transfer. Gait training x 125, 150 with RW and supervision A. PT provided min cues to increase step width and to hip flexion to prevent foot drag. PT assessed patients gait with foot-brace on LLE and RW for 36f x 2 and 1240f patient noted to have improved foot clearance, increased step length and decreased foot slap. Gait speed assessed with foot-up brace: 0.6619m Gait speed assessed without brace: 0.30m20mPatient returned to room and left sitting in recliner with all needs met.     Therapy Documentation Precautions:  Precautions Precautions: Fall Restrictions Weight Bearing Restrictions: No Pain: Pain Assessment Pain Score: 7    See Function Navigator for Current Functional Status.   Therapy/Group: Individual Therapy  AustLorie Phenix/2017, 6:46 PM

## 2015-10-03 NOTE — Progress Notes (Signed)
Physical Therapy Session Note  Patient Details  Name: John Hoover MRN: [id number] Date of Birth: 29-Nov-1965  Today's Date: 10/03/2015 PT Individual Time: 1000-1200 PT Individual Time Calculation (min): 120 min   Short Term Goals: Week 1:  PT Short Term Goal 1 (Week 1): Pt will be able to complete stand pivot/squat pivot transfers with Min A. PT Short Term Goal 2 (Week 1): Pt will be able to transfer sit<>stand with CGA. PT Short Term Goal 3 (Week 1): Pt will ambulate 50 ft with LRAD & min A.  Skilled Therapeutic Interventions/Progress Updates:    Pt participated in community outing to [place] with this therapist and Recreational therapist. Pt met all goals on goal sheet; please refer to sheet in patient's chart. Pt able to negotiate even & uneven terrain, step over obstacles, reach to high & low shelves all with supervision A. Discussed energy conservation as well as ways to modify functional tasks with patient. Pt able to negotiate handicap accessible and regular stall bathroom; educated pt on benefits of using handicap accessible bathroom to have grab bars with low toilets. When leaving store pt with difficulty flexing hips & knees enough to step into van & required Mod A to prevent loss of balance. After returning to rehab unit PT demonstrated floor transfer & pt then able to perform activity with Min A + 2, assistance to advance LLE to allow him to push up on & cuing for sequencing. Educated pt on instances when he should call EMS after a fall at home. Discussed modifying tasks at home, need to use RW at all times for safety with mobility, and continuation of PT at outpatient setting. Pt returned to room & left in recliner with all needs within reach.   Pt c/o pressure/pain in feet around toes when wearing shoes, educated pt to remove shoes when resting to relieve pressure on feet.   Therapy Documentation Precautions:  Precautions Precautions: Fall Restrictions Weight  Bearing Restrictions: No  Pain: Pain Assessment Pain Assessment: 0-10 Pain Score: 7  Pain Type:  (neuropathic pain) Pain Location:  (B hands & feet) Pain Intervention(s): RN made aware   See Function Navigator for Current Functional Status.   Therapy/Group: Individual Therapy  Waunita Schooner 10/03/2015, 1:14 PM

## 2015-10-03 NOTE — Progress Notes (Signed)
Recreational Therapy Discharge Summary Patient Details  Name: John Hoover MRN: [id number] Date of Birth: Oct 28, 1965 Today's Date: 10/03/2015  Long term goals set: 1  Long term goals met: 1  Comments on progress toward goals: Pt participated in community reintegration/outing to [facility] at overall supervision level using RW.  Goals focused on safe community mobility, identification & negotiation of obstacles, energy conservation, accessing public restroom.  See outing goal sheet for full details.  Pt has made great progress toward goal and is ready for discharge home with family.  Pt is discharging at supervision level for community reintegration.  Education provided on energy conservation and activity modification/adaptations.  Reasons for discharge: discharge from hospital    Patient/family agrees with progress made and goals achieved: Yes  [name] 10/03/2015, 11:59 AM

## 2015-10-03 NOTE — Patient Care Conference (Signed)
Inpatient RehabilitationTeam Conference and Plan of Care Update Date: 10/03/2015   Time: 2:23 PM    Patient Name: John Hoover      Medical Record Number: [id number]  Date of Birth: 1965-11-10 Sex: Male         Room/Bed: 4W13C/4W13C-01 Payor Info: Payor: BLUE CROSS BLUE SHIELD / Plan: BCBS OTHER / Product Type: *No Product type* /    Admitting Diagnosis: debility  Admit Date/Time:  09/24/2015  3:38 PM Admission Comments: No comment available   Primary Diagnosis:  Critical illness polyneuropathy (HCC) Principal Problem: Critical illness polyneuropathy John Hoover(HCC)  Patient Active Problem List   Diagnosis Date Noted  . Critical illness polyneuropathy (HCC) 09/30/2015  . Debility 09/24/2015  . Weakness   . Abnormality of gait   . Pain   . Neuropathic pain   . Anxiety state   . Yeast infection   . Anemia of chronic disease   . Hypokalemia   . Peripheral edema   . ATN (acute tubular necrosis) (HCC)   . Scrotal edema   . Thrombocytopenia (HCC)   . MODS (multiple organ dysfunction syndrome)   . Endotracheally intubated   . Hypovolemic shock (HCC)   . Acute respiratory failure (HCC)   . AKI (acute kidney injury) (HCC)   . Lactic acidosis   . Hypoxia 08/27/2015  . Rhabdomyolysis 08/27/2015  . Hyperkalemia 08/27/2015  . ARF (acute renal failure) (HCC) 08/27/2015  . Routine general medical examination at a health care facility 08/23/2014  . Advance care planning 08/23/2014  . Meniere disease 04/08/2011  . ALLERGIC RHINITIS, SEASONAL 12/02/2007    Expected Discharge Date: Expected Discharge Date: 10/05/15  Team Members Present: Physician leading conference: Dr. 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 LBM: 10/01/2015  remain cont x2   continue with plan of care    Swallow/Nutrition/ Hydration             ADL's   Supervision for BADL and transfers, min assist for lower body dressing (foot care)  Mod I with LRAD  Endurance, funcitonal mobility, anticipatory awareness, homemaking   Mobility   supervision for gait with RW >150 ft, supervision for stair negotiation with rails or RW but Mod A without any BUE support, steady A/close supervision for standing balance  supervision standing balance, mod I bed mobility, supervision transfers & gait with LRAD  standing balance, endurance trianing, gait training, stair training, heavy education regarding safety after d/c   Communication             Safety/Cognition/ Behavioral Observations            Pain   PRN oxy needed occasionally   <3   pre- medicate before theray, assess and treat qshift and PRN    Skin   nystatin cream to groin (pt only using once a day), fingers with scabs, and toes necrotic, BLE edema   no new skin issues while on reahb   keeps legs elevated, and apply nystatin to groin       *See Care Plan and progress notes for long and  short-term goals.  Barriers to Discharge: AKI, hypokalemia, LE edema, necrosis LE    Possible Resolutions to Barriers:  monitor labs, avoid nephrotoxic meds, wrap LE, decrease edema    Discharge Planning/Teaching Needs:  Home son to come in for education along with wife, pt doing well, wants to go back to work. Aware to talk with MD about this.      Team Discussion:  Reaching goals of mod/i level. MD addressed questions of returning to work and driving. Re-wrap legs daily for swelling-educating wife. Order for foot up brace. Will be ready for DC Friday.  Revisions to Treatment Plan:  DC Friday   Continued Need for Acute Rehabilitation Level  of Care: The patient requires daily medical management by a physician with specialized training in physical medicine and rehabilitation for the following conditions: Daily direction of a multidisciplinary physical rehabilitation program to ensure safe treatment while eliciting the highest outcome that is of practical value to the patient.: Yes Daily medical management of patient stability for increased activity during participation in an intensive rehabilitation regime.: Yes Daily analysis of laboratory values and/or radiology reports with any subsequent need for medication adjustment of medical intervention for : Cardiac problems;Wound care problems;Renal problems;Neurological problems  Lucy Chris 10/03/2015, 3:50 PM

## 2015-10-03 NOTE — Progress Notes (Signed)
[place] PHYSICAL MEDICINE & REHABILITATION     PROGRESS NOTE  Subjective/Complaints:  Pt laying in bed this AM.  His toes are slowly improving.  He states he can't wait to go home because he is tired of sleeping in the hospital bed.   ROS: Denies CP, SOB, nausea, vomiting, diarrhea  Objective: Vital Signs: Blood pressure 128/84, pulse 107, temperature 98.7 F (37.1 C), temperature source Oral, resp. rate 18, height 6\' 1"  (1.854 m), weight 91.3 kg (201 lb 4.5 oz), SpO2 98 %. No results found.  Recent Labs  10/01/15 1147  WBC 8.5  HGB 8.1*  HCT 27.4*  PLT 328    Recent Labs  10/01/15 1147 10/03/15 0525  NA 141 144  K 4.9 4.8  CL 110 109  GLUCOSE 94 93  BUN 25* 22*  CREATININE 1.63* 1.70*  CALCIUM 8.7* 8.6*   CBG (last 3)  No results for input(s): GLUCAP in the last 72 hours.  Wt Readings from Last 3 Encounters:  10/03/15 91.3 kg (201 lb 4.5 oz)  09/12/15 100.5 kg (221 lb 9 oz)  08/27/15 91.899 kg (202 lb 9.6 oz)    Physical Exam:  BP 128/84 mmHg  Pulse 107  Temp(Src) 98.7 F (37.1 C) (Oral)  Resp 18  Ht 6\' 1"  (1.854 m)  Wt 91.3 kg (201 lb 4.5 oz)  BMI 26.56 kg/m2  SpO2 98% Constitutional: He appears well-developed and well-nourished. No distress.  HENT: Normocephalic and atraumatic.  Eyes: Conjunctivae and EOM are normal.   Cardiovascular: + Tachycardia. Regular rhythm.  Respiratory: Effort normal. No respiratory distress. Clear to ausculation.  GI: Soft. Bowel sounds are normal. He exhibits no distension. There is no tenderness.  Musculoskeletal: He exhibits edema and tenderness in bilateral lower extremities.  1 + edema with bilateral lower shins and ankles (Improving).  Neurological: He is alert and oriented to person, place, and time.  Motor: 4+/5 throughout, except bilateral ankle dorsi/plantar flexion 4/5  Skin: Skin is warm and dry. No rash noted.  Ischemic changes to mainly to toes; fingertips almost resolved Psychiatric: His behavior is  normal. Thought content normal. Cognition and memory are normal.  Assessment/Plan: 1. Functional deficits secondary to debility which require 3+ hours per day of interdisciplinary therapy in a comprehensive inpatient rehab setting. Physiatrist is providing close team supervision and 24 hour management of active medical problems listed below. Physiatrist and rehab team continue to assess barriers to discharge/monitor patient progress toward functional and medical goals.  Function:  Bathing Bathing position   Position: Shower  Bathing parts Body parts bathed by patient: Right arm, Left arm, Chest, Abdomen, Front perineal area, Buttocks, Right upper leg, Left upper leg, Right lower leg, Left lower leg Body parts bathed by helper: Back  Bathing assist Assist Level: Supervision or verbal cues      Upper Body Dressing/Undressing Upper body dressing   What is the patient wearing?: Pull over shirt/dress     Pull over shirt/dress - Perfomed by patient: Thread/unthread right sleeve, Thread/unthread left sleeve, Put head through opening, Pull shirt over trunk          Upper body assist Assist Level: More than reasonable time, Supervision or verbal cues      Lower Body Dressing/Undressing Lower body dressing   What is the patient wearing?: Underwear, Pants, Non-skid slipper socks Underwear - Performed by patient: Thread/unthread right underwear leg, Thread/unthread left underwear leg, Pull underwear up/down Underwear - Performed by helper: Pull underwear up/down Pants- Performed by patient: Thread/unthread  right pants leg, Thread/unthread left pants leg, Pull pants up/down, Fasten/unfasten pants Pants- Performed by helper: Pull pants up/down Non-skid slipper socks- Performed by patient: Don/doff left sock, Don/doff right sock Non-skid slipper socks- Performed by helper: Don/doff right sock, Don/doff left sock     Shoes - Performed by patient: Don/doff right shoe, Don/doff left shoe             Lower body assist Assist for lower body dressing: Supervision or verbal cues      Toileting Toileting Toileting activity did not occur: Safety/medical concerns Toileting steps completed by patient: Adjust clothing prior to toileting, Performs perineal hygiene, Adjust clothing after toileting      Toileting assist Assist level: Supervision or verbal cues   Transfers Chair/bed transfer   Chair/bed transfer method: Stand pivot Chair/bed transfer assist level: Touching or steadying assistance (Pt > 75%) Chair/bed transfer assistive device: Armrests, Patent attorney     Max distance: 150 ft Assist level: Supervision or verbal cues   Wheelchair   Type: Manual Max wheelchair distance: 150 Assist Level: Touching or steadying assistance (Pt > 75%)  Cognition Comprehension Comprehension assist level: Understands complex 90% of the time/cues 10% of the time  Expression Expression assist level: Expresses complex ideas: With extra time/assistive device  Social Interaction Social Interaction assist level: Interacts appropriately with others with medication or extra time (anti-anxiety, antidepressant).  Problem Solving Problem solving assist level: Solves complex 90% of the time/cues < 10% of the time  Memory Memory assist level: Requires cues to use assistive device    Medical Problem List and Plan: 1. 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 DVT Prophylaxis/Anticoagulation: Pharmaceutical: Lovenox 3. Pain Management:   Continue oxycodone prn.    Neurontin increased to 300 TID on 4/27  Educated on spreading out narcotics.  4. Mood: Team to provide ego support.   Cont low dose xanax for prn use.  5. Neuropsych: This patient is capable of making decisions on his own behalf. 6. Skin/Wound Care: Educated on pressure relief measures.   Cont nystatin cream to peri area and dose of diflucan 7. Fluids/Electrolytes/Nutrition:  Monitor I/O.   Wife advised to bring food from home.  8. Anemia of critical illness:   Hemoglobin 8.1 on 5/1  Continue iron supplement  Cont to monitor 9. Hypokalemia: Resolved  Continue Magnesium supplement.   Low dose supplement added, increased to 20 on 4/28, DC'd on 5/2. 10. Peripheral edema: Diurese prn. Patient and wife educated on monitoring salt intake. Protein supplement added due to low protein stores.  11. Acute renal failure due to ATN: avoid nephrotoxic medications. Continue to monitor.   Creatinine 1.70 on 5/3 12. Yeast infection: Treated  LOS (Days) 9 A FACE TO FACE EVALUATION WAS PERFORMED  Ankit Karis Juba 10/03/2015 8:50 AM

## 2015-10-04 ENCOUNTER — Inpatient Hospital Stay (HOSPITAL_COMMUNITY): Payer: BLUE CROSS/BLUE SHIELD | Admitting: Physical Therapy

## 2015-10-04 ENCOUNTER — Inpatient Hospital Stay (HOSPITAL_COMMUNITY): Payer: BLUE CROSS/BLUE SHIELD

## 2015-10-04 NOTE — Progress Notes (Signed)
Physical Therapy Note  Patient Details  Name: John Hoover MRN: [id number] Date of Birth: 1966-05-23 Today's Date: 10/04/2015  [id number], 30 min individual  Gait to/from room with RW.  Patient demonstrates increased fall risk as noted by score of  39 /56 on Berg Balance Scale.  (<36= high risk for falls, close to 100%; 37-45 significant >80%; 46-51 moderate >50%; 52-55 lower >25%) Berg discussed with pt.  With external pertubations at hips, pt demonstrated 0 ankle or hip strategies initially, only stepping strategies bil.  With practice, bil hip strategy emerged.  Pt educated on typical balance strategies and balance systems.  Pt left resting in recliner with all needs within reach.   Eulalie Speights 10/04/2015, 7:49 AM

## 2015-10-04 NOTE — Progress Notes (Signed)
Occupational Therapy Session Note  Patient Details  Name: John Hoover MRN: [id number] Date of Birth: [birth year]  Today's Date: 10/04/2015 OT Individual Time: 1115-1200 OT Individual Time Calculation (min): 45 min    Short Term Goals: Week 2:  OT Short Term Goal 1 (Week 2): STG=LTG d/t short remaining LOS  Skilled Therapeutic Interventions/Progress Updates: ADL-retraining with focus on discharge planning, safety awareness, and effective use of AE/DME.   Pt ambulates to bathroom using RW and completes transfer and bathing with distant supervision.   Pt returns to w/c using RW and dresses seated with setup only to apply ace wrap and vc to problem-solve applying AFO on left ankle/foot.   Pt educated on use of shoe horn to don shoes with min assist required to don left shoe, supervision for right.   Pt then grooms unassisted and recovers to recliner to allow therapist to place ace wrap to reduce edema at BLE.        Therapy Documentation Precautions:  Precautions Precautions: Fall Restrictions Weight Bearing Restrictions: No  Pain: Pain Assessment Pain Assessment: No/denies pain Pain Score: 5   ADL: ADL ADL Comments: see Functional Assessment Tool  See Function Navigator for Current Functional Status.   Therapy/Group: Individual Therapy  John Hoover 10/04/2015, 12:21 PM

## 2015-10-04 NOTE — Progress Notes (Signed)
Physical Therapy Discharge Summary  Patient Details  Name: BARNETT ELZEY MRN: [id number] Date of Birth: 09-23-1965  Today's Date: 10/04/2015 PT Individual Time: 0800-0900 AND 1530-1605  PT Individual Time Calculation (min): 60 min AND 35 minutes.    Patient has met 10 of 10 long term goals due to improved activity tolerance, improved balance, improved postural control, increased strength, increased range of motion and improved awareness.  Patient to discharge at an ambulatory level Supervision.   Patient's care partner is independent to provide the necessary physical and cognitive assistance at discharge.    Recommendation:  Patient will benefit from ongoing skilled PT services in outpatient setting to continue to advance safe functional mobility, address ongoing impairments in Strength, balance, gait, endurance, and minimize fall risk.  Equipment: RW. Foot up braces.   Reasons for discharge: treatment goals met and discharge from hospital  Patient/family agrees with progress made and goals achieved: Yes  PT Discharge   Treatment:  Session 1:  Patient performed grad day assessment. See below for results. Patient also performed car transfer x 2 from various heights with Supervision A from PT with min cues for AD management and proper use of UE.   Gait training performed with RW on level surface for 262f with supervision A and uneven surface for 128fwith supervision A. PT provided cues for improved foot clearance and proper use of UE of RW to increased stability and safety with gait.   Patient returned to room and left sitting in recliner with call bell within reach.   Session 2.   Patient received coming out of bathroom with family present. PT instructed patient in gait training with RW on level surface with supervision A for [id number], and [id number]'s family educated in safe guarding technique and wife was able to demonstrate proper positioning falling instruction from  PT. PT educated patient and spouse on proper application of foot up brace and when to where brace. Gait training performed on uneven surface with RW for 200ft 53f cues for AD management and improved step width and length for improved safety, patient able to maintain improved gait pattern approx75% of the time following cues from PT. Patient instructed in stair training for 15 step and 6 steps with one UE supported and supervision A from PT,  As well as moderate verbal instruction for proper step-to technique due to increased weakness in the LLE. Patient's wife also instructed in safe guarding technique and was able to properly guard patient for last 6 steps.   Patient returned to room and left sitting in recliner with all needs met.    Precautions/Restrictions Restrictions Weight Bearing Restrictions: No Vital Signs   Pain Pain Assessment Pain Assessment: No/denies pain Pain Score: 5  Vision/Perception     Cognition Orientation Level: Oriented X4 Sensation   decreased in BLE below ankles.  Motor  Motor Motor: Within Functional Limits Motor - Discharge Observations: generalized weakness in BLE, most weakness in BLE ankles. 3 beat clonus bilaterally  Mobility Bed Mobility Bed Mobility: Rolling Right;Rolling Left;Supine to Sit;Sit to Supine Rolling Right: 6: Modified independent (Device/Increase time) Rolling Left: 6: Modified independent (Device/Increase time) (with bed rails) Supine to Sit: 6: Modified independent (Device/Increase time) ( extra time) Sit to Supine: 6: Modified independent (Device/Increase time) (extra time) Transfers Transfers: Yes Sit to Stand: 5: Supervision Sit to Stand Details: Verbal cues for precautions/safety Stand to Sit: 5: Supervision Stand to Sit Details (indicate cue type and reason): Verbal cues for precautions/safety Stand  Pivot Transfers: 5: Supervision Stand Pivot Transfer Details: Verbal cues for sequencing;Verbal cues for technique;Verbal cues  for precautions/safety Squat Pivot Transfers: 5: Supervision Squat Pivot Transfer Details: Verbal cues for sequencing;Verbal cues for technique;Verbal cues for precautions/safety Locomotion  Ambulation Ambulation: Yes Ambulation/Gait Assistance: 5: Supervision Ambulation Distance (Feet): 225 Feet Assistive device: Rolling walker Ambulation/Gait Assistance Details: Verbal cues for gait pattern;Verbal cues for safe use of DME/AE Gait Gait: Yes Gait Pattern: Impaired Gait Pattern: Step-through pattern;Decreased step length - left;Decreased step length - right;Right foot flat;Left foot flat;Narrow base of support;Poor foot clearance - right;Poor foot clearance - left Stairs / Additional Locomotion Stairs: Yes Stairs Assistance: 5: Supervision;6: Modified independent (Device/Increase time) Stairs Assistance Details: Verbal cues for precautions/safety Stair Management Technique: One rail Left Number of Stairs: 12 Height of Stairs: 6 Ramp: 6: Modified independent (Device) Curb: 6: Modified independent (Device/increase time) Architect: Yes Wheelchair Assistance: 5: Careers information officer: Both upper extremities Wheelchair Parts Management: Independent Distance: 200  Trunk/Postural Assessment  Cervical Assessment Cervical Assessment: Within Water engineer Thoracic Assessment: Within Functional Limits Lumbar Assessment Lumbar Assessment: Within Functional Limits Postural Control Postural Control: Within Functional Limits  Balance Balance Balance Assessed: Yes Standardized Balance Assessment Standardized Balance Assessment: Berg Balance Test Berg Balance Test Sit to Stand: Able to stand  independently using hands Standing Unsupported: Able to stand 2 minutes with supervision Sitting with Back Unsupported but Feet Supported on Floor or Stool: Able to sit safely and securely 2 minutes Stand to Sit: Sits safely with  minimal use of hands Transfers: Able to transfer safely, definite need of hands Standing Unsupported with Eyes Closed: Able to stand 10 seconds with supervision Standing Ubsupported with Feet Together: Able to place feet together independently and stand for 1 minute with supervision From Standing, Reach Forward with Outstretched Arm: Can reach forward >12 cm safely (5") From Standing Position, Pick up Object from Floor: Able to pick up shoe, needs supervision From Standing Position, Turn to Look Behind Over each Shoulder: Looks behind from both sides and weight shifts well Turn 360 Degrees: Able to turn 360 degrees safely but slowly Standing Unsupported, Alternately Place Feet on Step/Stool: Able to complete >2 steps/needs minimal assist Standing Unsupported, One Foot in Front: Able to take small step independently and hold 30 seconds Standing on One Leg: Tries to lift leg/unable to hold 3 seconds but remains standing independently Total Score: 39 Dynamic Sitting Balance Dynamic Sitting - Balance Support: Feet unsupported Dynamic Sitting - Level of Assistance: 6: Modified independent (Device/Increase time) Static Standing Balance Static Standing - Level of Assistance: 7: Independent Dynamic Standing Balance Dynamic Standing - Balance Support: No upper extremity supported Dynamic Standing - Level of Assistance: 5: Stand by assistance Dynamic Standing - Balance Activities: Lateral lean/weight shifting;Forward lean/weight shifting;Reaching for objects   Patient demonstrates increased fall risk as noted by score of  39/56 on Berg Balance Scale.  (<36= high risk for falls, close to 100%; 37-45 significant >80%; 46-51 moderate >50%; 52-55 lower >25%)  Extremity Assessment      RLE Assessment RLE Assessment: Exceptions to [facility] RLE AROM (degrees) Overall AROM Right Lower Extremity: Within functional limits for tasks assessed RLE Strength Right Hip Flexion: 4/5 Right Hip ABduction: 5/5 Right  Hip ADduction: 5/5 Right Knee Flexion: 5/5 Right Knee Extension: 5/5 Right Ankle Dorsiflexion: 2+/5 Right Ankle Plantar Flexion: 4-/5 LLE Assessment LLE Assessment: Exceptions to [facility] LLE Strength Left Hip Flexion: 4-/5 Left Hip ABduction: 5/5 Left Hip ADduction: 5/5 Left  Knee Flexion: 5/5 Left Knee Extension: 5/5 Left Ankle Dorsiflexion: 3-/5 Left Ankle Plantar Flexion: 4-/5   See Function Navigator for Current Functional Status.  Lorie Phenix 10/04/2015, 12:35 PM

## 2015-10-04 NOTE — Progress Notes (Signed)
Social Work Patient ID: John Hoover, male   DOB: 1965/12/18, 50 y.o.   MRN: [id number] Met with pt and wife yesterday who feel [facility] is closer than [facility] for OP therapy. Will make referral to [facility] for OPPT. Both agreeable to team conference and recommendations.

## 2015-10-04 NOTE — Progress Notes (Signed)
Physical Therapy Session Note  Patient Details  Name: John Hoover MRN: [id number] Date of Birth: [birth year]  Today's Date: 10/04/2015 PT Individual Time: 1330-1400 PT Individual Time Calculation (min): 30 min   Short Term Goals: Week 1:  PT Short Term Goal 1 (Week 1): Pt will be able to complete stand pivot/squat pivot transfers with Min A. PT Short Term Goal 2 (Week 1): Pt will be able to transfer sit<>stand with CGA. PT Short Term Goal 3 (Week 1): Pt will ambulate 50 ft with LRAD & min A.  Skilled Therapeutic Interventions/Progress Updates:    Pt received resting in w/c with no c/o pain and agreeable to therapy session.  Pt amb throughout unit mod I with RW demonstrating equal step length and appropriate walker positioning.  Pt's family member present for education.  PT instructed family member in car transfer which pt performed with distant supervision.  PT instructed pt and family member in negotiation of 2 short steps onto porch using RW forward ascent/descent and pt returned demonstration on 8 steps with supervision.  Pt performed OTAGO handout HEP with min verbal cues x10 reps on BLE.  Pt amb back to room at end of session mod I and positioned in recliner with call bell in reach and needs met.   Therapy Documentation Precautions:  Precautions Precautions: Fall Restrictions Weight Bearing Restrictions: No   See Function Navigator for Current Functional Status.   Therapy/Group: Individual Therapy  Earnest Conroy Penven-Crew 10/04/2015, 3:17 PM

## 2015-10-04 NOTE — Progress Notes (Signed)
[place] PHYSICAL MEDICINE & REHABILITATION     PROGRESS NOTE  Subjective/Complaints:  Patient seen lying in bed this morning. He is looking forward to going home soon.  ROS: Denies CP, SOB, nausea, vomiting, diarrhea  Objective: Vital Signs: Blood pressure 127/80, pulse 100, temperature 98.9 F (37.2 C), temperature source Oral, resp. rate 18, height 6\' 1"  (1.854 m), weight 92.8 kg (204 lb 9.4 oz), SpO2 97 %. No results found.  Recent Labs  10/01/15 1147  WBC 8.5  HGB 8.1*  HCT 27.4*  PLT 328    Recent Labs  10/01/15 1147 10/03/15 0525  NA 141 144  K 4.9 4.8  CL 110 109  GLUCOSE 94 93  BUN 25* 22*  CREATININE 1.63* 1.70*  CALCIUM 8.7* 8.6*   CBG (last 3)  No results for input(s): GLUCAP in the last 72 hours.  Wt Readings from Last 3 Encounters:  10/04/15 92.8 kg (204 lb 9.4 oz)  09/12/15 100.5 kg (221 lb 9 oz)  08/27/15 91.899 kg (202 lb 9.6 oz)    Physical Exam:  BP 127/80 mmHg  Pulse 100  Temp(Src) 98.9 F (37.2 C) (Oral)  Resp 18  Ht 6\' 1"  (1.854 m)  Wt 92.8 kg (204 lb 9.4 oz)  BMI 27.00 kg/m2  SpO2 97% Constitutional: He appears well-developed and well-nourished. No distress.  HENT: Normocephalic and atraumatic.  Eyes: Conjunctivae and EOM are normal.   Cardiovascular: + Tachycardia. Regular rhythm.  Respiratory: Effort normal. No respiratory distress. Clear to ausculation.  GI: Soft. Bowel sounds are normal. He exhibits no distension. There is no tenderness.  Musculoskeletal: He exhibits edema and tenderness in bilateral lower extremities.  1 + edema with bilateral lower shins and ankles (Improving, Mainly dependent progressing throughout the day).  Neurological: He is alert and oriented to person, place, and time.  Motor: 4+/5 throughout, except bilateral ankle dorsi/plantar flexion 4/5  Skin: Skin is warm and dry. No rash noted.  Ischemic changes to mainly to toes; fingertips almost resolved Psychiatric: His behavior is normal. Thought  content normal. Cognition and memory are normal.  Assessment/Plan: 1. Functional deficits secondary to debility which require 3+ hours per day of interdisciplinary therapy in a comprehensive inpatient rehab setting. Physiatrist is providing close team supervision and 24 hour management of active medical problems listed below. Physiatrist and rehab team continue to assess barriers to discharge/monitor patient progress toward functional and medical goals.  Function:  Bathing Bathing position   Position: Shower  Bathing parts Body parts bathed by patient: Right arm, Left arm, Chest, Abdomen, Front perineal area, Buttocks, Right upper leg, Left upper leg, Right lower leg, Left lower leg Body parts bathed by helper: Back  Bathing assist Assist Level: Supervision or verbal cues      Upper Body Dressing/Undressing Upper body dressing   What is the patient wearing?: Pull over shirt/dress     Pull over shirt/dress - Perfomed by patient: Thread/unthread right sleeve, Thread/unthread left sleeve, Put head through opening, Pull shirt over trunk          Upper body assist Assist Level: More than reasonable time, Supervision or verbal cues      Lower Body Dressing/Undressing Lower body dressing   What is the patient wearing?: Underwear, Pants, Non-skid slipper socks Underwear - Performed by patient: Thread/unthread right underwear leg, Thread/unthread left underwear leg, Pull underwear up/down Underwear - Performed by helper: Pull underwear up/down Pants- Performed by patient: Thread/unthread right pants leg, Thread/unthread left pants leg, Pull pants up/down, Fasten/unfasten  pants Pants- Performed by helper: Pull pants up/down Non-skid slipper socks- Performed by patient: Don/doff left sock, Don/doff right sock Non-skid slipper socks- Performed by helper: Don/doff right sock, Don/doff left sock     Shoes - Performed by patient: Don/doff right shoe, Don/doff left shoe             Lower body assist Assist for lower body dressing: Supervision or verbal cues      Toileting Toileting Toileting activity did not occur: Safety/medical concerns Toileting steps completed by patient: Adjust clothing prior to toileting, Performs perineal hygiene, Adjust clothing after toileting      Toileting assist Assist level: Supervision or verbal cues   Transfers Chair/bed transfer   Chair/bed transfer method: Stand pivot Chair/bed transfer assist level: Touching or steadying assistance (Pt > 75%) Chair/bed transfer assistive device: Armrests, Patent attorney     Max distance: >200 ft Assist level: Supervision or verbal cues   Wheelchair   Type: Manual Max wheelchair distance: 150 Assist Level: Touching or steadying assistance (Pt > 75%)  Cognition Comprehension Comprehension assist level: Understands complex 90% of the time/cues 10% of the time  Expression Expression assist level: Expresses complex ideas: With extra time/assistive device  Social Interaction Social Interaction assist level: Interacts appropriately with others with medication or extra time (anti-anxiety, antidepressant).  Problem Solving Problem solving assist level: Solves complex 90% of the time/cues < 10% of the time  Memory Memory assist level: Requires cues to use assistive device    Medical Problem List and Plan: 1. 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 DVT Prophylaxis/Anticoagulation: Pharmaceutical: Lovenox 3. Pain Management:   Continue oxycodone prn.    Neurontin increased to 300 TID on 4/27  Educated on spreading out narcotics.  4. Mood: Team to provide ego support.   Cont low dose xanax for prn use.  5. Neuropsych: This patient is capable of making decisions on his own behalf. 6. Skin/Wound Care: Educated on pressure relief measures.   Cont nystatin cream to peri area and dose of diflucan 7.  Fluids/Electrolytes/Nutrition: Monitor I/O.   Wife advised to bring food from home.  8. Anemia of critical illness:   Hemoglobin 8.1 on 5/1  Continue iron supplement  Cont to monitor 9. Hypokalemia: Resolved  Continue Magnesium supplement.   Low dose supplement added, increased to 20 on 4/28, DC'd on 5/2. 10. Peripheral edema: Diurese prn. Patient and wife educated on monitoring salt intake. Protein supplement added due to low protein stores.  11. Acute renal failure due to ATN: avoid nephrotoxic medications. Continue to monitor.   Creatinine 1.70 on 5/3 12. Yeast infection: Treated  LOS (Days) 10 A FACE TO FACE EVALUATION WAS PERFORMED  Ankit Karis Juba 10/04/2015 10:52 AM

## 2015-10-05 LAB — CBC WITH DIFFERENTIAL/PLATELET
BASOS ABS: 0.1 10*3/uL (ref 0.0–0.1)
Basophils Relative: 1 %
EOS ABS: 0.2 10*3/uL (ref 0.0–0.7)
EOS PCT: 3 %
HCT: 25.5 % — ABNORMAL LOW (ref 39.0–52.0)
HEMOGLOBIN: 7.7 g/dL — AB (ref 13.0–17.0)
LYMPHS ABS: 3 10*3/uL (ref 0.7–4.0)
LYMPHS PCT: 40 %
MCH: 29.2 pg (ref 26.0–34.0)
MCHC: 30.2 g/dL (ref 30.0–36.0)
MCV: 96.6 fL (ref 78.0–100.0)
Monocytes Absolute: 0.8 10*3/uL (ref 0.1–1.0)
Monocytes Relative: 10 %
NEUTROS PCT: 46 %
Neutro Abs: 3.5 10*3/uL (ref 1.7–7.7)
PLATELETS: 254 10*3/uL (ref 150–400)
RBC: 2.64 MIL/uL — AB (ref 4.22–5.81)
RDW: 15.6 % — ABNORMAL HIGH (ref 11.5–15.5)
WBC: 7.4 10*3/uL (ref 4.0–10.5)

## 2015-10-05 LAB — BASIC METABOLIC PANEL
Anion gap: 13 (ref 5–15)
BUN: 25 mg/dL — ABNORMAL HIGH (ref 6–20)
CO2: 26 mmol/L (ref 22–32)
Calcium: 8.8 mg/dL — ABNORMAL LOW (ref 8.9–10.3)
Chloride: 105 mmol/L (ref 101–111)
Creatinine, Ser: 1.63 mg/dL — ABNORMAL HIGH (ref 0.61–1.24)
GFR calc Af Amer: 54 mL/min — ABNORMAL LOW (ref >60)
GFR calc non Af Amer: 47 mL/min — ABNORMAL LOW (ref >60)
Glucose, Bld: 117 mg/dL — ABNORMAL HIGH (ref 65–99)
Potassium: 5 mmol/L (ref 3.5–5.1)
Sodium: 144 mmol/L (ref 135–145)

## 2015-10-05 MED ORDER — SENNOSIDES-DOCUSATE SODIUM 8.6-50 MG PO TABS: 2.0000 | ORAL_TABLET | Freq: Every day | ORAL | Status: DC

## 2015-10-05 MED ORDER — OXYCODONE HCL 5 MG PO TABS: ORAL_TABLET | ORAL | Status: DC

## 2015-10-05 MED ORDER — MAGNESIUM OXIDE 400 (241.3 MG) MG PO TABS: 200.0000 mg | ORAL_TABLET | Freq: Two times a day (BID) | ORAL | Status: DC

## 2015-10-05 MED ORDER — PANTOPRAZOLE SODIUM 40 MG PO TBEC: 40.0000 mg | DELAYED_RELEASE_TABLET | Freq: Every day | ORAL | Status: DC

## 2015-10-05 MED ORDER — METOPROLOL TARTRATE 25 MG PO TABS: 12.5000 mg | ORAL_TABLET | Freq: Two times a day (BID) | ORAL | Status: DC

## 2015-10-05 MED ORDER — GABAPENTIN 300 MG PO CAPS: 300.0000 mg | ORAL_CAPSULE | Freq: Three times a day (TID) | ORAL | Status: DC

## 2015-10-05 MED ORDER — FERROUS SULFATE 325 (65 FE) MG PO TABS: 325.0000 mg | ORAL_TABLET | Freq: Two times a day (BID) | ORAL | Status: DC

## 2015-10-05 MED ORDER — FOLIC ACID 1 MG PO TABS: 1.0000 mg | ORAL_TABLET | Freq: Every day | ORAL | Status: DC

## 2015-10-05 MED ORDER — NYSTATIN 100000 UNIT/GM EX CREA: TOPICAL_CREAM | Freq: Two times a day (BID) | CUTANEOUS | Status: DC

## 2015-10-05 NOTE — Discharge Instructions (Signed)
Inpatient Rehab Discharge Instructions  John Hoover Discharge date and time:  10/05/15  Activities/Precautions/ Functional Status: Activity: no lifting, driving, or strenuous exercise for till cleared by MD.  Diet: regular diet Wound Care: Keep areas on left foot dry. Do not soak/no swimming.   Functional status:  ___ No restrictions     ___ Walk up steps independently ___ 24/7 supervision/assistance   ___ Walk up steps with assistance ___ Intermittent supervision/assistance  ___ Bathe/dress independently _X__ Walk with walker    ___ Bathe/dress with assistance ___ Walk Independently    ___ Shower independently ___ Walk with assistance    ___ Shower with assistance _X__ No alcohol     _X__ Return to work to be decided on follow up.  ________  Special Instructions: 1. Wear foot up brace when up for fall prevention.  2. No alcohol. No tobacco products. 3. No driving till cleared by MD.    COMMUNITY REFERRALS UPON DISCHARGE:    Outpatient: PT  Agency:[facility] Phone:[phone]   Date of Last Service:10/05/2015  Appointment Date/Time:WILL CONTACT YOU/WIFE TO SCHEDULE APPOINTMENT FOR PT  Medical Equipment/Items Ordered:ROLLING WALKER & BEDSIDE COMMODE  Agency/Supplier:[facility]   [phone]    My questions have been answered and I understand these instructions. I will adhere to these goals and the provided educational materials after my discharge from the hospital.  Patient/Caregiver Signature _______________________________ Date __________  Clinician Signature _______________________________________ Date __________  Please bring this form and your medication list with you to all your follow-up doctor's appointments.

## 2015-10-05 NOTE — Progress Notes (Signed)
Social Work  Discharge Note  The overall goal for the admission was met for:   Discharge location: Yes-HOME WITH WIFE AND SON WHO CAN PROVIDE 24 HR SUPERVISION  Length of Stay: Yes-11 DAYS  Discharge activity level: Yes-SUPERVISION/MOD/I LEVEL  Home/community participation: Yes  Services provided included: MD, RD, PT, OT, SLP, RN, CM, TR, Pharmacy and SW  Financial Services: Private Insurance: [employer]  Follow-up services arranged: Outpatient: [facility] WILL CONTACT AT HOME TOI SCHEDULE APPOINTMENT, DME: [facility] and Patient/Family has no preference for HH/DME agencies  Comments (or additional information):PT DID WELL AND REACHED MOD/I LEVEL GOALS, WILL FOLLOW UP OPPT.   Patient/Family verbalized understanding of follow-up arrangements: Yes  Individual responsible for coordination of the follow-up plan: [name]-WIFE  Confirmed correct DME delivered: Elease Hashimoto 10/05/2015    Elease Hashimoto

## 2015-10-05 NOTE — Progress Notes (Addendum)
[place] PHYSICAL MEDICINE & REHABILITATION     PROGRESS NOTE  Subjective/Complaints:  Patient sitting up in bed this morning. He is looking very for to going home after being in the hospital for so long.  ROS: + Dysesthesias distal extremities. Denies CP, SOB, nausea, vomiting, diarrhea  Objective: Vital Signs: Blood pressure 116/80, pulse 100, temperature 99.2 F (37.3 C), temperature source Oral, resp. rate 18, height 6\' 1"  (1.854 m), weight 92.8 kg (204 lb 9.4 oz), SpO2 95 %. No results found.  Recent Labs  10/05/15 0806  WBC 7.4  HGB 7.7*  HCT 25.5*  PLT 254    Recent Labs  10/03/15 0525 10/05/15 0806  NA 144 144  K 4.8 5.0  CL 109 105  GLUCOSE 93 117*  BUN 22* 25*  CREATININE 1.70* 1.63*  CALCIUM 8.6* 8.8*   CBG (last 3)  No results for input(s): GLUCAP in the last 72 hours.  Wt Readings from Last 3 Encounters:  10/04/15 92.8 kg (204 lb 9.4 oz)  09/12/15 100.5 kg (221 lb 9 oz)  08/27/15 91.899 kg (202 lb 9.6 oz)    Physical Exam:  BP 116/80 mmHg  Pulse 100  Temp(Src) 99.2 F (37.3 C) (Oral)  Resp 18  Ht 6\' 1"  (1.854 m)  Wt 92.8 kg (204 lb 9.4 oz)  BMI 27.00 kg/m2  SpO2 95% Constitutional: He appears well-developed and well-nourished. No distress.  HENT: Normocephalic and atraumatic.  Eyes: Conjunctivae and EOM are normal.   Cardiovascular: Regular Rate and rhythm.  Respiratory: Effort normal. No respiratory distress. Clear to ausculation.  GI: Soft. Bowel sounds are normal. He exhibits no distension. There is no tenderness.  Musculoskeletal: He exhibits edema and tenderness in bilateral lower extremities.  1 + edema with bilateral lower shins and ankles (Improving, Mainly dependent progressing throughout the day).  Neurological: He is alert and oriented to person, place, and time.  Motor: 4+/5 throughout, except bilateral ankle dorsi/plantar flexion 4/5  Skin: Skin is warm and dry. No rash noted.  Ischemic changes to mainly to toes;  fingertips resolved Psychiatric: His behavior is normal. Thought content normal. Cognition and memory are normal.  Assessment/Plan: 1. Functional deficits secondary to debility which require 3+ hours per day of interdisciplinary therapy in a comprehensive inpatient rehab setting. Physiatrist is providing close team supervision and 24 hour management of active medical problems listed below. Physiatrist and rehab team continue to assess barriers to discharge/monitor patient progress toward functional and medical goals.  Function:  Bathing Bathing position   Position: Shower  Bathing parts Body parts bathed by patient: Right arm, Left arm, Chest, Abdomen, Front perineal area, Buttocks, Right upper leg, Left upper leg, Right lower leg, Left lower leg Body parts bathed by helper: Back  Bathing assist Assist Level: No help, No cues      Upper Body Dressing/Undressing Upper body dressing   What is the patient wearing?: Pull over shirt/dress     Pull over shirt/dress - Perfomed by patient: Thread/unthread right sleeve, Thread/unthread left sleeve, Put head through opening, Pull shirt over trunk          Upper body assist Assist Level: More than reasonable time, Supervision or verbal cues      Lower Body Dressing/Undressing Lower body dressing   What is the patient wearing?: Underwear, Pants, Socks, AFO Underwear - Performed by patient: Thread/unthread right underwear leg, Thread/unthread left underwear leg, Pull underwear up/down Underwear - Performed by helper: Pull underwear up/down Pants- Performed by patient: Thread/unthread right  pants leg, Thread/unthread left pants leg, Pull pants up/down, Fasten/unfasten pants Pants- Performed by helper: Pull pants up/down Non-skid slipper socks- Performed by patient: Don/doff left sock, Don/doff right sock Non-skid slipper socks- Performed by helper: Don/doff right sock, Don/doff left sock   Socks - Performed by helper: Don/doff right sock,  Don/doff left sock Shoes - Performed by patient: Don/doff right shoe, Fasten right, Don/doff left shoe, Fasten left   AFO - Performed by patient: Don/doff left AFO        Lower body assist Assist for lower body dressing: Supervision or verbal cues      Toileting Toileting Toileting activity did not occur: Safety/medical concerns Toileting steps completed by patient: Adjust clothing prior to toileting, Performs perineal hygiene, Adjust clothing after toileting      Toileting assist Assist level: More than reasonable time   Transfers Chair/bed transfer   Chair/bed transfer method: Ambulatory Chair/bed transfer assist level: Supervision or verbal cues Chair/bed transfer assistive device: Armrests, Patent attorney     Max distance: 233ft Assist level: Supervision or verbal cues   Wheelchair   Type: Manual Max wheelchair distance: 245ft Assist Level: No help, No cues, assistive device, takes more than reasonable amount of time  Cognition Comprehension Comprehension assist level: Understands basic 90% of the time/cues < 10% of the time  Expression Expression assist level: Expresses complex ideas: With extra time/assistive device  Social Interaction Social Interaction assist level: Interacts appropriately with others with medication or extra time (anti-anxiety, antidepressant).  Problem Solving Problem solving assist level: Solves complex 90% of the time/cues < 10% of the time  Memory Memory assist level: Requires cues to use assistive device    Medical Problem List and Plan: 1. 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 DVT Prophylaxis/Anticoagulation: Pharmaceutical: Lovenox 3. Pain Management:   Continue oxycodone prn.    Neurontin increased to 300 TID on 4/27  Educated on spreading out narcotics.  4. Mood: Team to provide ego support.   Cont low dose xanax for prn use.  5. Neuropsych: This patient is capable of making  decisions on his own behalf. 6. Skin/Wound Care: Educated on pressure relief measures.   Cont nystatin cream to peri area and dose of diflucan 7. Fluids/Electrolytes/Nutrition: Monitor I/O.   Wife advised to bring food from home.  8. Anemia of critical illness:   Hemoglobin 7.7 on 5/5  Continue iron supplement  Cont to monitor 9. Hypokalemia: Resolved  Continue Magnesium supplement.   Low dose supplement added, increased to 20 on 4/28, DC'd on 5/2. 10. Peripheral edema: Diurese prn. Patient and wife educated on monitoring salt intake. Protein supplement added due to low protein stores.  11. Acute renal failure due to ATN: avoid nephrotoxic medications. Continue to monitor.   Creatinine 1.63 on 5/5 12. Yeast infection: Treated  LOS (Days) 11 A FACE TO FACE EVALUATION WAS PERFORMED  Macil Crady Karis Juba 10/05/2015 9:45 AM

## 2015-10-05 NOTE — Discharge Summary (Signed)
Physician Discharge Summary  Patient ID: John Hoover MRN: [id number] DOB/AGE: 1966-04-18 50 y.o.  Admit date: 09/24/2015 Discharge date: 10/05/2015  Discharge Diagnoses:  Principal Problem:   Critical illness polyneuropathy (HCC) Active Problems:   Debility   Weakness   Abnormality of gait   Pain   Neuropathic pain   Anxiety state   Yeast infection   Anemia of chronic disease   Hypokalemia   Peripheral edema   ATN (acute tubular necrosis) (HCC)   Discharged Condition: Stable.    Labs:  Basic Metabolic Panel:  Recent Labs Lab 10/01/15 0501 10/01/15 1147 10/03/15 0525 10/05/15 0806  NA  --  141 144 144  K  --  4.9 4.8 5.0  CL  --  110 109 105  CO2  --  GLUCOSE  --  94 93 117*  BUN  --  25* 22* 25*  CREATININE 1.65* 1.63* 1.70* 1.63*  CALCIUM  --  8.7* 8.6* 8.8*    CBC:  Recent Labs Lab 10/01/15 1147 10/05/15 0806  WBC 8.5 7.4  NEUTROABS 4.2 3.5  HGB 8.1* 7.7*  HCT 27.4* 25.5*  MCV 97.2 96.6  PLT 328 254    CBG: No results for input(s): GLUCAP in the last 168 hours.  Brief HPI:   John Hoover is a 50 year old male originally admitted to [facility] on [facility]/15 with malaise and myalgias due to influenza with AKI and rhabdomyolysis. Hospital course complicated with MODS due to septic shock and respiratory failure requiring intubation. He has required CRRT due to renal failure with volume overload as well as pressors due to hypotension. He has also had issues with heme positive stools, thrombocytopenia, anemia requiring multiple units PRBC. Dr. Dewayne Shorter Onc consulted and work consistent with platelets decline due to sepsis and retic count low due to likely due to suppression. Has been on muliple antibiotics with respiratory cultures growing enterococcus faecalis and stenotrophomonas as well as stress dose steriods. He was transferred to [facility] on 04/13 for rehab. His renal status has continued to improve and lymphedema wraps to help with peripheral  edema. He was noted to be febrile on 04/20 and cefepime added due to concerns of PNA. He is showing improvement in activity and  CIR was recommended for follow up therapy.     Hospital Course: John Hoover was admitted to rehab 09/24/2015 for inpatient therapies to consist of PT and OT at least three hours five days a week. Past admission physiatrist, therapy team and rehab RN have worked together to provide customized collaborative inpatient rehab.  His blood pressures were monitored on bid basis and have been stable.  Tachycardia has improved with increase in activity tolerance and  been managed with lose dose BB. BLE edema is improving with compressive wraps and elevation as well as improvement in nutritional status. He continues to have anemia due to critical illness and iron was added for supplement.  Follow up CBC shows WBC and platelets to be stable. H/H is stable at 7.7/25.5.  He completed his antibiotic regime on 04/27 and has been afebrile during his stay.    Candidal overgrowth in inguinal area was treated with diflucan.  UOP has been good and renal function was monitored MWF till steady improvement noted. His creatinine has stabilized at 1.6-1.7 range. Po intake has improved and he is tolerating regular diet. He is continent of bowel and bladder.  He reported significant pain in BLE due to neuropathy. Neurontin was added and titrated  to 300 mg tid without side effects. He continue to require oxycodone on qid basis additionally and has been educated on gradual taper over the next 3-4 weeks.  He has made steady progress and is currently modified independent in supervised setting.  FMC of bilateral hands have improved with near normal sensation at finger tips. Tips of toes continue to have have black eschar and he was advised to keep this clean and dry--no baths, hot tubs or swimming. He will continue to receive follow up outpatient PT at [facility] after discharge.    Rehab course:  During patient's stay in rehab weekly team conferences were held to monitor patient's progress, set goals and discuss barriers to discharge. At admission, patient required moderate assistance with ADL tasks and moderate assistance with mobility. He  has had improvement in activity tolerance, balance, postural control, as well as ability to compensate for deficits. He is able to complete ADL tasks independently and requires supervision for tub/showe transfers.  He is independent for transfers and to ambulate 200' with RW.    Disposition: 01-Home or Self Care  Diet: Regular.   Special Instructions: 1. Wear foot up brace when up for fall prevention.  2. No alcohol. No tobacco products. 3. No driving till cleared by MD.  4. Avoid NSAIDS    Discharge Instructions    Ambulatory referral to Physical Medicine Rehab    Complete by:  As directed   Follow up in a week for critical illness polyneuropathy/moderate complexity.            Medication List    STOP taking these medications        azithromycin 1 g powder  Commonly known as:  ZITHROMAX     ibuprofen 200 MG tablet  Commonly known as:  ADVIL,MOTRIN      TAKE these medications        acetaminophen 325 MG tablet  Commonly known as:  TYLENOL  Take 650 mg by mouth every 6 (six) hours as needed.     ferrous sulfate 325 (65 FE) MG tablet  Take 1 tablet (325 mg total) by mouth 2 (two) times daily with a meal.     folic acid 1 MG tablet  Commonly known as:  FOLVITE  Take 1 tablet (1 mg total) by mouth daily.     gabapentin 300 MG capsule  Commonly known as:  NEURONTIN  Take 1 capsule (300 mg total) by mouth 3 (three) times daily.     magnesium oxide 400 (241.3 Mg) MG tablet  Commonly known as:  MAG-OX  Take 0.5 tablets (200 mg total) by mouth 2 (two) times daily.     metoprolol tartrate 25 MG tablet  Commonly known as:  LOPRESSOR  Take 0.5 tablets (12.5 mg total) by mouth 2 (two) times daily.     nystatin cream   Commonly known as:  MYCOSTATIN  Apply topically 2 (two) times daily.     oxyCODONE 5 MG immediate release tablet--Rx# 60 pills  Commonly known as:  Oxy IR/ROXICODONE  Take 1-2 pills every 8 hours as needed for a week.  Then taper to  One pill every 8 hours as needed for a week.  Continue to taper every week as tolerated to wean off over next month.     pantoprazole 40 MG tablet  Commonly known as:  PROTONIX  Take 1 tablet (40 mg total) by mouth daily.     senna-docusate 8.6-50 MG tablet  Commonly known as:  Senokot-S  Take 2 tablets by mouth at bedtime.       Follow-up Information    Follow up with Ankit Karis JubaAnil Patel, MD.   Specialty:  Physical Medicine and Rehabilitation   Why:  office will call you for follow up appointment   Contact information:   [address] [place] [id number] [phone]       Follow up with Crawford GivensGraham Duncan, MD On 10/09/2015.   Specialty:  Family Medicine   Why:  APPT @ 11:30 AM   Contact information:   [address] [place] [address] [phone]       Follow up with Mosetta PigeonSINGH,HARMEET, MD. Call today.   Specialty:  Internal Medicine   Why:  for follow up appointment   Contact information:   [address] [place] [address] [phone]       Signed: Jacquelynn CreeLove, Americus Perkey S 10/05/2015, 1:41 PM

## 2015-10-05 NOTE — Progress Notes (Signed)
Patient and spouse received discharge instructions from Pam Love, PA-C with verbal understanding. Patient discharged to home with spouse and belongings. 

## 2015-10-05 NOTE — Progress Notes (Signed)
Occupational Therapy Discharge Summary  Patient Details  Name: John Hoover MRN: [id number] Date of Birth: 11-06-1965   Patient has met 10 of 12 long term goals due to improved activity tolerance, improved balance, ability to compensate for deficits and improved awareness.  Patient to discharge at overall Modified Independent level with supervision recommended for tub/shower transfers.  Patient's care partner is independent to provide the necessary physical assistance at discharge.   Pt demonstrated consistent progress during admission with performance of [place] in bilateral hands improving d/t recovery of near normal sensation at fingertips.  Reasons goals not met: Pt denied need for instruction on homemaking tasks although stating during initial eval that his daily routine included doing his laundry and completing light meal prep.  Recommendation:  Patient will not require ongoing skilled OT services to continue to advance functional skills in the area of BADL and iADL.  Equipment: BSC  Reasons for discharge: treatment goals met  Patient/family agrees with progress made and goals achieved: Yes  OT Discharge Precautions/Restrictions  Precautions Precautions: Fall Restrictions Weight Bearing Restrictions: No  Vital Signs Therapy Vitals Temp: 98.1 F (36.7 C) Temp Source: Oral Pulse Rate: 96 Resp: 18 BP: 131/85 mmHg Patient Position (if appropriate): Sitting Oxygen Therapy SpO2: 100 % O2 Device: Not Delivered  Pain Pain Assessment Pain Assessment: No/denies pain Pain Score: 6  Pain Type: Acute pain Pain Location: Toe (Comment which one) Pain Orientation: Left;Right Pain Descriptors / Indicators: Aching Pain Frequency: Occasional Pain Intervention(s): Medication (See eMAR)  ADL ADL ADL Comments: see Functional Assessment Tool  Vision/Perception  Vision- History Baseline Vision/History: No visual deficits Patient Visual Report: No change from baseline    Cognition Overall Cognitive Status: Within Functional Limits for tasks assessed Arousal/Alertness: Awake/alert Orientation Level: Oriented X4 Attention: Alternating Alternating Attention: Appears intact Memory: Appears intact Awareness: Appears intact Problem Solving: Appears intact Safety/Judgment: Appears intact  Sensation Sensation Light Touch: Impaired Detail Light Touch Impaired Details: Impaired RLE;Impaired LLE Stereognosis: Appears Intact Hot/Cold: Appears Intact Proprioception: Appears Intact Coordination Gross Motor Movements are Fluid and Coordinated: Yes Fine Motor Movements are Fluid and Coordinated: Yes  Motor  Motor Motor: Within Functional Limits Motor - Discharge Observations: generalized weakness in BLE, most weakness in BLE ankles. 3 beat clonus bilaterally  Mobility  Bed Mobility Bed Mobility: Rolling Right;Rolling Left;Supine to Sit;Sit to Supine Rolling Right: 6: Modified independent (Device/Increase time) Rolling Left: 6: Modified independent (Device/Increase time) Supine to Sit: 6: Modified independent (Device/Increase time) Sit to Supine: 6: Modified independent (Device/Increase time) Transfers Transfers: Sit to Stand;Stand to Sit Sit to Stand: 5: Supervision Sit to Stand Details: Verbal cues for precautions/safety Stand to Sit: 5: Supervision Stand to Sit Details (indicate cue type and reason): Verbal cues for precautions/safety   Trunk/Postural Assessment  Cervical Assessment Cervical Assessment: Within Functional Limits Thoracic Assessment Thoracic Assessment: Within Functional Limits Lumbar Assessment Lumbar Assessment: Within Functional Limits Postural Control Postural Control: Within Functional Limits   Balance Balance Balance Assessed: Yes Standardized Balance Assessment Standardized Balance Assessment: Berg Balance Test Berg Balance Test Sit to Stand: Able to stand  independently using hands Standing Unsupported: Able to  stand 2 minutes with supervision Sitting with Back Unsupported but Feet Supported on Floor or Stool: Able to sit safely and securely 2 minutes Stand to Sit: Sits safely with minimal use of hands Transfers: Able to transfer safely, definite need of hands Standing Unsupported with Eyes Closed: Able to stand 10 seconds with supervision Standing Ubsupported with Feet Together: Able to place feet  together independently and stand for 1 minute with supervision From Standing, Reach Forward with Outstretched Arm: Can reach forward >12 cm safely (5") From Standing Position, Pick up Object from Floor: Able to pick up shoe, needs supervision From Standing Position, Turn to Look Behind Over each Shoulder: Looks behind from both sides and weight shifts well Turn 360 Degrees: Able to turn 360 degrees safely but slowly Standing Unsupported, Alternately Place Feet on Step/Stool: Able to complete >2 steps/needs minimal assist Standing Unsupported, One Foot in Front: Able to take small step independently and hold 30 seconds Standing on One Leg: Tries to lift leg/unable to hold 3 seconds but remains standing independently Total Score: 39 Static Sitting Balance Static Sitting - Level of Assistance: 6: Modified independent (Device/Increase time) Dynamic Sitting Balance Dynamic Sitting - Balance Support: Feet unsupported Static Standing Balance Static Standing - Balance Support: During functional activity;No upper extremity supported Static Standing - Level of Assistance: 7: Independent Dynamic Standing Balance Dynamic Standing - Balance Support: No upper extremity supported Dynamic Standing - Level of Assistance: 5: Stand by assistance Dynamic Standing - Balance Activities: Lateral lean/weight shifting;Forward lean/weight shifting;Reaching for objects  Extremity/Trunk Assessment RUE Assessment RUE Assessment: Within Functional Limits LUE Assessment LUE Assessment: Within Functional Limits   See Function  Navigator for Current Functional Status.  John Hoover,John Hoover 10/05/2015, 12:39 PM  

## 2015-10-08 ENCOUNTER — Telehealth: Payer: Self-pay

## 2015-10-08 ENCOUNTER — Ambulatory Visit: Payer: BLUE CROSS/BLUE SHIELD | Admitting: Physical Therapy

## 2015-10-08 ENCOUNTER — Ambulatory Visit: Payer: BLUE CROSS/BLUE SHIELD | Admitting: Occupational Therapy

## 2015-10-08 NOTE — Telephone Encounter (Signed)
1. Are you/is patient experiencing any problems since coming home? Are there any questions regarding any aspect of care? No  2. Are there any questions regarding medications administration/dosing? Are meds being taken as prescribed? Patient should review meds with caller to confirm. Meds have been confirmed.  3. Have there been any falls? No 4. Has Home Health been to the house and/or have they contacted you? If not, have you tried to contact them? Can we help you contact them? Outpatient rehab has not been in contact.  5. Are bowels and bladder emptying properly? Are there any unexpected incontinence issues? If applicable, is patient following bowel/bladder programs? No issues.  6. Any fevers, problems with breathing, unexpected pain? No issues.  7. Are there any skin problems or new areas of breakdown? No issues.  8. Has the patient/family member arranged specialty MD follow up (ie cardiology/neurology/renal/surgical/etc)?  Can we help arrange? Follow up appointments have been made.  9. Does the patient need any other services or support that we can help arrange? No 10. Are caregivers following through as expected in assisting the patient? Yes, wife.        11.Has the patient quit smoking, drinking alcohol, or using drugs as recommended?  Pt is not smoking or using drugs. Pt did drink half a beer                  Saturday.   Pt has an appt on 10/18/15 @ 1:00 pm.

## 2015-10-09 ENCOUNTER — Ambulatory Visit (INDEPENDENT_AMBULATORY_CARE_PROVIDER_SITE_OTHER): Payer: BLUE CROSS/BLUE SHIELD | Admitting: Family Medicine

## 2015-10-09 ENCOUNTER — Encounter: Payer: Self-pay | Admitting: Family Medicine

## 2015-10-09 VITALS — BP 106/66 | HR 91 | Temp 98.4°F | Wt 188.5 lb

## 2015-10-09 DIAGNOSIS — G6281 Critical illness polyneuropathy: Secondary | ICD-10-CM | POA: Diagnosis not present

## 2015-10-09 DIAGNOSIS — D696 Thrombocytopenia, unspecified: Secondary | ICD-10-CM

## 2015-10-09 DIAGNOSIS — R609 Edema, unspecified: Secondary | ICD-10-CM | POA: Diagnosis not present

## 2015-10-09 DIAGNOSIS — N179 Acute kidney failure, unspecified: Secondary | ICD-10-CM | POA: Diagnosis not present

## 2015-10-09 NOTE — Assessment & Plan Note (Signed)
Has f/u with renal pending.

## 2015-10-09 NOTE — Patient Instructions (Signed)
No labs today.   I'll await the notes from the other docs.  Cut the metoprolol in half.  That should help.  Take care.  Glad to see you.

## 2015-10-09 NOTE — Assessment & Plan Note (Addendum)
Continue gabapentin, will check about moving up PT.  Continue braces for B feet and fall precautions. >25 minutes spent in face to face time with patient, >50% spent in counselling or coordination of care.

## 2015-10-09 NOTE — Assessment & Plan Note (Signed)
We can check CBC re: PLT and HGB later on, wouldn't need recheck today, d/w pt.

## 2015-10-09 NOTE — Assessment & Plan Note (Signed)
Slowly improving, continue to monitor.

## 2015-10-09 NOTE — Progress Notes (Signed)
Pre visit review using our clinic review tool, if applicable. No additional management support is needed unless otherwise documented below in the visit note.  Visit not done as TCM visit since he has f/u with rehab pending.  Inpatient for flu with VDRF---->rhabdo---->mult infections---->deconditioning---->neuropathy s/p inpatient rehab with rehab and renal f/u pending.  Not yet in outpatient PT.  Hospital course d/w pt.  He doesn't episodic portions of hx when he was profoundly sick, but his memory appears normal o/w. D/w pt and wife about the strain of illness on both of them.    Anemia noted on iron.  No ADE on med.  Fatigue noted.  He had been on 25mg  BID of BB, d/w pt about cutting back to 12.5mg  BID, see AVS.  He was taking too much prev.   No fevers.  Still with gait changes noted from deconditioning and neuropathy.  Using braces to help with foot drop B.  BLE edema slowly improving.   PMH and SH reviewed  ROS: Per HPI unless specifically indicated in ROS section   Meds, vitals, and allergies reviewed.   nad ncat Mmm rrr Ctab abd soft, not ttp Ext with 1+ BLE edema Normal DP pulse but dark skin noted on the distal toes.   Weak B foot dorsiflexion.

## 2015-10-12 ENCOUNTER — Ambulatory Visit
Payer: BLUE CROSS/BLUE SHIELD | Attending: Physical Medicine & Rehabilitation | Admitting: Rehabilitative and Restorative Service Providers"

## 2015-10-12 DIAGNOSIS — M6281 Muscle weakness (generalized): Secondary | ICD-10-CM | POA: Diagnosis present

## 2015-10-12 DIAGNOSIS — R29818 Other symptoms and signs involving the nervous system: Secondary | ICD-10-CM | POA: Diagnosis present

## 2015-10-12 DIAGNOSIS — R2689 Other abnormalities of gait and mobility: Secondary | ICD-10-CM | POA: Diagnosis present

## 2015-10-12 NOTE — Therapy (Signed)
[facility] [address] [place], [place], [address] Phone: [phone]   Fax:  [phone]  Physical Therapy Evaluation  Patient Details  Name: John Hoover MRN: [id number] Date of Birth: 1965/06/04 Referring Provider: Maryla Morrow, MD  Encounter Date: 10/12/2015      PT End of Session - 10/12/15 1600    Visit Number 1   Number of Visits 18   Date for PT Re-Evaluation 12/11/15   Authorization Type 30 visit limit   PT Start Time 1320   PT Stop Time 1400   PT Time Calculation (min) 40 min   Equipment Utilized During Treatment Gait belt   Activity Tolerance Patient tolerated treatment well   Behavior During Therapy [facility] for tasks assessed/performed      Past Medical History  Diagnosis Date  . Meniere's disease   . Flu   . Rhabdomyolysis   . Respiratory failure (HCC)     VDRF flu 2017  . Anemia   . Neuropathy (HCC)   . Physical deconditioning   . AKI (acute kidney injury) (HCC)   . 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 Nasal sinus surgery      There were no vitals filed for this visit.       Subjective Assessment - 10/12/15 1329    Subjective The patient was hospitalized for 42 days between acute care, select care and IP rehab due to influenza with critical illness neuropathy.  He was d/c from IP rehab last Friday and is showering and going to rest room without walker and is increasing independence with activities.  He is using RW when out of the house.     Patient Stated Goals Return to prior level of functioning including running (did 2 miles /day).   Currently in Pain? Yes  nerve pain in feet   Pain Location Foot   Pain Orientation Right;Left   Pain Descriptors / Indicators Aching;Burning   Pain Type Acute pain   Pain Onset More than a month ago   Pain Frequency Constant   Aggravating Factors  none   Pain Relieving Factors oxycodone            [facility] PT Assessment - 10/12/15 1332    Assessment   Medical Diagnosis critical illness neuropathy   Referring Provider Maryla Morrow, MD   Onset Date/Surgical Date 08/27/15   Hand Dominance Right   Next MD Visit next week   Prior Therapy select care, IP rehab   Precautions   Precautions Fall   Required Braces or Orthoses Other Brace/Splint  foot up brace bilaterally; walks without braces   Balance Screen   Has the patient fallen in the past 6 months No   Has the patient had a decrease in activity level because of a fear of falling?  Yes  due to nature of injury/illness   Is the patient reluctant to leave their home because of a fear of falling?  Yes   Home Environment   Living Environment Private residence   Living Arrangements Spouse/significant other   Type of Home House   Home Access Stairs to enter   Entrance Stairs-Number of Steps 1-3   Entrance Stairs-Rails --  uses [employer] Two level;Able to live on main level with bedroom/bathroom   Alternate Level Stairs-Number of Steps 18   Home Equipment Walker - 2 wheels;Cane - single point   Additional Comments bedside commode-  not using   Prior Function   Level of Independence Independent   Vocation Full time employment  Product managerpaving project/manager, walks longer distances   Observation/Other Assessments   Focus on Therapeutic Outcomes (FOTO)  60%   Other Surveys  --  ABC score 63.1%   Sensation   Light Touch Impaired Detail   Light Touch Impaired Details Impaired LLE;Impaired RLE  also has numbness in fingertips, diminished LT   Additional Comments Numbness and burning in feet in sock distribution   ROM / Strength   AROM / PROM / Strength AROM;Strength   AROM   Overall AROM  Deficits   Overall AROM Comments tightness noted in bilateral shoulders with flexion/overhead reaching.  Diminished AROM bilateral ankles to neutral position.   Strength   Overall Strength Deficits   Overall Strength Comments UEs 5/5,  bilateral hip flexion 4/5, bilateral ankle DF <3/5 as patient unable to lift against gravity through available ROM   Ambulation/Gait   Ambulation/Gait Yes   Ambulation/Gait Assistance 4: Min guard;6: Modified independent (Device/Increase time)  mod I with RW and CGA with single point cane   Ambulation Distance (Feet) 300 Feet   Assistive device Straight cane;Rolling walker   Gait Pattern --  narrow BOS, foot slap bilaterally   Ambulation Surface Level   Gait velocity 2.20 ft/sec   Stairs Yes   Stairs Assistance 6: Modified independent (Device/Increase time)  with one rail and reciprocal pattern   Stair Management Technique --  without rails needs CGA for safety   Gait Comments PT educated patient on safe use of assistive device.    Standardized Balance Assessment   Standardized Balance Assessment Berg Balance Test   Berg Balance Test   Sit to Stand Able to stand  independently using hands   Standing Unsupported Able to stand safely 2 minutes   Sitting with Back Unsupported but Feet Supported on Floor or Stool Able to sit safely and securely 2 minutes   Stand to Sit Sits safely with minimal use of hands   Transfers Able to transfer safely, definite need of hands   Standing Unsupported with Eyes Closed Able to stand 10 seconds safely   Standing Ubsupported with Feet Together Able to place feet together independently and stand 1 minute safely   From Standing, Reach Forward with Outstretched Arm Can reach confidently >25 cm (10")   From Standing Position, Pick up Object from Floor Able to pick up shoe safely and easily   From Standing Position, Turn to Look Behind Over each Shoulder Turn sideways only but maintains balance   Turn 360 Degrees Able to turn 360 degrees safely but slowly   Standing Unsupported, Alternately Place Feet on Step/Stool Able to complete >2 steps/needs minimal assist   Standing Unsupported, One Foot in Front Able to plae foot ahead of the other independently and  hold 30 seconds   Standing on One Leg Tries to lift leg/unable to hold 3 seconds but remains standing independently   Total Score 43   Berg comment: 43/56 indicating high fall risk             PT Education - 10/12/15 1400    Education provided Yes   Education Details safe/correct use of SPC, heel cord stretch standing   Person(s) Educated Patient   Methods Explanation;Demonstration;Handout   Comprehension Verbalized understanding;Returned demonstration          PT Short Term Goals - 10/12/15 1601    PT SHORT TERM GOAL #1   Title  The patient will return demo HEP for balance, LE strength (ankles), and general mobility.   Baseline Target date 11/11/2015   Time 4   Period Weeks   PT SHORT TERM GOAL #2   Title The patient will demonstrate correct sequencing of SPC for household mobility to improve safety with device.   Baseline Target date 11/11/2015   Time 4   Period Weeks   PT SHORT TERM GOAL #3   Title The patient will negotiate 4 steps without a handrail modified indep to demo improved community surface negotiation.   Baseline Target date 11/11/2015   Time 4   Period Weeks   PT SHORT TERM GOAL #4   Title The patient will negotiate level community surfaces wth SPC modified indep x 1000 ft for dec'd dependence on RW.   Baseline Target date 11/11/2015   Time 4   Period Weeks   PT SHORT TERM GOAL #5   Title The patient will improve Berg balance score from 43/56 up to 48/56 to demo dec'd risk for falls.   Baseline Target date 11/11/2015   Time 4   Period Weeks           PT Long Term Goals - 10/12/15 1607    PT LONG TERM GOAL #1   Title The patient will improve ABC scale by 15% (baseline 63%).   Baseline Target date 12/11/2015   Time 8   Period Weeks   PT LONG TERM GOAL #2   Title The patient will imrpove ankle strength to > or equal to 4/5 for improved foot clearance during gait activities.   Baseline Target date 12/11/2015   Time 8   Period Weeks   PT LONG TERM  GOAL #3   Title The patient will improve gait speed from 2.20 ft/sec to > or equal to 3.2 ft/sec to demo return to full community ambulation and faster gait speed.   Baseline Target date 12/11/2015   Time 8   Period Weeks   PT LONG TERM GOAL #4   Title The patient will improve Berg from 43/56 to > or equal to 52/56 to demo improved high level balance control.   Baseline Target date 12/11/2015   Time 8   Period Weeks   PT LONG TERM GOAL #5   Title The patient will negotiate level/unlevel community surfaces without a device independently.   Baseline Target date 12/11/2015   Time 8   Period Weeks   Additional Long Term Goals   Additional Long Term Goals Yes   PT LONG TERM GOAL #6   Title The patient will demo slow jog x 100 ft to begin to demo ability to return to prior exercise level.   Baseline Target date 12/11/2015   Time 8   Period Weeks               Plan - 10/12/15 1610    Clinical Impression Statement The patient is a 50 yo male s/p influenza with critical illness neuropathy presenting to OP physical therapy with sensory and motor changes.  He is currently not working due to recent illness and current mobility status.   Rehab Potential Good   PT Frequency 3x / week  Scheduled to begin at 3x/week fo r2 weeks to establish HEP and then reduce to 2x/week   PT Duration 8 weeks   PT Treatment/Interventions ADLs/Self Care Home Management;Therapeutic exercise;Therapeutic activities;Functional mobility training;Gait training;Patient/family education;Balance training;Neuromuscular re-education   PT Next Visit Plan Standing HEP, gait training on  treadmill for home, ankle strength/stretching, ankle med/lat stability, gait without foot up braces.   Consulted and Agree with Plan of Care Patient      Patient will benefit from skilled therapeutic intervention in order to improve the following deficits and impairments:  Abnormal gait, Decreased range of motion, Difficulty walking, Pain,  Decreased endurance, Impaired sensation, Decreased strength, Decreased mobility, Decreased balance  Visit Diagnosis: Other abnormalities of gait and mobility  Muscle weakness (generalized)  Other symptoms and signs involving the nervous system     Problem List Patient Active Problem List   Diagnosis Date Noted  . Critical illness polyneuropathy (HCC) 09/30/2015  . Debility 09/24/2015  . Weakness   . Abnormality of gait   . Pain   . Neuropathic pain   . Anxiety state   . Yeast infection   . Anemia of chronic disease   . Hypokalemia   . Peripheral edema   . ATN (acute tubular necrosis) (HCC)   . Scrotal edema   . Thrombocytopenia (HCC)   . MODS (multiple organ dysfunction syndrome)   . Endotracheally intubated   . Hypovolemic shock (HCC)   . Acute respiratory failure (HCC)   . AKI (acute kidney injury) (HCC)   . Lactic acidosis   . Hypoxia 08/27/2015  . Rhabdomyolysis 08/27/2015  . Hyperkalemia 08/27/2015  . ARF (acute renal failure) (HCC) 08/27/2015  . Routine general medical examination at a health care facility 08/23/2014  . Advance care planning 08/23/2014  . Meniere disease 04/08/2011  . ALLERGIC RHINITIS, SEASONAL 12/02/2007    Joleigh Mineau, PT 10/12/2015, 4:16 PM  [facility] [address] [place], [place], [address] Phone: [phone]   Fax:  [phone]  Name: TANNER YELEY MRN: [id number] Date of Birth: 1966/03/31

## 2015-10-12 NOTE — Patient Instructions (Signed)
Heel Cord Stretch    Place one leg forward, bent, other leg behind and straight. Lean forward keeping back heel flat. Hold __30__ seconds while counting out loud. Repeat with other leg. Repeat _3___ times. Do _2___ sessions per day.  http://gt2.exer.us/511   Copyright  VHI. All rights reserved.

## 2015-10-15 ENCOUNTER — Other Ambulatory Visit: Payer: Self-pay

## 2015-10-15 ENCOUNTER — Ambulatory Visit: Payer: BLUE CROSS/BLUE SHIELD | Admitting: Rehabilitative and Restorative Service Providers"

## 2015-10-15 DIAGNOSIS — R2689 Other abnormalities of gait and mobility: Secondary | ICD-10-CM | POA: Diagnosis not present

## 2015-10-15 DIAGNOSIS — R29818 Other symptoms and signs involving the nervous system: Secondary | ICD-10-CM

## 2015-10-15 DIAGNOSIS — M6281 Muscle weakness (generalized): Secondary | ICD-10-CM

## 2015-10-15 MED ORDER — OXYCODONE HCL 5 MG PO TABS: ORAL_TABLET | ORAL | Status: DC

## 2015-10-15 NOTE — Therapy (Signed)
[facility] [address] [place], [place], [address] Phone: [phone]   Fax:  [phone]  Physical Therapy Treatment  Patient Details  Name: John Hoover MRN: [id number] Date of Birth: 1966-04-07 Referring Provider: Maryla MorrowAnkit Patel, MD  Encounter Date: 10/15/2015      PT End of Session - 10/15/15 1301    Visit Number 2   Number of Visits 18   Date for PT Re-Evaluation 12/11/15   Authorization Type 30 visit limit   PT Start Time 1103   PT Stop Time 1145   PT Time Calculation (min) 42 min   Equipment Utilized During Treatment Gait belt   Activity Tolerance Patient tolerated treatment well   Behavior During Therapy [facility] for tasks assessed/performed      Past Medical History  Diagnosis Date  . Meniere's disease   . Flu   . Rhabdomyolysis   . Respiratory failure (HCC)     VDRF flu 2017  . Anemia   . Neuropathy (HCC)   . Physical deconditioning   . AKI (acute kidney injury) (HCC)   . Critical illness polyneuropathy [facility](HCC)     Past Surgical History  Procedure Laterality Date  . Nasal sinus surgery      There were no vitals filed for this visit.      Subjective Assessment - 10/15/15 1300    Subjective The patient wore foot up braces, but does not feel they are currently helping him with walking.  He is doing home exercises as prescribed from IP rehab and heel cord stretch from last week at OP PT.   Patient Stated Goals Return to prior level of functioning including running (did 2 miles /day).   Currently in Pain? Yes   Pain Score --  not rated   Pain Location Foot   Pain Orientation Right;Left   Pain Descriptors / Indicators Aching;Burning   Pain Type Acute pain   Pain Onset More than a month ago   Pain Frequency Constant   Aggravating Factors  worse in PT today with weight bearing through toes in quadriped   Pain Relieving Factors oxycodone      Gait: Treadmill walking x 6 minutes at 2.0  mph-2.4 mph  Gait in the clinic working on toe walking with CGA with intermittent UE support Gait with and without foot up braces to compare with same incidence of foot slap during ambulation with or without braces.  THERAPEUTIC EXERCISE: Standing mini squat with weight shift unilateral and contralateral LE foot taps x 10 reps each side Sidestep squats Standing heel raises x 20 reps Seated toe raises x 10 reps with ankle dorsiflexion with red theraband Seated ankle eversion x 10 reps without resistance  Quadriped hip extension with contralateral UE extension Quadriped working on pushing into down dog, however difficult to perform due to toe pain  Standing heel cord stretch      PT Education - 10/15/15 1140    Education provided Yes   Education Details HEP: treadmill walking, mini squats with weight shiftring, heel raises, toe raises, eversion.   Person(s) Educated Patient   Methods Explanation;Demonstration;Handout   Comprehension Returned demonstration;Verbalized understanding          PT Short Term Goals - 10/12/15 1601    PT SHORT TERM GOAL #1   Title The patient will return demo HEP for balance, LE strength (ankles), and general mobility.   Baseline Target date 11/11/2015   Time 4   Period Weeks   PT  SHORT TERM GOAL #2   Title The patient will demonstrate correct sequencing of SPC for household mobility to improve safety with device.   Baseline Target date 11/11/2015   Time 4   Period Weeks   PT SHORT TERM GOAL #3   Title The patient will negotiate 4 steps without a handrail modified indep to demo improved community surface negotiation.   Baseline Target date 11/11/2015   Time 4   Period Weeks   PT SHORT TERM GOAL #4   Title The patient will negotiate level community surfaces wth SPC modified indep x 1000 ft for dec'd dependence on RW.   Baseline Target date 11/11/2015   Time 4   Period Weeks   PT SHORT TERM GOAL #5   Title The patient will improve Berg balance  score from 43/56 up to 48/56 to demo dec'd risk for falls.   Baseline Target date 11/11/2015   Time 4   Period Weeks           PT Long Term Goals - 10/12/15 1607    PT LONG TERM GOAL #1   Title The patient will improve ABC scale by 15% (baseline 63%).   Baseline Target date 12/11/2015   Time 8   Period Weeks   PT LONG TERM GOAL #2   Title The patient will imrpove ankle strength to > or equal to 4/5 for improved foot clearance during gait activities.   Baseline Target date 12/11/2015   Time 8   Period Weeks   PT LONG TERM GOAL #3   Title The patient will improve gait speed from 2.20 ft/sec to > or equal to 3.2 ft/sec to demo return to full community ambulation and faster gait speed.   Baseline Target date 12/11/2015   Time 8   Period Weeks   PT LONG TERM GOAL #4   Title The patient will improve Berg from 43/56 to > or equal to 52/56 to demo improved high level balance control.   Baseline Target date 12/11/2015   Time 8   Period Weeks   PT LONG TERM GOAL #5   Title The patient will negotiate level/unlevel community surfaces without a device independently.   Baseline Target date 12/11/2015   Time 8   Period Weeks   Additional Long Term Goals   Additional Long Term Goals Yes   PT LONG TERM GOAL #6   Title The patient will demo slow jog x 100 ft to begin to demo ability to return to prior exercise level.   Baseline Target date 12/11/2015   Time 8   Period Weeks               Plan - 10/15/15 1302    Clinical Impression Statement The patient tolerated HEP progression well today.  He had 2 incidents of foot drag in therapy when fatigued and was able to self recover.  Continue to STGs/LTGs.   Rehab Potential Good   PT Treatment/Interventions ADLs/Self Care Home Management;Therapeutic exercise;Therapeutic activities;Functional mobility training;Gait training;Patient/family education;Balance training;Neuromuscular re-education   PT Next Visit Plan Check HEP, distal LE  strengthening, gait training, balance activities engaging ankles.   Consulted and Agree with Plan of Care Patient      Patient will benefit from skilled therapeutic intervention in order to improve the following deficits and impairments:  Abnormal gait, Decreased range of motion, Difficulty walking, Pain, Decreased endurance, Impaired sensation, Decreased strength, Decreased mobility, Decreased balance  Visit Diagnosis: Other abnormalities of gait and mobility  Muscle  weakness (generalized)  Other symptoms and signs involving the nervous system     Problem List Patient Active Problem List   Diagnosis Date Noted  . Critical illness polyneuropathy (HCC) 09/30/2015  . Debility 09/24/2015  . Weakness   . Abnormality of gait   . Pain   . Neuropathic pain   . Anxiety state   . Yeast infection   . Anemia of chronic disease   . Hypokalemia   . Peripheral edema   . ATN (acute tubular necrosis) (HCC)   . Scrotal edema   . Thrombocytopenia (HCC)   . MODS (multiple organ dysfunction syndrome)   . Endotracheally intubated   . Hypovolemic shock (HCC)   . Acute respiratory failure (HCC)   . AKI (acute kidney injury) (HCC)   . Lactic acidosis   . Hypoxia 08/27/2015  . Rhabdomyolysis 08/27/2015  . Hyperkalemia 08/27/2015  . ARF (acute renal failure) (HCC) 08/27/2015  . Routine general medical examination at a health care facility 08/23/2014  . Advance care planning 08/23/2014  . Meniere disease 04/08/2011  . ALLERGIC RHINITIS, SEASONAL 12/02/2007    Shanel Prazak, PT 10/15/2015, 1:08 PM  [facility] [address] [place], [place], [address] Phone: [phone]   Fax:  [phone]  Name: John Hoover MRN: [id number] Date of Birth: [birth year]

## 2015-10-15 NOTE — Telephone Encounter (Signed)
John Hoover, pts wife left v/m;(Do not see DPR); pt was last seen for hospital f/u on 10/09/15. Request rx for oxycodone. Call when ready for pick up; Last refilled # [phone] on 10/05/15 by Delle ReiningPamela Love PA. John Hoover said pt was in hospital for 40 days due to flu; now pt having signs of depression,nervous and anxious,worrying. [name]No f/u appt scheduled.

## 2015-10-15 NOTE — Telephone Encounter (Signed)
Patient advised.  Rx left at front desk for pick up. 

## 2015-10-15 NOTE — Patient Instructions (Signed)
TREADMILL:  2.0 mph-2.4 mph holding on with both hands USE safety lanyard Begin 6-8 minutes 1 time/day for warm up before doing HEP   (Home) Squat: Unilateral WB (Assist)    Using countertop as supports close to body, stand on left leg, other leg taps to the side. Squat on one leg without moving knee from side to side.  10 taps with right foot, then switch sides. Repeat __10__ times per set. Do __1__ sets per session. Do __2__ sessions per day.  Copyright  VHI. All rights reserved.   Heel Raise: Bilateral (Standing)    Rise on balls of feet.  Be near a support surface *can use 1-2 fingers for support* Repeat __20__ times per set. Do __1__ sets per session. Do __2__ sessions per day.  http://orth.exer.us/38   Copyright  VHI. All rights reserved.  FLEXION: Sitting - Resistance Band (Active)    Sit with right foot flat. Against red resistance band, bend ankle, bringing toes toward head. Complete _1__ sets of _10-15__ repetitions. Perform _2__ sessions per day.  Copyright  VHI. All rights reserved.   ANKLE: Eversion, Bilateral    Sit at edge of surface, feet on floor. Raise toes of both feet up and move them away from body. Do not move hips or knees (can put fist between knees to keep hips from moving). _10-15__ reps per set, _2__ sets per day.  Copyright  VHI. All rights reserved.

## 2015-10-15 NOTE — Telephone Encounter (Signed)
Printed.  Short term rx is okay.  Needs f/u re: mood (depression,nervous and anxious,worrying)- this is common after this level of illness and needs to be addressd.  Thanks.

## 2015-10-18 ENCOUNTER — Encounter: Payer: Self-pay | Admitting: Physical Medicine & Rehabilitation

## 2015-10-18 ENCOUNTER — Encounter
Payer: BLUE CROSS/BLUE SHIELD | Attending: Physical Medicine & Rehabilitation | Admitting: Physical Medicine & Rehabilitation

## 2015-10-18 ENCOUNTER — Encounter: Payer: BLUE CROSS/BLUE SHIELD | Admitting: Physical Medicine & Rehabilitation

## 2015-10-18 ENCOUNTER — Ambulatory Visit: Payer: BLUE CROSS/BLUE SHIELD | Admitting: Rehabilitative and Restorative Service Providers"

## 2015-10-18 VITALS — BP 125/77 | HR 96 | Resp 16

## 2015-10-18 DIAGNOSIS — Z5181 Encounter for therapeutic drug level monitoring: Secondary | ICD-10-CM | POA: Diagnosis not present

## 2015-10-18 DIAGNOSIS — M792 Neuralgia and neuritis, unspecified: Secondary | ICD-10-CM | POA: Diagnosis not present

## 2015-10-18 DIAGNOSIS — G894 Chronic pain syndrome: Secondary | ICD-10-CM | POA: Diagnosis not present

## 2015-10-18 DIAGNOSIS — R200 Anesthesia of skin: Secondary | ICD-10-CM | POA: Diagnosis not present

## 2015-10-18 DIAGNOSIS — G4700 Insomnia, unspecified: Secondary | ICD-10-CM | POA: Diagnosis not present

## 2015-10-18 DIAGNOSIS — G6281 Critical illness polyneuropathy: Secondary | ICD-10-CM

## 2015-10-18 DIAGNOSIS — M7989 Other specified soft tissue disorders: Secondary | ICD-10-CM | POA: Insufficient documentation

## 2015-10-18 DIAGNOSIS — Z79899 Other long term (current) drug therapy: Secondary | ICD-10-CM

## 2015-10-18 DIAGNOSIS — J9690 Respiratory failure, unspecified, unspecified whether with hypoxia or hypercapnia: Secondary | ICD-10-CM | POA: Diagnosis not present

## 2015-10-18 DIAGNOSIS — R609 Edema, unspecified: Secondary | ICD-10-CM

## 2015-10-18 DIAGNOSIS — R269 Unspecified abnormalities of gait and mobility: Secondary | ICD-10-CM | POA: Diagnosis not present

## 2015-10-18 DIAGNOSIS — D649 Anemia, unspecified: Secondary | ICD-10-CM | POA: Diagnosis not present

## 2015-10-18 DIAGNOSIS — H8109 Meniere's disease, unspecified ear: Secondary | ICD-10-CM | POA: Diagnosis not present

## 2015-10-18 DIAGNOSIS — N170 Acute kidney failure with tubular necrosis: Secondary | ICD-10-CM | POA: Diagnosis not present

## 2015-10-18 DIAGNOSIS — R61 Generalized hyperhidrosis: Secondary | ICD-10-CM | POA: Diagnosis not present

## 2015-10-18 DIAGNOSIS — R29818 Other symptoms and signs involving the nervous system: Secondary | ICD-10-CM

## 2015-10-18 DIAGNOSIS — M6281 Muscle weakness (generalized): Secondary | ICD-10-CM

## 2015-10-18 DIAGNOSIS — R600 Localized edema: Secondary | ICD-10-CM

## 2015-10-18 DIAGNOSIS — R2689 Other abnormalities of gait and mobility: Secondary | ICD-10-CM

## 2015-10-18 MED ORDER — GABAPENTIN 600 MG PO TABS: 600.0000 mg | ORAL_TABLET | Freq: Two times a day (BID) | ORAL | Status: DC

## 2015-10-18 MED ORDER — OXYCODONE HCL 5 MG PO TABS: ORAL_TABLET | ORAL | Status: DC

## 2015-10-18 MED ORDER — AMITRIPTYLINE HCL 150 MG PO TABS: 150.0000 mg | ORAL_TABLET | Freq: Every day | ORAL | Status: DC

## 2015-10-18 NOTE — Therapy (Signed)
[facility] [address] [place], [place], [address] Phone: [phone]   Fax:  [phone]  Physical Therapy Treatment  Patient Details  Name: John Hoover MRN: [id number] Date of Birth: May 01, 1966 Referring Provider: Maryla Morrow, MD  Encounter Date: 10/18/2015      PT End of Session - 10/18/15 1109    Visit Number 3   Number of Visits 18   Date for PT Re-Evaluation 12/11/15   Authorization Type 30 visit limit   PT Start Time 1105   PT Stop Time 1145   PT Time Calculation (min) 40 min   Equipment Utilized During Treatment Gait belt   Activity Tolerance Patient tolerated treatment well   Behavior During Therapy [facility] for tasks assessed/performed      Past Medical History  Diagnosis Date  . Meniere's disease   . Flu   . Rhabdomyolysis   . Respiratory failure (HCC)     VDRF flu 2017  . Anemia   . Neuropathy (HCC)   . Physical deconditioning   . AKI (acute kidney injury) (HCC)   . Critical illness polyneuropathy [facility])     Past Surgical History  Procedure Laterality Date  . Nasal sinus surgery      There were no vitals filed for this visit.      Subjective Assessment - 10/18/15 1107    Subjective The patient is not using foot up braces today.  He reports he is going to participate in a father/daughter dance in June with his daughter.    Patient Stated Goals Return to prior level of functioning including running (did 2 miles /day).   Currently in Pain? Yes   Pain Location Foot   Pain Orientation Right;Left   Pain Descriptors / Indicators Aching;Burning   Pain Type Acute pain   Pain Onset More than a month ago   Pain Frequency Constant   Aggravating Factors  occasional shart, shooting pain, unsure   Pain Relieving Factors oxycodone      THERAPEUTIC EXERCISE: Lunges x 10 reps R and L sides Hamstring stool pulls x 115 feet alternating R/L sides Prone hip extension with 2 lbs x 10 reps  R/L Sidelying hip abduction with 2 lbs x 10 reps R/L Quad stretch prone added to HEP.     NEUROMUSCULAR RE-EDUCATION: Rocker board standing with lateral weight shift and then ant/posterior weight shifting Reactive balance on rocker board  Eyes closed on rocker board providing min A for safety Sidestep lunges off of rocker board with min A with help to stabilize ankle/rocker board Ant/post stepping off of rocker board with min A        PT Education - 10/18/15 1147    Education provided Yes   Education Details quad stretch   Person(s) Educated Patient   Methods Explanation;Demonstration;Handout   Comprehension Verbalized understanding;Returned demonstration          PT Short Term Goals - 10/12/15 1601    PT SHORT TERM GOAL #1   Title The patient will return demo HEP for balance, LE strength (ankles), and general mobility.   Baseline Target date 11/11/2015   Time 4   Period Weeks   PT SHORT TERM GOAL #2   Title The patient will demonstrate correct sequencing of SPC for household mobility to improve safety with device.   Baseline Target date 11/11/2015   Time 4   Period Weeks   PT SHORT TERM GOAL #3   Title The patient will negotiate 4 steps  without a handrail modified indep to demo improved community surface negotiation.   Baseline Target date 11/11/2015   Time 4   Period Weeks   PT SHORT TERM GOAL #4   Title The patient will negotiate level community surfaces wth SPC modified indep x 1000 ft for dec'd dependence on RW.   Baseline Target date 11/11/2015   Time 4   Period Weeks   PT SHORT TERM GOAL #5   Title The patient will improve Berg balance score from 43/56 up to 48/56 to demo dec'd risk for falls.   Baseline Target date 11/11/2015   Time 4   Period Weeks           PT Long Term Goals - 10/12/15 1607    PT LONG TERM GOAL #1   Title The patient will improve ABC scale by 15% (baseline 63%).   Baseline Target date 12/11/2015   Time 8   Period Weeks   PT LONG  TERM GOAL #2   Title The patient will imrpove ankle strength to > or equal to 4/5 for improved foot clearance during gait activities.   Baseline Target date 12/11/2015   Time 8   Period Weeks   PT LONG TERM GOAL #3   Title The patient will improve gait speed from 2.20 ft/sec to > or equal to 3.2 ft/sec to demo return to full community ambulation and faster gait speed.   Baseline Target date 12/11/2015   Time 8   Period Weeks   PT LONG TERM GOAL #4   Title The patient will improve Berg from 43/56 to > or equal to 52/56 to demo improved high level balance control.   Baseline Target date 12/11/2015   Time 8   Period Weeks   PT LONG TERM GOAL #5   Title The patient will negotiate level/unlevel community surfaces without a device independently.   Baseline Target date 12/11/2015   Time 8   Period Weeks   Additional Long Term Goals   Additional Long Term Goals Yes   PT LONG TERM GOAL #6   Title The patient will demo slow jog x 100 ft to begin to demo ability to return to prior exercise level.   Baseline Target date 12/11/2015   Time 8   Period Weeks               Plan - 10/18/15 1147    Clinical Impression Statement The patient is highly motivated and inquires about return to driving and part time work schedule.  PT recommended the patient discuss further with MD for a plan.  He is walking without toe up braces and has intermittent foot catching that he is able to recover from.  PT recommends avoiding unlevel ground at this time.  Patient has also stopped using all assistive devices in home community.    PT Treatment/Interventions ADLs/Self Care Home Management;Therapeutic exercise;Therapeutic activities;Functional mobility training;Gait training;Patient/family education;Balance training;Neuromuscular re-education   PT Next Visit Plan Check HEP, distal LE strengthening, gait training, balance activities engaging ankles.   Consulted and Agree with Plan of Care Patient      Patient  will benefit from skilled therapeutic intervention in order to improve the following deficits and impairments:  Abnormal gait, Decreased range of motion, Difficulty walking, Pain, Decreased endurance, Impaired sensation, Decreased strength, Decreased mobility, Decreased balance  Visit Diagnosis: Other abnormalities of gait and mobility  Muscle weakness (generalized)  Other symptoms and signs involving the nervous system     Problem List  Patient Active Problem List   Diagnosis Date Noted  . Critical illness polyneuropathy (HCC) 09/30/2015  . Debility 09/24/2015  . Weakness   . Abnormality of gait   . Pain   . Neuropathic pain   . Anxiety state   . Yeast infection   . Anemia of chronic disease   . Hypokalemia   . Peripheral edema   . ATN (acute tubular necrosis) (HCC)   . Scrotal edema   . Thrombocytopenia (HCC)   . MODS (multiple organ dysfunction syndrome)   . Endotracheally intubated   . Hypovolemic shock (HCC)   . Acute respiratory failure (HCC)   . AKI (acute kidney injury) (HCC)   . Lactic acidosis   . Hypoxia 08/27/2015  . Rhabdomyolysis 08/27/2015  . Hyperkalemia 08/27/2015  . ARF (acute renal failure) (HCC) 08/27/2015  . Routine general medical examination at a health care facility 08/23/2014  . Advance care planning 08/23/2014  . Meniere disease 04/08/2011  . ALLERGIC RHINITIS, SEASONAL 12/02/2007    Mairim Bade, PT 10/18/2015, 11:49 AM  [facility] [address] [place], [place], [address] Phone: [phone]   Fax:  [phone]  Name: JAKAIDEN FILL MRN: [id number] Date of Birth: 09/11/1965

## 2015-10-18 NOTE — Patient Instructions (Signed)
Quadriceps (Prone)    On stomach with sheet around ONE ANKLE, knees together, hips down, pull heels toward bottom. Keep hips flat. Hold _20-30___ seconds. Repeat __2-3__ times. Do __1-2__ sessions per day. CAUTION: Stretch should be gentle, steady and slow.  Copyright  VHI. All rights reserved.

## 2015-10-18 NOTE — Addendum Note (Signed)
Addended by: Letta KocherHADLEY, Brenleigh Collet A on: 10/18/2015 02:53 PM   Modules accepted: Orders

## 2015-10-18 NOTE — Progress Notes (Addendum)
Subjective:    Patient ID: ARNE SCHLENDER, male    DOB: 1965-11-02, 50 y.o.   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 Admit date: 09/24/2015 Discharge date: 10/05/2015 At discharge he was encouraged to wear a foot up brace.  He was told by outpt therapist that it is not providing him with significant benefit anymore.  He is not driving.  He is not taking NSAIDs. He has a lot of pain in his b/l feet. It is numb and tingling with intermittent shooting pain.  Today, severity is 7/10.  He has some neuropathy in his fingers as well, but not as bad as his feet.  Denies falls. Pt saw his PCP. He has an appointment to see Nephrology next week.    Therapies: 2/week PT Mobility: No assistive device.  DME: Shower chair and bedside commode, but does not require anymore   Pain Inventory Average Pain 7 Pain Right Now 7 My pain is sharp, dull, stabbing and tingling  In the last 24 hours, has pain interfered with the following? General activity 5 Relation with others 5 Enjoyment of life 7 What TIME of day is your pain at its worst? night Sleep (in general) Poor  Pain is worse with: NA Pain improves with: medication Relief from Meds: 7  Mobility walk without assistance how many minutes can you walk? 30  Function employed # of hrs/week 60 what is your job? [employer]  Neuro/Psych numbness trouble walking anxiety  Prior Studies Any changes since last visit?  no  Physicians involved in your care Any changes since last visit?  no   Family History  Problem Relation Age of Onset  . Hypertension Mother   . Hypertension Father   . Colon cancer Neg Hx   . Prostate cancer Neg Hx    Social History   Social History  . Marital Status: Married    Spouse Name: N/A  . Number of Children: N/A  . Years of Education: N/A   Occupational History  . Works in Product/process development scientist    Social History Main Topics  . Smoking  status: Never Smoker   . Smokeless tobacco: Never Used  . Alcohol Use: Yes     Comment: weendends  . Drug Use: No  . Sexual Activity: Not Asked   Other Topics Concern  . None   Social History Narrative   1 daughter (who has DM1) and 1 son   Married 25+ years   Past Surgical History  Procedure Laterality Date  . Nasal sinus surgery     Past Medical History  Diagnosis Date  . Meniere's disease   . Flu   . Rhabdomyolysis   . Respiratory failure (HCC)     VDRF flu 2017  . Anemia   . Neuropathy (HCC)   . Physical deconditioning   . AKI (acute kidney injury) (HCC)   . Critical illness polyneuropathy (HCC)    BP 125/77 mmHg  Pulse 96  Resp 16  SpO2 100%  Opioid Risk Score:   Fall Risk Score:  `1  Depression screen PHQ 2/9  Depression screen PHQ 2/9 10/18/2015  Decreased Interest 1  Down, Depressed, Hopeless 0  PHQ - 2 Score 1   Review of Systems  Constitutional: Positive for diaphoresis.  Neurological: Positive for numbness.       Gait Instability  Psychiatric/Behavioral:       Anxiety  All other systems reviewed and  are negative.     Objective:   Physical Exam Constitutional: He appears well-developed and well-nourished. No distress.  HENT:  Normocephalic and atraumatic.   Eyes: Conjunctivae and EOM are normal.    Cardiovascular: Regular Rate and rhythm.    Respiratory: Effort normal. No respiratory distress. Clear to ausculation.   GI: Soft. Bowel sounds are normal. He exhibits no distension. There is no tenderness.  Musculoskeletal: He exhibits edema and tenderness in bilateral feet.  1 + edema with bilateral ankles (Improved).   Gait: Mild steppage gait Neurological: He is alert and oriented.  Motor: 4+/5 throughout, except bilateral ankle dorsi flexion: 4/5, but limited ROM Left worse than right  Skin: Skin is warm and dry. No rash noted.  Ischemic changes to mainly to toes (continues to improve); fingertips resolved Psychiatric: His behavior is  normal. Thought content normal. Cognition and memory are normal.    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  1. Weakness, abnormality of gait, pain secondary to CIP   Cont therapies  Encouraged PROM to b/l ankles   2. Pain management with Neuropathic pain  Will increase Neurontin form 300 TID to 600 BID.  Will add Elavil 150mg , instructed pt on titration  Will order Oxycodone 5mg  q6 PRN, will wean.  Will plan to start NSAID after eval with Nephro and Cr.  3. ATN  Pt to follow up with Nephrology next week  Cont fluids  4. Insomnia  Elavil 150mg  ordered   5. Peripheral edema:   Cont ACE wraps  Cont elevations   Cont limit salt intake  Encouraged use of ice if sensate    6. Abnormality of gait  Denies falls  Walker for uneven surfaces  Will not return to work due to demands of driving at this time.  Medications reviewed. Referral reviewed. All questions answered.

## 2015-10-22 ENCOUNTER — Ambulatory Visit: Payer: BLUE CROSS/BLUE SHIELD | Admitting: Rehabilitative and Restorative Service Providers"

## 2015-10-23 ENCOUNTER — Ambulatory Visit: Payer: BLUE CROSS/BLUE SHIELD | Admitting: Physical Therapy

## 2015-10-23 ENCOUNTER — Encounter: Payer: Self-pay | Admitting: Physical Therapy

## 2015-10-23 DIAGNOSIS — R2689 Other abnormalities of gait and mobility: Secondary | ICD-10-CM

## 2015-10-23 DIAGNOSIS — M6281 Muscle weakness (generalized): Secondary | ICD-10-CM

## 2015-10-23 DIAGNOSIS — R29818 Other symptoms and signs involving the nervous system: Secondary | ICD-10-CM

## 2015-10-23 NOTE — Therapy (Signed)
[facility] [address] [place], [place], [address] Phone: [phone]   Fax:  [phone]  Physical Therapy Treatment  Patient Details  Name: John Hoover MRN: [id number] Date of Birth: 04-Oct-1965 Referring Provider: Maryla Morrow, MD  Encounter Date: 10/23/2015      PT End of Session - 10/23/15 1538    Visit Number 4   Number of Visits 18   Date for PT Re-Evaluation 12/11/15   Authorization Type 30 visit limit   PT Start Time 1533   PT Stop Time 1615   PT Time Calculation (min) 42 min   Equipment Utilized During Treatment Gait belt   Activity Tolerance Patient tolerated treatment well   Behavior During Therapy [facility] for tasks assessed/performed      Past Medical History  Diagnosis Date  . Meniere's disease   . Flu   . Rhabdomyolysis   . Respiratory failure (HCC)     VDRF flu 2017  . Anemia   . Neuropathy (HCC)   . Physical deconditioning   . AKI (acute kidney injury) (HCC)   . 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 Nasal sinus surgery      There were no vitals filed for this visit.      Subjective Assessment - 10/23/15 1536    Subjective No new complaints. No falls to report. States the MD said he needs more motion in his ankles before he can be released to drive.   Patient Stated Goals Return to prior level of functioning including running (did 2 miles /day).   Currently in Pain? Yes   Pain Score 7    Pain Location Generalized  bil feet and 3 finger's of right hand   Pain Orientation Left;Right   Pain Descriptors / Indicators Tingling;Burning;Numbness   Pain Type Acute pain   Pain Onset More than a month ago   Aggravating Factors  unsure, notices more as pain med's wear off   Pain Relieving Factors oxycodone           [facility] Adult PT Treatment/Exercise - 10/23/15 1610    High Level Balance   High Level Balance Activities Other  (comment)  toe/heel walking fwd/bwd   High Level Balance Comments near parallel bars: x 3 laps each forward/backwards with min guard to min assist for balance. cues on ex form, posture and technique          Balance Exercises - 10/23/15 1605    Balance Exercises: Standing   Rockerboard Anterior/posterior;Lateral;Head turns;EO;EC;Other time (comment);10 reps   Balance Beam blue foam balance beam: tandem walking fwd/bwd x 3 laps, side stepping left<>right x 3 laps each way; standing with feet across foam beam: alternating large steps forward to floor and back onto foam x 10 each leg, alternating large steps backwards to floor and back to foam x 10 reps each leg; standing with feet across foam beam: alternating heel taps forward to floor and then back to foam x 10 reps, alternating toe taps backwards to floor and back to foam x 10 reps. No UE support with min to min guard assist for balance.                                   Balance Exercises: Standing   Rebounder Limitations performed both ways on balance board with no UE support: rocking with emphasis  on tall posture; holding board steady- EC x 15 sec's x 3 reps, EO alternating UE raises, Bil simultaneous UE raises x 10 reps each, EO head movement up<>down, left<>right and diagonals both ways x 10 each                                           PT Education - 10/23/15 1632    Education provided Yes   Education Details at counter: tandem walking fwd/bwd, toe walking fwd/bwd   Person(s) Educated Patient;Spouse   Methods Explanation;Demonstration;Handout   Comprehension Verbalized understanding;Returned demonstration;Need further instruction          PT Short Term Goals - 10/12/15 1601    PT SHORT TERM GOAL #1   Title The patient will return demo HEP for balance, LE strength (ankles), and general mobility.   Baseline Target date 11/11/2015   Time 4   Period Weeks   PT SHORT TERM GOAL #2   Title The patient will demonstrate correct  sequencing of SPC for household mobility to improve safety with device.   Baseline Target date 11/11/2015   Time 4   Period Weeks   PT SHORT TERM GOAL #3   Title The patient will negotiate 4 steps without a handrail modified indep to demo improved community surface negotiation.   Baseline Target date 11/11/2015   Time 4   Period Weeks   PT SHORT TERM GOAL #4   Title The patient will negotiate level community surfaces wth SPC modified indep x 1000 ft for dec'd dependence on RW.   Baseline Target date 11/11/2015   Time 4   Period Weeks   PT SHORT TERM GOAL #5   Title The patient will improve Berg balance score from 43/56 up to 48/56 to demo dec'd risk for falls.   Baseline Target date 11/11/2015   Time 4   Period Weeks           PT Long Term Goals - 10/12/15 1607    PT LONG TERM GOAL #1   Title The patient will improve ABC scale by 15% (baseline 63%).   Baseline Target date 12/11/2015   Time 8   Period Weeks   PT LONG TERM GOAL #2   Title The patient will imrpove ankle strength to > or equal to 4/5 for improved foot clearance during gait activities.   Baseline Target date 12/11/2015   Time 8   Period Weeks   PT LONG TERM GOAL #3   Title The patient will improve gait speed from 2.20 ft/sec to > or equal to 3.2 ft/sec to demo return to full community ambulation and faster gait speed.   Baseline Target date 12/11/2015   Time 8   Period Weeks   PT LONG TERM GOAL #4   Title The patient will improve Berg from 43/56 to > or equal to 52/56 to demo improved high level balance control.   Baseline Target date 12/11/2015   Time 8   Period Weeks   PT LONG TERM GOAL #5   Title The patient will negotiate level/unlevel community surfaces without a device independently.   Baseline Target date 12/11/2015   Time 8   Period Weeks   Additional Long Term Goals   Additional Long Term Goals Yes   PT LONG TERM GOAL #6   Title The patient will demo slow jog x 100 ft to begin  to demo ability to  return to prior exercise level.   Baseline Target date 12/11/2015   Time 8   Period Weeks            Plan - 10/23/15 1538    Clinical Impression Statement Today's session continued to address high level balance acitivites. Added dynamic gait balance activities to HEP today. Pt is making steady progress toward goals.    PT Treatment/Interventions ADLs/Self Care Home Management;Therapeutic exercise;Therapeutic activities;Functional mobility training;Gait training;Patient/family education;Balance training;Neuromuscular re-education   PT Next Visit Plan distal LE strengthening, gait training, balance activities engaging ankles.   Consulted and Agree with Plan of Care Patient      Patient will benefit from skilled therapeutic intervention in order to improve the following deficits and impairments:  Abnormal gait, Decreased range of motion, Difficulty walking, Pain, Decreased endurance, Impaired sensation, Decreased strength, Decreased mobility, Decreased balance  Visit Diagnosis: Other abnormalities of gait and mobility  Muscle weakness (generalized)  Other symptoms and signs involving the nervous system     Problem List Patient Active Problem List   Diagnosis Date Noted  . Critical illness polyneuropathy (HCC) 09/30/2015  . Debility 09/24/2015  . Weakness   . Abnormality of gait   . Pain   . Neuropathic pain   . Anxiety state   . Yeast infection   . Anemia of chronic disease   . Hypokalemia   . Peripheral edema   . ATN (acute tubular necrosis) (HCC)   . Scrotal edema   . Thrombocytopenia (HCC)   . MODS (multiple organ dysfunction syndrome)   . Endotracheally intubated   . Hypovolemic shock (HCC)   . Acute respiratory failure (HCC)   . AKI (acute kidney injury) (HCC)   . Lactic acidosis   . Hypoxia 08/27/2015  . Rhabdomyolysis 08/27/2015  . Hyperkalemia 08/27/2015  . ARF (acute renal failure) (HCC) 08/27/2015  . Routine general medical examination at a health care  facility 08/23/2014  . Advance care planning 08/23/2014  . Meniere disease 04/08/2011  . ALLERGIC RHINITIS, SEASONAL 12/02/2007    Sallyanne Kuster, PTA, [facility] [address] [place], [place] [address] [phone] 10/23/2015, 4:45 PM   Name: John Hoover MRN: [id number] Date of Birth: [birth year]

## 2015-10-23 NOTE — Patient Instructions (Signed)
Walking on Toes    At counter top for support: Walk on toes forward and then backwards while continuing on a straight path.  Perform 3 laps each way. Do ___1-2_ sessions per day.  Copyright  VHI. All rights reserved.  Feet Heel-Toe "Tandem"    At counter top for balance: arms at sides or on counter for balance, walk a straight line bringing one foot directly in front of the other and then backwards by bringing one foot directly behind the other one. Repeat for 3 laps in each direction. Do __1-2__ sessions per day.  Copyright  VHI. All rights reserved.

## 2015-10-24 ENCOUNTER — Ambulatory Visit: Payer: BLUE CROSS/BLUE SHIELD | Attending: Family Medicine | Admitting: Physical Therapy

## 2015-10-24 ENCOUNTER — Ambulatory Visit: Payer: BLUE CROSS/BLUE SHIELD | Admitting: Physical Therapy

## 2015-10-24 ENCOUNTER — Encounter: Payer: Self-pay | Admitting: Physical Therapy

## 2015-10-24 DIAGNOSIS — R2689 Other abnormalities of gait and mobility: Secondary | ICD-10-CM | POA: Insufficient documentation

## 2015-10-24 DIAGNOSIS — N179 Acute kidney failure, unspecified: Secondary | ICD-10-CM | POA: Diagnosis not present

## 2015-10-24 DIAGNOSIS — R29818 Other symptoms and signs involving the nervous system: Secondary | ICD-10-CM | POA: Insufficient documentation

## 2015-10-24 DIAGNOSIS — M792 Neuralgia and neuritis, unspecified: Secondary | ICD-10-CM | POA: Insufficient documentation

## 2015-10-24 DIAGNOSIS — M6281 Muscle weakness (generalized): Secondary | ICD-10-CM | POA: Diagnosis present

## 2015-10-24 DIAGNOSIS — N183 Chronic kidney disease, stage 3 (moderate): Secondary | ICD-10-CM | POA: Diagnosis not present

## 2015-10-24 NOTE — Patient Instructions (Addendum)
Balance, Proprioception: Hip Abduction With Tubing   With tubing attached to both ankles, Standing holding onto counter, kick one leg out to side and then Return.  Repeat _10___ times  On each side.  Do ___2_ sessions per day.  http://cc.exer.us/20    Balance, Proprioception: Hip Extension With Tubing   With tubing tied around both legs, holding onto kitchen counter, swing leg back. Return. Repeat _10___ times . Do __2__ sessions per day.  http://cc.exer.us/19     Band Walk: Side Stepping   Tie band around legs, around ankles. Step _10__ feet to one side, then step back to start. Repeat _2-3__ feet per session. Note: Small towel between band and skin eases rubbing.  http://plyo.exer.us/76   Copyright  VHI. All rights reserved.  Knee High    Holding stable object, raise knee to hip level, hold for 3 seconds then lower knee. Repeat with other knee. Repeat _15___ times each side. Do __2__ sessions per day.  http://[email or web address].exer.us/767   Copyright  VHI. All rights reserved.  SIT TO STAND: No Device   Sit with feet shoulder-width apart, on floor.(Make sure that you are in a chair that won't move like a chair against a wall or couch etc) Lean chest forward, raise hips up from surface. Straighten hips and knees. Weight bear equally on left and right sides. 10___ reps per set, _2__ sets per day, _5__ days per week Place left leg closer to sitting surface.  Copyright  VHI. All rights reserved.  Backward Walking   Walk backward, toes of each foot coming down first. Take long, even strides. Make sure you have a clear pathway with no obstructions when you do this. Stand beside counter and walk backward  And then walk forward doing opposite directions; repeat 10 laps 2x a day at least 5 days a week.  Copyright  VHI. All rights reserved.

## 2015-10-24 NOTE — Therapy (Signed)
[facility] MAIN [facility] SERVICES [address] [place], [place], [address] Phone: [phone]   Fax:  [phone]  Physical Therapy Treatment  Patient Details  Name: John Hoover MRN: [id number] Date of Birth: [birth year] Referring Provider: Maryla Morrow, MD  Encounter Date: 10/24/2015      PT End of Session - 10/24/15 1057    Visit Number 5   Number of Visits 18   Date for PT Re-Evaluation 12/11/15   Authorization Type 30 visit limit   PT Start Time 1015   PT Stop Time 1057   PT Time Calculation (min) 42 min   Activity Tolerance Patient tolerated treatment well   Behavior During Therapy [facility] for tasks assessed/performed      Past Medical History  Diagnosis Date  . Meniere's disease   . Flu   . Rhabdomyolysis   . Respiratory failure (HCC)     VDRF flu 2017  . Anemia   . Neuropathy (HCC)   . Physical deconditioning   . AKI (acute kidney injury) (HCC)   . 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 Nasal sinus surgery      There were no vitals filed for this visit.      Subjective Assessment - 10/24/15 1022    Subjective "I am doing okay today. I really want to get stronger in my ankles so that I can get back to driving. I really want to get back to work." Patient denies any pain; he does report some tingling in feet/ankles from time to time; Reports compliance with HEP;    Patient Stated Goals Return to prior level of functioning including running (did 2 miles /day).   Currently in Pain? No/denies   Pain Onset More than a month ago      TREATMENT: Warm up on Nustep BUE/BLE level 3 x4 min (Unbilled);  Quantum leg press: BLE plate 413# 2G40; patient required min VCs to slow down LE movement especially with eccentric return for better strengthening; BLE heel raises 120# 2x10 with cues to keep knee straight for better ankle movement;  Seated with red tband around both  feet: Ankle DF x10 bilaterally with min VCs for positioning for better ankle strengthening; Ankle EV x10 bilaterally with mod VCs to avoid hip/knee movement to isolate ankle strengthening;  Advanced HEP with standing exercise: Red tband around both ankles: Hip abduction x10 bilaterally; Hip extension x10 bilaterally; Side stepping 10 feet x3 laps unsupported for balance challenge; Patient required mod VCs to avoid trunk lean and to maintain SLS during task for better strengthening;    Forward/backward walking unsupported 10 feet x3 laps each direction with min VCs to increase step length and to increase base of support for better balance control; Standing alternate march with 1 finger tip hold 3 sec hold x10 each foot working on improving SLS ability; Required min VCs to increase core stabilization and improve trunk control for  Better balance;  Resisted weighted gait 12.5# forward/backward, side/side 4 way x2 laps each direction with CGA for balance and min VCs to increase step length for better strengthening;  Forward manual propulsion of treadmill x30 steps with supervision and cues to increase step length and increase push through toes for better ankle strengthening;   Patient required min-moderate verbal/tactile cues for correct exercise technique.              PT Education - 10/24/15 1057    Education  provided Yes   Education Details HEP advanced, strengthening, balance exercise   Person(s) Educated Patient   Methods Explanation;Verbal cues;Handout   Comprehension Verbalized understanding;Returned demonstration;Verbal cues required          PT Short Term Goals - 10/12/15 1601    PT SHORT TERM GOAL #1   Title The patient will return demo HEP for balance, LE strength (ankles), and general mobility.   Baseline Target date 11/11/2015   Time 4   Period Weeks   PT SHORT TERM GOAL #2   Title The patient will demonstrate correct sequencing of SPC for household mobility to  improve safety with device.   Baseline Target date 11/11/2015   Time 4   Period Weeks   PT SHORT TERM GOAL #3   Title The patient will negotiate 4 steps without a handrail modified indep to demo improved community surface negotiation.   Baseline Target date 11/11/2015   Time 4   Period Weeks   PT SHORT TERM GOAL #4   Title The patient will negotiate level community surfaces wth SPC modified indep x 1000 ft for dec'd dependence on RW.   Baseline Target date 11/11/2015   Time 4   Period Weeks   PT SHORT TERM GOAL #5   Title The patient will improve Berg balance score from 43/56 up to 48/56 to demo dec'd risk for falls.   Baseline Target date 11/11/2015   Time 4   Period Weeks           PT Long Term Goals - 10/12/15 1607    PT LONG TERM GOAL #1   Title The patient will improve ABC scale by 15% (baseline 63%).   Baseline Target date 12/11/2015   Time 8   Period Weeks   PT LONG TERM GOAL #2   Title The patient will imrpove ankle strength to > or equal to 4/5 for improved foot clearance during gait activities.   Baseline Target date 12/11/2015   Time 8   Period Weeks   PT LONG TERM GOAL #3   Title The patient will improve gait speed from 2.20 ft/sec to > or equal to 3.2 ft/sec to demo return to full community ambulation and faster gait speed.   Baseline Target date 12/11/2015   Time 8   Period Weeks   PT LONG TERM GOAL #4   Title The patient will improve Berg from 43/56 to > or equal to 52/56 to demo improved high level balance control.   Baseline Target date 12/11/2015   Time 8   Period Weeks   PT LONG TERM GOAL #5   Title The patient will negotiate level/unlevel community surfaces without a device independently.   Baseline Target date 12/11/2015   Time 8   Period Weeks   Additional Long Term Goals   Additional Long Term Goals Yes   PT LONG TERM GOAL #6   Title The patient will demo slow jog x 100 ft to begin to demo ability to return to prior exercise level.   Baseline  Target date 12/11/2015   Time 8   Period Weeks               Plan - 10/24/15 1058    Clinical Impression Statement Instructed patient in advanced strengthening and balance exercise. Advanced HEP. Patient instructed in SLS activities to faciliate ankle, knee and hip strengthening. He does require min VCs for correct exercise. Would benefit from additional skilled PT intervention to improve balance/gait safety and  return to PLOF. He is currently ambulating independently without AD.    Rehab Potential Good   PT Treatment/Interventions ADLs/Self Care Home Management;Therapeutic exercise;Therapeutic activities;Functional mobility training;Gait training;Patient/family education;Balance training;Neuromuscular re-education   PT Next Visit Plan distal LE strengthening, gait training, balance activities engaging ankles.   PT Home Exercise Plan advanced- see patient instructions;    Consulted and Agree with Plan of Care Patient      Patient will benefit from skilled therapeutic intervention in order to improve the following deficits and impairments:  Abnormal gait, Decreased range of motion, Difficulty walking, Pain, Decreased endurance, Impaired sensation, Decreased strength, Decreased mobility, Decreased balance  Visit Diagnosis: Other abnormalities of gait and mobility  Muscle weakness (generalized)  Other symptoms and signs involving the nervous system  Neuropathic pain     Problem List Patient Active Problem List   Diagnosis Date Noted  . Critical illness polyneuropathy (HCC) 09/30/2015  . Debility 09/24/2015  . Weakness   . Abnormality of gait   . Pain   . Neuropathic pain   . Anxiety state   . Yeast infection   . Anemia of chronic disease   . Hypokalemia   . Peripheral edema   . ATN (acute tubular necrosis) (HCC)   . Scrotal edema   . Thrombocytopenia (HCC)   . MODS (multiple organ dysfunction syndrome)   . Endotracheally intubated   . Hypovolemic shock (HCC)   .  Acute respiratory failure (HCC)   . AKI (acute kidney injury) (HCC)   . Lactic acidosis   . Hypoxia 08/27/2015  . Rhabdomyolysis 08/27/2015  . Hyperkalemia 08/27/2015  . ARF (acute renal failure) (HCC) 08/27/2015  . Routine general medical examination at a health care facility 08/23/2014  . Advance care planning 08/23/2014  . Meniere disease 04/08/2011  . ALLERGIC RHINITIS, SEASONAL 12/02/2007    Trotter,Margaret PT, DPT 10/24/2015, 11:00 AM  [facility] MAIN [facility] SERVICES [address] [place], [place], [address] Phone: [phone]   Fax:  [phone]  Name: John Hoover MRN: [id number] Date of Birth: 04-21-1966

## 2015-10-25 ENCOUNTER — Ambulatory Visit: Payer: BLUE CROSS/BLUE SHIELD | Admitting: Family Medicine

## 2015-10-25 ENCOUNTER — Ambulatory Visit: Payer: BLUE CROSS/BLUE SHIELD | Admitting: Rehabilitative and Restorative Service Providers"

## 2015-10-26 ENCOUNTER — Ambulatory Visit: Payer: BLUE CROSS/BLUE SHIELD | Admitting: Rehabilitative and Restorative Service Providers"

## 2015-10-27 LAB — TOXASSURE SELECT,+ANTIDEPR,UR

## 2015-10-31 ENCOUNTER — Encounter: Payer: Self-pay | Admitting: Physical Therapy

## 2015-10-31 ENCOUNTER — Encounter: Payer: BLUE CROSS/BLUE SHIELD | Admitting: Physical Therapy

## 2015-10-31 ENCOUNTER — Ambulatory Visit: Payer: BLUE CROSS/BLUE SHIELD | Admitting: Physical Therapy

## 2015-10-31 DIAGNOSIS — R2689 Other abnormalities of gait and mobility: Secondary | ICD-10-CM

## 2015-10-31 DIAGNOSIS — M6281 Muscle weakness (generalized): Secondary | ICD-10-CM

## 2015-10-31 DIAGNOSIS — M792 Neuralgia and neuritis, unspecified: Secondary | ICD-10-CM

## 2015-10-31 NOTE — Therapy (Signed)
[place] [facility]IN [facility] [address] [place], [place], [address] Phone: [phone]   Fax:  [phone]  Physical Therapy Treatment  Patient Details  Name: John Hoover MRN: [id number] Date of Birth: [birth year] Referring Provider: Maryla MorrowAnkit Patel, MD  Encounter Date: 10/31/2015      PT End of Session - 10/31/15 1151    Visit Number 6   Number of Visits 18   Date for PT Re-Evaluation 12/11/15   Authorization Type 30 visit limit   PT Start Time 1031   PT Stop Time 1112   PT Time Calculation (min) 41 min   Activity Tolerance Patient tolerated treatment well   Behavior [facility] for [facility] assessed/performed      Past Medical History  Diagnosis Date  . Meniere's disease   . Flu   . Rhabdomyolysis   . Respiratory failure (HCC)     VDRF flu 2017  . Anemia   . Neuropathy (HCC)   . Physical deconditioning   . AKI (acute kidney injury) (HCC)   . Critical illness polyneuropathy [facility](HCC)     Past Surgical History  Procedure Laterality Date  . Nasal sinus surgery      There were no vitals filed for this visit.      Subjective Assessment - 10/31/15 1029    Subjective Patient reports feeling about the same as he did last week. He reports, "I may be able to more my foot a little more, but its not a big difference."    Patient Stated Goals Return to prior level of functioning including running (did 2 miles /day).   Currently in Pain? Yes   Pain Score 7    Pain Location Leg   Pain Orientation Right;Left;Distal   Pain Descriptors / Indicators Tingling   Pain Onset More than a month ago            [facility] - 10/31/15 0001    AROM   Right Ankle Dorsiflexion -14   Right Ankle Eversion 6   Left Ankle Dorsiflexion -12   Left Ankle Eversion 14       TREATMENT: Warm up on Nustep BUE/BLE level 3 x4 min (Unbilled);  Quantum leg press: BLE plate [id number]# [id number];; patient required min VCs to slow down LE movement  especially with eccentric return for better strengthening; BLE heel raises 120# 2x15 with cues to keep knee straight for better ankle movement;  Seated with red tband around both feet: Ankle DF 2x12 bilaterally with min VCs for positioning for better ankle strengthening; Ankle EV x12 bilaterally with mod VCs to avoid hip/knee movement to isolate ankle strengthening;  Incline calf stretch 15 sec hold x2 Rockerboard teeter forward/backward with 2-1 rail assist x2 min with cues to slow down teeter for better ankle strategies;  Standing on 1/2 bolster (flat side up) Heel/toe raise rock 2x10 with cues to keep knees straight for better ankle ROM; Balance with feet apart BUE wand flexion 2x10 with min A for balance;  Educated patient in narrow beam exercise: Side stepping with heel/toe off x2 laps with 1-0 rail assist min A for balance; Tandem gait forward/backward x2 laps; Tandem stance with BUE ball pass x1 rep with mod A for balance; Patient required min VCs for balance stability, including to increase trunk control for less loss of balance with smaller base of support  PT Education - 10/31/15 1151    Education provided Yes   Education Details ankle strengthening, balance exercise   Person(s) Educated Patient   Methods Explanation;Verbal cues   Comprehension Verbalized understanding;Returned demonstration;Verbal cues required          PT Short Term Goals - 10/12/15 1601    PT SHORT TERM GOAL #1   Title The patient will return demo HEP for balance, LE strength (ankles), and general mobility.   Baseline Target date 11/11/2015   Time 4   Period Weeks   PT SHORT TERM GOAL #2   Title The patient will demonstrate correct sequencing of SPC for household mobility to improve safety with device.   Baseline Target date 11/11/2015   Time 4   Period Weeks   PT SHORT TERM GOAL #3   Title The patient will negotiate 4 steps without a handrail modified  indep to demo improved community surface negotiation.   Baseline Target date 11/11/2015   Time 4   Period Weeks   PT SHORT TERM GOAL #4   Title The patient will negotiate level community surfaces wth SPC modified indep x 1000 ft for dec'd dependence on RW.   Baseline Target date 11/11/2015   Time 4   Period Weeks   PT SHORT TERM GOAL #5   Title The patient will improve Berg balance score from 43/56 up to 48/56 to demo dec'd risk for falls.   Baseline Target date 11/11/2015   Time 4   Period Weeks           PT Long Term Goals - 10/12/15 1607    PT LONG TERM GOAL #1   Title The patient will improve ABC scale by 15% (baseline 63%).   Baseline Target date 12/11/2015   Time 8   Period Weeks   PT LONG TERM GOAL #2   Title The patient will imrpove ankle strength to > or equal to 4/5 for improved foot clearance during gait activities.   Baseline Target date 12/11/2015   Time 8   Period Weeks   PT LONG TERM GOAL #3   Title The patient will improve gait speed from 2.20 ft/sec to > or equal to 3.2 ft/sec to demo return to full community ambulation and faster gait speed.   Baseline Target date 12/11/2015   Time 8   Period Weeks   PT LONG TERM GOAL #4   Title The patient will improve Berg from 43/56 to > or equal to 52/56 to demo improved high level balance control.   Baseline Target date 12/11/2015   Time 8   Period Weeks   PT LONG TERM GOAL #5   Title The patient will negotiate level/unlevel community surfaces without a device independently.   Baseline Target date 12/11/2015   Time 8   Period Weeks   Additional Long Term Goals   Additional Long Term Goals Yes   PT LONG TERM GOAL #6   Title The patient will demo slow jog x 100 ft to begin to demo ability to return to prior exercise level.   Baseline Target date 12/11/2015   Time 8   Period Weeks               Plan - 10/31/15 1151    Clinical Impression Statement Instructed patient in advanced LE ankle strengthening. Patient  required increased cues to avoid hip/knee movement in order to isolate ankle ROM. He is most limited in ankle EV and DF. Patient also instructed in  advanced balance exercise. Patient required min-mod A for balance while standing on uneven surfaces. He continues to demonstrate decreased ankle strategies due to weakness in ankles. Patient would benefit from additional skilled PT intervention to improve balance/gait safety and reduce fall risk;    Rehab Potential Good   PT Treatment/Interventions ADLs/Self Care Home Management;Therapeutic exercise;Therapeutic activities;Functional mobility training;Gait training;Patient/family education;Balance training;Neuromuscular re-education   PT Next Visit Plan distal LE strengthening, gait training, balance activities engaging ankles.   PT Home Exercise Plan continue as given;    Consulted and Agree with Plan of Care Patient      Patient will benefit from skilled therapeutic intervention in order to improve the following deficits and impairments:  Abnormal gait, Decreased range of motion, Difficulty walking, Pain, Decreased endurance, Impaired sensation, Decreased strength, Decreased mobility, Decreased balance  Visit Diagnosis: Other abnormalities of gait and mobility  Muscle weakness (generalized)  Neuropathic pain     Problem List Patient Active Problem List   Diagnosis Date Noted  . Critical illness polyneuropathy (HCC) 09/30/2015  . Debility 09/24/2015  . Weakness   . Abnormality of gait   . Pain   . Neuropathic pain   . Anxiety state   . Yeast infection   . Anemia of chronic disease   . Hypokalemia   . Peripheral edema   . ATN (acute tubular necrosis) (HCC)   . Scrotal edema   . Thrombocytopenia (HCC)   . MODS (multiple organ dysfunction syndrome)   . Endotracheally intubated   . Hypovolemic shock (HCC)   . Acute respiratory failure (HCC)   . AKI (acute kidney injury) (HCC)   . Lactic acidosis   . Hypoxia 08/27/2015  .  Rhabdomyolysis 08/27/2015  . Hyperkalemia 08/27/2015  . ARF (acute renal failure) (HCC) 08/27/2015  . Routine general medical examination at a health care facility 08/23/2014  . Advance care planning 08/23/2014  . Meniere disease 04/08/2011  . ALLERGIC RHINITIS, SEASONAL 12/02/2007    Dannielle Baskins 10/31/2015, 11:55 AM  [place] [facility] MAIN [facility] SERVICES [address] [place], [place], [address] Phone: [phone]   Fax:  [phone]  Name: HAMMAD FINKLER MRN: [id number] Date of Birth: 08/09/1965

## 2015-11-01 ENCOUNTER — Ambulatory Visit: Payer: BLUE CROSS/BLUE SHIELD | Admitting: Family Medicine

## 2015-11-01 NOTE — Progress Notes (Signed)
Urine drug screen for this encounter is consistent for prescribed medication 

## 2015-11-02 ENCOUNTER — Encounter: Payer: Self-pay | Admitting: Family Medicine

## 2015-11-02 ENCOUNTER — Encounter: Payer: BLUE CROSS/BLUE SHIELD | Admitting: Physical Therapy

## 2015-11-02 ENCOUNTER — Ambulatory Visit (INDEPENDENT_AMBULATORY_CARE_PROVIDER_SITE_OTHER): Payer: BLUE CROSS/BLUE SHIELD | Admitting: Family Medicine

## 2015-11-02 VITALS — BP 102/72 | HR 101 | Temp 98.3°F | Wt 192.5 lb

## 2015-11-02 DIAGNOSIS — G6281 Critical illness polyneuropathy: Secondary | ICD-10-CM | POA: Diagnosis not present

## 2015-11-02 MED ORDER — AMITRIPTYLINE HCL 25 MG PO TABS: 25.0000 mg | ORAL_TABLET | Freq: Every day | ORAL | Status: DC

## 2015-11-02 NOTE — Progress Notes (Signed)
Pre visit review using our clinic review tool, if applicable. No additional management support is needed unless otherwise documented below in the visit note.  Off metoprolol for about 1 week.  He weaned off the medicine gradually.    Still with chronic neuropathic pain, R>L foot.  Some better in the meantime in the L foot.   He feels sedated the AM after taking amitriptyline, dosed qhs prev with 150mg  then 75mg  qhs more recently.   Still on 600mg  BID gabapentin and oxycodone 5mg  tid.    He has graduated from ankle braces and doesn't have a foot drop.    He was asking about going back to work.  D/w pt about meeting goals that would allow return to work.  Not tearful but anxious about being able to go back to work.    Meds, vitals, and allergies reviewed.   ROS: Per HPI unless specifically indicated in ROS section   nad ncat rrr ctab abd normal BS Dec sensation to light touch on the BLE with superficial necrotic skin on the toes B.  DP pulses 2+ B.  Trace BLE edema.  Able to walk w/o ankle orthotics

## 2015-11-02 NOTE — Patient Instructions (Addendum)
Elavil- try taking 50mg  at night.  If you are drowsy the next day, then gradually cut back to 25mg .   I'll check with your other docs in the meantime about returning to work.  It would be useful for you to make a list of functions/activites you'll need to be able to do for work.  Don't pick the scabs on your feet.  I wouldn't use compression bandages.   No driving yet.  Take care.  Glad to see you.

## 2015-11-04 ENCOUNTER — Other Ambulatory Visit: Payer: Self-pay | Admitting: Physical Medicine and Rehabilitation

## 2015-11-04 NOTE — Assessment & Plan Note (Signed)
The skin on the toes is slowly healing.  He feels sedated the AM after taking amitriptyline, so will taper down to 50mg  then 25mg  if needed qhs.  He'll see how his pain level is at that point.  Continue oxycodone for now.  He'll need to have improved strength and not be on oxycodone so he can drive/get back to work. D/w pt.  I don't know when that will be.  I'll check with his other docs in the meantime.  At this point, still okay for outpatient follow up.   >25 minutes spent in face to face time with patient, >50% spent in counselling or coordination of care.

## 2015-11-05 ENCOUNTER — Telehealth: Payer: Self-pay | Admitting: Physical Medicine & Rehabilitation

## 2015-11-05 ENCOUNTER — Encounter: Payer: Self-pay | Admitting: Physical Therapy

## 2015-11-05 ENCOUNTER — Encounter: Payer: BLUE CROSS/BLUE SHIELD | Admitting: Physical Therapy

## 2015-11-05 ENCOUNTER — Ambulatory Visit: Payer: BLUE CROSS/BLUE SHIELD | Attending: Family Medicine | Admitting: Physical Therapy

## 2015-11-05 DIAGNOSIS — R29818 Other symptoms and signs involving the nervous system: Secondary | ICD-10-CM

## 2015-11-05 DIAGNOSIS — M6281 Muscle weakness (generalized): Secondary | ICD-10-CM | POA: Diagnosis present

## 2015-11-05 DIAGNOSIS — R2689 Other abnormalities of gait and mobility: Secondary | ICD-10-CM | POA: Diagnosis not present

## 2015-11-05 DIAGNOSIS — M792 Neuralgia and neuritis, unspecified: Secondary | ICD-10-CM

## 2015-11-05 NOTE — Telephone Encounter (Signed)
-----   Message from Joaquim NamGraham S Duncan, MD sent at 11/04/2015  9:39 PM EDT ----- Lorain ChildesFYI, had to dec his TCA to 50mg , possibly 25mg  due to sedation.  Any idea when he'll be able to go back to work? Thanks for your help/input.   Clelia CroftShaw
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
## 2015-11-05 NOTE — Telephone Encounter (Signed)
I believe driving is his main limitation.  He can be relatively sedentary in his job.  Once he is off Narcotics, his LE edema can be managed (either decreasing and/or opportunities at work for elevation), and he regains strength, sensation, and wound healing to his lower extremities that would allow for safe driving, he should be cleared to return to work. Thank you.  Ankit Allena KatzPatel

## 2015-11-07 ENCOUNTER — Encounter: Payer: BLUE CROSS/BLUE SHIELD | Admitting: Physical Therapy

## 2015-11-07 ENCOUNTER — Encounter: Payer: Self-pay | Admitting: Physical Therapy

## 2015-11-07 ENCOUNTER — Ambulatory Visit: Payer: BLUE CROSS/BLUE SHIELD | Admitting: Physical Therapy

## 2015-11-07 DIAGNOSIS — R2689 Other abnormalities of gait and mobility: Secondary | ICD-10-CM

## 2015-11-07 DIAGNOSIS — R29818 Other symptoms and signs involving the nervous system: Secondary | ICD-10-CM

## 2015-11-07 DIAGNOSIS — M792 Neuralgia and neuritis, unspecified: Secondary | ICD-10-CM

## 2015-11-07 DIAGNOSIS — M6281 Muscle weakness (generalized): Secondary | ICD-10-CM

## 2015-11-07 NOTE — Therapy (Signed)
[facility] MAIN [facility] SERVICES [address] [place], [place], [address] Phone: [phone]   Fax:  [phone]  Physical Therapy Treatment  Patient Details  Name: John Hoover MRN: [id number] Date of Birth: 1965/07/28 Referring Provider: Maryla Morrow, MD  Encounter Date: 11/07/2015      PT End of Session - 11/07/15 1738    Visit Number 8   Number of Visits 18   Date for PT Re-Evaluation 12/11/15   Authorization Type 30 visit limit   PT Start Time 1115   PT Stop Time 1200   PT Time Calculation (min) 45 min   Equipment Utilized During Treatment Gait belt   Activity Tolerance Patient tolerated treatment well;No increased pain   Behavior During Therapy [facility] for tasks assessed/performed      Past Medical History  Diagnosis Date  . Meniere's disease   . Flu   . Rhabdomyolysis   . Respiratory failure (HCC)     VDRF flu 2017  . Anemia   . Neuropathy (HCC)   . Physical deconditioning   . AKI (acute kidney injury) (HCC)   . 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 Nasal sinus surgery      There were no vitals filed for this visit.      Subjective Assessment - 11/07/15 1735    Subjective Pt reports having similar N/T and pain since last visit with the R foot feeling worse than the L.  He reports trying to cut back on the oxycodone in order to be allowed to drive.  Pt was unable to walk on the treadmill yesterday due to increased pain.     Patient Stated Goals Return to prior level of functioning including running (did 2 miles /day).   Currently in Pain? Yes   Pain Score 2    Pain Location Leg   Pain Orientation Left;Right;Distal   Pain Descriptors / Indicators Tingling   Pain Onset More than a month ago      Treatment    Nustep level 3 for 4 minutes as warm up (untimed code)  Bilateral calf stretch on 8 inch step for 45 seconds  Calf raises with eccentric lowering BLE  with 2 HHA on 8 inch step x 10 reps  Calf raises with eccentric lowering with RLE for 10 reps and 2 HHA, LLE for 10 reps and 2 HHA Lateral eccentric step downs x 10 reps with both RLE and LLE, 2 HHA, mod VCs to keep heel flat on step Resisted DF, 2 sets x 10 reps and eversion, 2 sets x 10 reps, BLEs with red theraband, min tactile cues for increased eversion on R LE  Lunges x 8 with min A for stability and min VCS for technique leading with each LE 3 way cone tapping standing on airex pad with finger tip support and close supervision x 10 reps BLE Ball toss standing on airex balance beam with min A for 2, 1 min rounds  Tandem walking forward (x 2 laps), tandem walking backwards (x 2 laps) with min A for forward and backward Trunk rotations x 10 reps on airex balance beam with min A and mod A to correct one LOB Instructed to increase reps on resisted dorsiflexion/eversion with theraband and continue calf stretches                           PT Education -  11/07/15 1737    Education provided Yes   Education Details HEP, ankle strengthening    Person(s) Educated Patient   Methods Demonstration;Explanation;Verbal cues   Comprehension Verbalized understanding          PT Short Term Goals - 10/12/15 1601    PT SHORT TERM GOAL #1   Title The patient will return demo HEP for balance, LE strength (ankles), and general mobility.   Baseline Target date 11/11/2015   Time 4   Period Weeks   PT SHORT TERM GOAL #2   Title The patient will demonstrate correct sequencing of SPC for household mobility to improve safety with device.   Baseline Target date 11/11/2015   Time 4   Period Weeks   PT SHORT TERM GOAL #3   Title The patient will negotiate 4 steps without a handrail modified indep to demo improved community surface negotiation.   Baseline Target date 11/11/2015   Time 4   Period Weeks   PT SHORT TERM GOAL #4   Title The patient will negotiate level community surfaces wth  SPC modified indep x 1000 ft for dec'd dependence on RW.   Baseline Target date 11/11/2015   Time 4   Period Weeks   PT SHORT TERM GOAL #5   Title The patient will improve Berg balance score from 43/56 up to 48/56 to demo dec'd risk for falls.   Baseline Target date 11/11/2015   Time 4   Period Weeks           PT Long Term Goals - 10/12/15 1607    PT LONG TERM GOAL #1   Title The patient will improve ABC scale by 15% (baseline 63%).   Baseline Target date 12/11/2015   Time 8   Period Weeks   PT LONG TERM GOAL #2   Title The patient will imrpove ankle strength to > or equal to 4/5 for improved foot clearance during gait activities.   Baseline Target date 12/11/2015   Time 8   Period Weeks   PT LONG TERM GOAL #3   Title The patient will improve gait speed from 2.20 ft/sec to > or equal to 3.2 ft/sec to demo return to full community ambulation and faster gait speed.   Baseline Target date 12/11/2015   Time 8   Period Weeks   PT LONG TERM GOAL #4   Title The patient will improve Berg from 43/56 to > or equal to 52/56 to demo improved high level balance control.   Baseline Target date 12/11/2015   Time 8   Period Weeks   PT LONG TERM GOAL #5   Title The patient will negotiate level/unlevel community surfaces without a device independently.   Baseline Target date 12/11/2015   Time 8   Period Weeks   Additional Long Term Goals   Additional Long Term Goals Yes   PT LONG TERM GOAL #6   Title The patient will demo slow jog x 100 ft to begin to demo ability to return to prior exercise level.   Baseline Target date 12/11/2015   Time 8   Period Weeks               Plan - 11/07/15 1738    Clinical Impression Statement Pt responded well to treatment and felt challenged with no increase in pain.  Pt fatigued easily with theraband ankle strengthening and eccentric calf raises.  Pt reported increased difficulty standing on RLE for single leg balance activities and strengthening.  Pt  demonstrated increased ankle strategy with tandem balance activities on airex balance beam.  Pt required CGA to min A for advanced balance training and mod A for one LOB in tandem stance.  Pt would benefit from continued skilled physical therapy to increase ankle strengthening, ROM, and balance in order to complete all functional activities to return to work.     Rehab Potential Good   PT Treatment/Interventions ADLs/Self Care Home Management;Therapeutic exercise;Therapeutic activities;Functional mobility training;Gait training;Patient/family education;Balance training;Neuromuscular re-education   PT Next Visit Plan distal LE strengthening, gait training, balance activities engaging ankles.   PT Home Exercise Plan continue as given;    Consulted and Agree with Plan of Care Patient      Patient will benefit from skilled therapeutic intervention in order to improve the following deficits and impairments:  Abnormal gait, Decreased range of motion, Difficulty walking, Pain, Decreased endurance, Impaired sensation, Decreased strength, Decreased mobility, Decreased balance  Visit Diagnosis: Other abnormalities of gait and mobility  Neuropathic pain  Muscle weakness (generalized)  Other symptoms and signs involving the nervous system     Problem List Patient Active Problem List   Diagnosis Date Noted  . Critical illness polyneuropathy (HCC) 09/30/2015  . Debility 09/24/2015  . Weakness   . Abnormality of gait   . Pain   . Neuropathic pain   . Anxiety state   . Yeast infection   . Anemia of chronic disease   . Hypokalemia   . Peripheral edema   . ATN (acute tubular necrosis) (HCC)   . Scrotal edema   . Thrombocytopenia (HCC)   . MODS (multiple organ dysfunction syndrome)   . Endotracheally intubated   . Hypovolemic shock (HCC)   . Acute respiratory failure (HCC)   . AKI (acute kidney injury) (HCC)   . Lactic acidosis   . Hypoxia 08/27/2015  . Rhabdomyolysis 08/27/2015  .  Hyperkalemia 08/27/2015  . ARF (acute renal failure) (HCC) 08/27/2015  . Routine general medical examination at a health care facility 08/23/2014  . Advance care planning 08/23/2014  . Meniere disease 04/08/2011  . ALLERGIC RHINITIS, SEASONAL 12/02/2007    Trotter,Margaret , PT, DPT  11/07/2015, 5:40 PM  [facility] MAIN [facility] SERVICES [address] [place], [place], [address] Phone: [phone]   Fax:  [phone]  Name: HARSHA YUSKO MRN: [id number] Date of Birth: 1966/01/01

## 2015-11-08 ENCOUNTER — Telehealth: Payer: Self-pay

## 2015-11-08 NOTE — Telephone Encounter (Signed)
Pt left v/m; pt last seen 11/02/15; pt wants to wein off the oxycodone for neuropathy; pt wants to try to go back to work but it seems like oxycodone is the only thing that helps pt's pain. Pt wants to know if there is substitute med for oxycodone that would still help the pain. Pt request cb.

## 2015-11-09 NOTE — Telephone Encounter (Signed)
I would try to take an extra gabapentin daily in the meantime and see if that helps.  If it does, then he may be able to get off the oxycodone.  Thanks.

## 2015-11-09 NOTE — Telephone Encounter (Signed)
Patient says the Amitriptyline is working fine.  He takes 2 tablets of 25 mg (50 mg total)  before bedtime.  Patient takes Gabapentin 600 mg at breakfast and at lunch.  He has already cut back on his Oxycodone to 1 tablet in the morning and 1 tablet around 6 pm but patient says he is still having significant pain unless he just sits and does nothing.  Patient would like to get off pain med so that he can start to drive again and go back to work.

## 2015-11-09 NOTE — Telephone Encounter (Signed)
Thanks

## 2015-11-09 NOTE — Telephone Encounter (Signed)
Patient advised.   Will update us early next week.

## 2015-11-09 NOTE — Telephone Encounter (Signed)
How is he doing on the amitriptyline?  What dose of amitriptyline currently?   How much gabapentin? Let me know and I'll give this some consideration.  Thanks.

## 2015-11-12 ENCOUNTER — Encounter: Payer: BLUE CROSS/BLUE SHIELD | Admitting: Physical Therapy

## 2015-11-13 ENCOUNTER — Ambulatory Visit: Payer: BLUE CROSS/BLUE SHIELD | Admitting: Physical Therapy

## 2015-11-14 ENCOUNTER — Encounter: Payer: BLUE CROSS/BLUE SHIELD | Admitting: Physical Therapy

## 2015-11-15 ENCOUNTER — Ambulatory Visit: Payer: BLUE CROSS/BLUE SHIELD | Admitting: Physical Therapy

## 2015-11-15 ENCOUNTER — Encounter: Payer: Self-pay | Admitting: Physical Therapy

## 2015-11-15 ENCOUNTER — Telehealth: Payer: Self-pay

## 2015-11-15 DIAGNOSIS — M792 Neuralgia and neuritis, unspecified: Secondary | ICD-10-CM

## 2015-11-15 DIAGNOSIS — R29818 Other symptoms and signs involving the nervous system: Secondary | ICD-10-CM

## 2015-11-15 DIAGNOSIS — M6281 Muscle weakness (generalized): Secondary | ICD-10-CM

## 2015-11-15 DIAGNOSIS — R2689 Other abnormalities of gait and mobility: Secondary | ICD-10-CM

## 2015-11-15 MED ORDER — PREGABALIN 75 MG PO CAPS: 75.0000 mg | ORAL_CAPSULE | Freq: Every day | ORAL | Status: DC

## 2015-11-15 NOTE — Telephone Encounter (Signed)
I'll ask for input from Dr. Allena KatzPatel.  Dr. Allena KatzPatel- patient couldn't tolerate high dose of TCA, still with pain with gabapentin use.  It is reasonable to try taper off gabapentin and start lyrica? I would appreciate your input.  Thanks.

## 2015-11-15 NOTE — Telephone Encounter (Signed)
Pt left v/m; pt was seen 11/02/15; pt needs different med for terrible pain due to neuropathy in pts feet. Pt is trying to wein off oxycodone. Pt request cb.

## 2015-11-15 NOTE — Telephone Encounter (Signed)
See below re: discussion with Dr. Allena KatzPatel.  Stop gabapentin. Start lyrica 75mg  po qd, dose based on his renal function.  Sedation caution on med.   Please call in.   Update us as needed.  I'll be out of clinic next week but likely checking EMR periodically.  If not helpful after a 5 doses, he may be able to tolerate 75mg  po BID. Wouldn't increase the dose before 5 days of 75mg  per day to see if initial dose has any effect.   Thanks.

## 2015-11-15 NOTE — Telephone Encounter (Signed)
Left message on patient's voicemail to return call

## 2015-11-15 NOTE — Therapy (Signed)
[facility] MAIN [facility] SERVICES [address] [place], [place], [address] Phone: [phone]   Fax:  [phone]  Physical Therapy Treatment  Patient Details  Name: John Hoover MRN: [id number] Date of Birth: 12/11/1965 Referring Provider: Maryla Morrow, MD  Encounter Date: 11/15/2015      PT End of Session - 11/15/15 1056    Visit Number 9   Number of Visits 18   Date for PT Re-Evaluation 12/11/15   Authorization Type 30 visit limit   PT Start Time 0900   PT Stop Time 0945   PT Time Calculation (min) 45 min   Equipment Utilized During Treatment Gait belt   Activity Tolerance Patient tolerated treatment well;No increased pain   Behavior During Therapy [facility] for tasks assessed/performed      Past Medical History  Diagnosis Date  . Meniere's disease   . Flu   . Rhabdomyolysis   . Respiratory failure (HCC)     VDRF flu 2017  . Anemia   . Neuropathy (HCC)   . Physical deconditioning   . AKI (acute kidney injury) (HCC)   . 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 Nasal sinus surgery      There were no vitals filed for this visit.      Subjective Assessment - 11/15/15 0905    Subjective Pt is back from his beach trip and reports increased soreness and swelling in both feet after being on the beach but has since resolved.   Has reports he has not taken oxycodone since last Tuesday as he is still interested in getting back to driving.    Pertinent History --   Patient Stated Goals Return to prior level of functioning including running (did 2 miles /day).   Currently in Pain? Yes   Pain Score 6    Pain Location Foot   Pain Orientation Right   Pain Onset More than a month ago   Multiple Pain Sites No        Treatment  Nustep x 4 mins on level 3 BLE (unbilled)  Sidestepping with green theraband around ankles x 3 laps, with min VCs to maintain crouched position  Diagonal  sidestepping over balance beam with green theraband around ankles for 3 mins with min VCs to increase ROM  Heel raises 2 sets x 10 reps on half bolster with 1 HHA for stability  Supine AAROM hamstring stretch 2 sets x 30 seconds RLE, min VCs for knee extension, supine AAROM calf stretch 2 sets x 30 seconds RLE  Standing gastroc and soleus stretch for RLE 2 sets x 30 seconds to initiate into HEP  Step ups on bosu ball 2 sets x 10 reps with 2 HHA and close supervision, min VCs for glut activation with upright posture  Marches on bosu ball 10 sets x 4 marches with min A and 2 HHA for stability  Wall squats with theraball support, 2 sets x 10 reps, min VCs for positioning to ensure knees don't go over toes  Leg press 90# x 15 reps BLEs, 60# single leg x 15 reps, min VCs to decrease terminal knee extension   Continuation of HEP with addition of supine calf/hamstring stretch and standing gastroc/solues stretch                          PT Education - 11/15/15 1048    Education provided  Yes   Education Details including calf and hamstring stretching into HEP,    Person(s) Educated Patient   Methods Explanation;Demonstration;Verbal cues   Comprehension Verbalized understanding;Returned demonstration;Verbal cues required          PT Short Term Goals - 10/12/15 1601    PT SHORT TERM GOAL #1   Title The patient will return demo HEP for balance, LE strength (ankles), and general mobility.   Baseline Target date 11/11/2015   Time 4   Period Weeks   PT SHORT TERM GOAL #2   Title The patient will demonstrate correct sequencing of SPC for household mobility to improve safety with device.   Baseline Target date 11/11/2015   Time 4   Period Weeks   PT SHORT TERM GOAL #3   Title The patient will negotiate 4 steps without a handrail modified indep to demo improved community surface negotiation.   Baseline Target date 11/11/2015   Time 4   Period Weeks   PT SHORT TERM GOAL #4    Title The patient will negotiate level community surfaces wth SPC modified indep x 1000 ft for dec'd dependence on RW.   Baseline Target date 11/11/2015   Time 4   Period Weeks   PT SHORT TERM GOAL #5   Title The patient will improve Berg balance score from 43/56 up to 48/56 to demo dec'd risk for falls.   Baseline Target date 11/11/2015   Time 4   Period Weeks           PT Long Term Goals - 10/12/15 1607    PT LONG TERM GOAL #1   Title The patient will improve ABC scale by 15% (baseline 63%).   Baseline Target date 12/11/2015   Time 8   Period Weeks   PT LONG TERM GOAL #2   Title The patient will imrpove ankle strength to > or equal to 4/5 for improved foot clearance during gait activities.   Baseline Target date 12/11/2015   Time 8   Period Weeks   PT LONG TERM GOAL #3   Title The patient will improve gait speed from 2.20 ft/sec to > or equal to 3.2 ft/sec to demo return to full community ambulation and faster gait speed.   Baseline Target date 12/11/2015   Time 8   Period Weeks   PT LONG TERM GOAL #4   Title The patient will improve Berg from 43/56 to > or equal to 52/56 to demo improved high level balance control.   Baseline Target date 12/11/2015   Time 8   Period Weeks   PT LONG TERM GOAL #5   Title The patient will negotiate level/unlevel community surfaces without a device independently.   Baseline Target date 12/11/2015   Time 8   Period Weeks   Additional Long Term Goals   Additional Long Term Goals Yes   PT LONG TERM GOAL #6   Title The patient will demo slow jog x 100 ft to begin to demo ability to return to prior exercise level.   Baseline Target date 12/11/2015   Time 8   Period Weeks               Plan - 11/15/15 1056    Clinical Impression Statement Pt continues to progress his strength and dynamic balance of BLEs. Pt felt challenged with treatment and reported no increase in pain or soreness in either foot.  Pt demonstrated adequate technique and no  LOB with sidways band walking and  diagonal band walking over balance beam with green theraband.  Pt reported increasing difficulty with balance activites on bosu ball and required min A and 1 HHA for stability.  Hamstring and calf stretches were included in patients HEP due to increased complaints of soreness/tightness in calfs and ankles in the morning.  Pt would benefit from skilled physical therapy to continue to increase LE strength and ankle ROM to complete all functional activites required to return to work.    Rehab Potential Good   PT Frequency 2x / week   PT Duration 8 weeks   PT Treatment/Interventions ADLs/Self Care Home Management;Therapeutic exercise;Therapeutic activities;Functional mobility training;Gait training;Patient/family education;Balance training;Neuromuscular re-education   PT Next Visit Plan wall squats with theraball, side lunges on bosu ball, hip abduction exercises, dino disc balance activites    PT Home Exercise Plan added calf and hamstring supine and standing stretches to HEP    Consulted and Agree with Plan of Care Patient      Patient will benefit from skilled therapeutic intervention in order to improve the following deficits and impairments:  Abnormal gait, Decreased range of motion, Difficulty walking, Pain, Decreased endurance, Impaired sensation, Decreased strength, Decreased mobility, Decreased balance  Visit Diagnosis: Other abnormalities of gait and mobility  Neuropathic pain  Muscle weakness (generalized)  Other symptoms and signs involving the nervous system     Problem List Patient Active Problem List   Diagnosis Date Noted  . Critical illness polyneuropathy (HCC) 09/30/2015  . Debility 09/24/2015  . Weakness   . Abnormality of gait   . Pain   . Neuropathic pain   . Anxiety state   . Yeast infection   . Anemia of chronic disease   . Hypokalemia   . Peripheral edema   . ATN (acute tubular necrosis) (HCC)   . Scrotal edema   .  Thrombocytopenia (HCC)   . MODS (multiple organ dysfunction syndrome)   . Endotracheally intubated   . Hypovolemic shock (HCC)   . Acute respiratory failure (HCC)   . AKI (acute kidney injury) (HCC)   . Lactic acidosis   . Hypoxia 08/27/2015  . Rhabdomyolysis 08/27/2015  . Hyperkalemia 08/27/2015  . ARF (acute renal failure) (HCC) 08/27/2015  . Routine general medical examination at a health care facility 08/23/2014  . Advance care planning 08/23/2014  . Meniere disease 04/08/2011  . ALLERGIC RHINITIS, SEASONAL 12/02/2007   Waldon Reining, SPT  This entire session was performed under direct supervision and direction of a licensed therapist/therapist assistant . I have personally read, edited and approve of the note as written.  Trotter,Margaret PT, DPT 11/15/2015, 12:57 PM  [place] [facility] MAIN [facility] SERVICES [address] [place], [place], [address] Phone: [phone]   Fax:  [phone]  Name: John Hoover MRN: [id number] Date of Birth: Nov 29, 1965

## 2015-11-15 NOTE — Telephone Encounter (Signed)
That's unfortunate, when I saw him last, he appeared to be doing better.   I'm not sure what dose of Gabapentin he is on, but he may be able to be transitioned from Gabapentin to Lyrica.  He does not need to be tapered off one and then started on the other.  He can be transitioned. Thanks.

## 2015-11-16 ENCOUNTER — Encounter: Payer: Self-pay | Admitting: Family Medicine

## 2015-11-16 ENCOUNTER — Encounter: Payer: Self-pay | Admitting: Physical Therapy

## 2015-11-16 ENCOUNTER — Ambulatory Visit (INDEPENDENT_AMBULATORY_CARE_PROVIDER_SITE_OTHER): Payer: BLUE CROSS/BLUE SHIELD | Admitting: Family Medicine

## 2015-11-16 ENCOUNTER — Ambulatory Visit: Payer: BLUE CROSS/BLUE SHIELD

## 2015-11-16 VITALS — BP 124/84 | HR 92 | Temp 97.7°F | Wt 200.2 lb

## 2015-11-16 DIAGNOSIS — M6281 Muscle weakness (generalized): Secondary | ICD-10-CM

## 2015-11-16 DIAGNOSIS — R2689 Other abnormalities of gait and mobility: Secondary | ICD-10-CM | POA: Diagnosis not present

## 2015-11-16 DIAGNOSIS — G6281 Critical illness polyneuropathy: Secondary | ICD-10-CM

## 2015-11-16 MED ORDER — AMITRIPTYLINE HCL 25 MG PO TABS: 75.0000 mg | ORAL_TABLET | Freq: Every day | ORAL | Status: DC

## 2015-11-16 NOTE — Progress Notes (Signed)
Pre visit review using our clinic review tool, if applicable. No additional management support is needed unless otherwise documented below in the visit note.  Improved dorsiflexion in the feet, still with neuropathic pain, trying to wean off oxycodone.  Current meds- gabapentin- not as effective.  D/w pt.  See prev phone note.  Skin on L foot healed, but residual dead tissue on the R toes noted, though has been sloughing off. D/w pt about his eventual return to work.  We'll need to get him up and moving well with no ADE from his meds, with new rx of lyrica pending.  D/w pt.  He agrees.  We discussed his case in specific and neuropathy in general.   Meds, vitals, and allergies reviewed.   ROS: Per HPI unless specifically indicated in ROS section   GEN: nad, alert and oriented HEENT: mucous membranes moist NECK: supple w/o LA CV: rrr PULM: ctab, no inc wob ABD: soft, +bs EXT: no edema SKIN: no acute rash but necrotic skin on R 2nd and 4th toes noted, trimmed back w/o complication.  No sign of infection on the BLE.  Improved B foot dorsiflexion compared to prev.

## 2015-11-16 NOTE — Patient Instructions (Signed)
Try lyrica once a day for about 5 days, then twice a day.  Sedation caution.  Update me as needed.  Let the remaining black scabs flake off.  Take care.  Glad to see you.

## 2015-11-16 NOTE — Telephone Encounter (Signed)
Patient advised but wants to speak with Dr. Para Marchuncan.  Appointment scheduled today.

## 2015-11-16 NOTE — Therapy (Signed)
[place] [facility] MAIN [facility] SERVICES [address] [place], [place], [address] Phone: [phone]   Fax:  [phone]  Physical Therapy Treatment  Patient Details  Name: John Hoover MRN: [id number] Date of Birth: Jul 20, 1965 Referring Provider: Maryla Morrow, MD  Encounter Date: 11/16/2015      PT End of Session - 11/16/15 1024    Visit Number 10   Number of Visits 18   Date for PT Re-Evaluation 12/11/15   Authorization Type 30 visit limit   PT Start Time 0845   PT Stop Time 0929   PT Time Calculation (min) 44 min   Equipment Utilized During Treatment Gait belt   Activity Tolerance Patient tolerated treatment well;No increased pain   Behavior During Therapy [facility] for tasks assessed/performed      Past Medical History  Diagnosis Date  . Meniere's disease   . Flu   . Rhabdomyolysis   . Respiratory failure (HCC)     VDRF flu 2017  . Anemia   . Neuropathy (HCC)   . Physical deconditioning   . AKI (acute kidney injury) (HCC)   . 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 Nasal sinus surgery      There were no vitals filed for this visit.      Subjective Assessment - 11/16/15 0852    Subjective Pt reports similar N/T in bilateral feet, which is not different than usual.  Pt reports mild soreness in his calves since yeseterdays treatment.     Limitations Walking   Patient Stated Goals Return to prior level of functioning including running (did 2 miles /day).   Currently in Pain? No/denies   Pain Onset --       Treatment   Nustep x 4 mins BLE level 3 (unbilled)  Wall squats with therapy ball support 3 sets x 10 reps, min VCs to ensure correct posture  Lateral sidestepping with green theraband x 2 laps, min vcs to maintain crouched posture  Diagonal sidestepping over balance beam x 2 laps forward, 2 laps backwards, CGA for backwards walking, min VCs for increased step width   Tandem stance 2 sets x 10 reps with shoulder flexion using 3lb wand, CGA  Bosu step ups 2 sets x 10 reps alternating lead leg, HHA with parallel bar, CGA from therapist, min VCs for controlled eccentric lowering  Cone taps anteriorly/laterally/posteriorly standing on blue airex pad, 10 reps BLEs, CGA to maintain stability  Quantrum leg press 120# BLE for 10 reps, 60# for 15 reps of heel raises   Reinstructed in HEP and added sidelying quad stretch                            PT Education - 11/16/15 1023    Education provided Yes   Education Details contiuation of HEP with inclusion of sidelying quad stretch, recommended not doing exercises on therapy days due to soreness    Person(s) Educated Patient   Methods Explanation;Verbal cues;Demonstration   Comprehension Verbalized understanding;Returned demonstration          PT Short Term Goals - 10/12/15 1601    PT SHORT TERM GOAL #1   Title The patient will return demo HEP for balance, LE strength (ankles), and general mobility.   Baseline Target date 11/11/2015   Time 4   Period Weeks   PT SHORT TERM GOAL #2   Title The  patient will demonstrate correct sequencing of SPC for household mobility to improve safety with device.   Baseline Target date 11/11/2015   Time 4   Period Weeks   PT SHORT TERM GOAL #3   Title The patient will negotiate 4 steps without a handrail modified indep to demo improved community surface negotiation.   Baseline Target date 11/11/2015   Time 4   Period Weeks   PT SHORT TERM GOAL #4   Title The patient will negotiate level community surfaces wth SPC modified indep x 1000 ft for dec'd dependence on RW.   Baseline Target date 11/11/2015   Time 4   Period Weeks   PT SHORT TERM GOAL #5   Title The patient will improve Berg balance score from 43/56 up to 48/56 to demo dec'd risk for falls.   Baseline Target date 11/11/2015   Time 4   Period Weeks           PT Long Term Goals -  10/12/15 1607    PT LONG TERM GOAL #1   Title The patient will improve ABC scale by 15% (baseline 63%).   Baseline Target date 12/11/2015   Time 8   Period Weeks   PT LONG TERM GOAL #2   Title The patient will imrpove ankle strength to > or equal to 4/5 for improved foot clearance during gait activities.   Baseline Target date 12/11/2015   Time 8   Period Weeks   PT LONG TERM GOAL #3   Title The patient will improve gait speed from 2.20 ft/sec to > or equal to 3.2 ft/sec to demo return to full community ambulation and faster gait speed.   Baseline Target date 12/11/2015   Time 8   Period Weeks   PT LONG TERM GOAL #4   Title The patient will improve Berg from 43/56 to > or equal to 52/56 to demo improved high level balance control.   Baseline Target date 12/11/2015   Time 8   Period Weeks   PT LONG TERM GOAL #5   Title The patient will negotiate level/unlevel community surfaces without a device independently.   Baseline Target date 12/11/2015   Time 8   Period Weeks   Additional Long Term Goals   Additional Long Term Goals Yes   PT LONG TERM GOAL #6   Title The patient will demo slow jog x 100 ft to begin to demo ability to return to prior exercise level.   Baseline Target date 12/11/2015   Time 8   Period Weeks               Plan - 11/16/15 1024    Clinical Impression Statement Pt continues to progress in his ability to tolerate dynamic balance activites.  He continues to work hard as he is motivated to return to driving and return to work.  Pt required CGA for diagnoal walking with resistance and progressed to backwards walking which he was unable to do previously.  He continues to report difficulty with standing activites on Bosu ball but was able to progress to only one HHA.  Sidelying quad stretch was added to his HEP due to soreness reported in R quad.  Pt would continue to benefit from skilled physical therapy to continue to increase BLE strength and balance through ankle  proprioception activites to complete all functional activites required for return to work.     Rehab Potential Good   PT Frequency 2x / week   PT  Duration 8 weeks   PT Treatment/Interventions ADLs/Self Care Home Management;Therapeutic exercise;Therapeutic activities;Functional mobility training;Gait training;Patient/family education;Balance training;Neuromuscular re-education   PT Next Visit Plan lunges on bosu, hip abduction, reasses goals, dino discs    PT Home Exercise Plan continuation of HEP wth addition of sidelying AAROM quad stretch    Consulted and Agree with Plan of Care Patient      Patient will benefit from skilled therapeutic intervention in order to improve the following deficits and impairments:  Abnormal gait, Decreased range of motion, Difficulty walking, Pain, Decreased endurance, Impaired sensation, Decreased strength, Decreased mobility, Decreased balance  Visit Diagnosis: Other abnormalities of gait and mobility  Muscle weakness (generalized)     Problem List Patient Active Problem List   Diagnosis Date Noted  . Critical illness polyneuropathy (HCC) 09/30/2015  . Debility 09/24/2015  . Weakness   . Abnormality of gait   . Pain   . Neuropathic pain   . Anxiety state   . Yeast infection   . Anemia of chronic disease   . Hypokalemia   . Peripheral edema   . ATN (acute tubular necrosis) (HCC)   . Scrotal edema   . Thrombocytopenia (HCC)   . MODS (multiple organ dysfunction syndrome)   . Endotracheally intubated   . Hypovolemic shock (HCC)   . Acute respiratory failure (HCC)   . AKI (acute kidney injury) (HCC)   . Lactic acidosis   . Hypoxia 08/27/2015  . Rhabdomyolysis 08/27/2015  . Hyperkalemia 08/27/2015  . ARF (acute renal failure) (HCC) 08/27/2015  . Routine general medical examination at a health care facility 08/23/2014  . Advance care planning 08/23/2014  . Meniere disease 04/08/2011  . ALLERGIC RHINITIS, SEASONAL 12/02/2007   This entire  session was performed under direct supervision and direction of a licensed therapist/therapist assistant . I have personally read, edited and approve of the note as written.   Waldon Reining, SPT  Huprich,Jason DPT 11/16/2015, 11:31 AM   [facility] MAIN [facility] SERVICES [address] [place], [place], [address] Phone: [phone]   Fax:  [phone]  Name: John Hoover MRN: [id number] Date of Birth: [birth year]

## 2015-11-18 NOTE — Assessment & Plan Note (Signed)
Not yet released to work.  D/w pt.  Will try lyrica once a day for about 5 days, then twice a day.  Sedation caution.  Update me as needed.  Advised him to let the remaining black scabs flake off.  He agrees.  >25 minutes spent in face to face time with patient, >50% spent in counselling or coordination of care.

## 2015-11-19 ENCOUNTER — Telehealth: Payer: Self-pay | Admitting: *Deleted

## 2015-11-19 ENCOUNTER — Encounter: Payer: BLUE CROSS/BLUE SHIELD | Admitting: Physical Therapy

## 2015-11-19 NOTE — Telephone Encounter (Signed)
I recommend giving the Lyrica additional time at this point. I am working to get in touch with pain management at the moment and we'll have more information as soon as possible.

## 2015-11-19 NOTE — Telephone Encounter (Signed)
When I advise pt of Kate's comments pt said that he really doesn't want to take the oxycodone because he needs something that is going to help his pain with out making him so sedated that he can't drive or work, he asked if he should give the Lyrica some more time to get in his system since he just started it Friday or should we try something different, but if we try something different pt wants to be able to still drive and work on new med

## 2015-11-19 NOTE — Telephone Encounter (Signed)
There are other topical treatments he can try, if this is still the same neuropathic pain, such as capsaicin.  He is more than welcome to come back in the office to see me if he would like to.  Thanks.

## 2015-11-19 NOTE — Telephone Encounter (Signed)
Patient called stating that Dr. Para Marchuncan switched him to Lyrica and he has been taking it since Friday. Patient stated that he has not gotten any relief from his pain taking Lyrica. Patient stated that he had to go back to his Oxycodone to get any relief. Patient stated that he is in worse shape since starting the Lyrica. Please advise.

## 2015-11-19 NOTE — Telephone Encounter (Signed)
Looks like he's tried TCA's, gabapentin, and now on Lyrica without any relief. Dr. Allena KatzPatel, do have any other suggestions for treatment? Dr. Para Marchuncan is out of the office this week and I noticed he has consulted you in the past. I appreciate any input you may be able to provide.  Please notify patient that Dr. Para Marchuncan is out of the office and I am getting in touch with pain management. He is to continue his oxycodone as needed for now.

## 2015-11-20 ENCOUNTER — Encounter: Payer: Self-pay | Admitting: Physical Therapy

## 2015-11-20 ENCOUNTER — Ambulatory Visit: Payer: BLUE CROSS/BLUE SHIELD | Admitting: Physical Therapy

## 2015-11-20 DIAGNOSIS — R2689 Other abnormalities of gait and mobility: Secondary | ICD-10-CM

## 2015-11-20 DIAGNOSIS — M792 Neuralgia and neuritis, unspecified: Secondary | ICD-10-CM

## 2015-11-20 DIAGNOSIS — M6281 Muscle weakness (generalized): Secondary | ICD-10-CM

## 2015-11-20 NOTE — Therapy (Signed)
[place] MAIN [facility] SERVICES [address] [place], [place], [address] Phone: [phone]   Fax:  [phone]  Physical Therapy Treatment/Progress Note   Patient Details  Name: MCLAIN FREER MRN: [id number] Date of Birth: [birth year] Referring Provider: Delice Lesch, MD  Encounter Date: 11/20/2015      PT End of Session - 11/20/15 1059    Visit Number 11   Number of Visits 18   Date for PT Re-Evaluation 12/11/15   Authorization Type 30 visit limit   PT Start Time 0900   PT Stop Time 0945   PT Time Calculation (min) 45 min   Equipment Utilized During Treatment Gait belt   Activity Tolerance Patient tolerated treatment well;No increased pain   Behavior During Therapy [facility] for tasks assessed/performed      Past Medical History  Diagnosis Date  . Meniere's disease   . Flu   . Rhabdomyolysis   . Respiratory failure ([place])     VDRF flu 2017  . Anemia   . Neuropathy ([place])   . Physical deconditioning   . AKI (acute kidney injury) ([place])   . 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 Nasal sinus surgery      There were no vitals filed for this visit.      Subjective Assessment - 11/20/15 1056    Subjective Pt reports bilateral foot pain at 6/10, he recently switched to Lyrica which he doesnt believe is changing his pain.  Since the weekend,  he has taken an oxycodone for pain relief which he is trying to wean off    Limitations Walking   Patient Stated Goals Return to prior level of functioning including running (did 2 miles /day).   Currently in Pain? Yes   Pain Score 6    Pain Location Foot   Pain Orientation Left;Right   Pain Descriptors / Indicators Nagging;Tingling   Pain Type Acute pain   Pain Onset More than a month ago   Pain Relieving Factors oxycodone    Multiple Pain Sites No            [facility] PT Assessment - 11/20/15 0001    Observation/Other Assessments    Other Surveys  Other Surveys   Activities of Balance Confidence Scale (ABC Scale)  77.5%, improved since 10/12/15 at 63.1%  less than 80% =limited with activites   Sensation   Light Touch Impaired Detail   Light Touch Impaired Details Impaired LLE;Impaired RLE   Additional Comments light touch grossly and inconsistenly impaired in sock distribution on bilateral feet, pt reports some numbness and swelling in billateral feet    Coordination   Gross Motor Movements are Fluid and Coordinated Yes   Fine Motor Movements are Fluid and Coordinated Yes   ROM / Strength   AROM / PROM / Strength AROM   AROM   Right Ankle Dorsiflexion -7   Right Ankle Eversion 10   Left Ankle Dorsiflexion -8   Left Ankle Eversion 19   Strength   Right Hip Flexion 4+/5   Left Hip Flexion 4+/5   Right Knee Flexion 4+/5   Right Knee Extension 5/5   Left Knee Flexion 4+/5   Left Knee Extension 5/5   Right Ankle Dorsiflexion 4/5   Left Ankle Dorsiflexion 4/5   Standardized Balance Assessment   10 Meter Walk 1.31ms or 4.620fs indicating full community ambulator, compared to 2.90f590f on 5'/12/17   [facility]  Test   Sit to Stand Able to stand without using hands and stabilize independently   Standing Unsupported Able to stand safely 2 minutes   Sitting with Back Unsupported but Feet Supported on Floor or Stool Able to sit safely and securely 2 minutes   Stand to Sit Sits safely with minimal use of hands   Transfers Able to transfer safely, minor use of hands   Standing Unsupported with Eyes Closed Able to stand 10 seconds safely   Standing Ubsupported with Feet Together Able to place feet together independently and stand 1 minute safely   From Standing, Reach Forward with Outstretched Arm Can reach confidently >25 cm (10")   From Standing Position, Pick up Object from Floor Able to pick up shoe safely and easily   From Standing Position, Turn to Look Behind Over each Shoulder Looks behind from both sides and  weight shifts well   Turn 360 Degrees Able to turn 360 degrees safely in 4 seconds or less   Standing Unsupported, Alternately Place Feet on Step/Stool Able to stand independently and safely and complete 8 steps in 20 seconds   Standing Unsupported, One Foot in Front Able to plae foot ahead of the other independently and hold 30 seconds   Standing on One Leg Able to lift leg independently and hold 5-10 seconds   Total Score 54   Berg comment: 54/56 indicating low fall risk, compared to 43/56 on 10/12/15       Treatment  Reassessed ankle ROM and gross LE strength  BERG balance test-54/56 with difficulty in tandem stance and single leg stance  55mwalk test- 1.4110m or 4.6811f  ABC self report-77.5% confident  with functional tasks without loosing balance    Step ups on aerobic step with 2 risers leading with LLE and RLE x 10 reps, no HHA, min VCs to ensure foot clearance  Lateral adduction step ups on aerobic step with 2 risers, leading with LLE and RLE x 10 reps, CGA for balance and stability  Supine glut bridges with red theraband resistance proximal to knees, 2 sets x 10 reps, initiated into HEP, min VCs to increase hip abduction  Dorsiflexion self mobilization with 2 HHA x 10 reps BLEs initaited into HEP for pain relief and mobility, min VCs to keep front heel down   Assessed goals and progress with pt and purpose of continuation of therapy Reinforced HEP and added supine glut bridges                    PT Education - 11/20/15 1058    Education provided Yes   Education Details stretches into HEP, added supine bridges, increasing treadmill walking speed weekly to work towards goal of light jog    Person(s) Educated Patient   Methods Explanation;Demonstration;Verbal cues   Comprehension Verbalized understanding;Returned demonstration          PT Short Term Goals - 11/20/15 1400    PT SHORT TERM GOAL #1   Title The patient will return demo HEP for balance, LE  strength (ankles), and general mobility.   Baseline Target date 11/11/2015   Time 4   Period Weeks   Status On-going   PT SHORT TERM GOAL #2   Title The patient will demonstrate correct sequencing of SPC for household mobility to improve safety with device.   Baseline Target date 11/11/2015   Time 4   Period Weeks   Status Achieved   PT SHORT TERM GOAL #3  Title The patient will negotiate 4 steps without a handrail modified indep to demo improved community surface negotiation.   Baseline Target date 11/11/2015   Time 4   Period Weeks   Status Achieved   PT SHORT TERM GOAL #4   Title The patient will negotiate level community surfaces wth SPC modified indep x 1000 ft for dec'd dependence on RW.   Baseline Target date 11/11/2015   Time 4   Period Weeks   Status Achieved   PT SHORT TERM GOAL #5   Title The patient will improve Berg balance score from 43/56 up to 48/56 to demo dec'd risk for falls.   Baseline Target date 11/11/2015   Time 4   Period Weeks   Status Achieved           PT Long Term Goals - 11/20/15 1110    PT LONG TERM GOAL #1   Title The patient will improve ABC scale by 15% (baseline 63%).   Baseline Target date 12/11/2015   Time 8   Period Weeks   Status Achieved   PT LONG TERM GOAL #2   Title The patient will imrpove ankle strength to > or equal to 4/5 for improved foot clearance during gait activities.   Baseline Target date 12/11/2015   Time 8   Period Weeks   Status Achieved   PT LONG TERM GOAL #3   Title The patient will improve gait speed from 2.20 ft/sec to > or equal to 3.2 ft/sec to demo return to full community ambulation and faster gait speed.   Baseline Target date 12/11/2015   Time 8   Period Weeks   Status Achieved   PT LONG TERM GOAL #4   Title The patient will improve Berg from 43/56 to > or equal to 52/56 to demo improved high level balance control.   Baseline Target date 12/11/2015   Time 8   Period Weeks   Status Achieved   PT LONG  TERM GOAL #5   Title The patient will negotiate level/unlevel community surfaces without a device independently.   Baseline Target date 12/11/2015   Time 8   Period Weeks   Status Achieved   PT LONG TERM GOAL #6   Title The patient will demo slow jog x 100 ft to begin to demo ability to return to prior exercise level.   Baseline Target date 12/11/2015   Time 8   Period Weeks   Status Not Met               Plan - 11/20/15 1110    Clinical Impression Statement Goals were reevaluated today and pt met all goals with exception of goal to perform light jog.  Pt increased his gait speed to 4.62f/s, increased his ABC to 77.5%, increased his BERG score to 54/56, and increased bilateral ankle strength to 4/5 grossly.  All of which reduce his fall risk and increase his ability to ambulate community distances. Pt continues to be challenged with dynamic balance activites and higher level balance such as standing on one foot.  His dorsiflexion ROM has increased grossly but is still limited.  Pt continues to report pain in bilateral ankles that limits his activity and contributes to his fatigue.  He would continue to benefit from additional skilled physical thearpy to work towards his goal of jogging and higher level balance activites to perform all functional activites to return to work.     Rehab Potential Good   PT Frequency 2x /  week   PT Duration 8 weeks   PT Treatment/Interventions ADLs/Self Care Home Management;Therapeutic exercise;Therapeutic activities;Functional mobility training;Gait training;Patient/family education;Balance training;Neuromuscular re-education   PT Next Visit Plan step ups on aerobic steps, lateral step downs, wall squats, balance on dino discs, assess need for dorsiflexion mobilization    PT Home Exercise Plan continued HEP with addition of supine bridges with theraband    Consulted and Agree with Plan of Care Patient      Patient will benefit from skilled therapeutic  intervention in order to improve the following deficits and impairments:  Abnormal gait, Decreased range of motion, Difficulty walking, Pain, Decreased endurance, Impaired sensation, Decreased strength, Decreased mobility, Decreased balance  Visit Diagnosis: Muscle weakness (generalized)  Neuropathic pain  Other abnormalities of gait and mobility     Problem List Patient Active Problem List   Diagnosis Date Noted  . Critical illness polyneuropathy ([place]) 09/30/2015  . Debility 09/24/2015  . Weakness   . Abnormality of gait   . Pain   . Neuropathic pain   . Anxiety state   . Yeast infection   . Anemia of chronic disease   . Hypokalemia   . Peripheral edema   . ATN (acute tubular necrosis) ([place])   . Scrotal edema   . Thrombocytopenia ([place])   . MODS (multiple organ dysfunction syndrome)   . Endotracheally intubated   . Hypovolemic shock ([place])   . Acute respiratory failure ([place])   . AKI (acute kidney injury) ([place])   . Lactic acidosis   . Hypoxia 08/27/2015  . Rhabdomyolysis 08/27/2015  . Hyperkalemia 08/27/2015  . ARF (acute renal failure) ([place]) 08/27/2015  . Routine general medical examination at a health care facility 08/23/2014  . Advance care planning 08/23/2014  . Meniere disease 04/08/2011  . ALLERGIC RHINITIS, SEASONAL 12/02/2007   Stacy Gardner, SPT  This entire session was performed under direct supervision and direction of a licensed therapist/therapist assistant . I have personally read, edited and approve of the note as written.  Trotter,Margaret PT, DPT 11/20/2015, 2:51 PM  [place] MAIN [facility] SERVICES [address] [place], [place], [address] Phone: [phone]   Fax:  [phone]  Name: AJAY STRUBEL MRN: [id number] Date of Birth: 05-17-1966

## 2015-11-20 NOTE — Telephone Encounter (Signed)
Attempted to call patient. Mailbox was full and could not leave message. Will try again later.  

## 2015-11-20 NOTE — Telephone Encounter (Signed)
Patient notified and verbalized understanding. 

## 2015-11-20 NOTE — Telephone Encounter (Signed)
I agree with capsaicin, the pain clinic, etc as below.  If he had no relief but no ADE on the lyrica, then it would be reasonable to try the lyrica BID in the meantime.  Thanks.

## 2015-11-20 NOTE — Telephone Encounter (Signed)
Pt left v/m requesting cb. 

## 2015-11-20 NOTE — Telephone Encounter (Signed)
Attempted to call patient. Mailbox was full and could not leave message. Will try again later.

## 2015-11-20 NOTE — Telephone Encounter (Signed)
Noted and appreciate the input.  Please notify patient that he may try applying Capsaicin to the site of discomfort as he continues with the lyrica. He is also welcome to schedule a follow up appointment with pain management for further evaluation.  Routed to PCP as FYI.

## 2015-11-21 ENCOUNTER — Encounter: Payer: BLUE CROSS/BLUE SHIELD | Admitting: Physical Therapy

## 2015-11-21 ENCOUNTER — Encounter: Payer: Self-pay | Admitting: Physical Therapy

## 2015-11-21 ENCOUNTER — Ambulatory Visit: Payer: BLUE CROSS/BLUE SHIELD | Admitting: Physical Therapy

## 2015-11-21 DIAGNOSIS — R2689 Other abnormalities of gait and mobility: Secondary | ICD-10-CM | POA: Diagnosis not present

## 2015-11-21 DIAGNOSIS — M6281 Muscle weakness (generalized): Secondary | ICD-10-CM

## 2015-11-21 DIAGNOSIS — M792 Neuralgia and neuritis, unspecified: Secondary | ICD-10-CM

## 2015-11-21 NOTE — Therapy (Signed)
[place] MAIN [facility] SERVICES [address] [place], [place], [address] Phone: [phone]   Fax:  [phone]  Physical Therapy Treatment  Patient Details  Name: John Hoover MRN: [id number] Date of Birth: [birth year] Referring Provider: Delice Lesch, MD  Encounter Date: 11/21/2015      PT End of Session - 11/21/15 1655    Visit Number 12   Number of Visits 18   Date for PT Re-Evaluation 12/11/15   Authorization Type 30 visit limit   PT Start Time 1600   PT Stop Time 5364   PT Time Calculation (min) 44 min   Equipment Utilized During Treatment Gait belt   Activity Tolerance Patient tolerated treatment well;No increased pain   Behavior During Therapy [place] for tasks assessed/performed      Past Medical History  Diagnosis Date  . Meniere's disease   . Flu   . Rhabdomyolysis   . Respiratory failure ([place])     VDRF flu 2017  . Anemia   . Neuropathy ([place])   . Physical deconditioning   . AKI (acute kidney injury) ([place])   . Critical illness polyneuropathy [facility])     Past Surgical History  Procedure Laterality Date  . Nasal sinus surgery      There were no vitals filed for this visit.      Subjective Assessment - 11/21/15 1651    Subjective Pt reports calf soreness and similar burning sensation in bilateral feet reporting 6/10 pain.  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    Limitations Walking   Patient Stated Goals Return to prior level of functioning including running (did 2 miles /day).   Currently in Pain? Yes   Pain Score 6    Pain Location Ankle   Pain Orientation Right;Left   Pain Descriptors / Indicators Burning   Pain Onset More than a month ago       Treatment   Dorsiflexion AP mobilization in supine to BLEs followed by PROM to ankle dorsiflexors for greater ankle mobility prior to therapeutic ex. Performed grade 3 AP mobs with 6 bouts of 30 seconds to each LE.  Pt reported feeling "looser in the ankles  after"  (13 mins)  Supine bridges with red theraband resistance and emphasis on hip abduction, 2 sets x 10 reps, min VCs to increase distance between knees at top of range  Supine hamstring curls on therapy ball, 2 sets x 10 reps, min VCs to maintain glut activation and bridge during curl Lateral step downs on 4 inch step with 1 HHA, 1 set x 10 reps BLEs, min VCS for hip extension and posture  Calf raises on 4 inch step with eccentric single leg step down, 1 set x 10 reps BLEs, min VCs to increase dorsiflexion ROM  Single leg ball toss against wall x 3 mins with 15 second bouts, pt required min A to maintain balance standing on RLE  Ladder drills including forward/backward stepping and abduction/adduction stepping, 2 laps for each, min A for one LOB and min VCs to increase speed  Step ups on aerobic step with 2 risers, 10 reps leading with each LE, no HHA Adduction step ups on aerobic step with 2 risers, 10 reps leading with each LE, no HHA, pt required min VCs to increase foot clearance   Reinforced HEP and importance of stretches with orientation to college tomorrow  PT Education - 11/21/15 1654    Education provided Yes   Education Details continuation of HEP and stretches, ensuring stretching and rest before attending orientation on colllege campus tomorrow    Person(s) Educated Patient   Methods Explanation;Demonstration;Verbal cues   Comprehension Verbalized understanding;Returned demonstration;Verbal cues required          PT Short Term Goals - 11/20/15 1400    PT SHORT TERM GOAL #1   Title The patient will return demo HEP for balance, LE strength (ankles), and general mobility.   Baseline Target date 11/11/2015   Time 4   Period Weeks   Status On-going   PT SHORT TERM GOAL #2   Title The patient will demonstrate correct sequencing of SPC for household mobility to improve safety with device.   Baseline Target date 11/11/2015   Time  4   Period Weeks   Status Achieved   PT SHORT TERM GOAL #3   Title The patient will negotiate 4 steps without a handrail modified indep to demo improved community surface negotiation.   Baseline Target date 11/11/2015   Time 4   Period Weeks   Status Achieved   PT SHORT TERM GOAL #4   Title The patient will negotiate level community surfaces wth SPC modified indep x 1000 ft for dec'd dependence on RW.   Baseline Target date 11/11/2015   Time 4   Period Weeks   Status Achieved   PT SHORT TERM GOAL #5   Title The patient will improve Berg balance score from 43/56 up to 48/56 to demo dec'd risk for falls.   Baseline Target date 11/11/2015   Time 4   Period Weeks   Status Achieved           PT Long Term Goals - 11/20/15 1110    PT LONG TERM GOAL #1   Title The patient will improve ABC scale by 15% (baseline 63%).   Baseline Target date 12/11/2015   Time 8   Period Weeks   Status Achieved   PT LONG TERM GOAL #2   Title The patient will imrpove ankle strength to > or equal to 4/5 for improved foot clearance during gait activities.   Baseline Target date 12/11/2015   Time 8   Period Weeks   Status Achieved   PT LONG TERM GOAL #3   Title The patient will improve gait speed from 2.20 ft/sec to > or equal to 3.2 ft/sec to demo return to full community ambulation and faster gait speed.   Baseline Target date 12/11/2015   Time 8   Period Weeks   Status Achieved   PT LONG TERM GOAL #4   Title The patient will improve Berg from 43/56 to > or equal to 52/56 to demo improved high level balance control.   Baseline Target date 12/11/2015   Time 8   Period Weeks   Status Achieved   PT LONG TERM GOAL #5   Title The patient will negotiate level/unlevel community surfaces without a device independently.   Baseline Target date 12/11/2015   Time 8   Period Weeks   Status Achieved   PT LONG TERM GOAL #6   Title The patient will demo slow jog x 100 ft to begin to demo ability to return to  prior exercise level.   Baseline Target date 12/11/2015   Time 8   Period Weeks   Status Not Met  Plan - 11/21/15 1656    Clinical Impression Statement Pt continues to report similar burning sensation in his bilateral feet. He notes that his PCP is on vacation this week and he isnt able to reevaluate his pain medication.  Pt reported bilateral ankles feeling "looser" after supine dorsiflexion mobilizations and manual stretching.  Pt demonstrated difficulty with single leg balance ball toss, more so on his RLE than LLE with several LOB that were corrected by min A from therapist.  Hamstring ball curls were initated to progress his hip strength and adduction step ups on aerobic step with 2 risers challenged him but performance improved since previous session.  Reemphasized the importance of stretching and rest on therapy days.  He would continue to benefit from skilled physical therapy to increase BLE strength and ankle stability to promote greater functional mobility.     Rehab Potential Good   PT Frequency 2x / week   PT Duration 8 weeks   PT Treatment/Interventions ADLs/Self Care Home Management;Therapeutic exercise;Therapeutic activities;Functional mobility training;Gait training;Patient/family education;Balance training;Neuromuscular re-education   PT Next Visit Plan balance on dino discs with chest press, single leg stance, lunges on bosu ball, hamstring curls on theraball    PT Home Exercise Plan continuation of HEP    Consulted and Agree with Plan of Care Patient      Patient will benefit from skilled therapeutic intervention in order to improve the following deficits and impairments:  Abnormal gait, Decreased range of motion, Difficulty walking, Pain, Decreased endurance, Impaired sensation, Decreased strength, Decreased mobility, Decreased balance  Visit Diagnosis: Muscle weakness (generalized)  Neuropathic pain  Other abnormalities of gait and  mobility     Problem List Patient Active Problem List   Diagnosis Date Noted  . Critical illness polyneuropathy ([place]) 09/30/2015  . Debility 09/24/2015  . Weakness   . Abnormality of gait   . Pain   . Neuropathic pain   . Anxiety state   . Yeast infection   . Anemia of chronic disease   . Hypokalemia   . Peripheral edema   . ATN (acute tubular necrosis) ([place])   . Scrotal edema   . Thrombocytopenia ([place])   . MODS (multiple organ dysfunction syndrome)   . Endotracheally intubated   . Hypovolemic shock ([place])   . Acute respiratory failure ([place])   . AKI (acute kidney injury) ([place])   . Lactic acidosis   . Hypoxia 08/27/2015  . Rhabdomyolysis 08/27/2015  . Hyperkalemia 08/27/2015  . ARF (acute renal failure) ([place]) 08/27/2015  . Routine general medical examination at a health care facility 08/23/2014  . Advance care planning 08/23/2014  . Meniere disease 04/08/2011  . ALLERGIC RHINITIS, SEASONAL 12/02/2007   Stacy Gardner, SPT  This entire session was performed under direct supervision and direction of a licensed therapist/therapist assistant . I have personally read, edited and approve of the note as written.  Trotter,Margaret PT, DPT 11/22/2015, 3:09 PM  [place] MAIN [facility] SERVICES [address] [place], [place], [address] Phone: [phone]   Fax:  [phone]  Name: DONOLD MAROTTO MRN: [id number] Date of Birth: 07/30/1965

## 2015-11-21 NOTE — Telephone Encounter (Signed)
Tried to call patient and mail box was full. Will have to try again later.

## 2015-11-22 ENCOUNTER — Ambulatory Visit: Payer: BLUE CROSS/BLUE SHIELD | Admitting: Physical Therapy

## 2015-11-22 ENCOUNTER — Telehealth: Payer: Self-pay | Admitting: *Deleted

## 2015-11-22 NOTE — Telephone Encounter (Signed)
Patient advised.

## 2015-11-22 NOTE — Telephone Encounter (Signed)
Patient says that he needs a letter from you clearing him to go back to work on light duty.  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

## 2015-11-25 NOTE — Telephone Encounter (Signed)
Letter done.  I'm okay with that as long as he feels safe at work and agrees to going back.  If he has troubles, then let me know.  Thanks.  Please make sure letter printed.

## 2015-11-26 ENCOUNTER — Encounter: Payer: BLUE CROSS/BLUE SHIELD | Admitting: Physical Therapy

## 2015-11-26 ENCOUNTER — Encounter: Payer: Self-pay | Admitting: Family Medicine

## 2015-11-26 NOTE — Telephone Encounter (Signed)
John MohsEvelyn Hoover with HR dept at [employer] left v/m;  received release to return to work on light duty status; John EeEvelyn request cb defining light duty status.

## 2015-11-26 NOTE — Telephone Encounter (Signed)
Pt request letter for work faxed to [phone]. Pt request cb when completed.

## 2015-11-26 NOTE — Telephone Encounter (Signed)
Georgiann MohsEvelyn Stanback called from Pt's HR and wanted additional information on "light duty" work schedule.  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  She declined request to fax information to us for clarification.  She would like callback at [phone]

## 2015-11-26 NOTE — Telephone Encounter (Signed)
Letter faxed.

## 2015-11-26 NOTE — Telephone Encounter (Signed)
Patient advised and will fax list to [phone]

## 2015-11-26 NOTE — Telephone Encounter (Signed)
Have patient send a list of job activities that he thinks he can reasonably do.  I wouldn't allow full activity at this point; no heavy lifting; avoid walking long distances or walking on uneven surfaces; no use of heavy/mechanized equipment.   Thanks.

## 2015-11-27 ENCOUNTER — Encounter: Payer: Self-pay | Admitting: Physical Therapy

## 2015-11-27 ENCOUNTER — Ambulatory Visit: Payer: BLUE CROSS/BLUE SHIELD | Admitting: Physical Therapy

## 2015-11-27 DIAGNOSIS — M792 Neuralgia and neuritis, unspecified: Secondary | ICD-10-CM

## 2015-11-27 DIAGNOSIS — R2689 Other abnormalities of gait and mobility: Secondary | ICD-10-CM | POA: Diagnosis not present

## 2015-11-27 DIAGNOSIS — M6281 Muscle weakness (generalized): Secondary | ICD-10-CM

## 2015-11-27 NOTE — Therapy (Signed)
[place] MAIN [facility] SERVICES [address] [place], [place], [address] Phone: [phone]   Fax:  [phone]  Physical Therapy Treatment  Patient Details  Name: John Hoover MRN: [id number] Date of Birth: 1966-05-14 Referring Provider: Delice Lesch, MD  Encounter Date: 11/27/2015      PT End of Session - 11/27/15 1034    Visit Number 13   Number of Visits 18   Date for PT Re-Evaluation 12/11/15   Authorization Type 30 visit limit   PT Start Time 0851   PT Stop Time 0931   PT Time Calculation (min) 40 min   Equipment Utilized During Treatment Gait belt   Activity Tolerance Patient tolerated treatment well;No increased pain   Behavior During Therapy [facility] for tasks assessed/performed      Past Medical History  Diagnosis Date  . Meniere's disease   . Flu   . Rhabdomyolysis   . Respiratory failure ([place])     VDRF flu 2017  . Anemia   . Neuropathy ([place])   . Physical deconditioning   . AKI (acute kidney injury) ([place])   . Critical illness polyneuropathy [facility])     Past Surgical History  Procedure Laterality Date  . Nasal sinus surgery      There were no vitals filed for this visit.      Subjective Assessment - 11/27/15 0857    Subjective Pt reports that he returned to work yesterday for the first time doing light duty.  He is sitting most of the day.  Pt reports that he has a "different" feeling in his feet but that he feels his feet well enough to drive.     Limitations Walking   Patient Stated Goals Return to prior level of functioning including running (did 2 miles /day).   Currently in Pain? Yes   Pain Score 7    Pain Location Foot   Pain Orientation Left;Right   Pain Onset More than a month ago      Treatment Hamstring curls on therapy ball, 3 sets x 10 reps, min VCs for hip extension and ball stability (pt supine)  Leg press BLEs 130# 2 sets x 10, heel raises  #90 2 sets x 10 reps, min VCs for hip abduction and  decrease terminal knee extension  Single leg ball toss against wall, 3 sets for BLEs, pt was able to stand for 30 seconds max on LLE and 15 seconds max on RLE, min VCs to bring center of gravity posteriorly, CGA for all single leg practice  Tandem stance with front leg on green dyna disc and back leg on yellow dyna disc. 30 second holds x 2 leading with BLE, min A to maintain stability with no HHA   Tandem stance with back leg standing on blue airex pad and front leg on green dyna disc while performing overhead press with 2.5lb wand, 2 sets x 5 reps BLEs, min VCs to weight bear more through back leg   Ladder drills, 2 laps x 2 using forward abduction steps, 2 laps x 2 lateral stepping forwards/backwards with emphasis on 2nd set for speed   Added single leg balance practice to HEP                            PT Education - 11/27/15 1032    Education provided Yes   Education Details HEP, importance of walking to stretch his legs during the day  at work    [employer]) Educated Patient   Methods Explanation;Demonstration;Verbal cues   Comprehension Verbalized understanding;Returned demonstration;Verbal cues required          PT Short Term Goals - 11/20/15 1400    PT SHORT TERM GOAL #1   Title The patient will return demo HEP for balance, LE strength (ankles), and general mobility.   Baseline Target date 11/11/2015   Time 4   Period Weeks   Status On-going   PT SHORT TERM GOAL #2   Title The patient will demonstrate correct sequencing of SPC for household mobility to improve safety with device.   Baseline Target date 11/11/2015   Time 4   Period Weeks   Status Achieved   PT SHORT TERM GOAL #3   Title The patient will negotiate 4 steps without a handrail modified indep to demo improved community surface negotiation.   Baseline Target date 11/11/2015   Time 4   Period Weeks   Status Achieved   PT SHORT TERM GOAL #4   Title The patient will negotiate level community  surfaces wth SPC modified indep x 1000 ft for dec'd dependence on RW.   Baseline Target date 11/11/2015   Time 4   Period Weeks   Status Achieved   PT SHORT TERM GOAL #5   Title The patient will improve Berg balance score from 43/56 up to 48/56 to demo dec'd risk for falls.   Baseline Target date 11/11/2015   Time 4   Period Weeks   Status Achieved           PT Long Term Goals - 11/20/15 1110    PT LONG TERM GOAL #1   Title The patient will improve ABC scale by 15% (baseline 63%).   Baseline Target date 12/11/2015   Time 8   Period Weeks   Status Achieved   PT LONG TERM GOAL #2   Title The patient will imrpove ankle strength to > or equal to 4/5 for improved foot clearance during gait activities.   Baseline Target date 12/11/2015   Time 8   Period Weeks   Status Achieved   PT LONG TERM GOAL #3   Title The patient will improve gait speed from 2.20 ft/sec to > or equal to 3.2 ft/sec to demo return to full community ambulation and faster gait speed.   Baseline Target date 12/11/2015   Time 8   Period Weeks   Status Achieved   PT LONG TERM GOAL #4   Title The patient will improve Berg from 43/56 to > or equal to 52/56 to demo improved high level balance control.   Baseline Target date 12/11/2015   Time 8   Period Weeks   Status Achieved   PT LONG TERM GOAL #5   Title The patient will negotiate level/unlevel community surfaces without a device independently.   Baseline Target date 12/11/2015   Time 8   Period Weeks   Status Achieved   PT LONG TERM GOAL #6   Title The patient will demo slow jog x 100 ft to begin to demo ability to return to prior exercise level.   Baseline Target date 12/11/2015   Time 8   Period Weeks   Status Not Met               Plan - 11/27/15 1035    Clinical Impression Statement Pt reports returning to work yesterday for the first time, even though he sits for most of the day  he notices his feet fatiging by the end of the day.  Pt was able to  perform single leg balance on both LEs for longer without LOB today than previous sessions.  Progressed hamstring curls on therapy ball  by adding an additional set. Pt was able to perform hamstring curls without requiring ball stability from the PT.  Pt was able to perform ladder drills with no LOB and with a quicker pace.  Pt would continue to benefit from further skilled physical therapy to progress dynamic balance and ankle strength for greater functional mobility.     Rehab Potential Good   PT Frequency 2x / week   PT Duration 8 weeks   PT Treatment/Interventions ADLs/Self Care Home Management;Therapeutic exercise;Therapeutic activities;Functional mobility training;Gait training;Patient/family education;Balance training;Neuromuscular re-education   PT Next Visit Plan lunges on bosu ball,  ball toss on airex pad, lunge position on dino disc, abduction over beam    PT Home Exercise Plan added single leg balance with support of counter to HEP    Consulted and Agree with Plan of Care Patient      Patient will benefit from skilled therapeutic intervention in order to improve the following deficits and impairments:  Abnormal gait, Decreased range of motion, Difficulty walking, Pain, Decreased endurance, Impaired sensation, Decreased strength, Decreased mobility, Decreased balance  Visit Diagnosis: Muscle weakness (generalized)  Neuropathic pain  Other abnormalities of gait and mobility     Problem List Patient Active Problem List   Diagnosis Date Noted  . Critical illness polyneuropathy ([place]) 09/30/2015  . Debility 09/24/2015  . Weakness   . Abnormality of gait   . Pain   . Neuropathic pain   . Anxiety state   . Yeast infection   . Anemia of chronic disease   . Hypokalemia   . Peripheral edema   . ATN (acute tubular necrosis) ([place])   . Scrotal edema   . Thrombocytopenia ([place])   . MODS (multiple organ dysfunction syndrome)   . Endotracheally intubated   . Hypovolemic shock  ([place])   . Acute respiratory failure ([place])   . AKI (acute kidney injury) ([place])   . Lactic acidosis   . Hypoxia 08/27/2015  . Rhabdomyolysis 08/27/2015  . Hyperkalemia 08/27/2015  . ARF (acute renal failure) ([place]) 08/27/2015  . Routine general medical examination at a health care facility 08/23/2014  . Advance care planning 08/23/2014  . Meniere disease 04/08/2011  . ALLERGIC RHINITIS, SEASONAL 12/02/2007   Stacy Gardner, SPT This entire session was performed under direct supervision and direction of a licensed therapist/therapist assistant . I have personally read, edited and approve of the note as written.   Trotter,Margaret  PT, DPT  11/27/2015, 11:20 AM  [place] MAIN [facility] SERVICES [address] [place], [place], [address] Phone: [phone]   Fax:  [phone]  Name: John Hoover MRN: [id number] Date of Birth: Sep 27, 1965

## 2015-11-28 ENCOUNTER — Encounter: Payer: Self-pay | Admitting: Family Medicine

## 2015-11-28 ENCOUNTER — Telehealth: Payer: Self-pay | Admitting: Family Medicine

## 2015-11-28 ENCOUNTER — Encounter: Payer: BLUE CROSS/BLUE SHIELD | Admitting: Physical Therapy

## 2015-11-28 ENCOUNTER — Encounter: Payer: Self-pay | Admitting: Physical Medicine & Rehabilitation

## 2015-11-28 ENCOUNTER — Encounter
Payer: BLUE CROSS/BLUE SHIELD | Attending: Physical Medicine & Rehabilitation | Admitting: Physical Medicine & Rehabilitation

## 2015-11-28 ENCOUNTER — Encounter: Payer: Self-pay | Admitting: *Deleted

## 2015-11-28 VITALS — BP 129/92 | HR 95 | Resp 16

## 2015-11-28 DIAGNOSIS — R200 Anesthesia of skin: Secondary | ICD-10-CM | POA: Diagnosis not present

## 2015-11-28 DIAGNOSIS — Z5181 Encounter for therapeutic drug level monitoring: Secondary | ICD-10-CM | POA: Insufficient documentation

## 2015-11-28 DIAGNOSIS — R269 Unspecified abnormalities of gait and mobility: Secondary | ICD-10-CM | POA: Diagnosis not present

## 2015-11-28 DIAGNOSIS — R61 Generalized hyperhidrosis: Secondary | ICD-10-CM | POA: Insufficient documentation

## 2015-11-28 DIAGNOSIS — IMO0002 Reserved for concepts with insufficient information to code with codable children: Secondary | ICD-10-CM

## 2015-11-28 DIAGNOSIS — G6281 Critical illness polyneuropathy: Secondary | ICD-10-CM | POA: Insufficient documentation

## 2015-11-28 DIAGNOSIS — G9050 Complex regional pain syndrome I, unspecified: Secondary | ICD-10-CM | POA: Diagnosis not present

## 2015-11-28 DIAGNOSIS — G4700 Insomnia, unspecified: Secondary | ICD-10-CM | POA: Insufficient documentation

## 2015-11-28 DIAGNOSIS — G479 Sleep disorder, unspecified: Secondary | ICD-10-CM | POA: Diagnosis not present

## 2015-11-28 DIAGNOSIS — M792 Neuralgia and neuritis, unspecified: Secondary | ICD-10-CM | POA: Diagnosis not present

## 2015-11-28 DIAGNOSIS — M7989 Other specified soft tissue disorders: Secondary | ICD-10-CM | POA: Insufficient documentation

## 2015-11-28 DIAGNOSIS — Z79899 Other long term (current) drug therapy: Secondary | ICD-10-CM | POA: Insufficient documentation

## 2015-11-28 DIAGNOSIS — D649 Anemia, unspecified: Secondary | ICD-10-CM | POA: Insufficient documentation

## 2015-11-28 DIAGNOSIS — R609 Edema, unspecified: Secondary | ICD-10-CM | POA: Diagnosis not present

## 2015-11-28 DIAGNOSIS — N170 Acute kidney failure with tubular necrosis: Secondary | ICD-10-CM | POA: Diagnosis not present

## 2015-11-28 DIAGNOSIS — G894 Chronic pain syndrome: Secondary | ICD-10-CM | POA: Insufficient documentation

## 2015-11-28 DIAGNOSIS — J9690 Respiratory failure, unspecified, unspecified whether with hypoxia or hypercapnia: Secondary | ICD-10-CM | POA: Insufficient documentation

## 2015-11-28 DIAGNOSIS — H8109 Meniere's disease, unspecified ear: Secondary | ICD-10-CM | POA: Insufficient documentation

## 2015-11-28 MED ORDER — PREGABALIN 150 MG PO CAPS: 150.0000 mg | ORAL_CAPSULE | Freq: Two times a day (BID) | ORAL | Status: DC

## 2015-11-28 MED ORDER — METHYLPREDNISOLONE 8 MG PO TABS: 8.0000 mg | ORAL_TABLET | Freq: Four times a day (QID) | ORAL | Status: DC

## 2015-11-28 NOTE — Telephone Encounter (Signed)
Please make sure letter printed, done today.  Notify pt to pick up after I sign it.  Thanks.

## 2015-11-28 NOTE — Telephone Encounter (Signed)
Letter printed for signature. Left message on voicemail for patient to call back.

## 2015-11-28 NOTE — Telephone Encounter (Signed)
Patient notified by telephone that letter will be up front ready for pickup this afternoon. Patient stated that he will stop by after 5:00 to pick it up.

## 2015-11-28 NOTE — Progress Notes (Signed)
Subjective:    Patient ID: John Hoover, male    DOB: 1965/12/06, 50 y.o.   MRN: [id number]  HPI  50 year old male presents for follow up for neuropathic pain.  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  He was seen in clinic and discharged to PCP, but returned due to pain.  B/l feet R>L.  Burning, tingling, sharp.  Constant pain.  Oxy improves the pain. Ambulating exacerbates the pain.  Still has edema.  Lyrica 75 BID helps minimally.  Elavil 150mg  qhs helps.  Oxy as needed.  Pt started driving again.   Pain Inventory Average Pain 6 Pain Right Now 6 My pain is constant, burning and tingling  In the last 24 hours, has pain interfered with the following? General activity 6 Relation with others 4 Enjoyment of life 8 What TIME of day is your pain at its worst? evening Sleep (in general) NA  Pain is worse with: walking and sitting Pain improves with: rest Relief from Meds: na  Mobility walk without assistance how many minutes can you walk? 45 ability to climb steps?  yes do you drive?  yes Do you have any goals in this area?  yes  Function employed # of hrs/week 60 what is your job? [employer]  Neuro/Psych numbness tingling  Prior Studies Any changes since last visit?  no  Physicians involved in your care Any changes since last visit?  no   Family History  Problem Relation Age of Onset  . Hypertension Mother   . Hypertension Father   . Colon cancer Neg Hx   . Prostate cancer Neg Hx    Social History   Social History  . Marital Status: Married    Spouse Name: N/A  . Number of Children: N/A  . Years of Education: N/A   Occupational History  . Works in Product/process development scientistconcrete/ building    Social History Main Topics  . Smoking status: Never Smoker   . Smokeless tobacco: Never Used  . Alcohol Use: Yes     Comment: weendends  . Drug Use: No  . Sexual Activity: Not Asked   Other Topics Concern  . None   Social History Narrative   1 daughter (who  has DM1) and 1 son   Married 25+ years   Past Surgical History  Procedure Laterality Date  . Nasal sinus surgery     Past Medical History  Diagnosis Date  . Meniere's disease   . Flu   . Rhabdomyolysis   . Respiratory failure (HCC)     VDRF flu 2017  . Anemia   . Neuropathy (HCC)   . Physical deconditioning   . AKI (acute kidney injury) (HCC)   . Critical illness polyneuropathy (HCC)    BP 129/92 mmHg  Pulse 95  Resp 16  SpO2 98%  Opioid Risk Score:   Fall Risk Score:  `1  Depression screen PHQ 2/9  Dep[facility] screen [facility] 11/28/2015 10/18/2015  Decreased Interest 0 1  Down, Depressed, Hopeless 0 0  PHQ - 2 Score 0 1   Review of Systems  Constitutional: Positive for diaphoresis.  Neurological: Positive for numbness.       Gait Instability  Psychiatric/Behavioral:       Anxiety  All other systems reviewed and are negative.     Objective:   Physical Exam Constitutional: He appears well-developed and well-nourished. No distress.  HENT:  Normocephalic and atraumatic.   Eyes: Conjunctivae and EOM are  normal.    Cardiovascular: Regular Rate and rhythm.    Respiratory: Effort normal. No respiratory distress. Clear to ausculation.   GI: Soft. Bowel sounds are normal. He exhibits no distension. There is no tenderness.  Musculoskeletal: He exhibits edema and tenderness in bilateral feet, R>L.  Decreased ROM b/l ankles Gait: Antalgic, mild steppage gait, significantly improved Neurological: He is alert and oriented.  Motor: 5-/5 throughout, except bilateral ankle dorsi flexion: 4+/5, but limited ROM Left worse than right  Skin: Erythema at tips of RLE digits. Ischemic changes to mainly to toes (improved); fingertips resolved Psychiatric: His behavior is normal. Thought content normal. Cognition and memory are normal.    Assessment & Plan:  50 year old male presents for neuropathic pain.  1. 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 Sleep disturbance  Cont Elavil 150mg    3. Peripheral edema:   Likely secondary to CRPS  Cont elevations   Cont limit salt intake  Encouraged use of ice if sensate    4. Abnormality of gait  Secondary to numbness and  pain  Denies falls  No assistive device

## 2015-11-29 ENCOUNTER — Ambulatory Visit: Payer: BLUE CROSS/BLUE SHIELD | Admitting: Physical Therapy

## 2015-11-30 ENCOUNTER — Other Ambulatory Visit: Payer: Self-pay | Admitting: Physical Medicine and Rehabilitation

## 2015-12-03 NOTE — Telephone Encounter (Signed)
I thought he had weaned off metoprolol.  Let me know if still on. Thanks.

## 2015-12-03 NOTE — Telephone Encounter (Signed)
Blood pressure medication needs refill, Dr. Allena KatzPatel (physical medicine and rehabilitation) would like cardiology or PCP to prescribe this medication. Thank You

## 2015-12-05 ENCOUNTER — Encounter: Payer: Self-pay | Admitting: Physical Therapy

## 2015-12-05 ENCOUNTER — Ambulatory Visit: Payer: BLUE CROSS/BLUE SHIELD | Attending: Physical Medicine & Rehabilitation | Admitting: Physical Therapy

## 2015-12-05 ENCOUNTER — Encounter: Payer: BLUE CROSS/BLUE SHIELD | Admitting: Physical Therapy

## 2015-12-05 DIAGNOSIS — M792 Neuralgia and neuritis, unspecified: Secondary | ICD-10-CM | POA: Diagnosis present

## 2015-12-05 DIAGNOSIS — R2689 Other abnormalities of gait and mobility: Secondary | ICD-10-CM

## 2015-12-05 DIAGNOSIS — M6281 Muscle weakness (generalized): Secondary | ICD-10-CM

## 2015-12-05 NOTE — Therapy (Signed)
[place] MAIN [facility] SERVICES [address] [place], [place], [address] Phone: [phone]   Fax:  [phone]  Physical Therapy Treatment  Patient Details  Name: BRYOR RAMI MRN: [id number] Date of Birth: 1965/11/02 Referring Provider: Delice Lesch, MD  Encounter Date: 12/05/2015      PT End of Session - 12/05/15 0953    Visit Number 14   Number of Visits 18   Date for PT Re-Evaluation 12/11/15   Authorization Type 30 visit limit   PT Start Time 0900   PT Stop Time 0945   PT Time Calculation (min) 45 min   Equipment Utilized During Treatment Gait belt   Activity Tolerance Patient tolerated treatment well;No increased pain   Behavior During Therapy [facility] for tasks assessed/performed      Past Medical History  Diagnosis Date  . Meniere's disease   . Flu   . Rhabdomyolysis   . Respiratory failure ([place])     VDRF flu 2017  . Anemia   . Neuropathy ([place])   . Physical deconditioning   . AKI (acute kidney injury) ([place])   . Critical illness polyneuropathy [facility])     Past Surgical History  Procedure Laterality Date  . Nasal sinus surgery      There were no vitals filed for this visit.      Subjective Assessment - 12/05/15 0903    Subjective Pt reports he saw his MD since last PT visit.  MD doubled his lyrica dosage and started him on a steroid.   Since the medication change, pt reports decreased swelling in both feet to the point where he can now wear his smaller shoes.  He reports a decrease in pain, N/T in both feet as well.     Limitations Walking   Patient Stated Goals Return to prior level of functioning including running (did 2 miles /day).   Currently in Pain? Yes   Pain Score 3    Pain Location Foot   Pain Orientation Left;Right  Right worse than left    Pain Descriptors / Indicators Numbness;Tingling   Pain Type Acute pain   Pain Onset More than a month ago   Multiple Pain Sites No     Treatment  Nustep x 4 mins,  BLEs,  level 4 (unbilled)  Single leg bridge 2 sets x 10 reps, alternating legs with min VCs for posture and decrease tension on low back Lateral line hop with 1 HHA, alternating legs, min VCs to decrease UE support, no increase in N/T in bilateral feet  Monster walks with red  abduction theraband distal to knees forward and backwards x 2 laps, min VCS to increase step length, PT provided close supervision  Dynamic lunges with front leg on bosu ball, 2 sets x 10 reps, 2 HHA, min VCs to keep back leg straight  Dynamic side lunge on bosu ball, 1 set x 10 reps BLEs, 1 HHA progressing to no HHA, min VCs to maintain forward gaze  Single leg balance with ball toss against wall, 2 sets x BLEs, pt was able to stand on R leg for 20 seconds at a time and L leg 30 seconds at a time  Quantrum leg press BLEs #130 1 set x 15 reps, single leg press 10 reps x BLEs #90, min VCs to decrease terminal knee extension and maintaining hip abduction  Calf raise on quantrum leg press, 1 set x 20 reps at #120  Lunged position with back leg on  blue airex and front leg on yellow dyna disc x 15 ball tosses leading with BLEs, pt required min A for stability and min VCs to increase BOS   Added supine bridges with abduction band and single leg balance on pillow to increase difficulty to HEP                          PT Education - 12/05/15 0952    Education provided Yes   Education Details advancement of HEP, ROM and stretch breaks during work    [employer]) Educated Patient   Methods Explanation;Demonstration;Verbal cues   Comprehension Verbalized understanding;Returned demonstration;Verbal cues required          PT Short Term Goals - 11/20/15 1400    PT SHORT TERM GOAL #1   Title The patient will return demo HEP for balance, LE strength (ankles), and general mobility.   Baseline Target date 11/11/2015   Time 4   Period Weeks   Status On-going   PT SHORT TERM GOAL #2   Title The patient will  demonstrate correct sequencing of SPC for household mobility to improve safety with device.   Baseline Target date 11/11/2015   Time 4   Period Weeks   Status Achieved   PT SHORT TERM GOAL #3   Title The patient will negotiate 4 steps without a handrail modified indep to demo improved community surface negotiation.   Baseline Target date 11/11/2015   Time 4   Period Weeks   Status Achieved   PT SHORT TERM GOAL #4   Title The patient will negotiate level community surfaces wth SPC modified indep x 1000 ft for dec'd dependence on RW.   Baseline Target date 11/11/2015   Time 4   Period Weeks   Status Achieved   PT SHORT TERM GOAL #5   Title The patient will improve Berg balance score from 43/56 up to 48/56 to demo dec'd risk for falls.   Baseline Target date 11/11/2015   Time 4   Period Weeks   Status Achieved           PT Long Term Goals - 11/20/15 1110    PT LONG TERM GOAL #1   Title The patient will improve ABC scale by 15% (baseline 63%).   Baseline Target date 12/11/2015   Time 8   Period Weeks   Status Achieved   PT LONG TERM GOAL #2   Title The patient will imrpove ankle strength to > or equal to 4/5 for improved foot clearance during gait activities.   Baseline Target date 12/11/2015   Time 8   Period Weeks   Status Achieved   PT LONG TERM GOAL #3   Title The patient will improve gait speed from 2.20 ft/sec to > or equal to 3.2 ft/sec to demo return to full community ambulation and faster gait speed.   Baseline Target date 12/11/2015   Time 8   Period Weeks   Status Achieved   PT LONG TERM GOAL #4   Title The patient will improve Berg from 43/56 to > or equal to 52/56 to demo improved high level balance control.   Baseline Target date 12/11/2015   Time 8   Period Weeks   Status Achieved   PT LONG TERM GOAL #5   Title The patient will negotiate level/unlevel community surfaces without a device independently.   Baseline Target date 12/11/2015   Time 8   Period  Weeks  Status Achieved   PT LONG TERM GOAL #6   Title The patient will demo slow jog x 100 ft to begin to demo ability to return to prior exercise level.   Baseline Target date 12/11/2015   Time 8   Period Weeks   Status Not Met               Plan - 12/05/15 0953    Clinical Impression Statement Pt reports to therapy today with decreased N/T, swelling, and decrease foot slap when walking.  He is feeling more confident in his abilities and more comfortable throughout the day with changes to his medication.  Pt was able to progress to singe leg bridges with min VCS for increased ROM.  Pt required less assistance with single leg stance which is a progression since previous visit.  He was able to initate a lateral single leg hop without an increase in N/T or LOB using 1 HHA.  Pt continues to make progress and become more functional with his everyday tasks.  He will continue to benefit from further skilled physical thearpy to advance his dynamic balance and LE strength to promote greater functional mobility.    Rehab Potential Good   PT Frequency 2x / week   PT Duration 8 weeks   PT Treatment/Interventions ADLs/Self Care Home Management;Therapeutic exercise;Therapeutic activities;Functional mobility training;Gait training;Patient/family education;Balance training;Neuromuscular re-education   PT Next Visit Plan ball toss on airex pad, balance on top of bosu, single leg bridge, squats with overhead press, ladder drills, double leg hop    PT Home Exercise Plan added supine bridges with abduction band, added singe leg stance on pillow    Consulted and Agree with Plan of Care Patient      Patient will benefit from skilled therapeutic intervention in order to improve the following deficits and impairments:  Abnormal gait, Decreased range of motion, Difficulty walking, Pain, Decreased endurance, Impaired sensation, Decreased strength, Decreased mobility, Decreased balance  Visit  Diagnosis: Muscle weakness (generalized)  Other abnormalities of gait and mobility  Neuropathic pain     Problem List Patient Active Problem List   Diagnosis Date Noted  . Critical illness polyneuropathy ([place]) 09/30/2015  . Debility 09/24/2015  . Weakness   . Abnormality of gait   . Pain   . Neuropathic pain   . Anxiety state   . Yeast infection   . Anemia of chronic disease   . Hypokalemia   . Peripheral edema   . ATN (acute tubular necrosis) ([place])   . Scrotal edema   . Thrombocytopenia ([place])   . MODS (multiple organ dysfunction syndrome)   . Endotracheally intubated   . Hypovolemic shock ([place])   . Acute respiratory failure ([place])   . AKI (acute kidney injury) ([place])   . Lactic acidosis   . Hypoxia 08/27/2015  . Rhabdomyolysis 08/27/2015  . Hyperkalemia 08/27/2015  . ARF (acute renal failure) ([place]) 08/27/2015  . Routine general medical examination at a health care facility 08/23/2014  . Advance care planning 08/23/2014  . Meniere disease 04/08/2011  . ALLERGIC RHINITIS, SEASONAL 12/02/2007   Stacy Gardner, SPT  This entire session was performed under direct supervision and direction of a licensed therapist/therapist assistant . I have personally read, edited and approve of the note as written.  Trotter,Margaret PT, DPT 12/05/2015, 10:14 AM  [place] MAIN [facility] SERVICES [address] [place], [place], [address] Phone: [phone]   Fax:  [phone]  Name: KHYLER ESCHMANN MRN:  [id number] Date of Birth: January 05, 1966

## 2015-12-07 ENCOUNTER — Ambulatory Visit: Payer: BLUE CROSS/BLUE SHIELD | Admitting: Physical Therapy

## 2015-12-07 ENCOUNTER — Encounter: Payer: Self-pay | Admitting: Physical Therapy

## 2015-12-07 ENCOUNTER — Encounter: Payer: BLUE CROSS/BLUE SHIELD | Admitting: Physical Therapy

## 2015-12-07 DIAGNOSIS — R2689 Other abnormalities of gait and mobility: Secondary | ICD-10-CM

## 2015-12-07 DIAGNOSIS — M6281 Muscle weakness (generalized): Secondary | ICD-10-CM

## 2015-12-07 NOTE — Patient Instructions (Signed)
Remeasured Ankle ROM to assess progress  Functional gait assessment to assess variable walking 26/30

## 2015-12-07 NOTE — Therapy (Signed)
[place] MAIN [facility] SERVICES [address] [place], [place], [address] Phone: [phone]   Fax:  [phone]  Physical Therapy Treatment/Progress Note   Patient Details  Name: John Hoover MRN: [id number] Date of Birth: 06-26-1965 Referring Provider: Delice Lesch, MD  Encounter Date: 12/07/2015      PT End of Session - 12/07/15 0938    Visit Number 15   Number of Visits 22   Date for PT Re-Evaluation 01/04/16   Authorization Type 30 visit limit   PT Start Time 0845   PT Stop Time 0930   PT Time Calculation (min) 45 min   Equipment Utilized During Treatment Gait belt   Activity Tolerance Patient tolerated treatment well;No increased pain   Behavior During Therapy [facility] for tasks assessed/performed      Past Medical History  Diagnosis Date  . Meniere's disease   . Flu   . Rhabdomyolysis   . Respiratory failure ([place])     VDRF flu 2017  . Anemia   . Neuropathy ([place])   . Physical deconditioning   . AKI (acute kidney injury) ([place])   . Critical illness polyneuropathy [facility])     Past Surgical History  Procedure Laterality Date  . Nasal sinus surgery      There were no vitals filed for this visit.      Subjective Assessment - 12/07/15 0847    Subjective Pt reports that swelling continues to stay down in bilateral feet. He would like to continue therapy 1x/week to make sure he can continue progress when he starts to ween off the steroids.     Limitations Walking   Patient Stated Goals Return to prior level of functioning including running (did 2 miles /day).   Currently in Pain? Yes   Pain Score 3    Pain Location Foot   Pain Orientation Right   Pain Descriptors / Indicators Tightness;Moaning;Numbness   Pain Type Acute pain   Pain Onset More than a month ago            [facility] PT Assessment - 12/07/15 0001    Observation/Other Assessments-Edema    Edema --  reduction in bilateral feet swelling since previous visits    Sensation   Additional Comments 5.07 monofilament testing ot bilateral feet, pt demonstrated decreased sensation on plantar surface of both feet, L foot detects light touch better than R foot, increased sensation on medial and lateral portions of foot with decreased senstaion across toes    AROM   Right Ankle Dorsiflexion 3   Right Ankle Eversion 11   Left Ankle Dorsiflexion 6   Left Ankle Eversion 24   Functional Gait  Assessment   Gait Level Surface Walks 20 ft in less than 5.5 sec, no assistive devices, good speed, no evidence for imbalance, normal gait pattern, deviates no more than 6 in outside of the 12 in walkway width.   Change in Gait Speed Able to smoothly change walking speed without loss of balance or gait deviation. Deviate no more than 6 in outside of the 12 in walkway width.   Gait with Horizontal Head Turns Performs head turns smoothly with no change in gait. Deviates no more than 6 in outside 12 in walkway width   Gait with Vertical Head Turns Performs head turns with no change in gait. Deviates no more than 6 in outside 12 in walkway width.   Gait and Pivot Turn Pivot turns safely within 3 sec and stops quickly with  no loss of balance.   Step Over Obstacle Is able to step over 2 stacked shoe boxes taped together (9 in total height) without changing gait speed. No evidence of imbalance.   Gait with Narrow Base of Support Ambulates 7-9 steps.   Gait with Eyes Closed Walks 20 ft, uses assistive device, slower speed, mild gait deviations, deviates 6-10 in outside 12 in walkway width. Ambulates 20 ft in less than 9 sec but greater than 7 sec.   Ambulating Backwards Walks 20 ft, uses assistive device, slower speed, mild gait deviations, deviates 6-10 in outside 12 in walkway width.   Steps Alternating feet, must use rail.   Total Score 26     Treatment  Remeasured motion to assess progress Checked sensation with 5.07 monofilament, pt demonstrated decreased sensation on plantar  surface of both feet and top of toes on bilateral feet  Functional gait assessment performed to assess variable gait, pt demonstrated most difficulty with tandem walking and walking with eyes closed  Wall squats against therapy ball with emphasis to keep heels on the floor, pt was able to perform 90/90 squat with good control and min VCs for posture and starting position  Ladder drills-  Lateral hops through ladder leading with BLEs x 1 lap each, pt encouraged to perform small hop into next square and then once foot set, hop with second leg, CGA for stability   Abduction steps into and out of ladder leading with BLEs x 1 lap each, encouraged to perform with speed, CGA for stability with one LOB corrected by PT   Anterior/Posterior stepping through ladder leading with BLEs x 1 lap each, pt demonstrated difficulty with backwards step maintaining stability, 2 LOB corrected by PT, min A and min VCs to perform slow  and controlled   Abduction/Adduction steps onto and off of aerobic step with 2 risers, 1 set x 10 reps leading with each LE, 2 HHA required, min VCs for stability, demonstrated reported "he hadn't felt that muscle work before"  Lateral step down from aerobic step with 2 risers, 1 set x 10 reps leading with each LE, min Vcs for eccentric control and valgus control   Reeducated and redemonstrated HEP including red theraband resistance DF/PF/eversion/inversion, bridges with red theraband resistance and clamshells with red theraband resistance                         PT Education - 12/07/15 0936    Education provided Yes   Education Details reeducated on elevating legs to reduce swelling, reemphasized HEP   Person(s) Educated Patient   Methods Explanation;Demonstration;Verbal cues   Comprehension Verbalized understanding;Returned demonstration;Verbal cues required          PT Short Term Goals - 11/20/15 1400    PT SHORT TERM GOAL #1   Title The patient will return  demo HEP for balance, LE strength (ankles), and general mobility.   Baseline Target date 11/11/2015   Time 4   Period Weeks   Status On-going   PT SHORT TERM GOAL #2   Title The patient will demonstrate correct sequencing of SPC for household mobility to improve safety with device.   Baseline Target date 11/11/2015   Time 4   Period Weeks   Status Achieved   PT SHORT TERM GOAL #3   Title The patient will negotiate 4 steps without a handrail modified indep to demo improved community surface negotiation.   Baseline Target date 11/11/2015   Time  4   Period Weeks   Status Achieved   PT SHORT TERM GOAL #4   Title The patient will negotiate level community surfaces wth SPC modified indep x 1000 ft for dec'd dependence on RW.   Baseline Target date 11/11/2015   Time 4   Period Weeks   Status Achieved   PT SHORT TERM GOAL #5   Title The patient will improve Berg balance score from 43/56 up to 48/56 to demo dec'd risk for falls.   Baseline Target date 11/11/2015   Time 4   Period Weeks   Status Achieved           PT Long Term Goals - 12/07/15 1114    PT LONG TERM GOAL #1   Title The patient will improve ABC scale by 15% (baseline 63%).   Baseline Target date 12/11/2015   Time 8   Period Weeks   Status Achieved   PT LONG TERM GOAL #2   Title The patient will imrpove ankle strength to > or equal to 4/5 for improved foot clearance during gait activities.   Baseline Target date 12/11/2015   Time 8   Period Weeks   Status Achieved   PT LONG TERM GOAL #3   Title The patient will improve gait speed from 2.20 ft/sec to > or equal to 3.2 ft/sec to demo return to full community ambulation and faster gait speed.   Baseline Target date 12/11/2015   Time 8   Period Weeks   Status Achieved   PT LONG TERM GOAL #4   Title The patient will improve Berg from 43/56 to > or equal to 52/56 to demo improved high level balance control.   Baseline Target date 12/11/2015   Time 8   Period Weeks    Status Achieved   PT LONG TERM GOAL #5   Title The patient will negotiate level/unlevel community surfaces without a device independently.   Baseline Target date 12/11/2015   Time 8   Period Weeks   Status Achieved   Additional Long Term Goals   Additional Long Term Goals Yes   PT LONG TERM GOAL #6   Title The patient will demo slow jog x 100 ft to begin to demo ability to return to prior exercise level.   Baseline Target date 12/11/2015   Time 8   Period Weeks   Status Not Met   PT LONG TERM GOAL #7   Title Pt will improve functional gait assessment score of 28/30 to decrease fall risk and increase functional mobility    Time 4   Period Weeks   Status New   PT LONG TERM GOAL #8   Title Pt will be able to hold SLS for 30 seconds on BLEs to increase balance and decrease falls risk.     Time 4   Period Weeks   Status New               Plan - 12/07/15 1045    Clinical Impression Statement Pt reports that pain remains at 3/10 and swelling in bilateral feet has stayed down.  He continues to report that work is going well and reeducated on the importance of elevating throughout the day, performing AROM for his ankles and taking walking breaks.  His ankle AROM has improved greatly,  he increaed by 5 degrees for R ankle dorsiflexion, 1 degree by R ankle eversion, 10  degrees for L ankle dorsiflexion and 5 degrees for left ankle eversion.  He  continues to be more limited in his R ankle in terms of ROM and sensation.  He has decreased sensation on palmer surfaces of both feet and across his toes.  Pt scored a 26/30 on the functional gait assessment with the most difficulty ambulating with his eyes closed and walking heel to toe.  He reports that he feels least comfortable walking down steps at this point.  Continued to work on speed and ankle strategy with ladder drills and eccentric control with lateral step downs off of an aerobic step with 2 risers.  Pt would benefit from continued skilled  PT at once/week to make sure he maintains progress after being weened off steriods to promote greater functional independence.     Rehab Potential Good   PT Frequency 1x / week   PT Duration 4 weeks   PT Treatment/Interventions ADLs/Self Care Home Management;Therapeutic exercise;Therapeutic activities;Functional mobility training;Gait training;Patient/family education;Balance training;Neuromuscular re-education   PT Next Visit Plan ball toss on airex pad, balance on top of bosu, single leg bridge, squats with overhead press, ladder drills, double leg hop    PT Home Exercise Plan added supine bridges with abduction band, added singe leg stance on pillow    Consulted and Agree with Plan of Care Patient      Patient will benefit from skilled therapeutic intervention in order to improve the following deficits and impairments:  Abnormal gait, Decreased range of motion, Difficulty walking, Pain, Decreased endurance, Impaired sensation, Decreased strength, Decreased mobility, Decreased balance  Visit Diagnosis: Muscle weakness (generalized) - Plan: PT plan of care cert/re-cert  Other abnormalities of gait and mobility - Plan: PT plan of care cert/re-cert     Problem List Patient Active Problem List   Diagnosis Date Noted  . Critical illness polyneuropathy ([place]) 09/30/2015  . Debility 09/24/2015  . Weakness   . Abnormality of gait   . Pain   . Neuropathic pain   . Anxiety state   . Yeast infection   . Anemia of chronic disease   . Hypokalemia   . Peripheral edema   . ATN (acute tubular necrosis) ([place])   . Scrotal edema   . Thrombocytopenia ([place])   . MODS (multiple organ dysfunction syndrome)   . Endotracheally intubated   . Hypovolemic shock ([place])   . Acute respiratory failure ([place])   . AKI (acute kidney injury) ([place])   . Lactic acidosis   . Hypoxia 08/27/2015  . Rhabdomyolysis 08/27/2015  . Hyperkalemia 08/27/2015  . ARF (acute renal failure) ([place]) 08/27/2015  . Routine  general medical examination at a health care facility 08/23/2014  . Advance care planning 08/23/2014  . Meniere disease 04/08/2011  . ALLERGIC RHINITIS, SEASONAL 12/02/2007   Stacy Gardner, SPT  This entire session was performed under direct supervision and direction of a licensed therapist/therapist assistant . I have personally read, edited and approve of the note as written.  Trotter,Margaret PT, DPT 12/07/2015, 11:29 AM  [place] MAIN [facility] SERVICES [address] [place], [place], [address] Phone: [phone]   Fax:  [phone]  Name: John Hoover MRN: [id number] Date of Birth: March 03, 1966

## 2015-12-11 ENCOUNTER — Ambulatory Visit: Payer: BLUE CROSS/BLUE SHIELD | Admitting: Physical Therapy

## 2015-12-11 ENCOUNTER — Encounter: Payer: Self-pay | Admitting: Physical Therapy

## 2015-12-11 DIAGNOSIS — R2689 Other abnormalities of gait and mobility: Secondary | ICD-10-CM

## 2015-12-11 DIAGNOSIS — M6281 Muscle weakness (generalized): Secondary | ICD-10-CM

## 2015-12-11 NOTE — Therapy (Signed)
[place] MAIN [facility] SERVICES [address] [place], [place], [address] Phone: [phone]   Fax:  [phone]  Physical Therapy Treatment  Patient Details  Name: John Hoover MRN: [id number] Date of Birth: 1965-07-04 Referring Provider: Delice Lesch, MD  Encounter Date: 12/11/2015      PT End of Session - 12/11/15 0933    Visit Number 16   Number of Visits 22   Date for PT Re-Evaluation 01/04/16   Authorization Type 30 visit limit   PT Start Time 0845   PT Stop Time 0930   PT Time Calculation (min) 45 min   Equipment Utilized During Treatment Gait belt   Activity Tolerance Patient tolerated treatment well;No increased pain   Behavior During Therapy [facility] for tasks assessed/performed      Past Medical History  Diagnosis Date  . Meniere's disease   . Flu   . Rhabdomyolysis   . Respiratory failure ([place])     VDRF flu 2017  . Anemia   . Neuropathy ([place])   . Physical deconditioning   . AKI (acute kidney injury) ([place])   . Critical illness polyneuropathy [facility])     Past Surgical History  Procedure Laterality Date  . Nasal sinus surgery      There were no vitals filed for this visit.      Subjective Assessment - 12/11/15 0847    Subjective Pt reports that the swelling has stayed down in both feet.  Today instead of reporting neuropathic pain in feet pt compliants of tenderness and bleeding from new skin regrowth on the bottom of his feet.  Recommended that pt wear socks with his tennis shoes on the treadmill.    Limitations Walking   Patient Stated Goals Return to prior level of functioning including running (did 2 miles /day).   Currently in Pain? Yes   Pain Score 5    Pain Location Foot   Pain Orientation Right;Left   Pain Descriptors / Indicators Tender   Pain Onset More than a month ago      Treatment  Nustep x 4 mins BLEs on level 3 (unbilled)  Quantum leg press BLEs 150#, 2 sets x 10 reps, min VCs to decrease  terminal knee extension Quantum leg press Single leg 100#, 1 set x 10 reps each leg Heel raises on Quantum leg press, 75#, 1 set x 20 reps, min VCs for greater ROM,  Supine bridges with feet supported on therapy ball, 2 sets x 10 reps, min VCs for increased glut activation  Sidelying straight leg abduction with quad activation, 2 sets x 10 reps BLEs, min VCs to increase quad activation at starting position  Forward alternating lunges on bosu ball, 2 sets x 10 reps alternating legs, 2 finger A for stability  Sideways lunges on bosu ball,1 sets x 10 reps, min vcs to keep support leg straight  Standing hip flexion marches on top of bosu ball, 3 sets x 10 reps, CGA for stability, min VCs for glut activation and postural stability  Oblique twists standing on bosu ball, 2 sets x 10 reps, CGA for stability, min VCs to twist and follow motion with eyes  Lateral eccentric step downs on 4 inch step, 1 set x 10 reps, min VCs for starting position and knee control   Added eccentric step downs on first step at home for safety to practice reciprocal stair stepping for HEP  PT Education - 12/11/15 0908    Education provided Yes   Education Details Hep, walking program, wearing proper socks, checking feet daily    Person(s) Educated Patient   Methods Explanation;Demonstration;Verbal cues   Comprehension Returned demonstration;Verbal cues required;Verbalized understanding          PT Short Term Goals - 11/20/15 1400    PT SHORT TERM GOAL #1   Title The patient will return demo HEP for balance, LE strength (ankles), and general mobility.   Baseline Target date 11/11/2015   Time 4   Period Weeks   Status On-going   PT SHORT TERM GOAL #2   Title The patient will demonstrate correct sequencing of SPC for household mobility to improve safety with device.   Baseline Target date 11/11/2015   Time 4   Period Weeks   Status Achieved   PT SHORT TERM GOAL #3    Title The patient will negotiate 4 steps without a handrail modified indep to demo improved community surface negotiation.   Baseline Target date 11/11/2015   Time 4   Period Weeks   Status Achieved   PT SHORT TERM GOAL #4   Title The patient will negotiate level community surfaces wth SPC modified indep x 1000 ft for dec'd dependence on RW.   Baseline Target date 11/11/2015   Time 4   Period Weeks   Status Achieved   PT SHORT TERM GOAL #5   Title The patient will improve Berg balance score from 43/56 up to 48/56 to demo dec'd risk for falls.   Baseline Target date 11/11/2015   Time 4   Period Weeks   Status Achieved           PT Long Term Goals - 12/07/15 1114    PT LONG TERM GOAL #1   Title The patient will improve ABC scale by 15% (baseline 63%).   Baseline Target date 12/11/2015   Time 8   Period Weeks   Status Achieved   PT LONG TERM GOAL #2   Title The patient will imrpove ankle strength to > or equal to 4/5 for improved foot clearance during gait activities.   Baseline Target date 12/11/2015   Time 8   Period Weeks   Status Achieved   PT LONG TERM GOAL #3   Title The patient will improve gait speed from 2.20 ft/sec to > or equal to 3.2 ft/sec to demo return to full community ambulation and faster gait speed.   Baseline Target date 12/11/2015   Time 8   Period Weeks   Status Achieved   PT LONG TERM GOAL #4   Title The patient will improve Berg from 43/56 to > or equal to 52/56 to demo improved high level balance control.   Baseline Target date 12/11/2015   Time 8   Period Weeks   Status Achieved   PT LONG TERM GOAL #5   Title The patient will negotiate level/unlevel community surfaces without a device independently.   Baseline Target date 12/11/2015   Time 8   Period Weeks   Status Achieved   Additional Long Term Goals   Additional Long Term Goals Yes   PT LONG TERM GOAL #6   Title The patient will demo slow jog x 100 ft to begin to demo ability to return to  prior exercise level.   Baseline Target date 12/11/2015   Time 8   Period Weeks   Status Not Met   PT LONG TERM GOAL #7  Title Pt will improve functional gait assessment score of 28/30 to decrease fall risk and increase functional mobility    Time 4   Period Weeks   Status New   PT LONG TERM GOAL #8   Title Pt will be able to hold SLS for 30 seconds on BLEs to increase balance and decrease falls risk.     Time 4   Period Weeks   Status New               Plan - 12/11/15 0933    Clinical Impression Statement Pt reports compliance with HEP and continues to walk on the treadmill for exercise.  Pt reports tenderness on the bottom of his feet from old skin flaking off and new skin sensitivity.  Limited single leg balance and twisting motion due to increased tenderness today.  Pt progressed to 150lbs on the leg press and was able to perform single leg at 100lbs. Pt was able to perform standing marches on bosu ball today with CGA for stability. Pt required min VCs for postural stability with dynamic balance activites.  Pt continues to want to work on descending stairs reciprocally.  Added eccentric step downs into HEP for practice.  Pt would benefit from further skilled physical therapy to work on LE strength and dynamic balance to decrease foot slap and increase functional mobility.     Rehab Potential Good   PT Frequency 1x / week   PT Duration 4 weeks   PT Treatment/Interventions ADLs/Self Care Home Management;Therapeutic exercise;Therapeutic activities;Functional mobility training;Gait training;Patient/family education;Balance training;Neuromuscular re-education   PT Next Visit Plan ball toss on airex pad, balance on top of bosu, single leg bridge, squats with overhead press, ladder drills, double leg hop    PT Home Exercise Plan added supine bridges with abduction band, added singe leg stance on pillow    Consulted and Agree with Plan of Care Patient      Patient will benefit from  skilled therapeutic intervention in order to improve the following deficits and impairments:  Abnormal gait, Decreased range of motion, Difficulty walking, Pain, Decreased endurance, Impaired sensation, Decreased strength, Decreased mobility, Decreased balance  Visit Diagnosis: Muscle weakness (generalized)  Other abnormalities of gait and mobility     Problem List Patient Active Problem List   Diagnosis Date Noted  . Critical illness polyneuropathy ([place]) 09/30/2015  . Debility 09/24/2015  . Weakness   . Abnormality of gait   . Pain   . Neuropathic pain   . Anxiety state   . Yeast infection   . Anemia of chronic disease   . Hypokalemia   . Peripheral edema   . ATN (acute tubular necrosis) ([place])   . Scrotal edema   . Thrombocytopenia ([place])   . MODS (multiple organ dysfunction syndrome)   . Endotracheally intubated   . Hypovolemic shock ([place])   . Acute respiratory failure ([place])   . AKI (acute kidney injury) ([place])   . Lactic acidosis   . Hypoxia 08/27/2015  . Rhabdomyolysis 08/27/2015  . Hyperkalemia 08/27/2015  . ARF (acute renal failure) ([place]) 08/27/2015  . Routine general medical examination at a health care facility 08/23/2014  . Advance care planning 08/23/2014  . Meniere disease 04/08/2011  . ALLERGIC RHINITIS, SEASONAL 12/02/2007   Stacy Gardner, SPT  This entire session was performed under direct supervision and direction of a licensed therapist/therapist assistant . I have personally read, edited and approve of the note as written.  Trotter,Margaret PT,DPT 12/11/2015, 10:28 AM  [place] MAIN [facility] SERVICES [address] [place], [place], [address] Phone: [phone]   Fax:  [phone]  Name: MIGUEL CHRISTIANA MRN: [id number] Date of Birth: [birth year]

## 2015-12-12 ENCOUNTER — Ambulatory Visit
Admission: RE | Admit: 2015-12-12 | Discharge: 2015-12-12 | Disposition: A | Discharge status: Home / Self Care | Payer: BLUE CROSS/BLUE SHIELD | Source: Ambulatory Visit | Attending: Physical Medicine & Rehabilitation | Admitting: Physical Medicine & Rehabilitation

## 2015-12-12 DIAGNOSIS — G9050 Complex regional pain syndrome I, unspecified: Secondary | ICD-10-CM | POA: Diagnosis present

## 2015-12-12 DIAGNOSIS — R937 Abnormal findings on diagnostic imaging of other parts of musculoskeletal system: Secondary | ICD-10-CM | POA: Diagnosis not present

## 2015-12-12 DIAGNOSIS — IMO0002 Reserved for concepts with insufficient information to code with codable children: Secondary | ICD-10-CM

## 2015-12-17 ENCOUNTER — Ambulatory Visit: Payer: BLUE CROSS/BLUE SHIELD | Admitting: Physical Therapy

## 2015-12-17 DIAGNOSIS — M6281 Muscle weakness (generalized): Secondary | ICD-10-CM | POA: Diagnosis not present

## 2015-12-17 DIAGNOSIS — R2689 Other abnormalities of gait and mobility: Secondary | ICD-10-CM

## 2015-12-17 NOTE — Therapy (Signed)
[employer] MAIN [facility] SERVICES [address] [place], [place], [address] Phone: [phone]   Fax:  [phone]  Physical Therapy Treatment  Patient Details  Name: John Hoover MRN: [id number] Date of Birth: 05/12/1966 Referring Provider: Delice Lesch, MD  Encounter Date: 12/17/2015      PT End of Session - 12/17/15 0843    Visit Number 17   Number of Visits 22   Date for PT Re-Evaluation 01/04/16   Authorization Type 30 visit limit   PT Start Time 0800   PT Stop Time 0845   PT Time Calculation (min) 45 min   Equipment Utilized During Treatment Gait belt   Activity Tolerance Patient tolerated treatment well;No increased pain   Behavior During Therapy [facility] for tasks assessed/performed      Past Medical History  Diagnosis Date  . Meniere's disease   . Flu   . Rhabdomyolysis   . Respiratory failure ([place])     VDRF flu 2017  . Anemia   . Neuropathy ([place])   . Physical deconditioning   . AKI (acute kidney injury) ([place])   . Critical illness polyneuropathy [facility])     Past Surgical History  Procedure Laterality Date  . Nasal sinus surgery      There were no vitals filed for this visit.      Subjective Assessment - 12/17/15 0803    Subjective Pt reports he still has the tenderness and sores on the bottom of his feet.  He also reports that he has been incline walking on his treadmill each morning without increase in pain.  Thursday was the last full dosage of the steroids, the swelling remains down.     Limitations Walking   Patient Stated Goals Return to prior level of functioning including running (did 2 miles /day).   Currently in Pain? Yes   Pain Score 4    Pain Location Foot   Pain Orientation Right;Left   Pain Descriptors / Indicators Tender   Pain Onset More than a month ago     Treatment Nustep x 4 mins BLEs, level 3 (unbilled) Quantum leg press, 130# BLEs, 2 sets x 10 reps, 90# Single leg 1 set x 10 reps each LE, min  VCs for eccentric control  Eccentric single leg calf raises on 4 inch step, 1 set x 10 BLEs, min VCs for increased dorsiflexion ROM  Abduction walking over beam forwards/backwards x 2 laps each, min VCs to increase knee flexion and crouched posture  Oblique twists standing on bosu ball, 2 sets x 10 reps, min A for stability, min VCs to increase ROM  Resisted standing hip flexion with red theraband resistance, 2 sets x 20 reps  Single leg line hops, 2 sets x10 reps, min VCs for upright posture and close supervision for safety  Double leg hop deferred due to decreased sensation on palmar surface of feet  Baps board anterior/posterior taps, BLEs x 10, 1 HHA for stability, min VCs for feet positioning  Supine bridges, 2 sets x 10 reps with feet supported on bosu ball, min VCs for eccentric control                                PT Education - 12/17/15 0842    Education provided Yes   Education Details reemphasized HEP, proper shoes on the beach    Person(s) Educated Patient   Methods Explanation;Demonstration;Verbal cues  Comprehension Verbalized understanding;Returned demonstration;Verbal cues required          PT Short Term Goals - 11/20/15 1400    PT SHORT TERM GOAL #1   Title The patient will return demo HEP for balance, LE strength (ankles), and general mobility.   Baseline Target date 11/11/2015   Time 4   Period Weeks   Status On-going   PT SHORT TERM GOAL #2   Title The patient will demonstrate correct sequencing of SPC for household mobility to improve safety with device.   Baseline Target date 11/11/2015   Time 4   Period Weeks   Status Achieved   PT SHORT TERM GOAL #3   Title The patient will negotiate 4 steps without a handrail modified indep to demo improved community surface negotiation.   Baseline Target date 11/11/2015   Time 4   Period Weeks   Status Achieved   PT SHORT TERM GOAL #4   Title The patient will negotiate level community  surfaces wth SPC modified indep x 1000 ft for dec'd dependence on RW.   Baseline Target date 11/11/2015   Time 4   Period Weeks   Status Achieved   PT SHORT TERM GOAL #5   Title The patient will improve Berg balance score from 43/56 up to 48/56 to demo dec'd risk for falls.   Baseline Target date 11/11/2015   Time 4   Period Weeks   Status Achieved           PT Long Term Goals - 12/07/15 1114    PT LONG TERM GOAL #1   Title The patient will improve ABC scale by 15% (baseline 63%).   Baseline Target date 12/11/2015   Time 8   Period Weeks   Status Achieved   PT LONG TERM GOAL #2   Title The patient will imrpove ankle strength to > or equal to 4/5 for improved foot clearance during gait activities.   Baseline Target date 12/11/2015   Time 8   Period Weeks   Status Achieved   PT LONG TERM GOAL #3   Title The patient will improve gait speed from 2.20 ft/sec to > or equal to 3.2 ft/sec to demo return to full community ambulation and faster gait speed.   Baseline Target date 12/11/2015   Time 8   Period Weeks   Status Achieved   PT LONG TERM GOAL #4   Title The patient will improve Berg from 43/56 to > or equal to 52/56 to demo improved high level balance control.   Baseline Target date 12/11/2015   Time 8   Period Weeks   Status Achieved   PT LONG TERM GOAL #5   Title The patient will negotiate level/unlevel community surfaces without a device independently.   Baseline Target date 12/11/2015   Time 8   Period Weeks   Status Achieved   Additional Long Term Goals   Additional Long Term Goals Yes   PT LONG TERM GOAL #6   Title The patient will demo slow jog x 100 ft to begin to demo ability to return to prior exercise level.   Baseline Target date 12/11/2015   Time 8   Period Weeks   Status Not Met   PT LONG TERM GOAL #7   Title Pt will improve functional gait assessment score of 28/30 to decrease fall risk and increase functional mobility    Time 4   Period Weeks   Status  New   PT LONG  TERM GOAL #8   Title Pt will be able to hold SLS for 30 seconds on BLEs to increase balance and decrease falls risk.     Time 4   Period Weeks   Status New               Plan - 12/17/15 0845    Clinical Impression Statement Pt continues to demonstrate progress with each session.  He has been walking on the treadmill every morning on an incline.  He does report tenderness and sores on the bottom of his feet that become increasingly sensitve throughout the day.  Pt was able to progress dynamic balance activites on bosu ball and hip strengthening without LOB.  Pt performed leg press at #130.  He continues to report difficulty with ankle mobility regarding eccentric calf raises.  He would benefit from further skilled physical therapy to continue to work on dynamic balance and LE strength to decrease foot slap during ambulation.     Rehab Potential Good   PT Frequency 1x / week   PT Duration 4 weeks   PT Treatment/Interventions ADLs/Self Care Home Management;Therapeutic exercise;Therapeutic activities;Functional mobility training;Gait training;Patient/family education;Balance training;Neuromuscular re-education   PT Next Visit Plan ball toss on airex pad, balance on top of bosu, single leg bridge, squats with overhead press, ladder drills, double leg hop    PT Home Exercise Plan see pt instructions    Consulted and Agree with Plan of Care Patient      Patient will benefit from skilled therapeutic intervention in order to improve the following deficits and impairments:  Abnormal gait, Decreased range of motion, Difficulty walking, Pain, Decreased endurance, Impaired sensation, Decreased strength, Decreased mobility, Decreased balance  Visit Diagnosis: Muscle weakness (generalized)  Other abnormalities of gait and mobility     Problem List Patient Active Problem List   Diagnosis Date Noted  . Critical illness polyneuropathy ([place]) 09/30/2015  . Debility 09/24/2015  .  Weakness   . Abnormality of gait   . Pain   . Neuropathic pain   . Anxiety state   . Yeast infection   . Anemia of chronic disease   . Hypokalemia   . Peripheral edema   . ATN (acute tubular necrosis) ([place])   . Scrotal edema   . Thrombocytopenia ([place])   . MODS (multiple organ dysfunction syndrome)   . Endotracheally intubated   . Hypovolemic shock ([place])   . Acute respiratory failure ([place])   . AKI (acute kidney injury) ([place])   . Lactic acidosis   . Hypoxia 08/27/2015  . Rhabdomyolysis 08/27/2015  . Hyperkalemia 08/27/2015  . ARF (acute renal failure) ([place]) 08/27/2015  . Routine general medical examination at a health care facility 08/23/2014  . Advance care planning 08/23/2014  . Meniere disease 04/08/2011  . ALLERGIC RHINITIS, SEASONAL 12/02/2007   Stacy Gardner, SPT  This entire session was performed under direct supervision and direction of a licensed therapist/therapist assistant . I have personally read, edited and approve of the note as written.  Trotter,Margaret PT, DPT 12/17/2015, 11:00 AM  [place] MAIN [facility] SERVICES [address] [place], [place], [address] Phone: [phone]   Fax:  [phone]  Name: John Hoover MRN: [id number] Date of Birth: 05/23/1966

## 2015-12-19 ENCOUNTER — Ambulatory Visit: Payer: BLUE CROSS/BLUE SHIELD | Admitting: Physical Medicine & Rehabilitation

## 2015-12-21 ENCOUNTER — Encounter
Payer: BLUE CROSS/BLUE SHIELD | Attending: Physical Medicine & Rehabilitation | Admitting: Physical Medicine & Rehabilitation

## 2015-12-21 ENCOUNTER — Encounter: Payer: Self-pay | Admitting: Physical Medicine & Rehabilitation

## 2015-12-21 VITALS — BP 124/89 | HR 71 | Resp 16

## 2015-12-21 DIAGNOSIS — IMO0002 Reserved for concepts with insufficient information to code with codable children: Secondary | ICD-10-CM

## 2015-12-21 DIAGNOSIS — H8109 Meniere's disease, unspecified ear: Secondary | ICD-10-CM | POA: Diagnosis not present

## 2015-12-21 DIAGNOSIS — G4700 Insomnia, unspecified: Secondary | ICD-10-CM | POA: Insufficient documentation

## 2015-12-21 DIAGNOSIS — R269 Unspecified abnormalities of gait and mobility: Secondary | ICD-10-CM | POA: Insufficient documentation

## 2015-12-21 DIAGNOSIS — D649 Anemia, unspecified: Secondary | ICD-10-CM | POA: Insufficient documentation

## 2015-12-21 DIAGNOSIS — R200 Anesthesia of skin: Secondary | ICD-10-CM | POA: Insufficient documentation

## 2015-12-21 DIAGNOSIS — N170 Acute kidney failure with tubular necrosis: Secondary | ICD-10-CM | POA: Diagnosis not present

## 2015-12-21 DIAGNOSIS — Z79899 Other long term (current) drug therapy: Secondary | ICD-10-CM | POA: Diagnosis not present

## 2015-12-21 DIAGNOSIS — R609 Edema, unspecified: Secondary | ICD-10-CM | POA: Diagnosis not present

## 2015-12-21 DIAGNOSIS — G6281 Critical illness polyneuropathy: Secondary | ICD-10-CM | POA: Diagnosis not present

## 2015-12-21 DIAGNOSIS — M792 Neuralgia and neuritis, unspecified: Secondary | ICD-10-CM | POA: Diagnosis not present

## 2015-12-21 DIAGNOSIS — G894 Chronic pain syndrome: Secondary | ICD-10-CM | POA: Diagnosis not present

## 2015-12-21 DIAGNOSIS — M7989 Other specified soft tissue disorders: Secondary | ICD-10-CM | POA: Diagnosis not present

## 2015-12-21 DIAGNOSIS — G9050 Complex regional pain syndrome I, unspecified: Secondary | ICD-10-CM | POA: Diagnosis not present

## 2015-12-21 DIAGNOSIS — Z5181 Encounter for therapeutic drug level monitoring: Secondary | ICD-10-CM | POA: Diagnosis not present

## 2015-12-21 DIAGNOSIS — J9690 Respiratory failure, unspecified, unspecified whether with hypoxia or hypercapnia: Secondary | ICD-10-CM | POA: Insufficient documentation

## 2015-12-21 DIAGNOSIS — R61 Generalized hyperhidrosis: Secondary | ICD-10-CM | POA: Diagnosis not present

## 2015-12-21 MED ORDER — PREGABALIN 100 MG PO CAPS: 100.0000 mg | ORAL_CAPSULE | Freq: Three times a day (TID) | ORAL | Status: DC

## 2015-12-21 NOTE — Progress Notes (Addendum)
Subjective:    Patient ID: John Hoover, male    DOB: 1965/06/25, 50 y.o.   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  He was seen in clinic and discharged to PCP, Last clinic visit was 11/28/15.   Since last visit  B/l feet R>L.  Quality has improved to numbness/tingling.  Constant pain.  Oxy improves the pain. Ambulating exacerbates the pain.  Still has edema (reduced).  Lyrica 150 BID has been helping.  Not using Elavil anymore due to lethargy.  He had the xray that was ordered on last visit. He states he is doing much better since last visit.  He has started exercising again.  He is still in PT.  The pain gets worse toward the end of the day.  He will complete his steroids in 4-5 days.   Pain Inventory Average Pain 7 Pain Right Now 5 My pain is stabbing and tingling  In the last 24 hours, has pain interfered with the following? General activity 2 Relation with others 0 Enjoyment of life 5 What TIME of day is your pain at its worst? evening Sleep (in general) Good  Pain is worse with: sitting and standing Pain improves with: rest and medication Relief from Meds: 7  Mobility walk without assistance how many minutes can you walk? 45 ability to climb steps?  yes do you drive?  yes Do you have any goals in this area?  yes  Function employed # of hrs/week 60 what is your job? [employer]  Neuro/Psych numbness tingling  Prior Studies Any changes since last visit?  no  Physicians involved in your care Any changes since last visit?  no   Family History  Problem Relation Age of Onset  . Hypertension Mother   . Hypertension Father   . Colon cancer Neg Hx   . Prostate cancer Neg Hx    Social History   Social History  . Marital Status: Married    Spouse Name: N/A  . Number of Children: N/A  . Years of Education: N/A   Occupational History  . Works in Product/process development scientist    Social History Main Topics  . Smoking status:  Never Smoker   . Smokeless tobacco: Never Used  . Alcohol Use: Yes     Comment: weendends  . Drug Use: No  . Sexual Activity: Not Asked   Other Topics Concern  . None   Social History Narrative   1 daughter (who has DM1) and 1 son   Married 25+ years   Past Surgical History  Procedure Laterality Date  . Nasal sinus surgery     Past Medical History  Diagnosis Date  . Meniere's disease   . Flu   . Rhabdomyolysis   . Respiratory failure ([facility])     VDRF flu 2017  . Anemia   . Neuropathy ([facility])   . Physical deconditioning   . AKI (acute kidney injury) ([facility])   . Critical illness polyneuropathy ([facility])    BP 124/89 mmHg  Pulse 71  Resp 16  SpO2 99%  Opioid Risk Score:   Fall Risk Score:  `1  Depression screen PHQ 2/9  Depression screen [facility] 2/9 11/28/2015 10/18/2015  Decreased Interest 0 1  Down, Depressed, Hopeless 0 0  PHQ - 2 Score 0 1   Review of Systems  Constitutional: Positive for diaphoresis.  HENT: Negative.   Eyes: Negative.   Respiratory: Negative.   Cardiovascular:  Positive for leg swelling.  Gastrointestinal: Negative.   Endocrine: Negative.   Genitourinary: Negative.   Musculoskeletal: Negative.   Skin: Negative.   Allergic/Immunologic: Negative.   Neurological: Positive for numbness.       Gait Instability  Hematological: Negative.   All other systems reviewed and are negative.     Objective:   Physical Exam Constitutional: He appears well-developed and well-nourished. No distress.  HENT:  Normocephalic and atraumatic.   Eyes: Conjunctivae and EOM are normal.    Cardiovascular: Regular Rate and rhythm.    Respiratory: Effort normal. No respiratory distress. Clear to ausculation.   GI: Soft. Bowel sounds are normal. He exhibits no distension. There is no tenderness.  Musculoskeletal: He exhibits edema right foot.  ROM improving.   Gait: Near normal Neurological: He is alert and oriented.  Motor: 5-/5 throughout, except bilateral ankle dorsi  flexion: 4+/5.  Skin: Erythema at tips of RLE digits. RLE warmer than LLE. Ischemic changes to nearly resolved. Psychiatric: His behavior is normal. Thought content normal. Cognition and memory are normal.    Assessment & Plan:  50 year old male presents for CRPS.  1. CRPS  Has tried Gabapetin 1200 TID, with minimal benefit,   Lyrica 150 BID, changed to 100 TID.  Cont therapies  Encouraged PROM to b/l ankles  Xray reviewed, unremarkable.  Will order triple phase bone scan RLE  Cont steroid taper  2. Sleep disturbance  Improved, now off medication   3. Peripheral edema:   Likely secondary to CRPS  Cont elevations   Cont limit salt intake  Encouraged use of ice if sensate    4. Abnormality of gait  Secondary to numbness and pain  Denies falls  No assistive device

## 2015-12-24 ENCOUNTER — Encounter: Payer: BLUE CROSS/BLUE SHIELD | Admitting: Physical Therapy

## 2015-12-26 ENCOUNTER — Ambulatory Visit: Payer: BLUE CROSS/BLUE SHIELD | Admitting: Physical Medicine & Rehabilitation

## 2015-12-26 ENCOUNTER — Encounter: Payer: Self-pay | Admitting: Physical Medicine & Rehabilitation

## 2016-01-01 ENCOUNTER — Encounter: Payer: Self-pay | Admitting: Physical Therapy

## 2016-01-01 ENCOUNTER — Ambulatory Visit: Payer: BLUE CROSS/BLUE SHIELD | Attending: Physical Medicine & Rehabilitation | Admitting: Physical Therapy

## 2016-01-01 DIAGNOSIS — M6281 Muscle weakness (generalized): Secondary | ICD-10-CM

## 2016-01-01 DIAGNOSIS — R2689 Other abnormalities of gait and mobility: Secondary | ICD-10-CM

## 2016-01-01 NOTE — Patient Instructions (Addendum)
Ball-Squeeze, Medial Hip    Use resistance band around legs instead of ball.  2 sets x 10 reps.  http://bt.exer.us/110   Copyright  VHI. All rights reserved.  (Clinic) Balance: With Hip Abduction - Unilateral Stance (Resist)    Band around both legs, keep legs straight and pull leg out to side.  Each leg 2 sets x 10 reps   Copyright  VHI. All rights reserved.  Ball-Squeeze, Medial Hip    http://bt.exer.[email or web address]   Copyright  VHI. All rights reserved.  Adductor Side Lunge   Side lunge, keep stationary leg straight.  2 sets x 10 reps both legs   Copyright  VHI. All rights reserved.  Abductor Strength: Bridge Pose (Strap)    Make strap wide enough to brace knees at hip width. Press into strap with knees. 2 sets x 10 reps   Copyright  VHI. All rights reserved.  (Clinic) Balance: With Hip Abduction - Unilateral Stance (Resist)   Copyright  VHI. All rights reserved.  Balance, Proprioception: Hip Flexion With Tubing    Band around both legs. Keep both legs straight, pull band forward, 2 sets x 10 reps both legs   http://cc.exer.[email or web address]   Copyright  VHI. All rights reserved.  Balance, Proprioception: Hip Extension With Tubing    Band around both legs, keep legs straight pull leg backwards   http://cc.exer.[email or web address]   Copyright  VHI. All rights reserved.  Ankle: Calf Raise    Up with both feet, and down with one foot slowly, 2 sets x 10 reps    Don't forget your band ankle exercises.   Copyright  VHI. All rights reserved.

## 2016-01-01 NOTE — Therapy (Signed)
[place] MAIN [facility] SERVICES [address] [place], [place], [address] Phone: [phone]   Fax:  [phone]  Physical Therapy Treatment/Discharge Note   Patient Details  Name: John Hoover MRN: [id number] Date of Birth: [birth year] Referring Provider: Delice Lesch, MD  Encounter Date: 01/01/2016      PT End of Session - 01/01/16 0934    Visit Number 18   Number of Visits 22   Date for PT Re-Evaluation 01/04/16   Authorization Type 30 visit limit   PT Start Time 0846   PT Stop Time 0930   PT Time Calculation (min) 44 min   Equipment Utilized During Treatment Gait belt   Activity Tolerance Patient tolerated treatment well;No increased pain   Behavior During Therapy WFL for tasks assessed/performed      Past Medical History:  Diagnosis Date  . AKI (acute kidney injury) ([place])   . Anemia   . 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 Flu   . Meniere's disease   . Neuropathy ([place])   . Physical deconditioning   . Respiratory failure ([place])    VDRF flu 2017  . Rhabdomyolysis     Past Surgical History:  Procedure Laterality Date  . NASAL SINUS SURGERY      There were no vitals filed for this visit.      Subjective Assessment - 01/01/16 0932    Subjective Pt reports that overall he is doing well but contiues to have pain.  He did start back to work at full duty for the first time yesterday.     Limitations Walking   Patient Stated Goals Return to prior level of functioning including running (did 2 miles /day).   Currently in Pain? Yes   Pain Score 6    Pain Location Foot   Pain Orientation Right;Left   Pain Descriptors / Indicators Burning   Pain Type Acute pain   Pain Onset More than a month ago            [facility] PT Assessment - 01/01/16 0001      Sensation   Additional Comments 5.07 monofilament testing on bilateral feet, pt continues to demonstrate some inconsistencies with light touch around toes both plantar and  dorsal surfaces of R and L foot.  Able to detect around ankle, forefoot and heel, difficulty detecting arches and lateral border of foot      AROM   Right Ankle Dorsiflexion 3   Right Ankle Eversion 13   Left Ankle Dorsiflexion 15   Left Ankle Eversion 25     Strength   Right Hip Flexion 4+/5   Left Hip Flexion 4+/5   Right Knee Flexion 5/5   Right Knee Extension 5/5   Left Knee Flexion 5/5   Left Knee Extension 5/5   Right Ankle Dorsiflexion 4/5   Left Ankle Dorsiflexion 4/5     High Level Balance   High Level Balance Comments able to perform SLS for 30 seconds on BLEs without LOB or UE assist      Functional Gait  Assessment   Gait Level Surface Walks 20 ft in less than 5.5 sec, no assistive devices, good speed, no evidence for imbalance, normal gait pattern, deviates no more than 6 in outside of the 12 in walkway width.   Change in Gait Speed Able to smoothly change walking speed without loss of balance or gait deviation. Deviate no more than 6 in outside of the 12 in walkway width.  Gait with Horizontal Head Turns Performs head turns smoothly with no change in gait. Deviates no more than 6 in outside 12 in walkway width   Gait with Vertical Head Turns Performs head turns with no change in gait. Deviates no more than 6 in outside 12 in walkway width.   Gait and Pivot Turn Pivot turns safely within 3 sec and stops quickly with no loss of balance.   Step Over Obstacle Is able to step over 2 stacked shoe boxes taped together (9 in total height) without changing gait speed. No evidence of imbalance.   Gait with Narrow Base of Support Ambulates 7-9 steps.   Gait with Eyes Closed Walks 20 ft, uses assistive device, slower speed, mild gait deviations, deviates 6-10 in outside 12 in walkway width. Ambulates 20 ft in less than 9 sec but greater than 7 sec.   Ambulating Backwards Walks 20 ft, no assistive devices, good speed, no evidence for imbalance, normal gait   Steps Alternating feet,  no rail.   Total Score 28     Treatment Assessed ankle ROM, LE strength, and sensation of bilateral feet for progress Performed FGA to assess goal, pt scored 28/30 with most difficulty heel toe walking and ambulating with eyes closed  Assessed SLS on BLEs, pt was able to perform for 30 seconds on BLEs which met goal   Extensive education, demonstration and teach back for HEP.  Resisted ankle mobility, squats with abduction resistance, side lunge, 4 way hip, bridges and eccentric calf raises were included.  Pt was able to perform safely and with good form in order to do at home.  Instructed to call with any questions/concerns.   Educated on elevating feet throughout work day and alternating days walking on the treadmill due to transitioning into full job requirements                          PT Education - 01/01/16 0933    Education provided Yes   Education Details expanded HEP,  elevating for swelling control, alternating days on the treadmill    Person(s) Educated Patient   Methods Demonstration;Verbal cues   Comprehension Verbalized understanding;Returned demonstration;Verbal cues required          PT Short Term Goals - 01/01/16 0941      PT SHORT TERM GOAL #1   Title The patient will return demo HEP for balance, LE strength (ankles), and general mobility.   Baseline Target date 11/11/2015   Time 4   Period Weeks   Status Achieved     PT SHORT TERM GOAL #2   Title The patient will demonstrate correct sequencing of SPC for household mobility to improve safety with device.   Baseline Target date 11/11/2015   Time 4   Period Weeks   Status Achieved     PT SHORT TERM GOAL #3   Title The patient will negotiate 4 steps without a handrail modified indep to demo improved community surface negotiation.   Baseline Target date 11/11/2015   Time 4   Period Weeks   Status Achieved     PT SHORT TERM GOAL #4   Title The patient will negotiate level community  surfaces wth SPC modified indep x 1000 ft for dec'd dependence on RW.   Baseline Target date 11/11/2015   Time 4   Period Weeks   Status Achieved     PT SHORT TERM GOAL #5   Title The patient will  improve Berg balance score from 43/56 up to 48/56 to demo dec'd risk for falls.   Baseline Target date 11/11/2015   Time 4   Period Weeks   Status Achieved           PT Long Term Goals - 01/01/16 0941      PT LONG TERM GOAL #1   Title The patient will improve ABC scale by 15% (baseline 63%).   Baseline Target date 12/11/2015   Time 8   Period Weeks   Status Achieved     PT LONG TERM GOAL #2   Title The patient will imrpove ankle strength to > or equal to 4/5 for improved foot clearance during gait activities.   Baseline Target date 12/11/2015   Time 8   Period Weeks   Status Achieved     PT LONG TERM GOAL #3   Title The patient will improve gait speed from 2.20 ft/sec to > or equal to 3.2 ft/sec to demo return to full community ambulation and faster gait speed.   Baseline Target date 12/11/2015   Time 8   Period Weeks   Status Achieved     PT LONG TERM GOAL #4   Title The patient will improve Berg from 43/56 to > or equal to 52/56 to demo improved high level balance control.   Baseline Target date 12/11/2015   Time 8   Period Weeks   Status Achieved     PT LONG TERM GOAL #5   Title The patient will negotiate level/unlevel community surfaces without a device independently.   Baseline Target date 12/11/2015   Time 8   Period Weeks   Status Achieved     PT LONG TERM GOAL #6   Title The patient will demo slow jog x 100 ft to begin to demo ability to return to prior exercise level.   Baseline Target date 12/11/2015   Time 8   Period Weeks   Status Not Met     PT LONG TERM GOAL #7   Title Pt will improve functional gait assessment score of 28/30 to decrease fall risk and increase functional mobility    Time 4   Period Weeks   Status Achieved     PT LONG TERM GOAL #8    Title Pt will be able to hold SLS for 30 seconds on BLEs to increase balance and decrease falls risk.     Time 4   Period Weeks   Status Achieved               Plan - 01/01/16 0934    Clinical Impression Statement After a week of vacation pt continues to demonstrate progress.  He has met all of his goals outlined for him.  Pt scored a 28/30 on the FGA with difficulty heel toe walking and eyes closed walking.  Pt demonstrated increased strength grossly 4+/5 and only limited to 4/5 bilateral ankle dorsiflexion.  Pt was able to perform SLS on both feet for >30 seconds without LOB.  Pt does continue to report pain in both feet. He was advised to take rest breaks throughout the day to elevate his legs.  He contiues to walk on the treadmill every morning.  PT instructed pt to ease into the treadmill while starting full duty at work by alternating days.  He was given extensive HEP to do at home.  Pt was instructed to call if he had any questions regarding HEP or lack of progress at home.  Rehab Potential Good   PT Frequency 1x / week   PT Duration 4 weeks   PT Treatment/Interventions ADLs/Self Care Home Management;Therapeutic exercise;Therapeutic activities;Functional mobility training;Gait training;Patient/family education;Balance training;Neuromuscular re-education   PT Next Visit Plan ball toss on airex pad, balance on top of bosu, single leg bridge, squats with overhead press, ladder drills, double leg hop    PT Home Exercise Plan see pt instructions    Consulted and Agree with Plan of Care Patient      Patient will benefit from skilled therapeutic intervention in order to improve the following deficits and impairments:  Abnormal gait, Decreased range of motion, Difficulty walking, Pain, Decreased endurance, Impaired sensation, Decreased strength, Decreased mobility, Decreased balance  Visit Diagnosis: Muscle weakness (generalized)  Other abnormalities of gait and  mobility     Problem List Patient Active Problem List   Diagnosis Date Noted  . CRPS (complex regional pain syndrome) 12/21/2015  . 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 Debility 09/24/2015  . Weakness   . Abnormality of gait   . Pain   . Neuropathic pain   . Anxiety state   . Yeast infection   . Anemia of chronic disease   . Hypokalemia   . Peripheral edema   . ATN (acute tubular necrosis) ([place])   . Scrotal edema   . Thrombocytopenia ([place])   . MODS (multiple organ dysfunction syndrome)   . Endotracheally intubated   . Hypovolemic shock ([place])   . Acute respiratory failure ([place])   . AKI (acute kidney injury) ([place])   . Lactic acidosis   . Hypoxia 08/27/2015  . Rhabdomyolysis 08/27/2015  . Hyperkalemia 08/27/2015  . ARF (acute renal failure) ([place]) 08/27/2015  . Routine general medical examination at a health care facility 08/23/2014  . Advance care planning 08/23/2014  . Meniere disease 04/08/2011  . ALLERGIC RHINITIS, SEASONAL 12/02/2007   Stacy Gardner, SPT  This entire session was performed under direct supervision and direction of a licensed therapist/therapist assistant . I have personally read, edited and approve of the note as written.  Trotter,Margaret PT, DPT 01/01/2016, 11:28 AM  [place] MAIN [facility] SERVICES [address] [place], [place], [address] Phone: [phone]   Fax:  [phone]  Name: John Hoover MRN: [id number] Date of Birth: 07-Oct-1965

## 2016-01-07 ENCOUNTER — Encounter: Payer: Self-pay | Admitting: Family Medicine

## 2016-01-07 ENCOUNTER — Ambulatory Visit (INDEPENDENT_AMBULATORY_CARE_PROVIDER_SITE_OTHER): Payer: BLUE CROSS/BLUE SHIELD | Admitting: Family Medicine

## 2016-01-07 DIAGNOSIS — G6281 Critical illness polyneuropathy: Secondary | ICD-10-CM | POA: Diagnosis not present

## 2016-01-07 DIAGNOSIS — L97511 Non-pressure chronic ulcer of other part of right foot limited to breakdown of skin: Secondary | ICD-10-CM

## 2016-01-07 DIAGNOSIS — L97509 Non-pressure chronic ulcer of other part of unspecified foot with unspecified severity: Secondary | ICD-10-CM | POA: Insufficient documentation

## 2016-01-07 NOTE — Progress Notes (Signed)
Pre visit review using our clinic review tool, if applicable. No additional management support is needed unless otherwise documented below in the visit note. 

## 2016-01-07 NOTE — Progress Notes (Signed)
He is walking much better.  Still with pain.  Has seen rehab MD, with neuropathy changes a/w critical illness.  Still with some more sensation in L>R foot.  L foot is healing quicker than R foot.  Still twih dystrophic nails esp R5th nail, trimmed at OV.  With some callus on the distal R toes, slowly healing.  Still with some swelling in the R>L foot.    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  It has been a big adjustment, but he is getting by, safely.    2 weeks ago with injury to R achilles area.  Scraped it while stepping over a rail.  Not red but still a little puffy with central unroofed shallow lesion.  1.5x0.5 cm lesion.  Not fluctuant.    Meds, vitals, and allergies reviewed.   ROS: Per HPI unless specifically indicated in ROS section   nad ncat R 5th nail trimmed, was dystrophic and deviated upward With some callus on the distal R toes, slowly healing.  Still with some swelling in the R>L foot.   R achilles area with area that isn't red but still a little puffy with central unroofed shallow lesion.  1.5x0.5 cm lesion

## 2016-01-07 NOTE — Assessment & Plan Note (Signed)
1.5x0.5 cm lesion, shallow, should heal.  Covered with hydrocolloid dressing.  D/w pt.  He'll update me as needed.

## 2016-01-07 NOTE — Assessment & Plan Note (Signed)
With skin lesions slowly healing on the R toes, d/w pt.  Continue local care.

## 2016-01-07 NOTE — Patient Instructions (Signed)
Use a hydrocolloid dressing in the meantime.   Don't use peroxide.   You don't have to use neosporin.  Wash with soap and water, gently.  Take care.  Glad to see you.

## 2016-01-23 ENCOUNTER — Encounter: Payer: Self-pay | Admitting: Physical Medicine & Rehabilitation

## 2016-01-23 ENCOUNTER — Encounter
Payer: BLUE CROSS/BLUE SHIELD | Attending: Physical Medicine & Rehabilitation | Admitting: Physical Medicine & Rehabilitation

## 2016-01-23 VITALS — BP 125/84 | HR 70 | Resp 17

## 2016-01-23 DIAGNOSIS — M7989 Other specified soft tissue disorders: Secondary | ICD-10-CM | POA: Diagnosis not present

## 2016-01-23 DIAGNOSIS — R269 Unspecified abnormalities of gait and mobility: Secondary | ICD-10-CM | POA: Diagnosis not present

## 2016-01-23 DIAGNOSIS — J9690 Respiratory failure, unspecified, unspecified whether with hypoxia or hypercapnia: Secondary | ICD-10-CM | POA: Diagnosis not present

## 2016-01-23 DIAGNOSIS — R61 Generalized hyperhidrosis: Secondary | ICD-10-CM | POA: Diagnosis not present

## 2016-01-23 DIAGNOSIS — R609 Edema, unspecified: Secondary | ICD-10-CM | POA: Diagnosis not present

## 2016-01-23 DIAGNOSIS — H8109 Meniere's disease, unspecified ear: Secondary | ICD-10-CM | POA: Diagnosis not present

## 2016-01-23 DIAGNOSIS — R200 Anesthesia of skin: Secondary | ICD-10-CM | POA: Insufficient documentation

## 2016-01-23 DIAGNOSIS — G9050 Complex regional pain syndrome I, unspecified: Secondary | ICD-10-CM

## 2016-01-23 DIAGNOSIS — D649 Anemia, unspecified: Secondary | ICD-10-CM | POA: Insufficient documentation

## 2016-01-23 DIAGNOSIS — Z5181 Encounter for therapeutic drug level monitoring: Secondary | ICD-10-CM | POA: Insufficient documentation

## 2016-01-23 DIAGNOSIS — N170 Acute kidney failure with tubular necrosis: Secondary | ICD-10-CM | POA: Insufficient documentation

## 2016-01-23 DIAGNOSIS — G4700 Insomnia, unspecified: Secondary | ICD-10-CM | POA: Diagnosis not present

## 2016-01-23 DIAGNOSIS — G6281 Critical illness polyneuropathy: Secondary | ICD-10-CM | POA: Diagnosis not present

## 2016-01-23 DIAGNOSIS — IMO0002 Reserved for concepts with insufficient information to code with codable children: Secondary | ICD-10-CM

## 2016-01-23 DIAGNOSIS — G894 Chronic pain syndrome: Secondary | ICD-10-CM | POA: Insufficient documentation

## 2016-01-23 DIAGNOSIS — Z79899 Other long term (current) drug therapy: Secondary | ICD-10-CM | POA: Diagnosis not present

## 2016-01-23 DIAGNOSIS — M792 Neuralgia and neuritis, unspecified: Secondary | ICD-10-CM | POA: Diagnosis not present

## 2016-01-23 MED ORDER — PREGABALIN 150 MG PO CAPS: 150.0000 mg | ORAL_CAPSULE | Freq: Three times a day (TID) | ORAL | 1 refills | Status: DC

## 2016-01-23 MED ORDER — DULOXETINE HCL 30 MG PO CPEP: 30.0000 mg | ORAL_CAPSULE | Freq: Every day | ORAL | 1 refills | Status: DC

## 2016-01-23 NOTE — Progress Notes (Addendum)
Subjective:    Patient ID: Peterson AoWilliam E Gramm, male    DOB: 1965-06-07, 50 y.o.   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  He was seen in clinic and discharged to PCP.  He had an xray, of the RLE which was unremarkable.  He also completed high dose steroid taper. Last clinic visit 12/21/15.  At last visit his Lyrica was changed to 100 TID.  His elavil was d/ced. He has completed therapies.  He has not received his triple phase bone scan yet.  Since last visit, he has returned to work full time.  He takes minimal breaks, which was encouraged to him on last visit.  He seems to indicate more numbness than pain.   Pain Inventory Average Pain 7 Pain Right Now 7 My pain is sharp, burning and tingling  In the last 24 hours, has pain interfered with the following? General activity 5 Relation with others 5 Enjoyment of life 7 What TIME of day is your pain at its worst? evening Sleep (in general) NA  Pain is worse with: walking Pain improves with: NA Relief from Meds: na  Mobility Do you have any goals in this area?  no  Function Do you have any goals in this area?  no  Neuro/Psych numbness tingling trouble walking  Prior Studies Any changes since last visit?  no  Physicians involved in your care Any changes since last visit?  no   Family History  Problem Relation Age of Onset  . Hypertension Mother   . Hypertension Father   . Colon cancer Neg Hx   . Prostate cancer Neg Hx    Social History   Social History  . Marital status: Married    Spouse name: N/A  . Number of children: N/A  . Years of education: N/A   Occupational History  . Works in Product/process development scientistconcrete/ building    Social History Main Topics  . Smoking status: Never Smoker  . Smokeless tobacco: Never Used  . Alcohol use Yes     Comment: weendends  . Drug use: No  . Sexual activity: Not Asked   Other Topics Concern  . None   Social History Narrative   1 daughter  (who has DM1) and 1 son   Married 25+ years   Past Surgical History:  Procedure Laterality Date  . NASAL SINUS SURGERY     Past Medical History:  Diagnosis Date  . AKI (acute kidney injury) ([facility])   . Anemia   . Critical illness polyneuropathy ([facility])   . Flu   . Meniere's disease   . Neuropathy ([facility])   . Physical deconditioning   . Respiratory failure ([facility])    VDRF flu 2017  . Rhabdomyolysis    BP 125/84   Pulse 70   Resp 17   SpO2 97%   Opioid Risk Score:   Fall Risk Score:  `1  Depression screen PHQ 2/9  [facility] 11/28/2015 10/18/2015  Decreased Interest 0 1  Down, Depressed, Hopeless 0 0  PHQ - 2 Score 0 1   Review of Systems  Musculoskeletal: Positive for gait problem.  Neurological: Positive for numbness.       Tingling    All other systems reviewed and are negative.     Objective:   Physical Exam Constitutional: He appears well-developed and well-nourished. No distress.  HENT:  Normocephalic and atraumatic.   Eyes: Conjunctivae and EOM are  normal.    Cardiovascular: Regular Rate and rhythm.    Respiratory: Effort normal. No respiratory distress. Clear to ausculation.   GI: Soft. Bowel sounds are normal. He exhibits no distension. There is no tenderness.  Musculoskeletal:  He exhibits edema in bilateral feet, R>L.  Decreased ROM b/l ankles R>L. No tenderness. Gait: Mild steppage gait, significantly improved Neurological: He is alert and oriented.  Motor: 5/5 throughout Skin: Erythema at tips of RLE digits. Ischemic changes to mainly to toes (improved); fingertips resolved Psychiatric: His behavior is normal. Thought content normal. Cognition and memory are normal.    Assessment & Plan:  50 year old male presents for neuropathic pain.  1. CRPS  Has tried Gabapetin 1200 TID, with minimal benefit   Completed steroid taper with great benefit, discussed option of trial of repeat dose pack, but will defer for time being  Pt much more  functional now, working full time, rarely with breaks.  Educated pt on necessity of breaks.  His pain is likely unchanged because he is significantly more functional   Will increase Lyrica to 150 TID.   Cont therapies  Cont ROM to b/l ankles  Xray reviewed, unremarkable.    Encouraged to follow up with triple phase bone scan RLE  Will order Cymbalta 30mg   Will consider sympathetic block in future per discussion with patient  2. Sleep disturbance  Improved, now off medication  3. Peripheral edema:   Secondary to CRPS  Cont elevation  Cont limit salt intake  Encouraged use of ice if sensate                    4. Abnormality of gait  Secondary to numbness and pain  Denies falls  No assistive device

## 2016-01-25 ENCOUNTER — Other Ambulatory Visit: Payer: Self-pay | Admitting: Family Medicine

## 2016-01-25 ENCOUNTER — Other Ambulatory Visit (INDEPENDENT_AMBULATORY_CARE_PROVIDER_SITE_OTHER): Payer: BLUE CROSS/BLUE SHIELD

## 2016-01-25 DIAGNOSIS — Z13220 Encounter for screening for lipoid disorders: Secondary | ICD-10-CM

## 2016-01-25 DIAGNOSIS — M792 Neuralgia and neuritis, unspecified: Secondary | ICD-10-CM | POA: Diagnosis not present

## 2016-01-25 LAB — COMPREHENSIVE METABOLIC PANEL
ALK PHOS: 50 U/L (ref 39–117)
ALT: 17 U/L (ref 0–53)
AST: 16 U/L (ref 0–37)
Albumin: 4 g/dL (ref 3.5–5.2)
BILIRUBIN TOTAL: 0.3 mg/dL (ref 0.2–1.2)
BUN: 20 mg/dL (ref 6–23)
CO2: 28 meq/L (ref 19–32)
Calcium: 9 mg/dL (ref 8.4–10.5)
Chloride: 109 mEq/L (ref 96–112)
Creatinine, Ser: 1.32 mg/dL (ref 0.40–1.50)
GFR: 60.46 mL/min (ref >60.00)
GLUCOSE: 94 mg/dL (ref 70–99)
Potassium: 4.7 mEq/L (ref 3.5–5.1)
SODIUM: 142 meq/L (ref 135–145)
TOTAL PROTEIN: 6.6 g/dL (ref 6.0–8.3)

## 2016-01-25 LAB — CBC WITH DIFFERENTIAL/PLATELET
BASOS ABS: 0.1 10*3/uL (ref 0.0–0.1)
Basophils Relative: 0.7 % (ref 0.0–3.0)
EOS ABS: 0.3 10*3/uL (ref 0.0–0.7)
Eosinophils Relative: 3.6 % (ref 0.0–5.0)
HCT: 38.3 % — ABNORMAL LOW (ref 39.0–52.0)
Hemoglobin: 13 g/dL (ref 13.0–17.0)
LYMPHS ABS: 2.5 10*3/uL (ref 0.7–4.0)
Lymphocytes Relative: 35.1 % (ref 12.0–46.0)
MCHC: 34 g/dL (ref 30.0–36.0)
MCV: 91 fl (ref 78.0–100.0)
MONO ABS: 0.8 10*3/uL (ref 0.1–1.0)
Monocytes Relative: 10.6 % (ref 3.0–12.0)
NEUTROS PCT: 50 % (ref 43.0–77.0)
Neutro Abs: 3.6 10*3/uL (ref 1.4–7.7)
Platelets: 198 10*3/uL (ref 150.0–400.0)
RBC: 4.21 Mil/uL — AB (ref 4.22–5.81)
RDW: 13.6 % (ref 11.5–15.5)
WBC: 7.1 10*3/uL (ref 4.0–10.5)

## 2016-01-25 LAB — LIPID PANEL
CHOL/HDL RATIO: 5
Cholesterol: 209 mg/dL — ABNORMAL HIGH (ref 0–200)
HDL: 44.9 mg/dL (ref >39.00)
LDL Cholesterol: 147 mg/dL — ABNORMAL HIGH (ref 0–99)
NONHDL: 163.61
Triglycerides: 83 mg/dL (ref 0.0–149.0)
VLDL: 16.6 mg/dL (ref 0.0–40.0)

## 2016-01-28 ENCOUNTER — Encounter: Payer: Self-pay | Admitting: Family Medicine

## 2016-01-28 ENCOUNTER — Ambulatory Visit (INDEPENDENT_AMBULATORY_CARE_PROVIDER_SITE_OTHER): Payer: BLUE CROSS/BLUE SHIELD | Admitting: Family Medicine

## 2016-01-28 VITALS — BP 122/88 | HR 65 | Temp 98.2°F | Wt 209.8 lb

## 2016-01-28 DIAGNOSIS — Z0001 Encounter for general adult medical examination with abnormal findings: Secondary | ICD-10-CM

## 2016-01-28 DIAGNOSIS — L989 Disorder of the skin and subcutaneous tissue, unspecified: Secondary | ICD-10-CM

## 2016-01-28 DIAGNOSIS — R609 Edema, unspecified: Secondary | ICD-10-CM

## 2016-01-28 DIAGNOSIS — L97511 Non-pressure chronic ulcer of other part of right foot limited to breakdown of skin: Secondary | ICD-10-CM

## 2016-01-28 DIAGNOSIS — Z1211 Encounter for screening for malignant neoplasm of colon: Secondary | ICD-10-CM

## 2016-01-28 DIAGNOSIS — Z23 Encounter for immunization: Secondary | ICD-10-CM | POA: Diagnosis not present

## 2016-01-28 DIAGNOSIS — R6889 Other general symptoms and signs: Secondary | ICD-10-CM

## 2016-01-28 DIAGNOSIS — M792 Neuralgia and neuritis, unspecified: Secondary | ICD-10-CM

## 2016-01-28 NOTE — Assessment & Plan Note (Signed)
D/w pt not to start compression stockings at this point.  He agrees.

## 2016-01-28 NOTE — Assessment & Plan Note (Signed)
Refer to derm  ?

## 2016-01-28 NOTE — Progress Notes (Signed)
Pre visit review using our clinic review tool, if applicable. No additional management support is needed unless otherwise documented below in the visit note. 

## 2016-01-28 NOTE — Progress Notes (Signed)
CPE- See plan.  Routine anticipatory guidance given to patient.  See health maintenance. Tetanus ~2010. Flu today  PNA not due  Shingles not due D/w patient [id number]:options for colon cancer screening, including IFOB vs. colonoscopy.  Risks and benefits of both were discussed and patient voiced understanding.  Pt elects for: IFOB.  Prostate cancer screening and PSA options (with potential risks and benefits of testing vs not testing) were discussed along with recent recs/guidelines.  He declined testing PSA at this point.  He'll consider testing in the meantime.   Living will d/w pt.  Wife designated if patient were incapacitated.   Diet and exercise d/w pt.  Walking more, with pain from the neuropathy.  He is working on diet, d/w pt about diet with his work schedule.   HIV and HCV prev neg, d/w pt.    Neuropathy.  Still with pain.  Less swelling after steroid treatment.  Skin on feet has healed in the meantime.  Has seen rehab clinic in the meantime.    PMH and SH reviewed  Meds, vitals, and allergies reviewed.   ROS: Per HPI.  Unless specifically indicated otherwise in HPI, the patient denies:  General: fever. Eyes: acute vision changes ENT: sore throat Cardiovascular: chest pain Respiratory: SOB GI: vomiting GU: dysuria Musculoskeletal: acute back pain Derm: acute rash Neuro: acute motor dysfunction Psych: worsening mood Endocrine: polydipsia Heme: bleeding Allergy: hayfever  GEN: nad, alert and oriented HEENT: mucous membranes moist NECK: supple w/o LA CV: rrr. PULM: ctab, no inc wob ABD: soft, +bs EXT: trace BLE edema but normal DP pulses.  SKIN: no acute rash but AKs noted on BUE and 1cm fleshy papule concerning for BCC noted on the upper back. D/w pt.  R 3rd toe and L 4th with distal healing scab noted.  No new ulceration.    Labs d/w pt.

## 2016-01-28 NOTE — Assessment & Plan Note (Signed)
Slowly healing, d/w pt.  Continue routine care.

## 2016-01-28 NOTE — Assessment & Plan Note (Signed)
Still with pain.  Less swelling after steroid treatment.  Skin on feet has healed in the meantime.  Has seen rehab clinic in the meantime.  He'll f/u with rehab clinic for possibly sympathetic block, after bone scan is done.

## 2016-01-28 NOTE — Patient Instructions (Addendum)
Go to the lab on the way out.  We'll contact you with your lab report. Take care.  Glad to see you.  John LimerickMarion will call about your referral. See her on the way out.

## 2016-01-28 NOTE — Assessment & Plan Note (Signed)
Tetanus ~2010. Flu today  PNA not due  Shingles not due D/w patient [id number]:options for colon cancer screening, including IFOB vs. colonoscopy.  Risks and benefits of both were discussed and patient voiced understanding.  Pt elects for: IFOB.  Prostate cancer screening and PSA options (with potential risks and benefits of testing vs not testing) were discussed along with recent recs/guidelines.  He declined testing PSA at this point.  He'll consider testing in the meantime.   Living will d/w pt.  Wife designated if patient were incapacitated.   Diet and exercise d/w pt.  Walking more, with pain from the neuropathy.  He is working on diet, d/w pt about diet with his work schedule.   HIV and HCV prev neg, d/w pt.

## 2016-01-30 ENCOUNTER — Encounter
Admission: RE | Admit: 2016-01-30 | Discharge: 2016-01-30 | Disposition: A | Discharge status: Home / Self Care | Payer: BLUE CROSS/BLUE SHIELD | Source: Ambulatory Visit | Attending: Physical Medicine & Rehabilitation | Admitting: Physical Medicine & Rehabilitation

## 2016-01-30 DIAGNOSIS — M79672 Pain in left foot: Secondary | ICD-10-CM | POA: Diagnosis not present

## 2016-01-30 DIAGNOSIS — G9050 Complex regional pain syndrome I, unspecified: Secondary | ICD-10-CM | POA: Diagnosis not present

## 2016-01-30 DIAGNOSIS — M79671 Pain in right foot: Secondary | ICD-10-CM | POA: Diagnosis not present

## 2016-01-30 DIAGNOSIS — IMO0002 Reserved for concepts with insufficient information to code with codable children: Secondary | ICD-10-CM

## 2016-01-30 MED ORDER — TECHNETIUM TC 99M MEDRONATE IV KIT
25.0000 | PACK | Freq: Once | INTRAVENOUS | Status: AC | PRN
  Administered 2016-01-30: 22.27 via INTRAVENOUS

## 2016-02-06 ENCOUNTER — Encounter: Payer: Self-pay | Admitting: Physical Medicine & Rehabilitation

## 2016-02-06 ENCOUNTER — Encounter
Payer: BLUE CROSS/BLUE SHIELD | Attending: Physical Medicine & Rehabilitation | Admitting: Physical Medicine & Rehabilitation

## 2016-02-06 VITALS — BP 121/84 | HR 59 | Resp 17

## 2016-02-06 DIAGNOSIS — M792 Neuralgia and neuritis, unspecified: Secondary | ICD-10-CM | POA: Diagnosis not present

## 2016-02-06 DIAGNOSIS — M7989 Other specified soft tissue disorders: Secondary | ICD-10-CM | POA: Diagnosis not present

## 2016-02-06 DIAGNOSIS — G4700 Insomnia, unspecified: Secondary | ICD-10-CM | POA: Diagnosis not present

## 2016-02-06 DIAGNOSIS — R200 Anesthesia of skin: Secondary | ICD-10-CM | POA: Insufficient documentation

## 2016-02-06 DIAGNOSIS — N170 Acute kidney failure with tubular necrosis: Secondary | ICD-10-CM | POA: Insufficient documentation

## 2016-02-06 DIAGNOSIS — R609 Edema, unspecified: Secondary | ICD-10-CM

## 2016-02-06 DIAGNOSIS — R269 Unspecified abnormalities of gait and mobility: Secondary | ICD-10-CM | POA: Diagnosis not present

## 2016-02-06 DIAGNOSIS — Z5181 Encounter for therapeutic drug level monitoring: Secondary | ICD-10-CM | POA: Diagnosis not present

## 2016-02-06 DIAGNOSIS — D649 Anemia, unspecified: Secondary | ICD-10-CM | POA: Insufficient documentation

## 2016-02-06 DIAGNOSIS — R61 Generalized hyperhidrosis: Secondary | ICD-10-CM | POA: Insufficient documentation

## 2016-02-06 DIAGNOSIS — IMO0002 Reserved for concepts with insufficient information to code with codable children: Secondary | ICD-10-CM

## 2016-02-06 DIAGNOSIS — H8109 Meniere's disease, unspecified ear: Secondary | ICD-10-CM | POA: Insufficient documentation

## 2016-02-06 DIAGNOSIS — G6281 Critical illness polyneuropathy: Secondary | ICD-10-CM | POA: Insufficient documentation

## 2016-02-06 DIAGNOSIS — J9690 Respiratory failure, unspecified, unspecified whether with hypoxia or hypercapnia: Secondary | ICD-10-CM | POA: Diagnosis not present

## 2016-02-06 DIAGNOSIS — G9050 Complex regional pain syndrome I, unspecified: Secondary | ICD-10-CM | POA: Diagnosis not present

## 2016-02-06 DIAGNOSIS — Z79899 Other long term (current) drug therapy: Secondary | ICD-10-CM | POA: Diagnosis not present

## 2016-02-06 DIAGNOSIS — G894 Chronic pain syndrome: Secondary | ICD-10-CM | POA: Diagnosis not present

## 2016-02-06 MED ORDER — DULOXETINE HCL 60 MG PO CPEP: 60.0000 mg | ORAL_CAPSULE | Freq: Every day | ORAL | 1 refills | Status: DC

## 2016-02-06 MED ORDER — PREGABALIN 100 MG PO CAPS: 200.0000 mg | ORAL_CAPSULE | Freq: Three times a day (TID) | ORAL | 1 refills | Status: DC

## 2016-02-06 NOTE — Progress Notes (Deleted)
   Subjective:    Patient ID: John Hoover, male    DOB: 1965/06/28, 50 y.o.   MRN: [id number]  HPI  Pain Inventory Average Pain 7 Pain Right Now 8 My pain is constant, burning and tingling  In the last 24 hours, has pain interfered with the following? General activity 3 Relation with others 3 Enjoyment of life 6 What TIME of day is your pain at its worst? evening Sleep (in general) NA  Pain is worse with: walking and standing Pain improves with: rest and medication Relief from Meds: 5  Mobility Do you have any goals in this area?  no  Function Do you have any goals in this area?  no  Neuro/Psych numbness tingling  Prior Studies Any changes since last visit?  yes bone scan  Physicians involved in your care Any changes since last visit?  no   Family History  Problem Relation Age of Onset  . Hypertension Mother   . Hypertension Father   . Colon cancer Neg Hx   . Prostate cancer Neg Hx    Social History   Social History  . Marital status: Married    Spouse name: N/A  . Number of children: N/A  . Years of education: N/A   Occupational History  . Works in Product/process development scientistconcrete/ building    Social History Main Topics  . Smoking status: Never Smoker  . Smokeless tobacco: Never Used  . Alcohol use Yes     Comment: weendends  . Drug use: No  . Sexual activity: Not Asked   Other Topics Concern  . None   Social History Narrative   1 daughter (who has DM1) and 1 son   Married 25+ years   Past Surgical History:  Procedure Laterality Date  . NASAL SINUS SURGERY     Past Medical History:  Diagnosis Date  . AKI (acute kidney injury) ([facility])   . Anemia   . Critical illness polyneuropathy ([facility])   . Flu   . Meniere's disease   . Neuropathy ([facility])   . Physical deconditioning   . Respiratory failure ([facility])    VDRF flu 2017  . Rhabdomyolysis    BP 121/84   Pulse (!) 59   Resp 17   SpO2 98%   Opioid Risk Score:   Fall Risk Score:  `1  Depression screen PHQ  2/9  [facility] 11/28/2015 10/18/2015  Decreased Interest 0 1  Down, Depressed, Hopeless 0 0  PHQ - 2 Score 0 1    Review of Systems  Neurological: Positive for numbness.       Tingling   All other systems reviewed and are negative.      Objective:   Physical Exam        Assessment & Plan:

## 2016-02-06 NOTE — Progress Notes (Signed)
Subjective:    Patient ID: John Hoover, male    DOB: June 16, 1965, 50 y.o.   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  He was seen in clinic and discharged to PCP, but returned for further workup.  He had an xray, of the RLE which was unremarkable.  He also completed high dose steroid taper. Last clinic visit 01/23/16.  His Lyrica was increased to 150 TID, but pt did not notice much difference. He had a triple phase bone scan.  Cymbalta was also ordered with ?benefit.    Pain Inventory Average Pain 7 Pain Right Now 8 My pain is constant, burning and tingling  In the last 24 hours, has pain interfered with the following? General activity 3 Relation with others 3 Enjoyment of life 6 What TIME of day is your pain at its worst? evening Sleep (in general) NA  Pain is worse with: walking and standing Pain improves with: rest and medication Relief from Meds: 5  Mobility Do you have any goals in this area?  no  Function Do you have any goals in this area?  no  Neuro/Psych numbness tingling  Prior Studies bone scan  Physicians involved in your care Any changes since last visit?  no   Family History  Problem Relation Age of Onset  . Hypertension Mother   . Hypertension Father   . Colon cancer Neg Hx   . Prostate cancer Neg Hx    Social History   Social History  . Marital status: Married    Spouse name: N/A  . Number of children: N/A  . Years of education: N/A   Occupational History  . Works in Product/process development scientist    Social History Main Topics  . Smoking status: Never Smoker  . Smokeless tobacco: Never Used  . Alcohol use Yes     Comment: weendends  . Drug use: No  . Sexual activity: Not Asked   Other Topics Concern  . None   Social History Narrative   1 daughter (who has DM1) and 1 son   Married 25+ years   Past Surgical History:  Procedure Laterality Date  . NASAL SINUS SURGERY     Past Medical  History:  Diagnosis Date  . AKI (acute kidney injury) (HCC)   . Anemia   . Critical illness polyneuropathy (HCC)   . Flu   . Meniere's disease   . Neuropathy (HCC)   . Physical deconditioning   . Respiratory failure (HCC)    VDRF flu 2017  . Rhabdomyolysis    BP 121/84   Pulse (!) 59   Resp 17   SpO2 98%   Opioid Risk Score:   Fall Risk Score:  `1  Depression screen PHQ 2/9  Depression screen [facility] 2/9 11/28/2015 10/18/2015  Decreased Interest 0 1  Down, Depressed, Hopeless 0 0  PHQ - 2 Score 0 1   Review of Systems  Neurological: Positive for numbness.       Tingling    All other systems reviewed and are negative.     Objective:   Physical Exam  Constitutional: He appears well-developed and well-nourished. No distress.  HENT:  Normocephalic and atraumatic.   Eyes: Conjunctivae and EOM are normal.    Cardiovascular: Regular Rate and rhythm.    Respiratory: Effort normal. No respiratory distress. Clear to ausculation.   GI: Soft. Bowel sounds are normal. He exhibits no distension. There is  no tenderness.  Musculoskeletal:  He exhibits edema in bilateral feet, R>L.  Decreased almost WNL b/l ankles. No tenderness. Gait: Mild steppage gait, significantly improved Neurological: He is alert and oriented.  Motor: 5/5 throughout Skin: Erythema at tips of RLE digits. Mild discoloration of RLE vs LLE. Ischemic changes to mainly to toes (improved); fingertips resolved Psychiatric: His behavior is normal. Thought content normal. Cognition and memory are normal.    Assessment & Plan:  50 year old male presents for neuropathic pain.  1. CRPS  Has tried Gabapetin 1200 TID, with minimal benefit   Completed steroid taper with great benefit, discussed option of trial of repeat dose pack, but will defer for time being  Pt much more functional now, working full time, rarely with breaks.  Educated pt on necessity of breaks.  His pain is likely unchanged because he is significantly  more functional   Will increase Lyrica to 200 TID from 150 TID.  Cont therapies  Cont ROM to b/l ankles  Xray reviewed, unremarkable.    Triple phase bone scan reviewed suggesting possible R>L mild CRPS.  Will increase Cymbalta to 60mg   Pt interested in referral for sympathetic nerve block, will refer.    2. Sleep disturbance  Improved, now off medication  3. Peripheral edema:   Secondary to CRPS  Cont elevation  Cont limit salt intake  Encouraged use of ice if sensate                    4. Abnormality of gait  Secondary to numbness and pain  Denies falls  No assistive device

## 2016-03-14 ENCOUNTER — Telehealth: Payer: Self-pay | Admitting: Physical Medicine & Rehabilitation

## 2016-03-14 NOTE — Telephone Encounter (Signed)
patient cancelled appt with PATEL on 10/18 claiming he was seeing his pain doctor on 10/23.  Patient was asked the name of the pain doctor - patient claimed he thinks its a DR Ollen BowlHARKINS and it may be for an injection. If there are any questions please call patient.

## 2016-03-17 DIAGNOSIS — D485 Neoplasm of uncertain behavior of skin: Secondary | ICD-10-CM | POA: Diagnosis not present

## 2016-03-17 DIAGNOSIS — L905 Scar conditions and fibrosis of skin: Secondary | ICD-10-CM | POA: Diagnosis not present

## 2016-03-17 DIAGNOSIS — D225 Melanocytic nevi of trunk: Secondary | ICD-10-CM | POA: Diagnosis not present

## 2016-03-17 DIAGNOSIS — L570 Actinic keratosis: Secondary | ICD-10-CM | POA: Diagnosis not present

## 2016-03-17 DIAGNOSIS — C44519 Basal cell carcinoma of skin of other part of trunk: Secondary | ICD-10-CM | POA: Diagnosis not present

## 2016-03-19 ENCOUNTER — Ambulatory Visit: Payer: BLUE CROSS/BLUE SHIELD | Admitting: Physical Medicine & Rehabilitation

## 2016-03-24 DIAGNOSIS — G6289 Other specified polyneuropathies: Secondary | ICD-10-CM | POA: Diagnosis not present

## 2016-03-27 ENCOUNTER — Other Ambulatory Visit: Payer: Self-pay | Admitting: Physical Medicine & Rehabilitation

## 2016-04-01 ENCOUNTER — Encounter: Payer: Self-pay | Admitting: Physical Medicine & Rehabilitation

## 2016-04-01 ENCOUNTER — Other Ambulatory Visit: Payer: Self-pay | Admitting: Physical Medicine & Rehabilitation

## 2016-04-07 DIAGNOSIS — N182 Chronic kidney disease, stage 2 (mild): Secondary | ICD-10-CM | POA: Diagnosis not present

## 2016-04-16 ENCOUNTER — Encounter: Payer: Self-pay | Admitting: Physical Medicine & Rehabilitation

## 2016-04-25 ENCOUNTER — Encounter: Payer: Self-pay | Admitting: Physical Medicine & Rehabilitation

## 2016-04-28 ENCOUNTER — Telehealth: Payer: Self-pay | Admitting: *Deleted

## 2016-04-28 ENCOUNTER — Encounter: Payer: Self-pay | Admitting: Family Medicine

## 2016-04-28 DIAGNOSIS — G5770 Causalgia of unspecified lower limb: Secondary | ICD-10-CM

## 2016-04-28 DIAGNOSIS — M792 Neuralgia and neuritis, unspecified: Secondary | ICD-10-CM

## 2016-04-28 NOTE — Telephone Encounter (Signed)
Order placed for neurology referral at request of patient and per Dr Allena KatzPatel.

## 2016-04-30 ENCOUNTER — Other Ambulatory Visit: Payer: Self-pay | Admitting: Family Medicine

## 2016-04-30 DIAGNOSIS — M792 Neuralgia and neuritis, unspecified: Secondary | ICD-10-CM

## 2016-05-06 ENCOUNTER — Encounter: Payer: Self-pay | Admitting: Diagnostic Neuroimaging

## 2016-05-06 ENCOUNTER — Ambulatory Visit (INDEPENDENT_AMBULATORY_CARE_PROVIDER_SITE_OTHER): Payer: BLUE CROSS/BLUE SHIELD | Admitting: Diagnostic Neuroimaging

## 2016-05-06 VITALS — BP 118/81 | HR 73 | Ht 73.0 in | Wt 209.6 lb

## 2016-05-06 DIAGNOSIS — G6281 Critical illness polyneuropathy: Secondary | ICD-10-CM

## 2016-05-06 DIAGNOSIS — I70229 Atherosclerosis of native arteries of extremities with rest pain, unspecified extremity: Secondary | ICD-10-CM

## 2016-05-06 DIAGNOSIS — I998 Other disorder of circulatory system: Secondary | ICD-10-CM | POA: Diagnosis not present

## 2016-05-06 DIAGNOSIS — I70228 Atherosclerosis of native arteries of extremities with rest pain, other extremity: Secondary | ICD-10-CM

## 2016-05-06 NOTE — Patient Instructions (Signed)
Thank you for coming to see [place] at [facility]. I hope we have been able to provide you high quality care today.  You may receive a patient satisfaction survey over the next few weeks. We would appreciate your feedback and comments so that we may continue to improve ourselves and the health of our patients.  - try vitamin supplements (b-complex, folic acid, alpha-lipoic acid)  - try compounded neuropathy cream    ~~~~~~~~~~~~~~~~~~~~~~~~~~~~~~~~~~~~~~~~~~~~~~~~~~~~~~~~~~~~~~~~~  DR. PENUMALLI'S GUIDE TO HAPPY AND HEALTHY LIVING These are some of my general health and wellness recommendations. Some of them may apply to you better than others. Please use common sense as you try these suggestions and feel free to ask me any questions.   ACTIVITY/FITNESS Mental, social, emotional and physical stimulation are very important for brain and body health. Try learning a new activity (arts, music, language, sports, games).  Keep moving your body to the best of your abilities. You can do this at home, inside or outside, the park, community center, gym or anywhere you like. Consider a physical therapist or personal trainer to get started. Consider the app Sworkit. Fitness trackers such as smart-watches, smart-phones or Fitbits can help as well.   NUTRITION Eat more plants: colorful vegetables, nuts, seeds and berries.  Eat less sugar, salt, preservatives and processed foods.  Avoid toxins such as cigarettes and alcohol.  Drink water when you are thirsty. Warm water with a slice of lemon is an excellent morning drink to start the day.  Consider these websites for more information The Nutrition Source (https://www.henry-hernandez.biz/) Precision Nutrition (WindowBlog.ch)   RELAXATION Consider practicing mindfulness meditation or other relaxation techniques such as deep breathing, prayer, yoga, tai chi, massage. See website mindful.org  or the apps Headspace or Calm to help get started.   SLEEP Try to get at least 7-8+ hours sleep per day. Regular exercise and reduced caffeine will help you sleep better. Practice good sleep hygeine techniques. See website sleep.org for more information.   PLANNING Prepare estate planning, living will, healthcare POA documents. Sometimes this is best planned with the help of an attorney. Theconversationproject.org and agingwithdignity.org are excellent resources.

## 2016-05-06 NOTE — Progress Notes (Signed)
[facility]  PATIENT: John Hoover DOB: 04/14/1966  REFERRING CLINICIAN: Crawford Givens  HISTORY FROM: patient  REASON FOR VISIT: new consult   HISTORICAL  CHIEF COMPLAINT:  Chief Complaint  Patient presents with  . Neuropathic pain    rm 6, New Pt    HISTORY OF PRESENT ILLNESS:   50 year old male here for evaluation of neuropathy. In April 2017 patient had influenza, complicated by acute renal failure, rhabdomyolysis, pneumonia and sepsis. Patient had severe critical illness and ICU stay. In addition he had critical ischemic changes to his peripheral limbs, fingers and toes. Fortunately he was able to survive this severe illness with prolonged hospital course and rehabilitation. However during his recovery he noticed severe numbness, tingling, weakness in his extremities. Some of these symptoms improved over time. He continues to have numbness and tingling in his toes and feet as well as slightly in his fingers and hands. Symptoms have stabilized over the past few months.   REVIEW OF SYSTEMS: Full 14 system review of systems performed and negative with exception of: Numbness.  ALLERGIES: Allergies  Allergen Reactions  . Desloratadine     Ineffective   . Fexofenadine     Ineffective   . Loratadine     Ineffective     HOME MEDICATIONS: Outpatient Medications Prior to Visit  Medication Sig Dispense Refill  . DULoxetine (CYMBALTA) 60 MG capsule Take 1 capsule (60 mg total) by mouth daily. 60 capsule 1  . pregabalin (LYRICA) 100 MG capsule Take 2 capsules (200 mg total) by mouth 3 (three) times daily. 180 capsule 0   No facility-administered medications prior to visit.     PAST MEDICAL HISTORY: Past Medical History:  Diagnosis Date  . AKI (acute kidney injury) (HCC)   . Anemia   . Critical illness polyneuropathy (HCC)   . Flu   . Meniere's disease   . Neuropathy (HCC)   . Physical deconditioning   . Respiratory failure (HCC)    VDRF flu  2017  . Rhabdomyolysis     PAST SURGICAL HISTORY: Past Surgical History:  Procedure Laterality Date  . NASAL SINUS SURGERY      FAMILY HISTORY: Family History  Problem Relation Age of Onset  . Hypertension Mother   . Cancer Mother     breast  . Hypertension Father   . Colon cancer Neg Hx   . Prostate cancer Neg Hx     SOCIAL HISTORY:  Social History   Social History  . Marital status: Married    Spouse name: N/A  . Number of children: 2  . Years of education: 16   Occupational History  . Works in Product/process development scientist     [employer]   Social History Main Topics  . Smoking status: Never Smoker  . Smokeless tobacco: Never Used  . Alcohol use Yes     Comment: weendends  . Drug use: No  . Sexual activity: Not on file   Other Topics Concern  . Not on file   Social History Narrative   1 daughter (who has DM1) and 1 son   Married 25+ years   Caffeine- sodas, one/wk; tea, up to 1 gallon daily     PHYSICAL EXAM  GENERAL EXAM/CONSTITUTIONAL: Vitals:  Vitals:   05/06/16 0944  BP: 118/81  Pulse: 73  Weight: 209 lb 9.6 oz (95.1 kg)  Height: 6\' 1"  (1.854 m)     Body mass index is 27.65 kg/m.  Visual Acuity Screening   Right  eye Left eye Both eyes  Without correction:     With correction: 20/40 20/50      Patient is in no distress; well developed, nourished and groomed; neck is supple  CARDIOVASCULAR:  Examination of carotid arteries is normal; no carotid bruits  Regular rate and rhythm, no murmurs  Examination of peripheral vascular system by observation and palpation is normal  EYES:  Ophthalmoscopic exam of optic discs and posterior segments is normal; no papilledema or hemorrhages  MUSCULOSKELETAL:  Gait, strength, tone, movements noted in Neurologic exam below  NEUROLOGIC: MENTAL STATUS:  No flowsheet data found.  awake, alert, oriented to person, place and time  recent and remote memory intact  normal attention and  concentration  language fluent, comprehension intact, naming intact,   fund of knowledge appropriate  CRANIAL NERVE:   2nd - no papilledema on fundoscopic exam  2nd, 3rd, 4th, 6th - pupils equal and reactive to light, visual fields full to confrontation, extraocular muscles intact, no nystagmus  5th - facial sensation symmetric  7th - facial strength symmetric  8th - hearing intact  9th - palate elevates symmetrically, uvula midline  11th - shoulder shrug symmetric  12th - tongue protrusion midline  MOTOR:   normal bulk and tone, full strength in the BUE, BLE  EXCEPT SLIGHTLY DECREASED DORSIFLEXION IN BILATERAL FEET  SENSORY:   normal and symmetric to light touch, temperature, vibration  DECR VIB AT TOES 6 SEC; DECR PP IN FEET/TOES  COORDINATION:   finger-nose-finger, fine finger movements normal  REFLEXES:   deep tendon reflexes 2+ and symmetric  GAIT/STATION:   narrow based gait; able to walk on toes, tandem; SLIGHT DIFF WITH HEEL GAIT; romberg is negative    DIAGNOSTIC DATA (LABS, IMAGING, TESTING) - I reviewed patient records, labs, notes, testing and imaging myself where available.  Lab Results  Component Value Date   WBC 7.1 01/25/2016   HGB 13.0 01/25/2016   HCT 38.3 (L) 01/25/2016   MCV 91.0 01/25/2016   PLT 198.0 01/25/2016      Component Value Date/Time   NA 142 01/25/2016 0759   NA 139 11/06/2010   K 4.7 01/25/2016 0759   K 3.7 11/06/2010   CL 109 01/25/2016 0759   CL 102 11/06/2010   CO2 28 01/25/2016 0759   CO2 22 11/06/2010   GLUCOSE 94 01/25/2016 0759   BUN 20 01/25/2016 0759   BUN 10 11/06/2010   CREATININE 1.32 01/25/2016 0759   CREATININE 0.99 11/06/2010   CALCIUM 9.0 01/25/2016 0759   CALCIUM 8.6 11/06/2010   PROT 6.6 01/25/2016 0759   ALBUMIN 4.0 01/25/2016 0759   AST 16 01/25/2016 0759   ALT 17 01/25/2016 0759   ALKPHOS 50 01/25/2016 0759   BILITOT 0.3 01/25/2016 0759   GFRNONAA 47 (L) 10/05/2015 0806   GFRAA 54  (L) 10/05/2015 0806   Lab Results  Component Value Date   CHOL 209 (H) 01/25/2016   HDL 44.90 01/25/2016   LDLCALC 147 (H) 01/25/2016   TRIG 83.0 01/25/2016   CHOLHDL 5 01/25/2016   Lab Results  Component Value Date   HGBA1C 5.9 (H) 09/15/2015   Lab Results  Component Value Date   VITAMINB12 998 (H) 09/25/2015   No results found for: TSH   08/28/15 CT head [I reviewed images myself and agree with interpretation. -VRP]  - No acute intracranial findings or mass lesion. 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  ASSESSMENT AND PLAN  50 y.o. year old male here with:  Dx: critical illness neuropathy (post-flu, ARF, sepsis, limb ischemia, ICU)  1. Critical illness neuropathy (HCC)   2. Critical ischemia of upper extremity   3. Critical ischemia of lower extremity      PLAN:  - continue lyrica and cymbalta  - trial of compounded neuropathy cream  - try vitamin supplements (b-complex, folic acid, alpha-lipoic acid)   Return in about 3 months (around 08/04/2016).    Suanne MarkerVIKRAM R. Xavi Tomasik, MD 05/06/2016, 10:40 AM Certified in Neurology, Neurophysiology and Neuroimaging  [facility] [address], [address] [place], [place] [address] [phone]

## 2016-05-08 DIAGNOSIS — L905 Scar conditions and fibrosis of skin: Secondary | ICD-10-CM | POA: Diagnosis not present

## 2016-05-08 DIAGNOSIS — C44519 Basal cell carcinoma of skin of other part of trunk: Secondary | ICD-10-CM | POA: Diagnosis not present

## 2016-05-12 DIAGNOSIS — C44519 Basal cell carcinoma of skin of other part of trunk: Secondary | ICD-10-CM | POA: Diagnosis not present

## 2016-05-22 ENCOUNTER — Other Ambulatory Visit: Payer: Self-pay | Admitting: Physical Medicine & Rehabilitation

## 2016-05-23 ENCOUNTER — Telehealth: Payer: Self-pay

## 2016-05-23 MED ORDER — PREGABALIN 100 MG PO CAPS: 200.0000 mg | ORAL_CAPSULE | Freq: Three times a day (TID) | ORAL | 0 refills | Status: DC

## 2016-05-23 NOTE — Telephone Encounter (Signed)
Left John Hoover a voicemail informing him one month supply refilled and future requires an appt.

## 2016-05-23 NOTE — Telephone Encounter (Signed)
Patient requested a refill of lyrica, he does not a follow up appointment scheduled with this clinic, his last appointment was canceled due to him being scheduled for an injection with a Dr. Roseanne RenoHarkens, he was also recently referred to neurology, should we go ahead and refill his lyrica?

## 2016-05-23 NOTE — Telephone Encounter (Signed)
We can refill once time, but if he is not planning to follow up with us, this will be the last time.  Otherwise, please ask pt to reschedule. Thanks.

## 2016-05-28 ENCOUNTER — Encounter: Payer: Self-pay | Admitting: Family Medicine

## 2016-05-28 ENCOUNTER — Other Ambulatory Visit: Payer: Self-pay | Admitting: Physical Medicine & Rehabilitation

## 2016-05-30 ENCOUNTER — Telehealth: Payer: Self-pay | Admitting: Family Medicine

## 2016-05-30 ENCOUNTER — Encounter: Payer: Self-pay | Admitting: Family Medicine

## 2016-05-30 MED ORDER — PREGABALIN 100 MG PO CAPS: 200.0000 mg | ORAL_CAPSULE | Freq: Two times a day (BID) | ORAL | 0 refills | Status: DC | PRN

## 2016-05-30 NOTE — Telephone Encounter (Signed)
Medication phoned to pharmacy.  

## 2016-05-30 NOTE — Telephone Encounter (Signed)
Last refill picked up was 03/30/16 [facility] [address] [facility] [place], [place](Jamie)

## 2016-05-30 NOTE — Telephone Encounter (Signed)
Please call in.  Thanks.   

## 2016-05-30 NOTE — Telephone Encounter (Signed)
See mychart message.  Please verify the last refill with the pharmacy.   If not recently filled, patient will need this called in.  Let me know so I can adjust the sig.   Thanks.

## 2016-06-12 ENCOUNTER — Encounter: Payer: Self-pay | Admitting: Physical Medicine & Rehabilitation

## 2016-06-13 ENCOUNTER — Other Ambulatory Visit: Payer: Self-pay | Admitting: Physical Medicine & Rehabilitation

## 2016-06-26 ENCOUNTER — Encounter: Payer: Self-pay | Admitting: Family Medicine

## 2016-08-05 ENCOUNTER — Encounter: Payer: Self-pay | Admitting: Diagnostic Neuroimaging

## 2016-08-05 ENCOUNTER — Ambulatory Visit (INDEPENDENT_AMBULATORY_CARE_PROVIDER_SITE_OTHER): Payer: BLUE CROSS/BLUE SHIELD | Admitting: Diagnostic Neuroimaging

## 2016-08-05 VITALS — BP 142/82 | HR 86 | Wt 213.0 lb

## 2016-08-05 DIAGNOSIS — G6281 Critical illness polyneuropathy: Secondary | ICD-10-CM | POA: Diagnosis not present

## 2016-08-05 NOTE — Progress Notes (Signed)
[facility]  PATIENT: John Hoover DOB: [birth year]  REFERRING CLINICIAN: Crawford GivensGraham Duncan  HISTORY FROM: patient  REASON FOR VISIT: follow up    HISTORICAL  CHIEF COMPLAINT:  Chief Complaint  Patient presents with  . Critical illness neuropathy    rm 7, "medication helps some but side effects- fatigue, weight gain, hunger- aren't good"  . Follow-up    3 month    HISTORY OF PRESENT ILLNESS:   UPDATE 08/05/16: Since last visit, neuropathy pain continues. Lyrica helps a little. However has noted gradual weight gain. Has tried neuropathy cream but not much benefit.   PRIOR HPI (05/06/16): 51 year old male here for evaluation of neuropathy. In April 2017 patient had influenza, complicated by acute renal failure, rhabdomyolysis, pneumonia and sepsis. Patient had severe critical illness and ICU stay. In addition he had critical ischemic changes to his peripheral limbs, fingers and toes. Fortunately he was able to survive this severe illness with prolonged hospital course and rehabilitation. However during his recovery he noticed severe numbness, tingling, weakness in his extremities. Some of these symptoms improved over time. He continues to have numbness and tingling in his toes and feet as well as slightly in his fingers and hands. Symptoms have stabilized over the past few months.   REVIEW OF SYSTEMS: Full 14 system review of systems performed and negative with exception of: as per HPI.   ALLERGIES: Allergies  Allergen Reactions  . Desloratadine     Ineffective   . Fexofenadine     Ineffective   . Loratadine     Ineffective     HOME MEDICATIONS: Outpatient Medications Prior to Visit  Medication Sig Dispense Refill  . DULoxetine (CYMBALTA) 60 MG capsule TAKE 1 CAPSULE EVERY DAY 60 capsule 1  . pregabalin (LYRICA) 100 MG capsule Take 2 capsules (200 mg total) by mouth 2 (two) times daily as needed. 360 capsule 0   No facility-administered medications prior  to visit.     PAST MEDICAL HISTORY: Past Medical History:  Diagnosis Date  . AKI (acute kidney injury) (HCC)   . Anemia   . Critical illness polyneuropathy (HCC)   . Flu   . Meniere's disease   . Neuropathy (HCC)   . Physical deconditioning   . Respiratory failure (HCC)    VDRF flu 2017  . Rhabdomyolysis     PAST SURGICAL HISTORY: Past Surgical History:  Procedure Laterality Date  . NASAL SINUS SURGERY      FAMILY HISTORY: Family History  Problem Relation Age of Onset  . Hypertension Mother   . Cancer Mother     breast  . Hypertension Father   . Colon cancer Neg Hx   . Prostate cancer Neg Hx     SOCIAL HISTORY:  Social History   Social History  . Marital status: Married    Spouse name: N/A  . Number of children: 2  . Years of education: 16   Occupational History  . Works in Product/process development scientistconcrete/ building     [employer]   Social History Main Topics  . Smoking status: Never Smoker  . Smokeless tobacco: Never Used  . Alcohol use Yes     Comment: weendends  . Drug use: No  . Sexual activity: Not on file   Other Topics Concern  . Not on file   Social History Narrative   1 daughter (who has DM1) and 1 son   Married 25+ years   Caffeine- sodas, one/wk; tea, up to 1 gallon daily  PHYSICAL EXAM  GENERAL EXAM/CONSTITUTIONAL: Vitals:  Vitals:   08/05/16 0756  BP: (!) 142/82  Pulse: 86  Weight: 213 lb (96.6 kg)   Body mass index is 28.1 kg/m. No exam data present  Wt Readings from Last 3 Encounters:  08/05/16 213 lb (96.6 kg)  05/06/16 209 lb 9.6 oz (95.1 kg)  01/28/16 209 lb 12 oz (95.1 kg)     Patient is in no distress; well developed, nourished and groomed; neck is supple  CARDIOVASCULAR:  Examination of carotid arteries is normal; no carotid bruits  Regular rate and rhythm, no murmurs  Examination of peripheral vascular system by observation and palpation is normal  EYES:  Ophthalmoscopic exam of optic discs and posterior segments is  normal; no papilledema or hemorrhages  MUSCULOSKELETAL:  Gait, strength, tone, movements noted in Neurologic exam below  NEUROLOGIC: MENTAL STATUS:  No flowsheet data found.  awake, alert, oriented to person, place and time  recent and remote memory intact  normal attention and concentration  language fluent, comprehension intact, naming intact,   fund of knowledge appropriate  CRANIAL NERVE:   2nd - no papilledema on fundoscopic exam  2nd, 3rd, 4th, 6th - pupils equal and reactive to light, visual fields full to confrontation, extraocular muscles intact, no nystagmus  5th - facial sensation symmetric  7th - facial strength symmetric  8th - hearing intact  9th - palate elevates symmetrically, uvula midline  11th - shoulder shrug symmetric  12th - tongue protrusion midline  MOTOR:   normal bulk and tone, full strength in the BUE, BLE  EXCEPT SLIGHTLY DECREASED DORSIFLEXION IN BILATERAL FEET  SENSORY:   normal and symmetric to light touch, temperature, vibration  DECR VIB AT TOES  COORDINATION:   finger-nose-finger, fine finger movements normal  REFLEXES:   deep tendon reflexes 2+ and symmetric  GAIT/STATION:   narrow based gait; able to walk on toes, tandem; SLIGHT DIFF WITH HEEL GAIT; romberg is negative    DIAGNOSTIC DATA (LABS, IMAGING, TESTING) - I reviewed patient records, labs, notes, testing and imaging myself where available.  Lab Results  Component Value Date   WBC 7.1 01/25/2016   HGB 13.0 01/25/2016   HCT 38.3 (L) 01/25/2016   MCV 91.0 01/25/2016   PLT 198.0 01/25/2016      Component Value Date/Time   NA 142 01/25/2016 0759   NA 139 11/06/2010   K 4.7 01/25/2016 0759   K 3.7 11/06/2010   CL 109 01/25/2016 0759   CL 102 11/06/2010   CO2 28 01/25/2016 0759   CO2 22 11/06/2010   GLUCOSE 94 01/25/2016 0759   BUN 20 01/25/2016 0759   BUN 10 11/06/2010   CREATININE 1.32 01/25/2016 0759   CREATININE 0.99 11/06/2010   CALCIUM  9.0 01/25/2016 0759   CALCIUM 8.6 11/06/2010   PROT 6.6 01/25/2016 0759   ALBUMIN 4.0 01/25/2016 0759   AST 16 01/25/2016 0759   ALT 17 01/25/2016 0759   ALKPHOS 50 01/25/2016 0759   BILITOT 0.3 01/25/2016 0759   GFRNONAA 47 (L) 10/05/2015 0806   GFRAA 54 (L) 10/05/2015 0806   Lab Results  Component Value Date   CHOL 209 (H) 01/25/2016   HDL 44.90 01/25/2016   LDLCALC 147 (H) 01/25/2016   TRIG 83.0 01/25/2016   CHOLHDL 5 01/25/2016   Lab Results  Component Value Date   HGBA1C 5.9 (H) 09/15/2015   Lab Results  Component Value Date   VITAMINB12 998 (H) 09/25/2015   No results found  for: TSH   08/28/15 CT head [I reviewed images myself and agree with interpretation. -VRP]  - No acute intracranial findings or mass lesion. 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     ASSESSMENT AND PLAN  50 y.o. year old male here with:  Dx: critical illness neuropathy (post-flu, ARF, sepsis, limb ischemia, ICU)  1. Critical illness neuropathy (HCC)     Tried and failed: gabapentin and neuropathy cream   PLAN:  - continue lyrica 200mg  daily - twice a day; may try 100mg  three times per day  - continue cymbalta 60mg  daily  - follow up with PCP and pain mgmt  Return if symptoms worsen or fail to improve, for return to PCP.    Suanne Marker, MD 08/05/2016, 8:25 AM Certified in Neurology, Neurophysiology and Neuroimaging  [facility] [address], [address] [place], [place] [address] [phone]

## 2016-09-10 ENCOUNTER — Other Ambulatory Visit: Payer: Self-pay | Admitting: Physical Medicine & Rehabilitation

## 2016-09-11 NOTE — Telephone Encounter (Signed)
Recieved electronic medication refill request for duloxetine, last seen in this clinic in September of 2017, no future appointments scheduled, please advise

## 2016-09-28 ENCOUNTER — Other Ambulatory Visit: Payer: Self-pay | Admitting: Family Medicine

## 2016-09-29 MED ORDER — PREGABALIN 100 MG PO CAPS: 200.0000 mg | ORAL_CAPSULE | Freq: Two times a day (BID) | ORAL | 1 refills | Status: DC | PRN

## 2016-09-29 NOTE — Telephone Encounter (Signed)
Sent. Thanks.   

## 2016-09-29 NOTE — Telephone Encounter (Signed)
MyChart refill request. Last office visit:   01/28/2016 Last Filled:    360 capsule 0 05/30/2016  Please advise.

## 2016-10-09 ENCOUNTER — Encounter: Payer: Self-pay | Admitting: Family Medicine

## 2016-10-10 ENCOUNTER — Other Ambulatory Visit: Payer: Self-pay | Admitting: Family Medicine

## 2016-10-10 MED ORDER — PREGABALIN 100 MG PO CAPS: 100.0000 mg | ORAL_CAPSULE | Freq: Three times a day (TID) | ORAL | 1 refills | Status: DC

## 2016-10-10 NOTE — Progress Notes (Signed)
[facility] TO [facility] [address]

## 2016-10-10 NOTE — Progress Notes (Unsigned)
Please call in lyrica.  See mychart message.  Thanks.

## 2016-11-24 ENCOUNTER — Encounter: Payer: Self-pay | Admitting: Family Medicine

## 2016-11-24 ENCOUNTER — Ambulatory Visit (INDEPENDENT_AMBULATORY_CARE_PROVIDER_SITE_OTHER): Payer: BLUE CROSS/BLUE SHIELD | Admitting: Family Medicine

## 2016-11-24 DIAGNOSIS — S60512A Abrasion of left hand, initial encounter: Secondary | ICD-10-CM

## 2016-11-24 DIAGNOSIS — G6281 Critical illness polyneuropathy: Secondary | ICD-10-CM

## 2016-11-24 MED ORDER — PREGABALIN 100 MG PO CAPS: 200.0000 mg | ORAL_CAPSULE | Freq: Every day | ORAL | Status: DC

## 2016-11-24 NOTE — Progress Notes (Signed)
He isn't lightheaded.  Intentional weight loss noted.   Hypotension is noted with lyrica about 2% of the time, d/w pt.   His pain is better with lyrica but he feels more hunger and "mental fog" and fatigue with the medicine.  Discussed with patient about taking Lyrica in the morning and Cymbalta later in the day to see if that was more tolerable for him.  L hand injury.  11/12/16.  He missed a step and abraded L hand, dorsal side.  It had "pus" on the area prev but that may have been soft slough from the bandage.  It looks better today, per patient report.  He has not been wrapping the lesion tightly, recently. Slightly tender from the abrasion stretching.    Meds, vitals, and allergies reviewed.   ROS: Per HPI unless specifically indicated in ROS section   nad ncat Normal ROM L hand but 4 by 2 cm, "figure 8" shaped abrasion noted. One half has good skin adherence where the flap from the abrasion was folded down and has healed. The other half has dry scab that appears to be healing normally. No spreading erythema. No fluctuant mass. No ulceration. Not tender.

## 2016-11-24 NOTE — Patient Instructions (Signed)
The skin spot should heal.  Try taking lyrica in the AM and cymbalta later in the day.  See if that helps.  Take care.  Glad to see you.

## 2016-11-25 DIAGNOSIS — S60519A Abrasion of unspecified hand, initial encounter: Secondary | ICD-10-CM | POA: Insufficient documentation

## 2016-11-25 NOTE — Assessment & Plan Note (Signed)
Well on his way to healing. Do not anticipate complications. He likely had slough but not pus at the abrasion site. This was likely due to an occlusive bandage. Discussed with patient about not using an occlusive bandage all the time. Update me as needed. He agrees.

## 2016-11-25 NOTE — Assessment & Plan Note (Signed)
Reasonable to try Lyrica in the morning and then Cymbalta later in the day to see if this is effective with less side effects. He agrees.

## 2016-12-04 IMAGING — DX DG CHEST 1V PORT
1 series · 1 of 1 positions shown · non-contrast
Comparison: 09/07/2015, 09/05/2015

CLINICAL DATA: 49-year-old male with a history of acute respiratory
failure.

EXAM:
PORTABLE CHEST 1 VIEW

[chest ap]
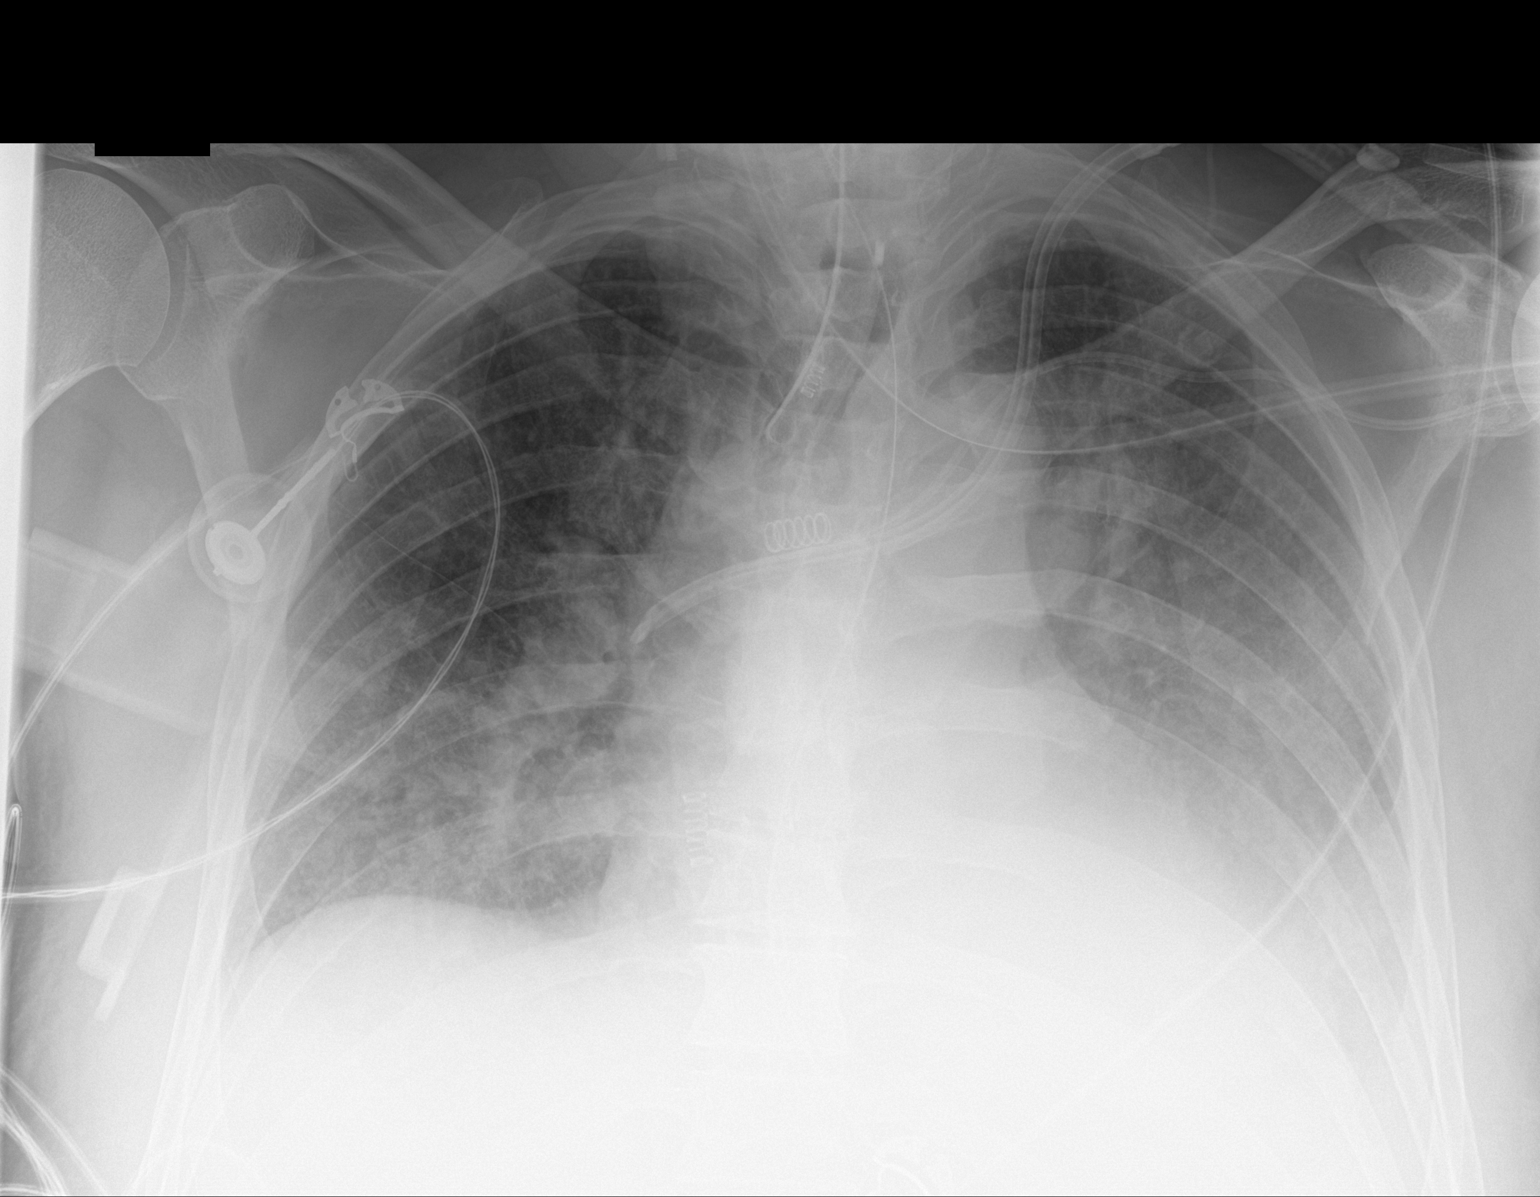

[1 of 1 positions shown; findings below may reference images not displayed]

FINDINGS: Cardiomediastinal silhouette unchanged, though partially obscured by
overlying lung and pleural disease.

Endotracheal tube unchanged, terminating 3.3 cm above the carina.

Left IJ central catheter unchanged, terminating in the superior vena
cava.

Unchanged left subclavian central line, terminating in the superior
vena cava.

Gastric tube unchanged, terminating out of the field of view.

No pneumothorax.

Bilateral mixed interstitial and airspace opacities, slightly
improved on the left.
IMPRESSION: Similar appearance the chest x-ray with mild improvement aeration on
the left. Mixed interstitial and airspace opacities may reflect
persisting edema, atelectasis, and/ or consolidation. Likely
persisting left pleural effusion.

Unchanged support apparatus as above.

## 2016-12-05 IMAGING — DX DG CHEST 1V PORT
1 series · 1 of 1 positions shown · non-contrast
Comparison: 09/08/2015

CLINICAL DATA: Acute respiratory failure.

EXAM:
PORTABLE CHEST 1 VIEW

[chest ap]
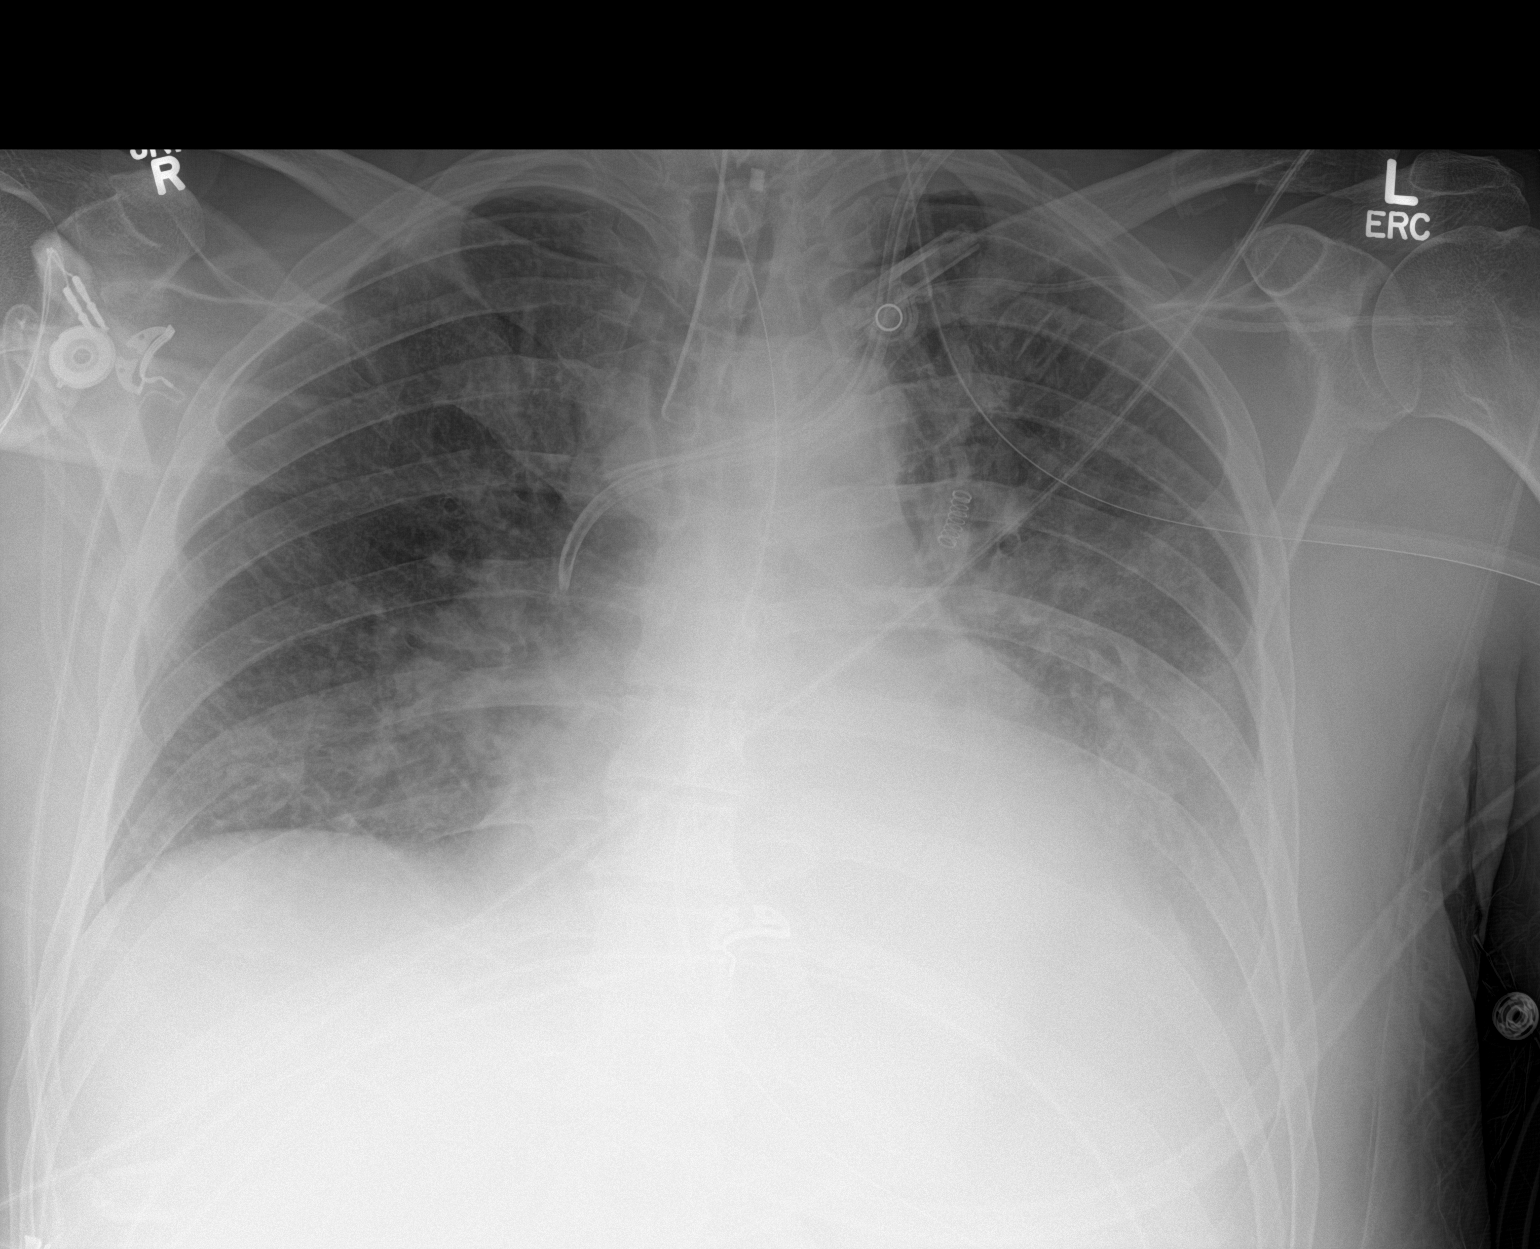

[1 of 1 positions shown; findings below may reference images not displayed]

FINDINGS: Stable appearance of the endotracheal tube, 3.5 cm above the carina.
Hazy airspace densities in both lungs, left side greater than right.
The left hemidiaphragm is obscured by left basilar densities. Heart
size is probably upper limits of normal. Left jugular dialysis
catheter tip in the SVC. Nasogastric tube extends into the abdomen.
Negative for a pneumothorax. Left subclavian central line tip is
near the upper SVC.
IMPRESSION: Persistent interstitial and airspace densities, left side greater
than right. Minimal change from the previous examination. Persistent
consolidation at the left lung base could reflect a combination of
pleural fluid and airspace disease/ atelectasis.

Support apparatuses as described.

## 2016-12-08 IMAGING — DX DG CHEST 1V PORT
1 series · 1 of 1 positions shown · non-contrast
Comparison: Chest x-ray of September 10, 2015

CLINICAL DATA: Respiratory failure, acute renal failure,
hypovolemic shock

EXAM:
PORTABLE CHEST 1 VIEW

[chest ap]
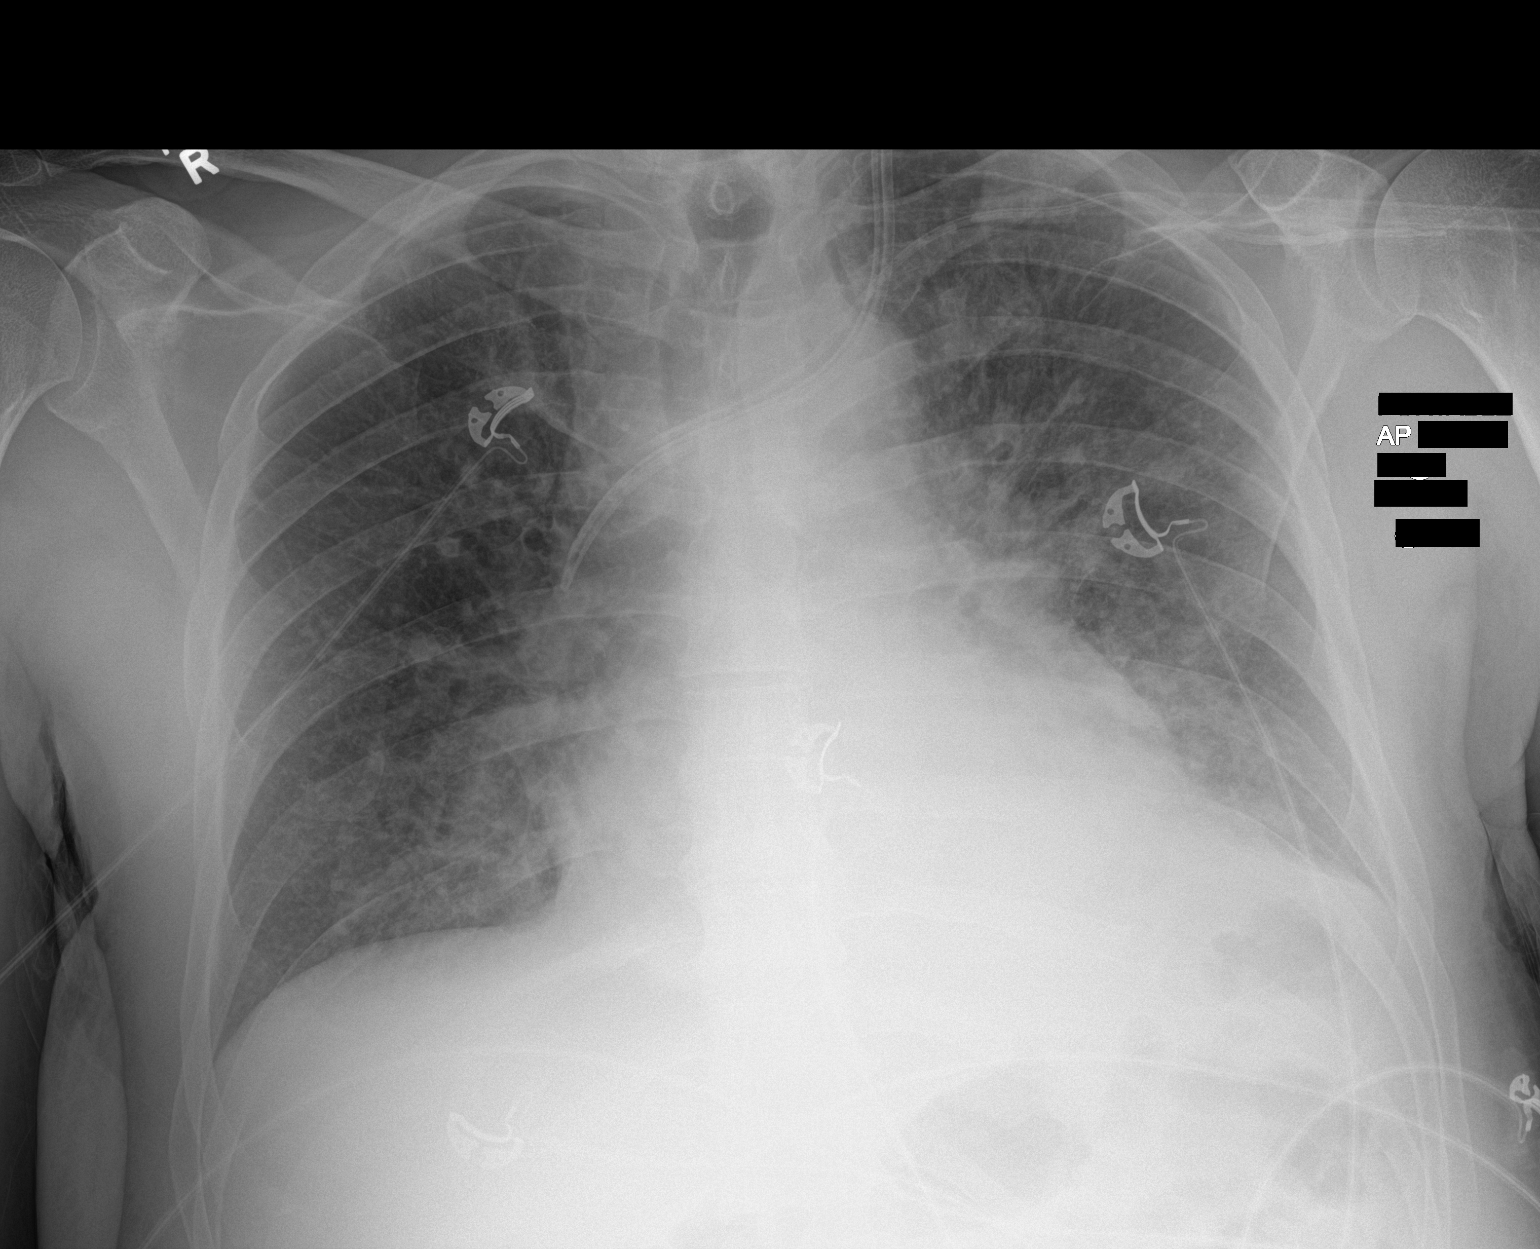

[1 of 1 positions shown; findings below may reference images not displayed]

FINDINGS: There is been interval extubation of the trachea and of the
esophagus. The lungs are adequately inflated. The pulmonary
interstitial markings remain increased. Retrocardiac density on the
left has decreased. The cardiac silhouette is mildly enlarged. The
central pulmonary vascularity is prominent. The large caliber left
internal jugular venous catheter tip projects over the proximal
third of the SVC. The subclavian venous catheter on the left has its
tip projecting at the junction of the right and left brachiocephalic
veins.
IMPRESSION: Improving left lower lobe atelectasis or pneumonia. Persistent mild
pulmonary vascular congestion. There is no pleural effusion or
pneumothorax. The remaining support tubes are in reasonable
position.

## 2016-12-10 IMAGING — DX DG CHEST 1V PORT
1 series · 1 of 1 positions shown · non-contrast
Comparison: 09/12/2015 .

CLINICAL DATA: Respiratory failure.  Renal failure.

EXAM:
PORTABLE CHEST 1 VIEW

[chest ap]
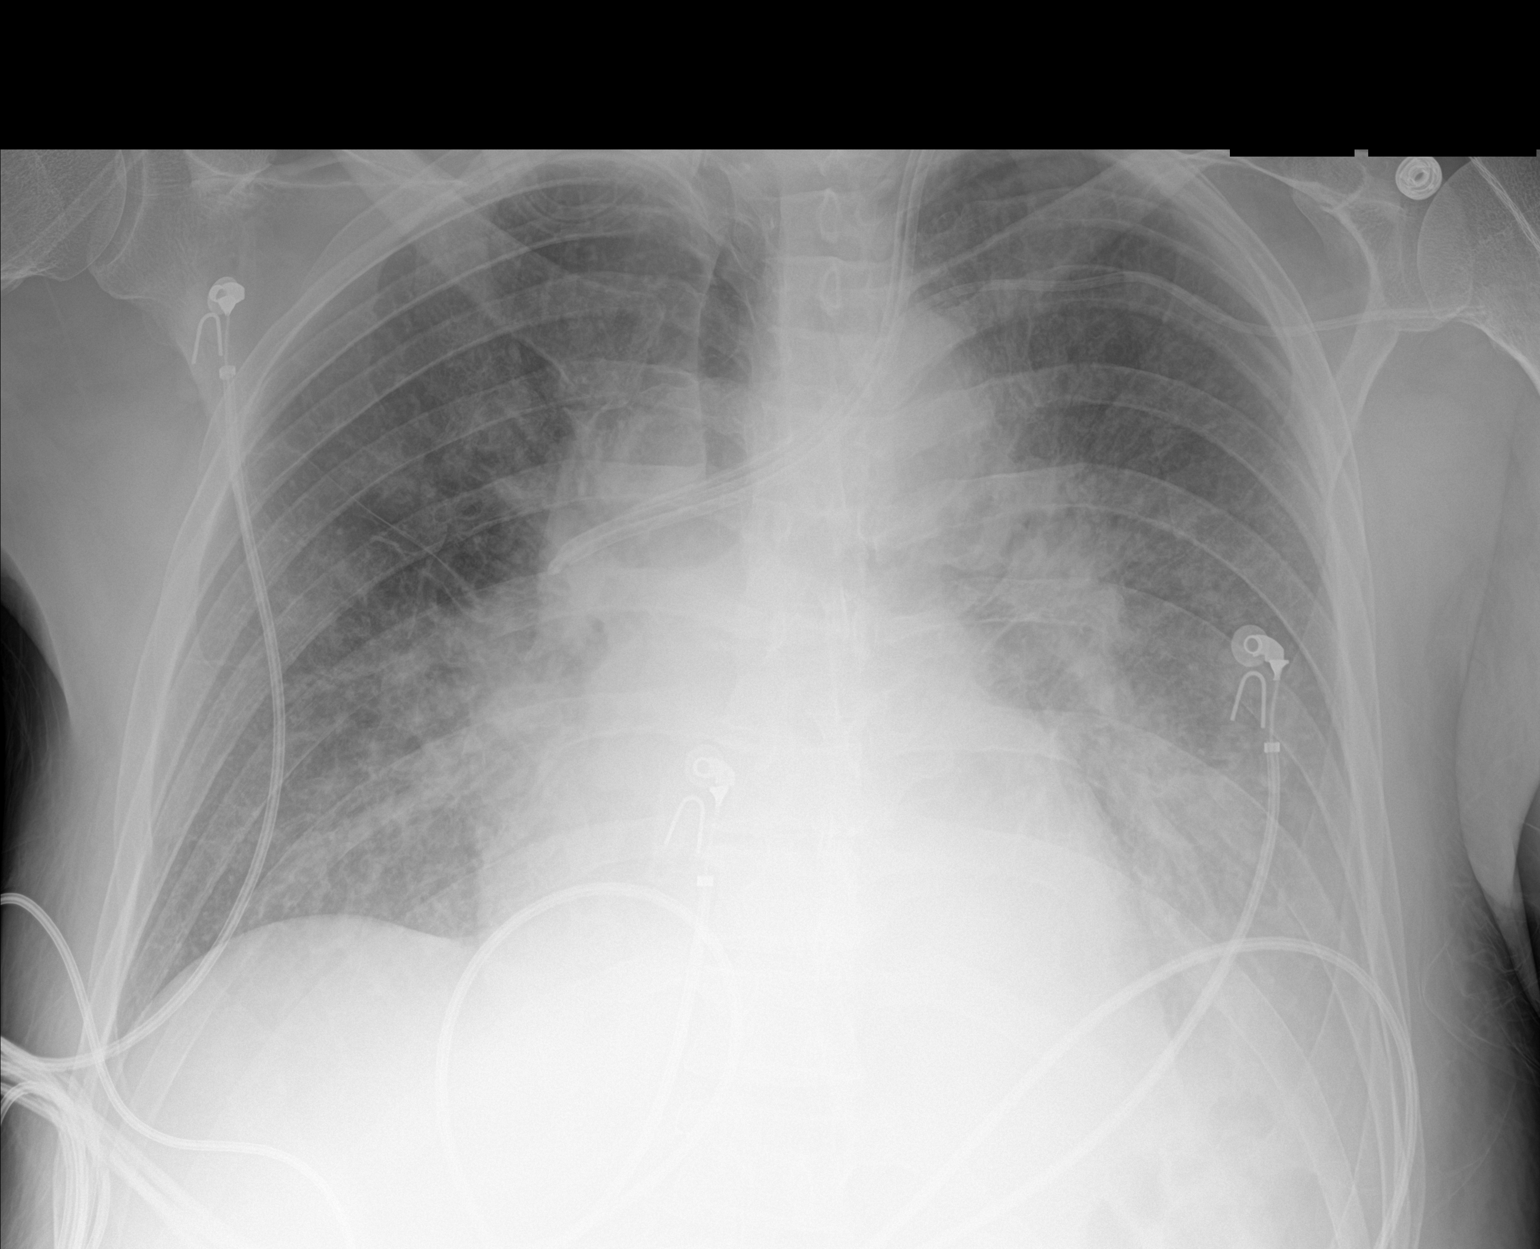

[1 of 1 positions shown; findings below may reference images not displayed]

FINDINGS: Dual lumen left IJ catheter, left subclavian catheter stable
position. Cardiomegaly with persistent bilateral pulmonary
infiltrates/edema. Small left pleural effusion. Similar findings
noted on prior exam . No pneumothorax .
IMPRESSION: 1. Dual lumen left IJ catheter, and left subclavian line in stable
position .

2. Cardiomegaly with persistent unchanged bilateral pulmonary
infiltrates consistent pulmonary edema and/or pneumonia. Small left
pleural effusion.

## 2016-12-16 IMAGING — DX DG CHEST 1V PORT
1 series · 1 of 1 positions shown · non-contrast
Comparison: Portable chest x-ray September 14, 2015

CLINICAL DATA: Low-grade fever, acute renal failure, acute
respiratory failure, hypovolemic shock.

EXAM:
PORTABLE CHEST 1 VIEW

[chest ap]
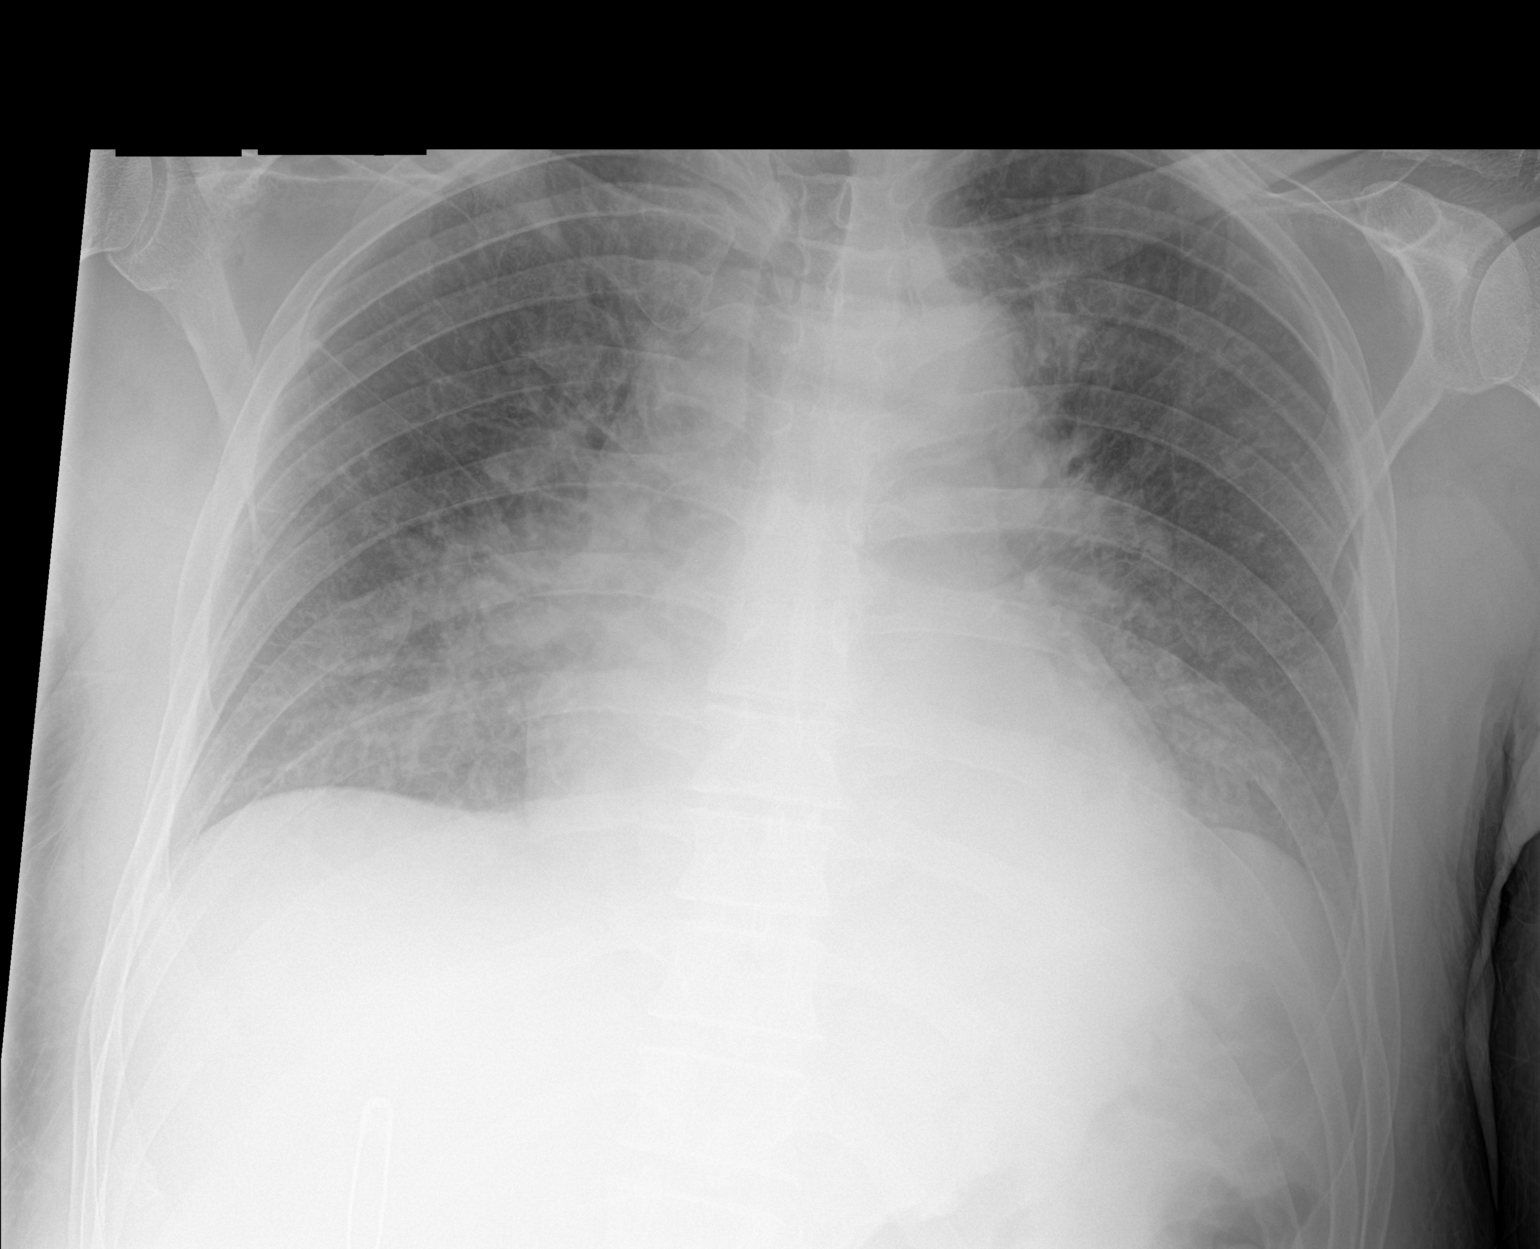

[1 of 1 positions shown; findings below may reference images not displayed]

FINDINGS: The lungs are reasonably well inflated. The pulmonary interstitial
markings remain increased. The cardiac silhouette remains enlarged.
The pulmonary vascularity remains engorged and indistinct. The
retrocardiac region on the left is less dense and there is better
visualization of the left hemidiaphragm. Interval full removal of
left internal jugular and subclavian venous catheters.
IMPRESSION: CHF with mild pulmonary interstitial edema. Slight improvement in
left lower lobe atelectasis.

## 2017-01-11 ENCOUNTER — Other Ambulatory Visit: Payer: Self-pay | Admitting: Family Medicine

## 2017-01-11 DIAGNOSIS — E785 Hyperlipidemia, unspecified: Secondary | ICD-10-CM

## 2017-01-23 ENCOUNTER — Other Ambulatory Visit (INDEPENDENT_AMBULATORY_CARE_PROVIDER_SITE_OTHER): Payer: BLUE CROSS/BLUE SHIELD

## 2017-01-23 DIAGNOSIS — E785 Hyperlipidemia, unspecified: Secondary | ICD-10-CM | POA: Diagnosis not present

## 2017-01-23 DIAGNOSIS — Z1211 Encounter for screening for malignant neoplasm of colon: Secondary | ICD-10-CM | POA: Diagnosis not present

## 2017-01-23 LAB — COMPREHENSIVE METABOLIC PANEL
ALBUMIN: 4.3 g/dL (ref 3.5–5.2)
ALK PHOS: 47 U/L (ref 39–117)
ALT: 20 U/L (ref 0–53)
AST: 20 U/L (ref 0–37)
BILIRUBIN TOTAL: 0.4 mg/dL (ref 0.2–1.2)
BUN: 22 mg/dL (ref 6–23)
CO2: 27 mEq/L (ref 19–32)
CREATININE: 1.28 mg/dL (ref 0.40–1.50)
Calcium: 9.7 mg/dL (ref 8.4–10.5)
Chloride: 106 mEq/L (ref 96–112)
GFR: 62.41 mL/min (ref >60.00)
Glucose, Bld: 106 mg/dL — ABNORMAL HIGH (ref 70–99)
POTASSIUM: 4.4 meq/L (ref 3.5–5.1)
SODIUM: 141 meq/L (ref 135–145)
TOTAL PROTEIN: 6.7 g/dL (ref 6.0–8.3)

## 2017-01-23 LAB — LIPID PANEL
CHOLESTEROL: 195 mg/dL (ref 0–200)
HDL: 46.9 mg/dL (ref >39.00)
LDL CALC: 136 mg/dL — AB (ref 0–99)
NonHDL: 148.56
Total CHOL/HDL Ratio: 4
Triglycerides: 62 mg/dL (ref 0.0–149.0)
VLDL: 12.4 mg/dL (ref 0.0–40.0)

## 2017-01-23 NOTE — Addendum Note (Signed)
Addended by: Gregery Na on: 01/23/2017 09:27 AM   Modules accepted: Orders

## 2017-01-28 ENCOUNTER — Ambulatory Visit (INDEPENDENT_AMBULATORY_CARE_PROVIDER_SITE_OTHER): Payer: BLUE CROSS/BLUE SHIELD | Admitting: Family Medicine

## 2017-01-28 ENCOUNTER — Encounter: Payer: Self-pay | Admitting: Family Medicine

## 2017-01-28 VITALS — BP 104/62 | HR 72 | Temp 98.8°F | Ht 72.25 in | Wt 193.8 lb

## 2017-01-28 DIAGNOSIS — G6281 Critical illness polyneuropathy: Secondary | ICD-10-CM

## 2017-01-28 DIAGNOSIS — Z0000 Encounter for general adult medical examination without abnormal findings: Secondary | ICD-10-CM | POA: Diagnosis not present

## 2017-01-28 DIAGNOSIS — K439 Ventral hernia without obstruction or gangrene: Secondary | ICD-10-CM

## 2017-01-28 DIAGNOSIS — Z1211 Encounter for screening for malignant neoplasm of colon: Secondary | ICD-10-CM

## 2017-01-28 DIAGNOSIS — Z7189 Other specified counseling: Secondary | ICD-10-CM

## 2017-01-28 NOTE — Patient Instructions (Signed)
Shirlee LimerickMarion will call about your referral. Flu shot later this fall.  I'll check with neurology in the meantime.  Take care.  Glad to see you.

## 2017-01-28 NOTE — Progress Notes (Signed)
CPE- See plan.  Routine anticipatory guidance given to patient.  See health maintenance.  The possibility exists that previously documented standard health maintenance information may have been brought forward from a previous encounter into this note.  If needed, that same information has been updated to reflect the current situation based on today's encounter.    Tetanus ~2010. Flu to be done this fall.   PNA not due  Shingles not due D/w patient ME:QASTMHD for colon cancer screening, including IFOB vs. colonoscopy. Risks and benefits of both were discussed and patient voiced understanding. Pt elects for: colonoscopy.    Prostate cancer screening and PSA options(with potential risks and benefits of testing vs not testing) were discussed along with recent recs/guidelines. He declined testing PSAat this point.  He'll consider testing in the meantime.   Living will d/w pt. Wife designated if patient were incapacitated.  Diet and exercise d/w pt.  Walking more, event with pain from the neuropathy.  He is working on diet, d/w pt about diet with his work schedule.   HIV and HCV prev neg, d/w pt.    He tried lyrica, gabapentin, cymbalta, amitriptyline, w/o relief of neuropathic pain (except for lyrica helped some but he was falling asleep on the med even at low doses).  Accupuncture didn't help.  Pain is worse at night.  Burning and tinging in the feet.  He can feel the floor with walking but "they feel asleep" .  Still with R 3rd-5th tingling on the R hand.  He feels like his legs get more tired and his feet swell more in the afternoon.    PMH and SH reviewed  Meds, vitals, and allergies reviewed.   ROS: Per HPI.  Unless specifically indicated otherwise in HPI, the patient denies:  General: fever. Eyes: acute vision changes ENT: sore throat Cardiovascular: chest pain Respiratory: SOB GI: vomiting GU: dysuria Musculoskeletal: acute back pain Derm: acute rash Neuro: acute motor  dysfunction Psych: worsening mood Endocrine: polydipsia Heme: bleeding Allergy: hayfever  GEN: nad, alert and oriented HEENT: mucous membranes moist NECK: supple w/o LA CV: rrr. PULM: ctab, no inc wob ABD: soft, +bs, small abdominal wall hernia noted superior and to the right of the umbilicus. Soft, easily reduced. EXT: no edema SKIN: no acute rash

## 2017-01-29 DIAGNOSIS — K439 Ventral hernia without obstruction or gangrene: Secondary | ICD-10-CM | POA: Insufficient documentation

## 2017-01-29 NOTE — Assessment & Plan Note (Signed)
He tried lyrica, gabapentin, cymbalta, amitriptyline, w/o relief of neuropathic pain (except for lyrica helped some but he was falling asleep on the med even at low doses).  Accupuncture didn't help.  I'll ask for neuro input re: options.

## 2017-01-29 NOTE — Assessment & Plan Note (Signed)
Living will d/w pt.  Wife designated if patient were incapacitated.   ?

## 2017-01-29 NOTE — Assessment & Plan Note (Signed)
Tetanus ~2010. Flu to be done this fall.   PNA not due  Shingles not due D/w patient QM:VHQIONGre:options for colon cancer screening, including IFOB vs. colonoscopy. Risks and benefits of both were discussed and patient voiced understanding. Pt elects for: colonoscopy.    Prostate cancer screening and PSA options(with potential risks and benefits of testing vs not testing) were discussed along with recent recs/guidelines. He declined testing PSAat this point.  He'll consider testing in the meantime.   Living will d/w pt. Wife designated if patient were incapacitated.  Diet and exercise d/w pt.  Walking more, event with pain from the neuropathy.  He is working on diet, d/w pt about diet with his work schedule.   HIV and HCV prev neg, d/w pt.

## 2017-01-29 NOTE — Assessment & Plan Note (Signed)
Discussed with patient about options. Present since about 2013 per patient report.  Not worse recently. Anatomy discussed with patient,  routine cautions given. We can refer to general surgery if and when he desires repair. He will let me know. He did not want referral at this point.

## 2017-02-02 ENCOUNTER — Telehealth: Payer: Self-pay | Admitting: Family Medicine

## 2017-02-02 MED ORDER — LIDOCAINE 5 % EX PTCH: 1.0000 | MEDICATED_PATCH | CUTANEOUS | 0 refills | Status: DC

## 2017-02-02 NOTE — Telephone Encounter (Signed)
See below.  See if patient can get lidocaine patch approved.  rx sent.  Notify pt.  Thanks.

## 2017-02-02 NOTE — Telephone Encounter (Signed)
-----   Message from Suanne MarkerVikram R Penumalli, MD sent at 01/29/2017  4:58 PM EDT ----- Lidocaine patch is ok. Lidocaine topical gel / cream ok. Also compounded neuropathy creams are available.  If pain is very severe and continues, then could consider narcotics via pain mgmt clinic, but not ideal.  -VRP  ----- Message ----- From: Joaquim Namuncan, Ramirez Fullbright S, MD Sent: 01/29/2017   5:43 AM To: Suanne MarkerVikram R Penumalli, MD  Do you have any other ideas about his neuropathy treatment? Is reasonable to try a lidocaine patch?  He tried lyrica, gabapentin, cymbalta, amitriptyline, w/o relief of neuropathic pain (except for lyrica helped some but he was falling asleep on the med even at low doses).  Accupuncture didn't help.   Thanks.

## 2017-02-03 NOTE — Telephone Encounter (Signed)
Patient notified as instructed by telephone and verbalized understanding. Patient stated that he has gotten a text from his pharmacy showing that they have filled a prescription for him. Advised patient if he finds out that he needs a prior authorization have the pharmacy send over the paperwork to start the process.

## 2017-02-04 ENCOUNTER — Encounter: Payer: Self-pay | Admitting: Family Medicine

## 2017-03-31 ENCOUNTER — Encounter: Payer: Self-pay | Admitting: Family Medicine

## 2017-04-03 ENCOUNTER — Ambulatory Visit (INDEPENDENT_AMBULATORY_CARE_PROVIDER_SITE_OTHER): Payer: BLUE CROSS/BLUE SHIELD

## 2017-04-03 DIAGNOSIS — Z23 Encounter for immunization: Secondary | ICD-10-CM

## 2017-06-30 ENCOUNTER — Telehealth: Payer: Self-pay | Admitting: Gastroenterology

## 2017-06-30 ENCOUNTER — Other Ambulatory Visit: Payer: Self-pay

## 2017-06-30 DIAGNOSIS — Z1211 Encounter for screening for malignant neoplasm of colon: Secondary | ICD-10-CM

## 2017-06-30 NOTE — Telephone Encounter (Signed)
Gastroenterology Pre-Procedure Review  Request Date: 02/119/19 Requesting Physician: Dr. Tobi BastosAnna  PATIENT REVIEW QUESTIONS: The patient responded to the following health history questions as indicated:    1. Are you having any GI issues? no 2. Do you have a personal history of Polyps? no 3. Do you have a family history of Colon Cancer or Polyps? yes (father and grandmother polyp) 4. Diabetes Mellitus? no 5. Joint replacements in the past 12 months?no 6. Major health problems in the past 3 months?no 7. Any artificial heart valves, MVP, or defibrillator?no    MEDICATIONS & ALLERGIES:    Patient reports the following regarding taking any anticoagulation/antiplatelet therapy:   Plavix, Coumadin, Eliquis, Xarelto, Lovenox, Pradaxa, Brilinta, or Effient? no Aspirin? no  Patient confirms/reports the following medications:  Current Outpatient Medications  Medication Sig Dispense Refill  . lidocaine (LIDODERM) 5 % Place 1 patch onto the skin daily. Remove & Discard patch within 12 hours or as directed by MD 30 patch 0   No current facility-administered medications for this visit.     Patient confirms/reports the following allergies:  Allergies  Allergen Reactions  . Cymbalta [Duloxetine Hcl]     Ineffective for pain.   Marland Kitchen. Desloratadine     Ineffective   . Fexofenadine     Ineffective   . Gabapentin     intolerant  . Loratadine     Ineffective   . Lyrica [Pregabalin]     Intolerant, sedation    No orders of the defined types were placed in this encounter.   AUTHORIZATION INFORMATION Primary Insurance: 1D#: Group #:  Secondary Insurance: 1D#: Group #:  SCHEDULE INFORMATION: Date: 07/21/17 Time: Location:[facility]

## 2017-06-30 NOTE — Telephone Encounter (Signed)
Patient called back and is ready to schedule procedure.

## 2017-07-17 ENCOUNTER — Encounter: Payer: Self-pay | Admitting: Family Medicine

## 2017-07-17 ENCOUNTER — Ambulatory Visit: Payer: BLUE CROSS/BLUE SHIELD | Admitting: Family Medicine

## 2017-07-17 DIAGNOSIS — M549 Dorsalgia, unspecified: Secondary | ICD-10-CM | POA: Diagnosis not present

## 2017-07-17 MED ORDER — CYCLOBENZAPRINE HCL 10 MG PO TABS: 5.0000 mg | ORAL_TABLET | Freq: Three times a day (TID) | ORAL | 0 refills | Status: DC | PRN

## 2017-07-17 NOTE — Progress Notes (Signed)
He is putting up with the foot and hand neuropathy/pain.  "It's doable" but not pleasant.  He is trying to exercise.    He started having back pain that was worse than the foot pain.  Going on for at least the last month.  The attributed it to a positional issue with sleep after a pillow change.  No trauma.  He drives a lot during the day and has trouble getting comfortable in the car.  Triangular area near the R shoulder blade, with the vertex superiorly.  No L sided sx.  No rash.  No redness.  No clear trigger for the pain.  Normal grip.  No upper neck pain.  No help with ibuprofen.  No FCNAVD.  He doesn't feel unwell o/w.    Meds, vitals, and allergies reviewed.   ROS: Per HPI unless specifically indicated in ROS section   nad ncat Neck supple, no LA rrr ctab R scapular area ttp without bruising or rash. No midline back pain. No arm drop, normal R shoulder ROM No dermatomal rash.   Normal right hand grip.  Normal Right radial pulse.

## 2017-07-17 NOTE — Patient Instructions (Addendum)
Try ice vs heat for the upper back pain.   Try flexeril as needed for spasms.   Take care.  Glad to see you.   Update me as needed.

## 2017-07-19 DIAGNOSIS — M549 Dorsalgia, unspecified: Secondary | ICD-10-CM | POA: Insufficient documentation

## 2017-07-19 NOTE — Assessment & Plan Note (Signed)
Appears to be a muscular issue near the scapula.  He does not have typical rotator cuff symptoms.  It does not look like the shoulder is the primary issue here.  He does not have midline back pain or radicular symptoms.  Discussed with patient about anatomy and options. Try ice vs heat for the pain.   Try flexeril as needed for spasms.   Update me as needed.   Routine cautions given on Flexeril.  He agrees.

## 2017-07-20 ENCOUNTER — Encounter: Payer: Self-pay | Admitting: Emergency Medicine

## 2017-07-21 ENCOUNTER — Ambulatory Visit: Payer: BLUE CROSS/BLUE SHIELD | Admitting: Anesthesiology

## 2017-07-21 ENCOUNTER — Ambulatory Visit
Admission: RE | Admit: 2017-07-21 | Discharge: 2017-07-21 | Disposition: A | Discharge status: Home / Self Care | Payer: BLUE CROSS/BLUE SHIELD | Source: Ambulatory Visit | Attending: Gastroenterology | Admitting: Gastroenterology

## 2017-07-21 ENCOUNTER — Other Ambulatory Visit: Payer: Self-pay

## 2017-07-21 ENCOUNTER — Encounter
Admission: RE | Disposition: A | Discharge status: Home / Self Care | Payer: Self-pay | Source: Ambulatory Visit | Attending: Gastroenterology

## 2017-07-21 ENCOUNTER — Encounter: Payer: Self-pay | Admitting: *Deleted

## 2017-07-21 DIAGNOSIS — Z1211 Encounter for screening for malignant neoplasm of colon: Secondary | ICD-10-CM | POA: Insufficient documentation

## 2017-07-21 DIAGNOSIS — G6281 Critical illness polyneuropathy: Secondary | ICD-10-CM | POA: Insufficient documentation

## 2017-07-21 DIAGNOSIS — Z79899 Other long term (current) drug therapy: Secondary | ICD-10-CM | POA: Diagnosis not present

## 2017-07-21 DIAGNOSIS — D649 Anemia, unspecified: Secondary | ICD-10-CM | POA: Diagnosis not present

## 2017-07-21 DIAGNOSIS — J9600 Acute respiratory failure, unspecified whether with hypoxia or hypercapnia: Secondary | ICD-10-CM | POA: Diagnosis not present

## 2017-07-21 DIAGNOSIS — G629 Polyneuropathy, unspecified: Secondary | ICD-10-CM | POA: Diagnosis not present

## 2017-07-21 HISTORY — PX: COLONOSCOPY WITH PROPOFOL: SHX5780

## 2017-07-21 SURGERY — COLONOSCOPY WITH PROPOFOL
Anesthesia: General

## 2017-07-21 MED ORDER — PROPOFOL 500 MG/50ML IV EMUL
INTRAVENOUS | Status: DC | PRN
  Administered 2017-07-21: 100 ug/kg/min via INTRAVENOUS

## 2017-07-21 MED ORDER — PROPOFOL 10 MG/ML IV BOLUS
INTRAVENOUS | Status: AC
  Filled 2017-07-21: qty 40

## 2017-07-21 MED ORDER — SODIUM CHLORIDE 0.9 % IV SOLN
INTRAVENOUS | Status: DC
  Administered 2017-07-21: 09:00:00 via INTRAVENOUS

## 2017-07-21 MED ORDER — LIDOCAINE HCL (CARDIAC) 20 MG/ML IV SOLN
INTRAVENOUS | Status: DC | PRN
  Administered 2017-07-21: 80 mg via INTRAVENOUS

## 2017-07-21 MED ORDER — PROPOFOL 10 MG/ML IV BOLUS
INTRAVENOUS | Status: AC
  Filled 2017-07-21: qty 20

## 2017-07-21 MED ORDER — PROPOFOL 10 MG/ML IV BOLUS
INTRAVENOUS | Status: DC | PRN
  Administered 2017-07-21 (×3): 100 mg via INTRAVENOUS

## 2017-07-21 NOTE — Anesthesia Postprocedure Evaluation (Signed)
Anesthesia Post Note  Patient: John Hoover  Procedure(s) Performed: COLONOSCOPY WITH PROPOFOL (N/A )  Patient location during evaluation: PACU Anesthesia Type: General Level of consciousness: awake and alert Pain management: pain level controlled Vital Signs Assessment: post-procedure vital signs reviewed and stable Respiratory status: spontaneous breathing, nonlabored ventilation, respiratory function stable and patient connected to nasal cannula oxygen Cardiovascular status: blood pressure returned to baseline and stable Postop Assessment: no apparent nausea or vomiting Anesthetic complications: no     Last Vitals:  Vitals:   07/21/17 0942 07/21/17 0952  BP: (!) 130/98   Pulse:    Resp:    Temp: (!) 36.3 C   SpO2:  100%    Last Pain:  Vitals:   07/21/17 0942  TempSrc: Tympanic                 Yevette EdwardsJames G Yadriel Kerrigan

## 2017-07-21 NOTE — Transfer of Care (Signed)
Immediate Anesthesia Transfer of Care Note  Patient: John Hoover  Procedure(s) Performed: COLONOSCOPY WITH PROPOFOL (N/A )  Patient Location: PACU  Anesthesia Type:General  Level of Consciousness: awake, alert  and oriented  Airway & Oxygen Therapy: Patient Spontanous Breathing and Patient connected to nasal cannula oxygen  Post-op Assessment: Report given to RN  Post vital signs: Reviewed and stable  Last Vitals:  Vitals:   07/21/17 0823 07/21/17 0942  BP: 138/88   Pulse: (!) 103   Resp: 20   Temp: (!) 35.4 C (!) (P) 36.3 C  SpO2: 100%     Last Pain:  Vitals:   07/21/17 0942  TempSrc: (P) Tympanic      Patients Stated Pain Goal: 7 (07/21/17 [id number])  Complications: No apparent anesthesia complications

## 2017-07-21 NOTE — Anesthesia Preprocedure Evaluation (Signed)
Anesthesia Evaluation  Patient identified by MRN, date of birth, ID band Patient awake    Reviewed: Allergy & Precautions, H&P , NPO status , Patient's Chart, lab work & pertinent test results, reviewed documented beta blocker date and time   Airway Mallampati: II   Neck ROM: full    Dental  (+) Poor Dentition   Pulmonary neg pulmonary ROS,    Pulmonary exam normal        Cardiovascular Exercise Tolerance: Good negative cardio ROS Normal cardiovascular exam Rhythm:regular Rate:Normal     Neuro/Psych  Neuromuscular disease negative neurological ROS  negative psych ROS   GI/Hepatic negative GI ROS, Neg liver ROS,   Endo/Other  negative endocrine ROS  Renal/GU Renal diseasenegative Renal ROS  negative genitourinary   Musculoskeletal   Abdominal   Peds  Hematology negative hematology ROS (+) anemia ,   Anesthesia Other Findings Past Medical History: No date: AKI (acute kidney injury) (HCC) No date: Anemia 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) No date: Flu No date: Meniere's disease No date: Neuropathy No date: Physical deconditioning No date: Respiratory failure (HCC)     Comment:  VDRF flu 2017 No date: Rhabdomyolysis Past Surgical History: No date: NASAL SINUS SURGERY No date: TONSILLECTOMY No date: WISDOM TOOTH EXTRACTION   Reproductive/Obstetrics negative OB ROS                             Anesthesia Physical Anesthesia Plan  ASA: III  Anesthesia Plan: General   Post-op Pain Management:    Induction:   PONV Risk Score and Plan:   Airway Management Planned:   Additional Equipment:   Intra-op Plan:   Post-operative Plan:   Informed Consent: I have reviewed the patients History and Physical, chart, labs and discussed the procedure including the risks, benefits and alternatives for the proposed anesthesia with the patient or authorized representative who has  indicated his/her understanding and acceptance.   Dental Advisory Given  Plan Discussed with: CRNA  Anesthesia Plan Comments:         Anesthesia Quick Evaluation

## 2017-07-21 NOTE — Anesthesia Post-op Follow-up Note (Signed)
Anesthesia QCDR form completed.        

## 2017-07-21 NOTE — Op Note (Signed)
[facility] Gastroenterology Patient Name: John Hoover Procedure Date: 07/21/2017 9:12 AM MRN: [id number] Account #: [id number] Date of Birth: 06-17-1965 Admit Type: Outpatient Age: 52 Room: [facility] ENDO ROOM 1 Gender: Male Note Status: Finalized Procedure:            Colonoscopy Indications:          Screening for colorectal malignant neoplasm Providers:            Wyline Mood MD, MD Referring MD:         Dwana Curd. Para March, MD (Referring MD) Medicines:            Monitored Anesthesia Care Complications:        No immediate complications. Procedure:            Pre-Anesthesia Assessment:                       - Prior to the procedure, a History and Physical was                        performed, and patient medications, allergies and                        sensitivities were reviewed. The patient's tolerance of                        previous anesthesia was reviewed.                       - The risks and benefits of the procedure and the                        sedation options and risks were discussed with the                        patient. All questions were answered and informed                        consent was obtained.                       - ASA Grade Assessment: III - A patient with severe                        systemic disease.                       After obtaining informed consent, the colonoscope was                        passed under direct vision. Throughout the procedure,                        the patient's blood pressure, pulse, and oxygen                        saturations were monitored continuously. The                        Colonoscope was introduced through the anus and  advanced to the the cecum, identified by the                        appendiceal orifice, IC valve and transillumination.                        The colonoscopy was performed with ease. The patient                        tolerated the procedure well. The  quality of the bowel                        preparation was poor. Findings:      The perianal and digital rectal examinations were normal.      Extensive amounts of semi-liquid stool was found in the entire colon. Impression:           - Preparation of the colon was poor.                       - Stool in the entire examined colon.                       - No specimens collected. Recommendation:       - Discharge patient to home (with escort).                       - Resume previous diet.                       - Continue present medications.                       - Repeat colonoscopy in 2 months because the bowel                        preparation was suboptimal. Procedure Code(s):    --- Professional ---                       [id number], Colonoscopy, flexible; diagnostic, including                        collection of specimen(s) by brushing or washing, when                        performed (separate procedure) Diagnosis Code(s):    --- Professional ---                       Z12.11, Encounter for screening for malignant neoplasm                        of colon CPT copyright 2016 [employer]. All rights reserved. The codes documented in this report are preliminary and upon coder review may  be revised to meet current compliance requirements. Wyline MoodKiran Debara Kamphuis, MD Wyline MoodKiran Arjuna Doeden MD, MD 07/21/2017 9:40:05 AM This report has been signed electronically. Number of Addenda: 0 Note Initiated On: 07/21/2017 9:12 AM Scope Withdrawal Time: 0 hours 13 minutes 16 seconds  Total Procedure Duration: 0 hours 21 minutes 30 seconds       [facility]

## 2017-07-21 NOTE — H&P (Signed)
John MoodKiran Billie Trager, MD [address], [address], [place], [place], [id number] [address], [address], [place], [place], [id number] Phone: [phone]  Fax: [phone]  Primary Care Physician:  Joaquim Namuncan, Graham S, MD   Pre-Procedure History & Physical: HPI:  [name] is a 52 y.o. male is here for an colonoscopy.   Past Medical History:  Diagnosis Date  . AKI (acute kidney injury) (HCC)   . Anemia   . Critical illness polyneuropathy (HCC)   . Flu   . Meniere's disease   . Neuropathy   . Physical deconditioning   . Respiratory failure (HCC)    VDRF flu 2017  . Rhabdomyolysis     Past Surgical History:  Procedure Laterality Date  . NASAL SINUS SURGERY    . TONSILLECTOMY    . WISDOM TOOTH EXTRACTION      Prior to Admission medications   Medication Sig Start Date End Date Taking? Authorizing Provider  cyclobenzaprine (FLEXERIL) 10 MG tablet Take 0.5-1 tablets (5-10 mg total) by mouth 3 (three) times daily as needed for muscle spasms (sedation caution). 07/17/17  Yes Joaquim Namuncan, Graham S, MD    Allergies as of 06/30/2017 - Review Complete 01/28/2017  Allergen Reaction Noted  . Cymbalta [duloxetine hcl]  01/28/2017  . Desloratadine    . Fexofenadine    . Gabapentin  01/28/2017  . Loratadine    . Lyrica [pregabalin]  01/28/2017    Family History  Problem Relation Age of Onset  . Hypertension Mother   . Cancer Mother        breast  . Hypertension Father   . Colon cancer Neg Hx   . Prostate cancer Neg Hx     Social History   Socioeconomic History  . Marital status: Married    Spouse name: Not on file  . Number of children: 2  . Years of education: 7016  . Highest education level: Not on file  Social Needs  . Financial resource strain: Not on file  . Food insecurity - worry: Not on file  . Food insecurity - inability: Not on file  . Transportation needs - medical: Not on file  . Transportation needs - non-medical: Not on file  Occupational History  .  Occupation: Works in Product/process development scientistconcrete/ building    Comment: [employer]  Tobacco Use  . Smoking status: Never Smoker  . Smokeless tobacco: Never Used  Substance and Sexual Activity  . Alcohol use: Yes    Comment: weekends  . Drug use: No  . Sexual activity: Not on file  Other Topics Concern  . Not on file  Social History Narrative   1 daughter (who has DM1) and 1 son   Married 25+ years   Caffeine- sodas    Review of Systems: See HPI, otherwise negative ROS  Physical Exam: BP 138/88   Pulse (!) 103   Temp (!) 95.8 F (35.4 C) (Tympanic)   Resp 20   Ht 6\' 1"  (1.854 m)   Wt 200 lb (90.7 kg)   SpO2 100%   BMI 26.39 kg/m  General:   Alert,  pleasant and cooperative in NAD Head:  Normocephalic and atraumatic. Neck:  Supple; no masses or thyromegaly. Lungs:  Clear throughout to auscultation, normal respiratory effort.    Heart:  +S1, +S2, Regular rate and rhythm, No edema. Abdomen:  Soft, nontender and nondistended. Normal bowel sounds, without guarding, and without rebound.   Neurologic:  Alert and  oriented x4;  grossly normal neurologically.  Impression/Plan: John Hoover is here for an colonoscopy to be performed for Screening colonoscopy high risk.    Risks, benefits, limitations, and alternatives regarding  colonoscopy have been reviewed with the patient.  Questions have been answered.  All parties agreeable.   John Mood, MD  07/21/2017, 8:57 AM

## 2017-07-30 ENCOUNTER — Encounter: Payer: Self-pay | Admitting: Family Medicine

## 2017-07-31 ENCOUNTER — Telehealth: Payer: Self-pay

## 2017-07-31 ENCOUNTER — Telehealth: Payer: Self-pay | Admitting: Family Medicine

## 2017-07-31 ENCOUNTER — Other Ambulatory Visit: Payer: Self-pay

## 2017-07-31 NOTE — Telephone Encounter (Signed)
LVM for patient callback to schedule repeat colonoscopy due to poor colon prep.   Schedule colonoscopy and give 2 day prep.  Sent message via MyChart

## 2017-07-31 NOTE — Telephone Encounter (Signed)
See mychart message and see if this patient can come in at [facility] today.  Thanks.

## 2017-07-31 NOTE — Telephone Encounter (Signed)
Pt said that he is out of town for work and would like to get on the schedule for Monday.... I went ahead and placed him in the 10:30 slot for Mon... [place] and you can email him back with any suggestions to help until he comes in on Monday

## 2017-07-31 NOTE — Telephone Encounter (Signed)
Apparently patient is out of town and couldn't come in.  Please call him, in case he doesn't get his mychart message.  I get his point about prednisone but I don't think it is a good idea to put him on steroids without having someone check his ear first.  Please advise him to get set up at an urgent care this weekend.  Thanks.

## 2017-07-31 NOTE — Telephone Encounter (Signed)
Spoke to pt. He said he is going to try and make it until his appt Monday

## 2017-08-03 ENCOUNTER — Encounter: Payer: Self-pay | Admitting: Family Medicine

## 2017-08-03 ENCOUNTER — Ambulatory Visit: Payer: BLUE CROSS/BLUE SHIELD | Admitting: Family Medicine

## 2017-08-03 VITALS — BP 120/78 | HR 76 | Temp 98.5°F | Wt 208.0 lb

## 2017-08-03 DIAGNOSIS — M549 Dorsalgia, unspecified: Secondary | ICD-10-CM

## 2017-08-03 DIAGNOSIS — H6980 Other specified disorders of Eustachian tube, unspecified ear: Secondary | ICD-10-CM | POA: Diagnosis not present

## 2017-08-03 MED ORDER — FLUTICASONE PROPIONATE 50 MCG/ACT NA SUSP: 2.0000 | Freq: Every day | NASAL | 1 refills | Status: DC

## 2017-08-03 NOTE — Patient Instructions (Addendum)
Gently stretch your upper back and try an OTC liniment.   Try flonase and gently pop your ears.  Update me as needed.   Take care.  Glad to see you.

## 2017-08-03 NOTE — Telephone Encounter (Signed)
Please call patient back to reschedule his colonoscopy

## 2017-08-03 NOTE — Progress Notes (Signed)
Hearing loss with "like the ocean is in my ear with a seashell."  L ear affected.  Not dizzy now or prev.  No pain.  Ear feels stuffy.  No ear drainage.  No FCNAVD.  He doesn't feel sick.    He tried flexeril for his back pain w/o relief.  He tried different pillows and adjusting his seat w/o relief.  He hasn't tried liniment.  No radiation, still with sx in a triangular area near the R shoulder blade.    Meds, vitals, and allergies reviewed.   ROS: Per HPI unless specifically indicated in ROS section   nad ncat TM wnl B except for sluggish L TM movement on valsalva.  Air>bone conduction B with weber equal B ears.   Nasal and OP exam wnl Mmm Sinuses not ttp Neck supple, no LA R scap area with local tenderness and spasm.

## 2017-08-04 DIAGNOSIS — H6980 Other specified disorders of Eustachian tube, unspecified ear: Secondary | ICD-10-CM | POA: Insufficient documentation

## 2017-08-04 NOTE — Assessment & Plan Note (Signed)
Likely causing some symptoms on the left ear.  Discussed with patient about gentle Valsalva maneuver and using Flonase.  Update me as needed. He agrees.

## 2017-08-04 NOTE — Assessment & Plan Note (Signed)
Discussed with patient about stretching and using an over-the-counter liniment.  Update me as needed.  He agrees.

## 2017-08-05 ENCOUNTER — Telehealth: Payer: Self-pay

## 2017-08-05 NOTE — Telephone Encounter (Signed)
Gastroenterology Pre-Procedure Review  Request Date: 09/22/17 Requesting Physician: Dr. Tobi BastosAnna   PATIENT REVIEW QUESTIONS: The patient responded to the following health history questions as indicated:    1. Are you having any GI issues? No  2. Do you have a personal history of Polyps? No  3. Do you have a family history of Colon Cancer or Polyps? Yes, polyp-father, grandmother 4. Diabetes Mellitus? No  5. Joint replacements in the past 12 months? No  6. Major health problems in the past 3 months? No  7. Any artificial heart valves, MVP, or defibrillator? No     MEDICATIONS & ALLERGIES:    Patient reports the following regarding taking any anticoagulation/antiplatelet therapy:   Plavix, Coumadin, Eliquis, Xarelto, Lovenox, Pradaxa, Brilinta, or Effient? No  Aspirin? No  Patient confirms/reports the following medications:  Current Outpatient Medications  Medication Sig Dispense Refill  . cyclobenzaprine (FLEXERIL) 10 MG tablet Take 0.5-1 tablets (5-10 mg total) by mouth 3 (three) times daily as needed for muscle spasms (sedation caution). 30 tablet 0  . fluticasone (FLONASE) 50 MCG/ACT nasal spray Place 2 sprays into both nostrils daily. 16 g 1   No current facility-administered medications for this visit.     Patient confirms/reports the following allergies:  Allergies  Allergen Reactions  . Cymbalta [Duloxetine Hcl]     Ineffective for pain.   Marland Kitchen. Desloratadine     Ineffective   . Fexofenadine     Ineffective   . Gabapentin     intolerant  . Loratadine     Ineffective   . Lyrica [Pregabalin]     Intolerant, sedation    No orders of the defined types were placed in this encounter.   AUTHORIZATION INFORMATION Primary Insurance: 1D#: Group #:  Secondary Insurance: 1D#: Group #:  SCHEDULE INFORMATION: Date: 09/22/17 Time: Location: [facility]

## 2017-08-10 ENCOUNTER — Other Ambulatory Visit: Payer: Self-pay

## 2017-08-10 DIAGNOSIS — Z86010 Personal history of colonic polyps: Secondary | ICD-10-CM

## 2017-08-17 DIAGNOSIS — H9313 Tinnitus, bilateral: Secondary | ICD-10-CM | POA: Insufficient documentation

## 2017-08-17 DIAGNOSIS — H9193 Unspecified hearing loss, bilateral: Secondary | ICD-10-CM | POA: Diagnosis not present

## 2017-08-17 DIAGNOSIS — R42 Dizziness and giddiness: Secondary | ICD-10-CM | POA: Diagnosis not present

## 2017-08-17 DIAGNOSIS — H903 Sensorineural hearing loss, bilateral: Secondary | ICD-10-CM | POA: Diagnosis not present

## 2017-08-18 ENCOUNTER — Telehealth: Payer: Self-pay | Admitting: Gastroenterology

## 2017-08-18 NOTE — Telephone Encounter (Signed)
Patient called to cancel his colonoscopy. He doesn't have time to call his insurance company to see if they will cover the second procedure since he wasn't cleaned out for the first one. He didn't want to reschedule. He stated this was his busy time of year.

## 2017-09-22 ENCOUNTER — Ambulatory Visit: Admit: 2017-09-22 | Payer: BLUE CROSS/BLUE SHIELD | Admitting: Gastroenterology

## 2017-09-22 SURGERY — COLONOSCOPY WITH PROPOFOL
Anesthesia: General

## 2018-01-22 ENCOUNTER — Telehealth: Payer: Self-pay | Admitting: Family Medicine

## 2018-01-22 ENCOUNTER — Encounter: Payer: Self-pay | Admitting: Family Medicine

## 2018-01-22 DIAGNOSIS — E785 Hyperlipidemia, unspecified: Secondary | ICD-10-CM

## 2018-01-22 NOTE — Telephone Encounter (Signed)
Pt scheduled CPE next week. Has labs scheduled for 8/27, will need orders put in.

## 2018-01-24 NOTE — Telephone Encounter (Signed)
Orders entered. Thanks!

## 2018-01-26 ENCOUNTER — Other Ambulatory Visit (INDEPENDENT_AMBULATORY_CARE_PROVIDER_SITE_OTHER): Payer: BLUE CROSS/BLUE SHIELD

## 2018-01-26 DIAGNOSIS — E785 Hyperlipidemia, unspecified: Secondary | ICD-10-CM

## 2018-01-26 LAB — LIPID PANEL
CHOLESTEROL: 198 mg/dL (ref 0–200)
HDL: 57.2 mg/dL (ref >39.00)
LDL CALC: 129 mg/dL — AB (ref 0–99)
NonHDL: 140.78
Total CHOL/HDL Ratio: 3
Triglycerides: 58 mg/dL (ref 0.0–149.0)
VLDL: 11.6 mg/dL (ref 0.0–40.0)

## 2018-01-26 LAB — COMPREHENSIVE METABOLIC PANEL
ALBUMIN: 4.2 g/dL (ref 3.5–5.2)
ALK PHOS: 46 U/L (ref 39–117)
ALT: 17 U/L (ref 0–53)
AST: 17 U/L (ref 0–37)
BUN: 18 mg/dL (ref 6–23)
CALCIUM: 9.7 mg/dL (ref 8.4–10.5)
CHLORIDE: 105 meq/L (ref 96–112)
CO2: 26 mEq/L (ref 19–32)
Creatinine, Ser: 1.36 mg/dL (ref 0.40–1.50)
GFR: 57.97 mL/min — AB (ref >60.00)
Glucose, Bld: 100 mg/dL — ABNORMAL HIGH (ref 70–99)
POTASSIUM: 4.6 meq/L (ref 3.5–5.1)
SODIUM: 138 meq/L (ref 135–145)
Total Bilirubin: 0.5 mg/dL (ref 0.2–1.2)
Total Protein: 6.7 g/dL (ref 6.0–8.3)

## 2018-01-27 ENCOUNTER — Ambulatory Visit (INDEPENDENT_AMBULATORY_CARE_PROVIDER_SITE_OTHER): Payer: BLUE CROSS/BLUE SHIELD | Admitting: Family Medicine

## 2018-01-27 ENCOUNTER — Encounter: Payer: Self-pay | Admitting: Family Medicine

## 2018-01-27 VITALS — BP 110/76 | HR 74 | Temp 98.3°F | Ht 72.0 in | Wt 200.0 lb

## 2018-01-27 DIAGNOSIS — Z0000 Encounter for general adult medical examination without abnormal findings: Secondary | ICD-10-CM | POA: Diagnosis not present

## 2018-01-27 DIAGNOSIS — G6281 Critical illness polyneuropathy: Secondary | ICD-10-CM

## 2018-01-27 DIAGNOSIS — Z7189 Other specified counseling: Secondary | ICD-10-CM

## 2018-01-27 DIAGNOSIS — K439 Ventral hernia without obstruction or gangrene: Secondary | ICD-10-CM

## 2018-01-27 NOTE — Patient Instructions (Signed)
Avoid ibuprofen and aleve just on principle.   Tylenol if needed for pain.   I would get a flu shot each fall.   Take care.  Glad to see you.

## 2018-01-27 NOTE — Progress Notes (Signed)
CPE- See plan.  Routine anticipatory guidance given to patient.  See health maintenance.  The possibility exists that previously documented standard health maintenance information may have been brought forward from a previous encounter into this note.  If needed, that same information has been updated to reflect the current situation based on today's encounter.    Tetanus ~2010. Flu to be done this fall.   PNA not due  Shingles not due Colonoscopy d/w pt.  He'll check with GI about f/u screening in 2020.   Prostate cancer screening and PSA options(with potential risks and benefits of testing vs not testing) were discussed along with recent recs/guidelines. He declined testing PSAat this point.  Living will d/w pt. Wife designated if patient were incapacitated.  Diet and exercise d/w pt. Walking more, even with pain from the neuropathy. He is working on diet, d/w pt about diet with his work schedule.  HIV and HCV prev neg, d/w pt.   Labs d/w pt.    He has R sided abd wall hernia/defect but he is willing to tolerate that.  He was hesitant to have surgery at this point, it not absolutely needed.  He can update me as needed and I gave routine cautions.  He wants to monitor for now.  It can be sore locally but no having other abd sx. routine cautions discussed with patient.  He is putting up with current level of pain from neuropathy, worse at the end of the day.  His feel still feel like they are asleep.  He is still working.  D/w pt about his boots, footwear, etc.  He has tried mult meds w/o good tolerance. He is opting to put up with sx as is.    PMH and SH reviewed  Meds, vitals, and allergies reviewed.   ROS: Per HPI.  Unless specifically indicated otherwise in HPI, the patient denies:  General: fever. Eyes: acute vision changes ENT: sore throat Cardiovascular: chest pain Respiratory: SOB GI: vomiting GU: dysuria Musculoskeletal: acute back pain Derm: acute rash Neuro:  acute motor dysfunction Psych: worsening mood Endocrine: polydipsia Heme: bleeding Allergy: hayfever  GEN: nad, alert and oriented HEENT: mucous membranes moist NECK: supple w/o LA CV: rrr. PULM: ctab, no inc wob ABD: soft, +bs, soft likely hernia on the R side of abd wall.  EXT: no edema SKIN: no acute rash

## 2018-01-28 NOTE — Assessment & Plan Note (Signed)
Tetanus ~2010. Flu to be done this fall.   PNA not due  Shingles not due Colonoscopy d/w pt.  He'll check with GI about f/u screening in 2020.   Prostate cancer screening and PSA options(with potential risks and benefits of testing vs not testing) were discussed along with recent recs/guidelines. He declined testing PSAat this point.  Living will d/w pt. Wife designated if patient were incapacitated.  Diet and exercise d/w pt. Walking more, even with pain from the neuropathy. He is working on diet, d/w pt about diet with his work schedule.  HIV and HCV prev neg, d/w pt.

## 2018-01-28 NOTE — Assessment & Plan Note (Signed)
Routine cautions given.  He did not want to have it repaired at this time.  Reasonable to observe for now.  He will update me as needed.  We can refer to general surgery in the future if/when needed.

## 2018-01-28 NOTE — Assessment & Plan Note (Signed)
Living will d/w pt.  Wife designated if patient were incapacitated.   ?

## 2018-01-28 NOTE — Assessment & Plan Note (Signed)
He is putting up with the pain as is.  He has tried multiple medications in the past without good tolerance.  He will update me as needed.  NSAID cautions discussed with patient.

## 2018-03-25 DIAGNOSIS — X32XXXA Exposure to sunlight, initial encounter: Secondary | ICD-10-CM | POA: Diagnosis not present

## 2018-03-25 DIAGNOSIS — D2261 Melanocytic nevi of right upper limb, including shoulder: Secondary | ICD-10-CM | POA: Diagnosis not present

## 2018-03-25 DIAGNOSIS — D225 Melanocytic nevi of trunk: Secondary | ICD-10-CM | POA: Diagnosis not present

## 2018-03-25 DIAGNOSIS — D2262 Melanocytic nevi of left upper limb, including shoulder: Secondary | ICD-10-CM | POA: Diagnosis not present

## 2018-03-25 DIAGNOSIS — Z85828 Personal history of other malignant neoplasm of skin: Secondary | ICD-10-CM | POA: Diagnosis not present

## 2018-03-25 DIAGNOSIS — L570 Actinic keratosis: Secondary | ICD-10-CM | POA: Diagnosis not present

## 2018-05-31 ENCOUNTER — Ambulatory Visit: Payer: BLUE CROSS/BLUE SHIELD | Admitting: Family Medicine

## 2018-05-31 ENCOUNTER — Encounter: Payer: Self-pay | Admitting: Family Medicine

## 2018-05-31 VITALS — BP 126/84 | HR 79 | Temp 98.6°F | Ht 72.0 in | Wt 202.0 lb

## 2018-05-31 DIAGNOSIS — J019 Acute sinusitis, unspecified: Secondary | ICD-10-CM | POA: Diagnosis not present

## 2018-05-31 MED ORDER — AMOXICILLIN-POT CLAVULANATE 875-125 MG PO TABS: 1.0000 | ORAL_TABLET | Freq: Two times a day (BID) | ORAL | 0 refills | Status: AC

## 2018-05-31 NOTE — Progress Notes (Signed)
BP 126/84 (BP Location: Left Arm, Patient Position: Sitting, Cuff Size: Normal)   Pulse 79   Temp 98.6 F (37 C) (Oral)   Ht 6' (1.829 m)   Wt 202 lb (91.6 kg)   SpO2 100%   BMI 27.40 kg/m    CC: cough Subjective:    Patient ID: John AoWilliam E Asmus, male    DOB: 1965/06/19, [age] y.o.   MRN: [id number]  HPI: John Hoover is a 52 y.o. male presenting on 05/31/2018 for Cough (C/o dry cough, head congestion, pain behind bilateral eyes, facial pain, right side back pain and chest congestion. States he hears "crackling" in his chest. Sxs started about 1 wk ago. Tried Sudafed and OTC cough, barely helpful. )    1 wk h/o non productive but wet sounding cough, rhinorrhea, sneezing, R eye pain headache, PNdrainage. Some back pain when he woke up this morning. Sudden onset of illness.   No fevers/chills, ear or tooth pain, ST, dyspnea or wheezing.  No sick contacts at home Has tried sudafed and OTC cough drops/robitussin.  No smokers at home. No h/o asthma.   2 yrs ago had influenza complicated by respiratory failure needing intubation.      Relevant past medical, surgical, family and social history reviewed and updated as indicated. Interim medical history since our last visit reviewed. Allergies and medications reviewed and updated. No outpatient medications prior to visit.   No facility-administered medications prior to visit.      Per HPI unless specifically indicated in ROS section below Review of Systems Objective:    BP 126/84 (BP Location: Left Arm, Patient Position: Sitting, Cuff Size: Normal)   Pulse 79   Temp 98.6 F (37 C) (Oral)   Ht 6' (1.829 m)   Wt 202 lb (91.6 kg)   SpO2 100%   BMI 27.40 kg/m   Wt Readings from Last 3 Encounters:  05/31/18 202 lb (91.6 kg)  01/27/18 200 lb (90.7 kg)  08/03/17 208 lb (94.3 kg)    Physical Exam Vitals signs and nursing note reviewed.  Constitutional:      General: He is not in acute distress.    Appearance: He is  well-developed.  HENT:     Head: Normocephalic and atraumatic.     Right Ear: Hearing, tympanic membrane, ear canal and external ear normal.     Left Ear: Hearing, tympanic membrane, ear canal and external ear normal.     Nose: Mucosal edema and rhinorrhea present.     Right Sinus: Maxillary sinus tenderness present. No frontal sinus tenderness.     Left Sinus: No maxillary sinus tenderness or frontal sinus tenderness.     Mouth/Throat:     Pharynx: Uvula midline. Posterior oropharyngeal erythema (mild) present. No oropharyngeal exudate.     Tonsils: No tonsillar abscesses.  Eyes:     General: No scleral icterus.    Conjunctiva/sclera: Conjunctivae normal.     Pupils: Pupils are equal, round, and reactive to light.  Neck:     Musculoskeletal: Normal range of motion and neck supple.  Cardiovascular:     Rate and Rhythm: Normal rate and regular rhythm.     Heart sounds: Normal heart sounds. No murmur.  Pulmonary:     Effort: Pulmonary effort is normal. No respiratory distress.     Breath sounds: Normal breath sounds. No wheezing or rales.     Comments: Lungs clear Lymphadenopathy:     Cervical: No cervical adenopathy.  Skin:    General:  Skin is warm and dry.     Findings: No rash.       Lab Results  Component Value Date   CREATININE 1.36 01/26/2018   BUN 18 01/26/2018   NA 138 01/26/2018   K 4.6 01/26/2018   CL 105 01/26/2018   CO2 26 01/26/2018    Assessment & Plan:   Problem List Items Addressed This Visit    Acute sinusitis - Primary    Not consistent with influenza - no fever, no significant body aches. Anticipate viral sinusitis. Supportive care reviewed as per instructions. WASP for augmentin provided with indications when to fill. Pt agrees with plan.       Relevant Medications   amoxicillin-clavulanate (AUGMENTIN) 875-125 MG tablet       Meds ordered this encounter  Medications  . amoxicillin-clavulanate (AUGMENTIN) 875-125 MG tablet    Sig: Take 1 tablet  by mouth 2 (two) times daily for 10 days.    Dispense:  20 tablet    Refill:  0   No orders of the defined types were placed in this encounter.   Follow up plan: Return if symptoms worsen or fail to improve.  Eustaquio BoydenJavier Chailyn Racette, MD

## 2018-05-31 NOTE — Assessment & Plan Note (Signed)
Not consistent with influenza - no fever, no significant body aches. Anticipate viral sinusitis. Supportive care reviewed as per instructions. WASP for augmentin provided with indications when to fill. Pt agrees with plan.

## 2018-05-31 NOTE — Patient Instructions (Addendum)
I think you have respiratory infection possible sinus infection but likely viral.  Treat with ibuprofen 400mg  with meals for next 3 days, take plain mucinex with large glass of water to mobilize mucous, push fluids and rest. If fever >101, worsening productive cough, or prolonged symptoms past 7-10 days, fill antibiotic provided today (augmentin) for 10 days to cover bacterial sinusitis.

## 2018-06-01 ENCOUNTER — Ambulatory Visit: Payer: BLUE CROSS/BLUE SHIELD | Admitting: Family Medicine

## 2018-06-16 ENCOUNTER — Encounter: Payer: Self-pay | Admitting: Family Medicine

## 2018-06-18 ENCOUNTER — Other Ambulatory Visit: Payer: Self-pay | Admitting: Family Medicine

## 2018-06-18 DIAGNOSIS — Z1211 Encounter for screening for malignant neoplasm of colon: Secondary | ICD-10-CM

## 2018-06-25 ENCOUNTER — Other Ambulatory Visit: Payer: Self-pay

## 2018-06-25 DIAGNOSIS — Z1211 Encounter for screening for malignant neoplasm of colon: Secondary | ICD-10-CM

## 2018-07-23 ENCOUNTER — Ambulatory Visit: Payer: BLUE CROSS/BLUE SHIELD | Admitting: Anesthesiology

## 2018-07-23 ENCOUNTER — Encounter: Payer: Self-pay | Admitting: *Deleted

## 2018-07-23 ENCOUNTER — Ambulatory Visit
Admission: RE | Admit: 2018-07-23 | Discharge: 2018-07-23 | Disposition: A | Discharge status: Home / Self Care | Payer: BLUE CROSS/BLUE SHIELD | Attending: Gastroenterology | Admitting: Gastroenterology

## 2018-07-23 ENCOUNTER — Encounter
Admission: RE | Disposition: A | Discharge status: Home / Self Care | Payer: Self-pay | Source: Home / Self Care | Attending: Gastroenterology

## 2018-07-23 DIAGNOSIS — Z1211 Encounter for screening for malignant neoplasm of colon: Secondary | ICD-10-CM | POA: Diagnosis not present

## 2018-07-23 DIAGNOSIS — K5730 Diverticulosis of large intestine without perforation or abscess without bleeding: Secondary | ICD-10-CM | POA: Diagnosis not present

## 2018-07-23 DIAGNOSIS — K5790 Diverticulosis of intestine, part unspecified, without perforation or abscess without bleeding: Secondary | ICD-10-CM | POA: Diagnosis not present

## 2018-07-23 DIAGNOSIS — K6389 Other specified diseases of intestine: Secondary | ICD-10-CM | POA: Diagnosis not present

## 2018-07-23 HISTORY — PX: COLONOSCOPY WITH PROPOFOL: SHX5780

## 2018-07-23 SURGERY — COLONOSCOPY WITH PROPOFOL
Anesthesia: General

## 2018-07-23 MED ORDER — LIDOCAINE HCL (CARDIAC) PF 100 MG/5ML IV SOSY
PREFILLED_SYRINGE | INTRAVENOUS | Status: DC | PRN
  Administered 2018-07-23: 40 mg via INTRAVENOUS

## 2018-07-23 MED ORDER — GLYCOPYRROLATE 0.2 MG/ML IJ SOLN
INTRAMUSCULAR | Status: AC
  Filled 2018-07-23: qty 1

## 2018-07-23 MED ORDER — PROPOFOL 500 MG/50ML IV EMUL
INTRAVENOUS | Status: AC
  Filled 2018-07-23: qty 50

## 2018-07-23 MED ORDER — PROPOFOL 10 MG/ML IV BOLUS
INTRAVENOUS | Status: DC | PRN
  Administered 2018-07-23: 70 mg via INTRAVENOUS

## 2018-07-23 MED ORDER — PROPOFOL 500 MG/50ML IV EMUL
INTRAVENOUS | Status: DC | PRN
  Administered 2018-07-23: 140 ug/kg/min via INTRAVENOUS

## 2018-07-23 MED ORDER — SODIUM CHLORIDE 0.9 % IV SOLN
INTRAVENOUS | Status: DC
  Administered 2018-07-23: 1000 mL via INTRAVENOUS

## 2018-07-23 MED ORDER — PROPOFOL 500 MG/50ML IV EMUL
INTRAVENOUS | Status: AC
  Filled 2018-07-23: qty 100

## 2018-07-23 NOTE — Anesthesia Post-op Follow-up Note (Signed)
Anesthesia QCDR form completed.        

## 2018-07-23 NOTE — Anesthesia Preprocedure Evaluation (Signed)
Anesthesia Evaluation  Patient identified by MRN, date of birth, ID band Patient awake    History of Anesthesia Complications Negative for: history of anesthetic complications  Airway Mallampati: II       Dental   Pulmonary neg sleep apnea, neg COPD,           Cardiovascular (-) hypertension(-) Past MI and (-) CHF (-) dysrhythmias (-) Valvular Problems/Murmurs     Neuro/Psych neg Seizures Anxiety    GI/Hepatic Neg liver ROS, neg GERD  ,  Endo/Other  neg diabetes  Renal/GU Renal disease (damage after the flu, "almost back to normal")     Musculoskeletal   Abdominal   Peds  Hematology  (+) anemia ,   Anesthesia Other Findings   Reproductive/Obstetrics                             Anesthesia Physical Anesthesia Plan  ASA: II  Anesthesia Plan: General   Post-op Pain Management:    Induction: Intravenous  PONV Risk Score and Plan: 2 and Propofol infusion and TIVA  Airway Management Planned: Nasal Cannula  Additional Equipment:   Intra-op Plan:   Post-operative Plan:   Informed Consent: I have reviewed the patients History and Physical, chart, labs and discussed the procedure including the risks, benefits and alternatives for the proposed anesthesia with the patient or authorized representative who has indicated his/her understanding and acceptance.       Plan Discussed with:   Anesthesia Plan Comments:         Anesthesia Quick Evaluation

## 2018-07-23 NOTE — Transfer of Care (Signed)
Immediate Anesthesia Transfer of Care Note  Patient: THURMON GREENEY  Procedure(s) Performed: COLONOSCOPY WITH PROPOFOL (N/A )  Patient Location: PACU  Anesthesia Type:General  Level of Consciousness: sedated  Airway & Oxygen Therapy: Patient Spontanous Breathing and Patient connected to nasal cannula oxygen  Post-op Assessment: Report given to RN and Post -op Vital signs reviewed and stable  Post vital signs: Reviewed and stable  Last Vitals:  Vitals Value Taken Time  BP 94/65 07/23/2018  9:55 AM  Temp 36.2 C 07/23/2018  9:55 AM  Pulse 75 07/23/2018  9:55 AM  Resp 15 07/23/2018  9:55 AM  SpO2 100 % 07/23/2018  9:55 AM  Vitals shown include unvalidated device data.  Last Pain:  Vitals:   07/23/18 0955  TempSrc:   PainSc: 0-No pain         Complications: No apparent anesthesia complications

## 2018-07-23 NOTE — Op Note (Signed)
[facility] Gastroenterology Patient Name: John SimsWilliam Metivier Procedure Date: 07/23/2018 9:20 AM MRN: [id number] Account #: [id number] Date of Birth: 1965-06-14 Admit Type: Outpatient Age: [age] [facility] ROOM 4 Gender: Male Note Status: Finalized Procedure:            Colonoscopy Indications:          Screening for colorectal malignant neoplasm Providers:            Wyline MoodKiran Zair Borawski MD, MD Referring MD:         Dwana CurdGraham S. Para Marchuncan, MD (Referring MD) Medicines:            Monitored Anesthesia Care Complications:        No immediate complications. Procedure:            Pre-Anesthesia Assessment:                       - Prior to the procedure, a History and Physical was                        performed, and patient medications, allergies and                        sensitivities were reviewed. The patient's tolerance of                        previous anesthesia was reviewed.                       - The risks and benefits of the procedure and the                        sedation options and risks were discussed with the                        patient. All questions were answered and informed                        consent was obtained.                       - ASA Grade Assessment: II - A patient with mild                        systemic disease.                       After obtaining informed consent, the colonoscope was                        passed under direct vision. Throughout the procedure,                        the patient's blood pressure, pulse, and oxygen                        saturations were monitored continuously. The                        Colonoscope was introduced through the anus and  advanced to the the cecum, identified by the                        appendiceal orifice, IC valve and transillumination.                        The colonoscopy was performed with ease. The patient                        tolerated the procedure well. The  quality of the bowel                        preparation was good. Findings:      The perianal and digital rectal examinations were normal.      Multiple small-mouthed diverticula were found in the sigmoid colon.      A diffuse area of mild melanosis was found in the transverse colon, in       the ascending colon and in the cecum.      The exam was otherwise without abnormality on direct and retroflexion       views. Impression:           - Diverticulosis in the sigmoid colon.                       - Melanosis in the colon.                       - The examination was otherwise normal on direct and                        retroflexion views.                       - No specimens collected. Recommendation:       - Discharge patient to home (with escort).                       - Resume previous diet.                       - Continue present medications.                       - Repeat colonoscopy in 10 years for screening purposes. Procedure Code(s):    --- Professional ---                       [phone], Colonoscopy, flexible; diagnostic, including                        collection of specimen(s) by brushing or washing, when                        performed (separate procedure) Diagnosis Code(s):    --- Professional ---                       Z12.11, Encounter for screening for malignant neoplasm                        of colon  K63.89, Other specified diseases of intestine                       K57.30, Diverticulosis of large intestine without                        perforation or abscess without bleeding CPT copyright 2018 American Medical Association. All rights reserved. The codes documented in this report are preliminary and upon coder review may  be revised to meet current compliance requirements. Wyline Mood, MD Wyline Mood MD, MD 07/23/2018 9:53:10 AM This report has been signed electronically. Number of Addenda: 0 Note Initiated On: 07/23/2018 9:20 AM Scope  Withdrawal Time: 0 hours 14 minutes 11 seconds  Total Procedure Duration: 0 hours 27 minutes 9 seconds       [facility]

## 2018-07-23 NOTE — H&P (Signed)
Wyline Mood, MD [address], [address], [place], [place], [address] [address], [address], [place], [place], [address] Phone: [phone]  Fax: [phone]  Primary Care Physician:  Joaquim Nam, MD   Pre-Procedure History & Physical: HPI:  John Hoover is a 53 y.o. male is here for an colonoscopy.   Past Medical History:  Diagnosis Date  . AKI (acute kidney injury) (HCC)   . Anemia   . Critical illness polyneuropathy (HCC)   . Flu   . Meniere's disease   . Neuropathy   . Physical deconditioning   . Respiratory failure (HCC)    VDRF flu 2017  . Rhabdomyolysis     Past Surgical History:  Procedure Laterality Date  . COLONOSCOPY WITH PROPOFOL N/A 07/21/2017   Procedure: COLONOSCOPY WITH PROPOFOL;  Surgeon: Wyline Mood, MD;  Location: [facility] ENDOSCOPY;  Service: Gastroenterology;  Laterality: N/A;  . NASAL SINUS SURGERY    . TONSILLECTOMY    . WISDOM TOOTH EXTRACTION      Prior to Admission medications   Not on File    Allergies as of 06/28/2018 - Review Complete 05/31/2018  Allergen Reaction Noted  . Cymbalta [duloxetine hcl]  01/28/2017  . Desloratadine    . Fexofenadine    . Gabapentin  01/28/2017  . Loratadine    . Lyrica [pregabalin]  01/28/2017    Family History  Problem Relation Age of Onset  . Hypertension Mother   . Cancer Mother        breast  . Hypertension Father   . Colon cancer Neg Hx   . Prostate cancer Neg Hx     Social History   Socioeconomic History  . Marital status: Married    Spouse name: Not on file  . Number of children: 2  . Years of education: 23  . Highest education level: Not on file  Occupational History  . Occupation: Works in Product/process development scientist    Comment: [employer]  Social Needs  . Financial resource strain: Not on file  . Food insecurity:    Worry: Not on file    Inability: Not on file  . Transportation needs:    Medical: Not on file    Non-medical: Not on file  Tobacco Use  . Smoking  status: Never Smoker  . Smokeless tobacco: Never Used  Substance and Sexual Activity  . Alcohol use: Yes    Comment: weekends  . Drug use: Never  . Sexual activity: Not on file  Lifestyle  . Physical activity:    Days per week: Not on file    Minutes per session: Not on file  . Stress: Not on file  Relationships  . Social connections:    Talks on phone: Not on file    Gets together: Not on file    Attends religious service: Not on file    Active member of club or organization: Not on file    Attends meetings of clubs or organizations: Not on file    Relationship status: Not on file  . Intimate partner violence:    Fear of current or ex partner: Not on file    Emotionally abused: Not on file    Physically abused: Not on file    Forced sexual activity: Not on file  Other Topics Concern  . Not on file  Social History Narrative   1 daughter (who has DM1) and 1 son   Married 25+ years  Caffeine- sodas    Review of Systems: See HPI, otherwise negative ROS  Physical Exam: BP (!) 124/93   Pulse 78   Temp 98 F (36.7 C) (Tympanic)   Resp 18   Ht 6\' 1"  (1.854 m)   Wt 90.7 kg   SpO2 100%   BMI 26.39 kg/m  General:   Alert,  pleasant and cooperative in NAD Head:  Normocephalic and atraumatic. Neck:  Supple; no masses or thyromegaly. Lungs:  Clear throughout to auscultation, normal respiratory effort.    Heart:  +S1, +S2, Regular rate and rhythm, No edema. Abdomen:  Soft, nontender and nondistended. Normal bowel sounds, without guarding, and without rebound.   Neurologic:  Alert and  oriented x4;  grossly normal neurologically.  Impression/Plan: GRASON DRAWHORN is here for an colonoscopy to be performed for Screening colonoscopy average risk   Risks, benefits, limitations, and alternatives regarding  colonoscopy have been reviewed with the patient.  Questions have been answered.  All parties agreeable.   Wyline Mood, MD  07/23/2018, 9:15 AM

## 2018-07-23 NOTE — Anesthesia Postprocedure Evaluation (Signed)
Anesthesia Post Note  Patient: John Hoover  Procedure(s) Performed: COLONOSCOPY WITH PROPOFOL (N/A )  Patient location during evaluation: Endoscopy Anesthesia Type: General Level of consciousness: awake and alert Pain management: pain level controlled Vital Signs Assessment: post-procedure vital signs reviewed and stable Respiratory status: spontaneous breathing and respiratory function stable Cardiovascular status: stable Anesthetic complications: no     Last Vitals:  Vitals:   07/23/18 0811 07/23/18 0955  BP: (!) 124/93 94/65  Pulse: 78 71  Resp: 18 16  Temp: 36.7 C (!) 36.2 C  SpO2: 100% 99%    Last Pain:  Vitals:   07/23/18 0955  TempSrc:   PainSc: 0-No pain                 [name],[name]

## 2018-07-26 ENCOUNTER — Encounter: Payer: Self-pay | Admitting: Gastroenterology

## 2019-04-14 ENCOUNTER — Other Ambulatory Visit: Payer: Self-pay | Admitting: Family Medicine

## 2019-04-14 DIAGNOSIS — E785 Hyperlipidemia, unspecified: Secondary | ICD-10-CM

## 2019-04-15 ENCOUNTER — Other Ambulatory Visit (INDEPENDENT_AMBULATORY_CARE_PROVIDER_SITE_OTHER): Payer: BC Managed Care – PPO

## 2019-04-15 ENCOUNTER — Other Ambulatory Visit: Payer: Self-pay

## 2019-04-15 DIAGNOSIS — E785 Hyperlipidemia, unspecified: Secondary | ICD-10-CM

## 2019-04-15 LAB — LIPID PANEL
Cholesterol: 195 mg/dL (ref 0–200)
HDL: 61.5 mg/dL (ref >39.00)
LDL Cholesterol: 124 mg/dL — ABNORMAL HIGH (ref 0–99)
NonHDL: 133.99
Total CHOL/HDL Ratio: 3
Triglycerides: 48 mg/dL (ref 0.0–149.0)
VLDL: 9.6 mg/dL (ref 0.0–40.0)

## 2019-04-15 LAB — COMPREHENSIVE METABOLIC PANEL
ALT: 25 U/L (ref 0–53)
AST: 23 U/L (ref 0–37)
Albumin: 4.5 g/dL (ref 3.5–5.2)
Alkaline Phosphatase: 53 U/L (ref 39–117)
BUN: 17 mg/dL (ref 6–23)
CO2: 27 mEq/L (ref 19–32)
Calcium: 9.5 mg/dL (ref 8.4–10.5)
Chloride: 106 mEq/L (ref 96–112)
Creatinine, Ser: 1.29 mg/dL (ref 0.40–1.50)
GFR: 57.71 mL/min — ABNORMAL LOW (ref >60.00)
Glucose, Bld: 104 mg/dL — ABNORMAL HIGH (ref 70–99)
Potassium: 4.4 mEq/L (ref 3.5–5.1)
Sodium: 141 mEq/L (ref 135–145)
Total Bilirubin: 0.5 mg/dL (ref 0.2–1.2)
Total Protein: 6.4 g/dL (ref 6.0–8.3)

## 2019-04-19 ENCOUNTER — Ambulatory Visit (INDEPENDENT_AMBULATORY_CARE_PROVIDER_SITE_OTHER): Payer: BC Managed Care – PPO | Admitting: Family Medicine

## 2019-04-19 ENCOUNTER — Other Ambulatory Visit: Payer: Self-pay

## 2019-04-19 ENCOUNTER — Encounter: Payer: Self-pay | Admitting: Family Medicine

## 2019-04-19 VITALS — BP 110/72 | HR 91 | Temp 97.9°F | Ht 73.0 in | Wt 194.4 lb

## 2019-04-19 DIAGNOSIS — G6281 Critical illness polyneuropathy: Secondary | ICD-10-CM

## 2019-04-19 DIAGNOSIS — Z0000 Encounter for general adult medical examination without abnormal findings: Secondary | ICD-10-CM

## 2019-04-19 DIAGNOSIS — K439 Ventral hernia without obstruction or gangrene: Secondary | ICD-10-CM

## 2019-04-19 DIAGNOSIS — Z7189 Other specified counseling: Secondary | ICD-10-CM

## 2019-04-19 NOTE — Patient Instructions (Addendum)
See about getting a Tdap (tetanus) shot. Let me see about options for your neuropathy.  Update me as needed.  Let me know if you need a referral to the general surgery clinic for a hernia repair, if/when you want to talk to them.  Take care.  Glad to see you.

## 2019-04-19 NOTE — Progress Notes (Signed)
CPE- See plan.  Routine anticipatory guidance given to patient.  See health maintenance.  The possibility exists that previously documented standard health maintenance information may have been brought forward from a previous encounter into this note.  If needed, that same information has been updated to reflect the current situation based on today's encounter.    Tetanus to be done at pharmacy, d/w pt  Flu 2020 PNA not due  Shingles d/w pt.  Can be done at 70.   Colonoscopy 2020.   Prostate cancer screening and PSA options(with potential risks and benefits of testing vs not testing) were discussed along with recent recs/guidelines. He declined testing PSAat this point.  Living will d/w pt. Wife designated if patient were incapacitated.  Diet and exercise d/w pt. Walking more, even with pain from the neuropathy. He is working on diet, d/w pt about diet with his work schedule.  HIV and HCV prev neg, d/w pt.  He has persistent neuropathy sx with altered sensation but not weakness in the BLE.  I told him I would look into options for treatment.  He has failed multiple medications in the past.  He has R sided abd wall hernia/defect but he is willing to tolerate that.  He was hesitant to have surgery at this point, it not absolutely needed.  He can update me as needed and I gave routine cautions.  He wants to monitor for now.  Routine cautions discussed with patient.  PMH and SH reviewed  Meds, vitals, and allergies reviewed.   ROS: Per HPI.  Unless specifically indicated otherwise in HPI, the patient denies:  General: fever. Eyes: acute vision changes ENT: sore throat Cardiovascular: chest pain Respiratory: SOB GI: vomiting GU: dysuria Musculoskeletal: acute back pain Derm: acute rash Neuro: acute motor dysfunction Psych: worsening mood Endocrine: polydipsia Heme: bleeding Allergy: hayfever  GEN: nad, alert and oriented HEENT: mucous membranes moist NECK: supple w/o  LA CV: rrr. PULM: ctab, no inc wob, R sided abd wall hernia noted-soft, not ttp ABD: soft, +bs EXT: no edema SKIN: no acute rash  The 10-year ASCVD risk score Mikey Bussing DC Jr., et al., 2013) is: 3.5%   Values used to calculate the score:     Age: 53 years     Sex: Male     Is Non-Hispanic African American: No     Diabetic: No     Tobacco smoker: No     Systolic Blood Pressure: 397 mmHg     Is BP treated: No     HDL Cholesterol: 61.5 mg/dL     Total Cholesterol: 195 mg/dL

## 2019-04-20 NOTE — Assessment & Plan Note (Signed)
Living will d/w pt.  Wife designated if patient were incapacitated.   ?

## 2019-04-20 NOTE — Assessment & Plan Note (Signed)
He has R sided abd wall hernia/defect but he is willing to tolerate that.  He was hesitant to have surgery at this point, it not absolutely needed.  He can update me as needed and I gave routine cautions.  He wants to monitor for now.  Routine cautions discussed with patient.

## 2019-04-20 NOTE — Assessment & Plan Note (Signed)
He has persistent neuropathy sx with altered sensation but not weakness in the BLE.  I told him I would look into options for treatment.  He has failed multiple medications in the past.

## 2019-04-20 NOTE — Assessment & Plan Note (Signed)
Tetanus to be done at pharmacy, d/w pt  Flu 2020 PNA not due  Shingles d/w pt.  Can be done at 14.   Colonoscopy 2020.   Prostate cancer screening and PSA options(with potential risks and benefits of testing vs not testing) were discussed along with recent recs/guidelines. He declined testing PSAat this point.  Living will d/w pt. Wife designated if patient were incapacitated.  Diet and exercise d/w pt. Walking more, even with pain from the neuropathy. He is working on diet, d/w pt about diet with his work schedule.  HIV and HCV prev neg, d/w pt.

## 2019-05-11 ENCOUNTER — Telehealth: Payer: Self-pay | Admitting: Family Medicine

## 2019-05-11 NOTE — Telephone Encounter (Signed)
Phone call with patient.  I have been checking on topical options for neuropathy symptoms.  I checked multiple sources, including [facility] in [place].  They do have a topical cream that may be of use.  It does contain gabapentin but the patient may be able to tolerate this as a topical medication, instead of taking it orally.  Discussed with patient.  I am hopeful that he would be able to tolerate this cream.  He was interested.  I called the pharmacy and they will fax me paperwork.  I will work on that and we will send it in.  I did asked the patient to update me after he gets the cream.  He agreed.  It is likely reasonable to start with the smaller 30 g tube to see if it is effective/tolerated by the patient.  Patient agrees.  I think all involved.

## 2019-05-13 NOTE — Telephone Encounter (Signed)
Form done. Please scan and fax in.  Thanks.

## 2019-05-13 NOTE — Telephone Encounter (Signed)
Patient advised.

## 2019-05-21 ENCOUNTER — Encounter: Payer: Self-pay | Admitting: Family Medicine

## 2019-05-22 ENCOUNTER — Other Ambulatory Visit: Payer: Self-pay | Admitting: Family Medicine

## 2019-05-22 DIAGNOSIS — N486 Induration penis plastica: Secondary | ICD-10-CM

## 2019-06-06 ENCOUNTER — Encounter: Payer: Self-pay | Admitting: Family Medicine

## 2019-06-09 NOTE — Telephone Encounter (Signed)
Left message for patient to call the office. Need to schedule BP appointment with Dr. Para March.

## 2019-06-09 NOTE — Telephone Encounter (Signed)
[facility] Night - Client Nonclinical Telephone Record AccessNurse Client [facility] Night - Client Client Site [facility] - Night Physician Raechel Ache - MD Contact Type Call Who Is Calling Patient / Member / Family / Caregiver Caller Name Lucan Riner Caller Phone Number [phone] Patient Name John Hoover Patient DOB March 28, 1966 Call Type Message Only Information Provided Reason for Call Request to Schedule Office Appointment Initial Comment Caller states he needs to make an appointment for BP issues. Additional Comment Please call as soon as possible. Disp. Time Disposition Final User 06/09/2019 7:59:13 AM General Information Provided Yes Lucretia Roers, Amy Call Closed By: Angelique Blonder Transaction Date/Time: 06/09/2019 7:57:08 AM (ET)

## 2019-06-10 ENCOUNTER — Ambulatory Visit (INDEPENDENT_AMBULATORY_CARE_PROVIDER_SITE_OTHER): Payer: BC Managed Care – PPO | Admitting: Family Medicine

## 2019-06-10 ENCOUNTER — Other Ambulatory Visit: Payer: Self-pay

## 2019-06-10 ENCOUNTER — Encounter: Payer: Self-pay | Admitting: Family Medicine

## 2019-06-10 VITALS — BP 130/84 | HR 88 | Temp 97.4°F | Wt 204.0 lb

## 2019-06-10 DIAGNOSIS — Z029 Encounter for administrative examinations, unspecified: Secondary | ICD-10-CM

## 2019-06-10 DIAGNOSIS — G6281 Critical illness polyneuropathy: Secondary | ICD-10-CM

## 2019-06-10 NOTE — Patient Instructions (Signed)
Your BP is fine.   BP reading Time 130/84  2:05 PM 126/84  2:20 PM 130/84  2:29 PM  We'll call about seeing neurology.  Take care.  Glad to see you.  No charge for the visit.

## 2019-06-10 NOTE — Progress Notes (Signed)
This visit occurred during the SARS-CoV-2 public health emergency.  Safety protocols were in place, including screening questions prior to the visit, additional usage of staff PPE, and extensive cleaning of exam room while observing appropriate contact time as indicated for disinfecting solutions.  Elevated BP at insurance check, here for repeat eval.   No CP, SOB, BLE edema.   BP reading Time 130/84  2:05 PM 126/84  2:20 PM 130/84  2:29 PM  We talked about his neuropathy situation.  We will refer back to neuro.    Meds, vitals, and allergies reviewed.   ROS: Per HPI unless specifically indicated in ROS section   nad ncat rrr ctab

## 2019-06-12 DIAGNOSIS — Z029 Encounter for administrative examinations, unspecified: Secondary | ICD-10-CM | POA: Insufficient documentation

## 2019-06-12 NOTE — Assessment & Plan Note (Signed)
Refer back to neurology. 

## 2019-06-12 NOTE — Assessment & Plan Note (Signed)
Normal blood pressure documented x3.  No charge for visit.

## 2019-06-27 DIAGNOSIS — N486 Induration penis plastica: Secondary | ICD-10-CM | POA: Diagnosis not present

## 2019-06-27 DIAGNOSIS — N5201 Erectile dysfunction due to arterial insufficiency: Secondary | ICD-10-CM | POA: Diagnosis not present

## 2019-06-27 DIAGNOSIS — Z125 Encounter for screening for malignant neoplasm of prostate: Secondary | ICD-10-CM | POA: Diagnosis not present

## 2019-06-28 ENCOUNTER — Telehealth: Payer: Self-pay | Admitting: *Deleted

## 2019-06-28 NOTE — Telephone Encounter (Signed)
Called patient and informed him that his appointment Feb 17th needs to be rescheduled. We rescheduled for this week, and patient verbalized understanding, appreciation.

## 2019-06-30 ENCOUNTER — Other Ambulatory Visit: Payer: Self-pay

## 2019-06-30 ENCOUNTER — Ambulatory Visit: Payer: BC Managed Care – PPO | Admitting: Diagnostic Neuroimaging

## 2019-06-30 ENCOUNTER — Encounter: Payer: Self-pay | Admitting: Diagnostic Neuroimaging

## 2019-06-30 VITALS — BP 124/80 | HR 78 | Temp 97.7°F | Ht 73.0 in | Wt 207.8 lb

## 2019-06-30 DIAGNOSIS — G6281 Critical illness polyneuropathy: Secondary | ICD-10-CM | POA: Diagnosis not present

## 2019-06-30 NOTE — Progress Notes (Signed)
[facility]  PATIENT: John Hoover DOB: May 27, 1966  REFERRING CLINICIAN: Elsie Stain HISTORY FROM: patient REASON FOR VISIT: follow up   HISTORICAL  CHIEF COMPLAINT:  Chief Complaint  Patient presents with  . Critcal illness neuropathy    rm 6 "Lyrica and Cymbalta made me feel loopy and high, Neuropathy cream didn't help; couldn't afford pain management;  want to know other options"    HISTORY OF PRESENT ILLNESS:  UPDATE (06/30/19, VRP): Since last visit, now off all meds. However, pain is increasing in feet. No alleviating or aggravating factors.  UPDATE 08/05/16: Since last visit, neuropathy pain continues. Lyrica helps a little. However has noted gradual weight gain. Has tried neuropathy cream but not much benefit.  PRIOR HPI (05/06/16): 54 year old male here for evaluation of neuropathy. In April 2017 patient had influenza, complicated by acute renal failure, rhabdomyolysis, pneumonia and sepsis. Patient had severe critical illness and ICU stay. In addition he had critical ischemic changes to his peripheral limbs, fingers and toes. Fortunately he was able to survive this severe illness with prolonged hospital course and rehabilitation. However during his recovery he noticed severe numbness, tingling, weakness in his extremities. Some of these symptoms improved over time. He continues to have numbness and tingling in his toes and feet as well as slightly in his fingers and hands. Symptoms have stabilized over the past few months.   REVIEW OF SYSTEMS: Full 14 system review of systems performed and negative with exception of: as per HPI.   ALLERGIES: Allergies  Allergen Reactions  . Cymbalta [Duloxetine Hcl]     Ineffective for pain.   Marland Kitchen Desloratadine     Ineffective   . Fexofenadine     Ineffective   . Gabapentin     intolerant  . Loratadine     Ineffective   . Lyrica [Pregabalin]     Intolerant, sedation    HOME MEDICATIONS: No outpatient  medications prior to visit.   No facility-administered medications prior to visit.    PAST MEDICAL HISTORY: Past Medical History:  Diagnosis Date  . AKI (acute kidney injury) ([place])   . Anemia   . Critical illness polyneuropathy ([place])   . Flu   . Meniere's disease   . Neuropathy   . Physical deconditioning   . Respiratory failure ([place])    VDRF flu 2017  . Rhabdomyolysis     PAST SURGICAL HISTORY: Past Surgical History:  Procedure Laterality Date  . COLONOSCOPY WITH PROPOFOL N/A 07/21/2017   Procedure: COLONOSCOPY WITH PROPOFOL;  Surgeon: Jonathon Bellows, MD;  Location: [facility] ENDOSCOPY;  Service: Gastroenterology;  Laterality: N/A;  . COLONOSCOPY WITH PROPOFOL N/A 07/23/2018   Procedure: COLONOSCOPY WITH PROPOFOL;  Surgeon: Jonathon Bellows, MD;  Location: [facility] ENDOSCOPY;  Service: Gastroenterology;  Laterality: N/A;  . NASAL SINUS SURGERY    . TONSILLECTOMY    . WISDOM TOOTH EXTRACTION      FAMILY HISTORY: Family History  Problem Relation Age of Onset  . Hypertension Mother   . Cancer Mother        breast  . Hypertension Father   . Colon cancer Neg Hx   . Prostate cancer Neg Hx     SOCIAL HISTORY:  Social History   Socioeconomic History  . Marital status: Married    Spouse name: Anne Ng  . Number of children: 2  . Years of education: 5  . Highest education level: Not on file  Occupational History  . Occupation: Works in Energy manager  Comment: [employer]  Tobacco Use  . Smoking status: Never Smoker  . Smokeless tobacco: Never Used  Substance and Sexual Activity  . Alcohol use: Yes    Comment: weekends  . Drug use: Never  . Sexual activity: Not on file  Other Topics Concern  . Not on file  Social History Narrative   1 daughter (who has DM1) and 1 son   Married 25+ years   Caffeine- sodas   Social Determinants of Health   Financial Resource Strain:   . Difficulty of Paying Living Expenses: Not on file  Food Insecurity:   . Worried About Brewing technologist in the Last Year: Not on file  . Ran Out of Food in the Last Year: Not on file  Transportation Needs:   . Lack of Transportation (Medical): Not on file  . Lack of Transportation (Non-Medical): Not on file  Physical Activity:   . Days of Exercise per Week: Not on file  . Minutes of Exercise per Session: Not on file  Stress:   . Feeling of Stress : Not on file  Social Connections:   . Frequency of Communication with Friends and Family: Not on file  . Frequency of Social Gatherings with Friends and Family: Not on file  . Attends Religious Services: Not on file  . Active Member of Clubs or Organizations: Not on file  . Attends Banker Meetings: Not on file  . Marital Status: Not on file  Intimate Partner Violence:   . Fear of Current or Ex-Partner: Not on file  . Emotionally Abused: Not on file  . Physically Abused: Not on file  . Sexually Abused: Not on file     PHYSICAL EXAM  GENERAL EXAM/CONSTITUTIONAL: Vitals:  Vitals:   06/30/19 1338  BP: 124/80  Pulse: 78  Temp: 97.7 F (36.5 C)  Weight: 207 lb 12.8 oz (94.3 kg)  Height: 6\' 1"  (1.854 m)   Body mass index is 27.42 kg/m. No exam data present  Wt Readings from Last 3 Encounters:  06/30/19 207 lb 12.8 oz (94.3 kg)  06/10/19 204 lb (92.5 kg)  04/19/19 194 lb 7 oz (88.2 kg)    Patient is in no distress; well developed, nourished and groomed; neck is supple  CARDIOVASCULAR:  Examination of carotid arteries is normal; no carotid bruits  Regular rate and rhythm, no murmurs  Examination of peripheral vascular system by observation and palpation is normal  EYES:  Ophthalmoscopic exam of optic discs and posterior segments is normal; no papilledema or hemorrhages  MUSCULOSKELETAL:  Gait, strength, tone, movements noted in Neurologic exam below  NEUROLOGIC: MENTAL STATUS:  No flowsheet data found.  awake, alert, oriented to person, place and time  recent and remote memory intact  normal  attention and concentration  language fluent, comprehension intact, naming intact,   fund of knowledge appropriate  CRANIAL NERVE:   2nd - no papilledema on fundoscopic exam  2nd, 3rd, 4th, 6th - pupils equal and reactive to light, visual fields full to confrontation, extraocular muscles intact, no nystagmus  5th - facial sensation symmetric  7th - facial strength symmetric  8th - hearing intact  9th - palate elevates symmetrically, uvula midline  11th - shoulder shrug symmetric  12th - tongue protrusion midline  MOTOR:   normal bulk and tone, full strength in the BUE, BLE  SENSORY:   normal and symmetric to light touch, temperature, vibration  DECR VIB AT TOES  COORDINATION:   finger-nose-finger, fine  finger movements normal  REFLEXES:   deep tendon reflexes 2 and symmetric  GAIT/STATION:   narrow based gait    DIAGNOSTIC DATA (LABS, IMAGING, TESTING) - I reviewed patient records, labs, notes, testing and imaging myself where available.  Lab Results  Component Value Date   WBC 7.1 01/25/2016   HGB 13.0 01/25/2016   HCT 38.3 (L) 01/25/2016   MCV 91.0 01/25/2016   PLT 198.0 01/25/2016      Component Value Date/Time   NA 141 04/15/2019 0857   NA 139 11/06/2010 0000   K 4.4 04/15/2019 0857   K 3.7 11/06/2010 0000   CL 106 04/15/2019 0857   CL 102 11/06/2010 0000   CO2 27 04/15/2019 0857   CO2 22 11/06/2010 0000   GLUCOSE 104 (H) 04/15/2019 0857   BUN 17 04/15/2019 0857   BUN 10 11/06/2010 0000   CREATININE 1.29 04/15/2019 0857   CREATININE 0.99 11/06/2010 0000   CALCIUM 9.5 04/15/2019 0857   CALCIUM 8.6 11/06/2010 0000   PROT 6.4 04/15/2019 0857   ALBUMIN 4.5 04/15/2019 0857   AST 23 04/15/2019 0857   ALT 25 04/15/2019 0857   ALKPHOS 53 04/15/2019 0857   BILITOT 0.5 04/15/2019 0857   GFRNONAA 47 (L) 10/05/2015 0806   GFRAA 54 (L) 10/05/2015 0806   Lab Results  Component Value Date   CHOL 195 04/15/2019   HDL 61.50 04/15/2019    LDLCALC 124 (H) 04/15/2019   TRIG 48.0 04/15/2019   CHOLHDL 3 04/15/2019   Lab Results  Component Value Date   HGBA1C 5.9 (H) 09/15/2015   Lab Results  Component Value Date   VITAMINB12 998 (H) 09/25/2015   No results found for: TSH   08/28/15 CT head [I reviewed images myself and agree with interpretation. -VRP]  - No acute intracranial findings or mass lesion. 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     ASSESSMENT AND PLAN  54 y.o. year old male here with:  Dx: critical illness neuropathy (post-flu, ARF, sepsis, limb ischemia, ICU)  1. Critical illness polyneuropathy (HCC)    Tried and failed: gabapentin, lyrica, cymbalta and neuropathy cream   PLAN:  CRITICAL ILLNESS NEUROPATHY - may consider trying lyrica, gabapentin, cymbalta again (this time lower dose; possibly once in the evening to reduce side effects) - consider follow up with pain mgmt clinic ([facility] academic [facility])  Return for pending if symptoms worsen or fail to improve.    Suanne Marker, MD 06/30/2019, 2:20 PM Certified in Neurology, Neurophysiology and Neuroimaging  [facility] [address], [address] [place], [place] [address] [phone]

## 2019-07-20 ENCOUNTER — Institutional Professional Consult (permissible substitution): Payer: BC Managed Care – PPO | Admitting: Diagnostic Neuroimaging

## 2019-08-07 ENCOUNTER — Ambulatory Visit: Payer: BC Managed Care – PPO | Attending: Internal Medicine

## 2019-08-07 DIAGNOSIS — Z23 Encounter for immunization: Secondary | ICD-10-CM | POA: Insufficient documentation

## 2019-08-07 NOTE — Progress Notes (Signed)
   Covid-19 Vaccination Clinic  Name:  John Hoover    MRN: [id number] DOB: April 08, 1966  08/07/2019  Mr. John Hoover was observed post Covid-19 immunization for 15 minutes without incident. He was provided with Vaccine Information Sheet and instruction to access the V-Safe system.   Mr. John Hoover was instructed to call 911 with any severe reactions post vaccine: Marland Kitchen Difficulty breathing  . Swelling of face and throat  . A fast heartbeat  . A bad rash all over body  . Dizziness and weakness   Immunizations Administered    Name Date Dose VIS Date Route   Pfizer COVID-19 Vaccine 08/07/2019  4:52 PM 0.3 mL 05/13/2019 Intramuscular   Manufacturer: [employer], [employer]   Lot: [id number]   NDC: [address]-8

## 2019-08-30 ENCOUNTER — Ambulatory Visit: Payer: BC Managed Care – PPO | Attending: Internal Medicine

## 2019-08-30 DIAGNOSIS — Z23 Encounter for immunization: Secondary | ICD-10-CM

## 2019-08-30 NOTE — Progress Notes (Signed)
   Covid-19 Vaccination Clinic  Name:  John Hoover    MRN: [id number] DOB: Oct 26, 1965  08/30/2019  [name] was observed post Covid-19 immunization for 15 minutes without incident. He was provided with Vaccine Information Sheet and instruction to access the V-Safe system.   [name] was instructed to call 911 with any severe reactions post vaccine: Marland Kitchen Difficulty breathing  . Swelling of face and throat  . A fast heartbeat  . A bad rash all over body  . Dizziness and weakness   Immunizations Administered    Name Date Dose VIS Date Route   Pfizer COVID-19 Vaccine 08/30/2019  3:03 PM 0.3 mL 05/13/2019 Intramuscular   Manufacturer: [employer], [employer]   Lot: [phone] [id number]   NDC: [address]-6

## 2019-09-23 ENCOUNTER — Ambulatory Visit: Payer: BC Managed Care – PPO | Admitting: Family Medicine

## 2019-09-23 ENCOUNTER — Other Ambulatory Visit: Payer: Self-pay

## 2019-09-23 ENCOUNTER — Encounter: Payer: Self-pay | Admitting: Family Medicine

## 2019-09-23 DIAGNOSIS — H9312 Tinnitus, left ear: Secondary | ICD-10-CM | POA: Diagnosis not present

## 2019-09-23 DIAGNOSIS — H9319 Tinnitus, unspecified ear: Secondary | ICD-10-CM | POA: Insufficient documentation

## 2019-09-23 MED ORDER — HYDROCHLOROTHIAZIDE 12.5 MG PO CAPS: 12.5000 mg | ORAL_CAPSULE | Freq: Every day | ORAL | 3 refills | Status: DC

## 2019-09-23 NOTE — Assessment & Plan Note (Signed)
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 No signs of sinusitis, AOM or other cause. No recent viral illness to suggest vestibular neuritis. Discussed concern for recurrent meniere's, ddx includes labrynthitis. Suggested start low dose thiazide diuretic, work on low sodium diet, and monitor effect. Offered valium PRN vertigo - he declines as hasn't had vertigo attack at this time. Discussed referral to ENT if not improving with above. He will update [place] over the next few weeks.

## 2019-09-23 NOTE — Progress Notes (Signed)
This visit was conducted in person.  BP 122/84 (BP Location: Left Arm, Patient Position: Sitting, Cuff Size: Large)   Pulse 78   Temp 97.9 F (36.6 C) (Temporal)   Ht 6\' 1"  (1.854 m)   Wt 203 lb 7 oz (92.3 kg)   SpO2 99%   BMI 26.84 kg/m    CC: L ear ringing Subjective:    Patient ID: John Hoover, male    DOB: November 25, 1965, 54 y.o.   MRN: [id number]  HPI: John Hoover is a 54 y.o. male presenting on 09/23/2019 for Tinnitus (C/o ringing in left ear.  Started about 2 wks ago.  Denies any pain.  Thought the ringing may be due to recent sinus infections. )   2 wk h/o L tinnitus described as roar of ocean waves like when you listen to a sea shell. Some subjective hearing loss with this episode due to loud sound. Sound is not ringing or pulsatile in nature. No ear pain, drainage, viral URI sxs. No recent swimming. Some allergic rhinitis symptoms  H/o meniere's disease 15 yrs ago - tinnitus, vertigo treated with valium. Previously saw Dr 09/25/2019 [facility]) and [facility] Previously on thiazide and riboflavin.   He did recently clean ears out with bulb - used warm water to clean ear. No increased salt recently. Not on any aspirin, NSAID.       Relevant past medical, surgical, family and social history reviewed and updated as indicated. Interim medical history since our last visit reviewed. Allergies and medications reviewed and updated. No outpatient medications prior to visit.   No facility-administered medications prior to visit.     Per HPI unless specifically indicated in ROS section below Review of Systems Objective:    BP 122/84 (BP Location: Left Arm, Patient Position: Sitting, Cuff Size: Large)   Pulse 78   Temp 97.9 F (36.6 C) (Temporal)   Ht 6\' 1"  (1.854 m)   Wt 203 lb 7 oz (92.3 kg)   SpO2 99%   BMI 26.84 kg/m   Wt Readings from Last 3 Encounters:  09/23/19 203 lb 7 oz (92.3 kg)  06/30/19 207 lb 12.8 oz (94.3 kg)  06/10/19 204 lb (92.5 kg)    Physical  Exam Vitals and nursing note reviewed.  Constitutional:      Appearance: Normal appearance. He is not ill-appearing.  HENT:     Head: Normocephalic and atraumatic.     Right Ear: Tympanic membrane, ear canal and external ear normal. There is no impacted cerumen.     Left Ear: Tympanic membrane, ear canal and external ear normal. There is no impacted cerumen.     Ears:     Comments: L external canal irritation    Nose: Nose normal. No congestion or rhinorrhea.     Mouth/Throat:     Mouth: Mucous membranes are moist.     Pharynx: Oropharynx is clear. No oropharyngeal exudate or posterior oropharyngeal erythema.  Eyes:     Extraocular Movements: Extraocular movements intact.     Pupils: Pupils are equal, round, and reactive to light.     Comments: Faint cataracts  Neck:     Vascular: No carotid bruit.  Cardiovascular:     Rate and Rhythm: Normal rate and regular rhythm.     Pulses: Normal pulses.     Heart sounds: Normal heart sounds. No murmur.  Musculoskeletal:     Cervical back: Normal range of motion and neck supple. No rigidity.  Lymphadenopathy:  Cervical: No cervical adenopathy.  Skin:    General: Skin is warm and dry.     Findings: No rash.  Neurological:     Mental Status: He is alert.  Psychiatric:        Mood and Affect: Mood normal.        Behavior: Behavior normal.       Results for orders placed or performed in visit on 04/15/19  future Lipid panel  Result Value Ref Range   Cholesterol 195 0 - 200 mg/dL   Triglycerides 48.0 0.0 - 149.0 mg/dL   HDL 61.50 >39.00 mg/dL   VLDL 9.6 0.0 - 40.0 mg/dL   LDL Cholesterol 124 (H) 0 - 99 mg/dL   Total CHOL/HDL Ratio 3    NonHDL 133.99   future Comprehensive metabolic panel  Result Value Ref Range   Sodium 141 135 - 145 mEq/L   Potassium 4.4 3.5 - 5.1 mEq/L   Chloride 106 96 - 112 mEq/L   CO2 27 19 - 32 mEq/L   Glucose, Bld 104 (H) 70 - 99 mg/dL   BUN 17 6 - 23 mg/dL   Creatinine, Ser 1.29 0.40 - 1.50 mg/dL     Total Bilirubin 0.5 0.2 - 1.2 mg/dL   Alkaline Phosphatase 53 39 - 117 U/L   AST 23 0 - 37 U/L   ALT 25 0 - 53 U/L   Total Protein 6.4 6.0 - 8.3 g/dL   Albumin 4.5 3.5 - 5.2 g/dL   GFR 57.71 (L) >60.00 mL/min   Calcium 9.5 8.4 - 10.5 mg/dL   Assessment & Plan:  This visit occurred during the SARS-CoV-2 public health emergency.  Safety protocols were in place, including screening questions prior to the visit, additional usage of staff PPE, and extensive cleaning of exam room while observing appropriate contact time as indicated for disinfecting solutions.   Problem List Items Addressed This Visit    Tinnitus    Associated with hearing loss due to loud tinnitus. No signs of sinusitis, AOM or other cause. No recent viral illness to suggest vestibular neuritis. Discussed concern for recurrent meniere's, ddx includes labrynthitis. Suggested start low dose thiazide diuretic, work on low sodium diet, and monitor effect. Offered valium PRN vertigo - he declines as hasn't had vertigo attack at this time. Discussed referral to ENT if not improving with above. He will update [place] over the next few weeks.           Meds ordered this encounter  Medications  . hydrochlorothiazide (MICROZIDE) 12.5 MG capsule    Sig: Take 1 capsule (12.5 mg total) by mouth daily.    Dispense:  30 capsule    Refill:  3   No orders of the defined types were placed in this encounter.   Follow up plan: Return if symptoms worsen or fail to improve.  Ria Bush, MD

## 2019-09-23 NOTE — Patient Instructions (Addendum)
This may be recurring meniere's. Start hydrochlorothiazide 12.5mg  daily (water pill). Ensure good potassium rich foods (fruits/vegetables). Work on low sodium diet.  Let us know if vertigo develops - we could prescribe valium.  If not improving with water pill, recommend return to ENT.  Let us know how you're doing.   Meniere Disease  Meniere disease is an inner ear disorder. It causes attacks of a spinning sensation (vertigo), dizziness, and ringing in the ear (tinnitus). It also causes hearing loss and a feeling of fullness or pressure in the ear. This is a lifelong condition, and it may get worse over time. You may have drop attacks or severe dizziness that makes you fall. A drop attack is when you suddenly fall without losing consciousness and you quickly recover after a few seconds or minutes. What are the causes? This condition is caused by having too much of the fluid that is in your inner ear (endolymph). When fluid builds up in your inner ear, it affects the nerves that control balance and hearing. The reason for the fluid buildup is not known. Possible causes include:  Allergies.  An abnormal reaction of the body's defense system (autoimmune disease).  Viral infection of the inner ear.  Head injury. What increases the risk? You are more likely to develop this condition if:  You are older than age 10.  You have a family history of Meniere disease.  You have a history of autoimmune disease.  You have a history of migraine headaches. What are the signs or symptoms? Symptoms of this condition can come and go and may last for up to 4 hours at a time. Symptoms usually start in one ear. They may become more frequent and eventually involve both ears. Symptoms can include:  Fullness and pressure in your ear.  Roaring or ringing in your ear.  Vertigo and loss of balance.  Dizziness.  Decreased hearing.  Nausea and vomiting. How is this diagnosed? This condition is  diagnosed based on:  A physical exam.  Tests , such as: ? A hearing test (audiogram). ? An electronystagmogram. This tests your balance nerve (vestibular nerve). ? Imaging studies of your inner ear, such as CT scan or MRI. ? Other balance tests, such as rotational or balance platform tests. How is this treated? There is no cure for this condition, but treatment can help to manage your symptoms. Treatment may include:  A low-salt diet. Limiting salt may help to reduce fluid in the body and relieve symptoms.  Oral or injected medicines to reduce or control: ? Vertigo. ? Nausea. ? Fluid retention. ? Dizziness.  Use of an air pressure pulse generator. This is a machine that sends small pressure pulses into your ear canal.  Hearing aids.  Inner ear surgery. This is rare. When you have symptoms, it can be helpful to lie down on a flat surface and focus your eyes on one object that does not move. Try to stay in that position until your symptoms go away. Follow these instructions at home: Eating and drinking  Eat the same amount of food at the same time every day, including snacks.  Do not skip meals.  Avoid caffeine.  Drink enough fluids to keep your urine clear or pale yellow.  Limit alcoholic drinks to one drink a day for non-pregnant women and 2 drinks a day for men. One drink equals 12 oz of beer, 5 oz of wine, or 1 oz of hard liquor.  Limit the salt (sodium) in  your diet as told by your health care provider. Check ingredients and nutrition facts on packaged foods and beverages.  Do not eat foods that contain monosodium glutamate (MSG). General instructions  Do not use any products that contain nicotine or tobacco, such as cigarettes and e-cigarettes. If you need help quitting, ask your health care provider.  Take over-the-counter and prescription medicines only as told by your health care provider.  Find ways to reduce or avoid stress. If you need help with this, ask  your health care provider.  Do not drive if you have vertigo or dizziness. Contact a health care provider if:  You have symptoms that last longer than 4 hours.  You have new or worse symptoms. Get help right away if:  You have been vomiting for 24 hours.  You cannot keep fluids down.  You have chest pain or trouble breathing. Summary  Meniere disease is an inner ear disorder. It causes attacks of a spinning sensation (vertigo), dizziness, and ringing in the ear (tinnitus). It also causes hearing loss and a feeling of fullness or pressure in the ear.  Symptoms of this condition can come and go and may last for up to 4 hours at a time.  When you have symptoms, it can be helpful to lie down on a flat surface and focus your eyes on one object that does not move. Try to stay in that position until your symptoms go away. This information is not intended to replace advice given to you by your health care provider. Make sure you discuss any questions you have with your health care provider. Document Revised: 05/01/2017 Document Reviewed: 04/09/2016 Elsevier Patient Education  2020 [employer].

## 2019-09-26 DIAGNOSIS — N486 Induration penis plastica: Secondary | ICD-10-CM | POA: Diagnosis not present

## 2019-12-27 DIAGNOSIS — N486 Induration penis plastica: Secondary | ICD-10-CM | POA: Diagnosis not present

## 2019-12-28 DIAGNOSIS — N486 Induration penis plastica: Secondary | ICD-10-CM | POA: Diagnosis not present

## 2020-02-08 DIAGNOSIS — N486 Induration penis plastica: Secondary | ICD-10-CM | POA: Diagnosis not present

## 2020-02-09 DIAGNOSIS — N486 Induration penis plastica: Secondary | ICD-10-CM | POA: Diagnosis not present

## 2020-03-21 DIAGNOSIS — N486 Induration penis plastica: Secondary | ICD-10-CM | POA: Diagnosis not present

## 2020-03-23 DIAGNOSIS — N486 Induration penis plastica: Secondary | ICD-10-CM | POA: Diagnosis not present

## 2020-04-02 ENCOUNTER — Encounter: Payer: Self-pay | Admitting: Family Medicine

## 2020-04-22 ENCOUNTER — Other Ambulatory Visit: Payer: Self-pay | Admitting: Family Medicine

## 2020-04-22 DIAGNOSIS — E785 Hyperlipidemia, unspecified: Secondary | ICD-10-CM

## 2020-04-24 ENCOUNTER — Other Ambulatory Visit: Payer: Self-pay

## 2020-04-24 ENCOUNTER — Other Ambulatory Visit (INDEPENDENT_AMBULATORY_CARE_PROVIDER_SITE_OTHER): Payer: BC Managed Care – PPO

## 2020-04-24 DIAGNOSIS — E785 Hyperlipidemia, unspecified: Secondary | ICD-10-CM | POA: Diagnosis not present

## 2020-04-24 LAB — COMPREHENSIVE METABOLIC PANEL
ALT: 15 U/L (ref 0–53)
AST: 15 U/L (ref 0–37)
Albumin: 4.2 g/dL (ref 3.5–5.2)
Alkaline Phosphatase: 51 U/L (ref 39–117)
BUN: 18 mg/dL (ref 6–23)
CO2: 26 mEq/L (ref 19–32)
Calcium: 9.3 mg/dL (ref 8.4–10.5)
Chloride: 106 mEq/L (ref 96–112)
Creatinine, Ser: 1.34 mg/dL (ref 0.40–1.50)
GFR: 59.19 mL/min — ABNORMAL LOW (ref >60.00)
Glucose, Bld: 94 mg/dL (ref 70–99)
Potassium: 4.1 mEq/L (ref 3.5–5.1)
Sodium: 140 mEq/L (ref 135–145)
Total Bilirubin: 0.7 mg/dL (ref 0.2–1.2)
Total Protein: 6.7 g/dL (ref 6.0–8.3)

## 2020-04-24 LAB — LIPID PANEL
Cholesterol: 175 mg/dL (ref 0–200)
HDL: 62 mg/dL (ref >39.00)
LDL Cholesterol: 102 mg/dL — ABNORMAL HIGH (ref 0–99)
NonHDL: 112.64
Total CHOL/HDL Ratio: 3
Triglycerides: 55 mg/dL (ref 0.0–149.0)
VLDL: 11 mg/dL (ref 0.0–40.0)

## 2020-05-01 DIAGNOSIS — N486 Induration penis plastica: Secondary | ICD-10-CM | POA: Diagnosis not present

## 2020-05-02 DIAGNOSIS — N486 Induration penis plastica: Secondary | ICD-10-CM | POA: Diagnosis not present

## 2020-05-03 ENCOUNTER — Encounter: Payer: Self-pay | Admitting: Family Medicine

## 2020-05-03 ENCOUNTER — Ambulatory Visit (INDEPENDENT_AMBULATORY_CARE_PROVIDER_SITE_OTHER): Payer: BC Managed Care – PPO | Admitting: Family Medicine

## 2020-05-03 ENCOUNTER — Other Ambulatory Visit: Payer: Self-pay

## 2020-05-03 VITALS — BP 126/80 | HR 82 | Temp 98.4°F | Ht 73.0 in | Wt 204.9 lb

## 2020-05-03 DIAGNOSIS — K439 Ventral hernia without obstruction or gangrene: Secondary | ICD-10-CM

## 2020-05-03 DIAGNOSIS — H9312 Tinnitus, left ear: Secondary | ICD-10-CM

## 2020-05-03 DIAGNOSIS — Z23 Encounter for immunization: Secondary | ICD-10-CM | POA: Diagnosis not present

## 2020-05-03 DIAGNOSIS — Z7189 Other specified counseling: Secondary | ICD-10-CM

## 2020-05-03 DIAGNOSIS — Z0000 Encounter for general adult medical examination without abnormal findings: Secondary | ICD-10-CM

## 2020-05-03 DIAGNOSIS — M792 Neuralgia and neuritis, unspecified: Secondary | ICD-10-CM

## 2020-05-03 NOTE — Patient Instructions (Addendum)
Thanks for your effort.  Flu shot today.  Booster shot when possible.   Let me know if you need a referral to neurology.  Take care.  Glad to see you.

## 2020-05-03 NOTE — Progress Notes (Signed)
This visit occurred during the SARS-CoV-2 public health emergency.  Safety protocols were in place, including screening questions prior to the visit, additional usage of staff PPE, and extensive cleaning of exam room while observing appropriate contact time as indicated for disinfecting solutions.  CPE- See plan.  Routine anticipatory guidance given to patient.  See health maintenance.  The possibility exists that previously documented standard health maintenance information may have been brought forward from a previous encounter into this note.  If needed, that same information has been updated to reflect the current situation based on today's encounter.    Tetanus to be done at pharmacy, d/w pt  Flu today PNA not due  Shingles d/w pt.  Colonoscopy 2020.  Prostate cancer screening and PSA options(with potential risks and benefits of testing vs not testing) were discussed along with recent recs/guidelines. He declined testing PSAat this point.  Living will d/w pt. Wife designated if patient were incapacitated.  Diet and exercise d/w pt. Walking , even with pain from the neuropathy. He is working on diet. HIV and HCV prev neg, d/w pt.  Ear ringing noted.  Still off HCTZ- it didn't help.  No vertigo.  He had ENT f/u prev.    We talked about neuropathy sx.  He is considering if he wants tertiary care referral.  He still has some sensation but it is clearly altered from his hands.  Lyrica helped the pain but slowed his mentation.  He is putting up with residual sx at this point.  Foot care d/w pt.   He still has his abd wall hernia.  Noted by patient but not severe.  Routine cautions d/w pt.    PMH and SH reviewed  Meds, vitals, and allergies reviewed.   ROS: Per HPI.  Unless specifically indicated otherwise in HPI, the patient denies:  General: fever. Eyes: acute vision changes ENT: sore throat Cardiovascular: chest pain Respiratory: SOB GI: vomiting GU:  dysuria Musculoskeletal: acute back pain Derm: acute rash Neuro: acute motor dysfunction Psych: worsening mood Endocrine: polydipsia Heme: bleeding Allergy: hayfever  GEN: nad, alert and oriented HEENT: ncat NECK: supple w/o LA CV: rrr. PULM: ctab, no inc wob ABD: soft, +bs, soft hernia right of midline.  Easily reduced. EXT: no edema SKIN: no acute rash

## 2020-05-06 NOTE — Assessment & Plan Note (Signed)
  Ear ringing noted.  Still off HCTZ- it didn't help.  No vertigo.  He had ENT f/u prev.   We talked about ENT follow-up again if he has persistent symptoms.  He can consider.

## 2020-05-06 NOTE — Assessment & Plan Note (Signed)
Living will d/w pt.  Wife designated if patient were incapacitated.   ?

## 2020-05-06 NOTE — Assessment & Plan Note (Signed)
  Tetanus to be done at pharmacy, d/w pt  Flu today PNA not due  Shingles d/w pt.  Colonoscopy 2020.  Prostate cancer screening and PSA options(with potential risks and benefits of testing vs not testing) were discussed along with recent recs/guidelines. He declined testing PSAat this point.  Living will d/w pt. Wife designated if patient were incapacitated.  Diet and exercise d/w pt. Walking , even with pain from the neuropathy. He is working on diet. HIV and HCV prev neg, d/w pt.

## 2020-05-06 NOTE — Assessment & Plan Note (Signed)
We talked about neuropathy sx.  He is considering if he wants tertiary care referral.  He still has some sensation but it is clearly altered from his hands.  Lyrica helped the pain but slowed his mentation.  He is putting up with residual sx at this point.  Foot care d/w pt. he will update me as needed.

## 2020-05-06 NOTE — Assessment & Plan Note (Signed)
Not symptomatic enough to need repair at this point.  He will update me as needed.  Routine cautions given to patient.  He agrees with plan.

## 2020-06-19 DIAGNOSIS — N486 Induration penis plastica: Secondary | ICD-10-CM | POA: Diagnosis not present

## 2020-06-19 DIAGNOSIS — N5201 Erectile dysfunction due to arterial insufficiency: Secondary | ICD-10-CM | POA: Diagnosis not present

## 2020-08-02 DIAGNOSIS — H9042 Sensorineural hearing loss, unilateral, left ear, with unrestricted hearing on the contralateral side: Secondary | ICD-10-CM | POA: Diagnosis not present

## 2020-08-02 DIAGNOSIS — H8102 Meniere's disease, left ear: Secondary | ICD-10-CM | POA: Diagnosis not present

## 2020-08-02 DIAGNOSIS — H903 Sensorineural hearing loss, bilateral: Secondary | ICD-10-CM | POA: Diagnosis not present

## 2021-02-22 ENCOUNTER — Encounter: Payer: Self-pay | Admitting: Family Medicine

## 2021-02-22 ENCOUNTER — Ambulatory Visit (INDEPENDENT_AMBULATORY_CARE_PROVIDER_SITE_OTHER): Payer: BC Managed Care – PPO | Admitting: Family Medicine

## 2021-02-22 ENCOUNTER — Other Ambulatory Visit: Payer: Self-pay

## 2021-02-22 VITALS — BP 120/80 | HR 78 | Temp 98.2°F | Ht 73.0 in | Wt 199.0 lb

## 2021-02-22 DIAGNOSIS — E785 Hyperlipidemia, unspecified: Secondary | ICD-10-CM | POA: Diagnosis not present

## 2021-02-22 DIAGNOSIS — Z7189 Other specified counseling: Secondary | ICD-10-CM

## 2021-02-22 DIAGNOSIS — Z23 Encounter for immunization: Secondary | ICD-10-CM | POA: Diagnosis not present

## 2021-02-22 DIAGNOSIS — K439 Ventral hernia without obstruction or gangrene: Secondary | ICD-10-CM

## 2021-02-22 DIAGNOSIS — G6281 Critical illness polyneuropathy: Secondary | ICD-10-CM

## 2021-02-22 DIAGNOSIS — Z0000 Encounter for general adult medical examination without abnormal findings: Secondary | ICD-10-CM

## 2021-02-22 DIAGNOSIS — K469 Unspecified abdominal hernia without obstruction or gangrene: Secondary | ICD-10-CM

## 2021-02-22 LAB — COMPREHENSIVE METABOLIC PANEL
AG Ratio: 2.2 (calc) (ref 1.0–2.5)
ALT: 20 U/L (ref 9–46)
AST: 19 U/L (ref 10–35)
Albumin: 4.6 g/dL (ref 3.6–5.1)
Alkaline phosphatase (APISO): 44 U/L (ref 35–144)
BUN/Creatinine Ratio: 11 (calc) (ref 6–22)
BUN: 15 mg/dL (ref 7–25)
CO2: 25 mmol/L (ref 20–32)
Calcium: 9.7 mg/dL (ref 8.6–10.3)
Chloride: 106 mmol/L (ref 98–110)
Creat: 1.33 mg/dL — ABNORMAL HIGH (ref 0.70–1.30)
Globulin: 2.1 g/dL (calc) (ref 1.9–3.7)
Glucose, Bld: 81 mg/dL (ref 65–99)
Potassium: 4.1 mmol/L (ref 3.5–5.3)
Sodium: 140 mmol/L (ref 135–146)
Total Bilirubin: 0.9 mg/dL (ref 0.2–1.2)
Total Protein: 6.7 g/dL (ref 6.1–8.1)

## 2021-02-22 LAB — LIPID PANEL
Cholesterol: 207 mg/dL — ABNORMAL HIGH (ref <200)
HDL: 63 mg/dL (ref >=40)
LDL Cholesterol (Calc): 130 mg/dL (calc) — ABNORMAL HIGH
Non-HDL Cholesterol (Calc): 144 mg/dL (calc) — ABNORMAL HIGH (ref <130)
Total CHOL/HDL Ratio: 3.3 (calc) (ref <5.0)
Triglycerides: 47 mg/dL (ref <150)

## 2021-02-22 NOTE — Progress Notes (Signed)
This visit occurred during the SARS-CoV-2 public health emergency.  Safety protocols were in place, including screening questions prior to the visit, additional usage of staff PPE, and extensive cleaning of exam room while observing appropriate contact time as indicated for disinfecting solutions.  CPE- See plan.  Routine anticipatory guidance given to patient.  See health maintenance.  The possibility exists that previously documented standard health maintenance information may have been brought forward from a previous encounter into this note.  If needed, that same information has been updated to reflect the current situation based on today's encounter.    Tetanus 2022 Flu today PNA not due  Shingles d/w pt.  Colonoscopy 2020.   Prostate cancer screening and PSA options (with potential risks and benefits of testing vs not testing) were discussed along with recent recs/guidelines.  He declined testing PSA at this point.   Living will d/w pt.  Wife designated if patient were incapacitated.   Diet and exercise d/w pt.  he is trying to walk as much as possible, even with pain from the neuropathy.  He is working on diet. HIV and HCV prev neg, d/w pt.    HLD.  Recheck labs pending.  See notes on labs.    Abd hernia noted, more annoying to patient compared to years past.  Referred.    Still putting up with B foot pain w/noted h/o med intolerances.  He was interested in treatment/procedure options that wouldn't cause ADE as with other meds. D/w pt about tertiary care referral.    PMH and SH reviewed  Meds, vitals, and allergies reviewed.   ROS: Per HPI.  Unless specifically indicated otherwise in HPI, the patient denies:  General: fever. Eyes: acute vision changes ENT: sore throat Cardiovascular: chest pain Respiratory: SOB GI: vomiting GU: dysuria Musculoskeletal: acute back pain Derm: acute rash Neuro: acute motor dysfunction Psych: worsening mood Endocrine: polydipsia Heme:  bleeding Allergy: hayfever  GEN: nad, alert and oriented HEENT: ncat NECK: supple w/o LA CV: rrr. PULM: ctab, no inc wob ABD: soft, +bs, soft abd hernia noted, easily reduced.   EXT: no edema SKIN: no acute rash

## 2021-02-22 NOTE — Patient Instructions (Addendum)
Let me see what I can find out [facility]   Take care.  Glad to see you. Flu and tetanus shots today.  Check with your insurance to see if they will cover the shingles shot.

## 2021-02-24 DIAGNOSIS — E785 Hyperlipidemia, unspecified: Secondary | ICD-10-CM | POA: Insufficient documentation

## 2021-02-24 NOTE — Assessment & Plan Note (Signed)
Still putting up with B foot pain w/noted h/o med intolerances.  He was interested in treatment/procedure options that wouldn't cause ADE as with other meds. D/w pt about tertiary care referral.  Referral placed.

## 2021-02-24 NOTE — Assessment & Plan Note (Signed)
Living will d/w pt.  Wife designated if patient were incapacitated.   ?

## 2021-02-24 NOTE — Assessment & Plan Note (Signed)
See notes on labs. 

## 2021-02-24 NOTE — Assessment & Plan Note (Signed)
Refer to gen surgery.

## 2021-02-24 NOTE — Assessment & Plan Note (Signed)
Tetanus 2022 Flu today PNA not due  Shingles d/w pt.  Colonoscopy 2020.   Prostate cancer screening and PSA options (with potential risks and benefits of testing vs not testing) were discussed along with recent recs/guidelines.  He declined testing PSA at this point.   Living will d/w pt.  Wife designated if patient were incapacitated.   Diet and exercise d/w pt.  he is trying to walk as much as possible, even with pain from the neuropathy.  He is working on diet. HIV and HCV prev neg, d/w pt.

## 2021-02-25 ENCOUNTER — Encounter: Payer: Self-pay | Admitting: *Deleted

## 2021-03-06 ENCOUNTER — Other Ambulatory Visit: Payer: Self-pay

## 2021-03-06 ENCOUNTER — Ambulatory Visit: Payer: BC Managed Care – PPO | Admitting: Surgery

## 2021-03-06 ENCOUNTER — Telehealth: Payer: Self-pay | Admitting: Surgery

## 2021-03-06 ENCOUNTER — Encounter: Payer: Self-pay | Admitting: Surgery

## 2021-03-06 VITALS — BP 155/104 | HR 72 | Temp 98.2°F | Ht 73.0 in | Wt 201.0 lb

## 2021-03-06 DIAGNOSIS — K439 Ventral hernia without obstruction or gangrene: Secondary | ICD-10-CM

## 2021-03-06 NOTE — Progress Notes (Signed)
03/06/2021  Reason for Visit:  Ventral hernia  Referring Provider:  Elsie Stain, MD  History of Present Illness: John Hoover is a 55 y.o. male presenting for evaluation of a ventral hernia.  The patient reports that he's had this for many years, probably between 7-10 years.  However, more recently it's becoming more bothersome.  He reports that initially it was easier to push in and when bulging would not cause discomfort.  Now as the day progresses, the hernia bulges more and is more uncomfortable, like a very hard workout type of soreness, and the hernia does not flatten like before.  Denies any nausea or vomiting and is unsure if the bulging gets worse when eating.  Reports increased discomfort when pushing or straining for a bowel movement.  The pain is localized to the supraumbilical area and does not radiate.  He walks everyday and completes at least 10,000 steps daily.  Denies any chest pain, shortness of breath.  Of note, he had a CT scan without contrast in 2017 during a hospital admission which I have personally viewed and which shows a small umbilical hernia and a small epigastric hernia, both containing fat.  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[place])    Flu    Meniere's disease    Neuropathy    Respiratory failure ([place])    VDRF flu 2017   Rhabdomyolysis      Past Surgical History: Past Surgical History:  Procedure Laterality Date   COLONOSCOPY WITH PROPOFOL N/A 07/21/2017   Procedure: COLONOSCOPY WITH PROPOFOL;  Surgeon: Jonathon Bellows, MD;  Location: [facility] ENDOSCOPY;  Service: Gastroenterology;  Laterality: N/A;   COLONOSCOPY WITH PROPOFOL N/A 07/23/2018   Procedure: COLONOSCOPY WITH PROPOFOL;  Surgeon: Jonathon Bellows, MD;  Location: [facility] ENDOSCOPY;  Service: Gastroenterology;  Laterality: N/A;   NASAL SINUS SURGERY     TONSILLECTOMY     WISDOM TOOTH EXTRACTION       Home Medications: Prior to Admission medications   None    Allergies: Allergies  Allergen Reactions   Cymbalta [Duloxetine Hcl]     Ineffective for pain.    Desloratadine     Ineffective    Fexofenadine     Ineffective    Gabapentin     intolerant   Loratadine     Ineffective    Lyrica [Pregabalin]     Intolerant, sedation    Social History:  reports that he has never smoked. He has never used smokeless tobacco. He reports current alcohol use. He reports that he does not use drugs.   Family History: Family History  Problem Relation Age of Onset   Cancer Mother 64       breast   Hypertension Mother    Hypertension Father    Colon cancer Neg Hx    Prostate cancer Neg Hx     Review of Systems: Review of Systems  Constitutional:  Negative for chills and fever.  HENT:  Negative for hearing loss.   Respiratory:  Negative for shortness of breath.   Cardiovascular:  Negative for chest pain.  Gastrointestinal:  Positive for abdominal pain. Negative for constipation, diarrhea, nausea and vomiting.  Genitourinary:  Negative for dysuria.  Musculoskeletal:  Negative for myalgias.  Skin:  Negative for rash.  Neurological:  Negative for dizziness.  Psychiatric/Behavioral:  Negative for depression.    Physical Exam BP (!) 155/104  Pulse 72   Temp 98.2 F (36.8 C)   Ht _0  (1.854 m)   Wt 201 lb (91.2 kg)   SpO2 98%   BMI 26.52 kg/m  CONSTITUTIONAL: No acute distress, well nourished. HEENT:  Normocephalic, atraumatic, extraocular motion intact. NECK: Trachea is midline, and there is no jugular venous distension.  RESPIRATORY:  Lungs are clear, and breath sounds are equal bilaterally. Normal respiratory effort without pathologic use of accessory muscles. CARDIOVASCULAR: Heart is regular without murmurs, gallops, or rubs. GI: The abdomen is soft, non-distended, with some discomfort to palpation over the umbilical and epigastric areas.  The patient has two hernias,  one at the umbilicus and one in the mid epigastric region, about 5 cm apart.  Both appear to contain fat, but both are only partially reducible to incarcerated.  No overlying skin changes. He also has diastasis recti of the upper abdomen, with separation of about 3 cm MUSCULOSKELETAL:  Normal muscle strength and tone in all four extremities.  No peripheral edema or cyanosis. SKIN: Skin turgor is normal. There are no pathologic skin lesions.  NEUROLOGIC:  Motor and sensation is grossly normal.  Cranial nerves are grossly intact. PSYCH:  Alert and oriented to person, place and time. Affect is normal.  Laboratory Analysis: Labs from 02/22/21: Na 140, K 4.1, Cl 106, CO2 25, BUN 15, Cr 1.33.  Total bili 0.9, AST 19, ALT 20, Alk Phos 44.    Assessment and Plan: This is a 55 y.o. male with ventral hernias.  --Discussed with the patient that he has two hernia defects which are small but are allowing contents to protrude and I think the fact that it's only partially reducible to incarcerated is what is causing him more symptoms now.  Given these findings I do not think that conservative measures alone would be appropriate, but there's also flexibility with timing for surgery and this does not to be emergent surgery.  Discussed with him that a robotic approach would be very helpful to be able to evaluate for any additional hernia sites and to be able to repair everything together and place one mesh to reinforce the repair.  Reviewed with him the surgery at length including risks of bleeding, infection, injury to surrounding structures, that this is outpatient surgery, post-op activity restrictions, pain control, and he's willing to proceed.  He will check with his schedule to see when it'll be a better timing for surgery and he will call us back to schedule.  Face-to-face time spent with the patient and care providers was 80 minutes, with more than 50% of the time spent counseling, educating, and coordinating  care of the patient.     Melvyn Neth, [facility]

## 2021-03-06 NOTE — H&P (View-Only) (Signed)
03/06/2021  Reason for Visit:  Ventral hernia  Referring Provider:  Elsie Stain, MD  History of Present Illness: John Hoover is a 55 y.o. male presenting for evaluation of a ventral hernia.  The patient reports that he's had this for many years, probably between 7-10 years.  However, more recently it's becoming more bothersome.  He reports that initially it was easier to push in and when bulging would not cause discomfort.  Now as the day progresses, the hernia bulges more and is more uncomfortable, like a very hard workout type of soreness, and the hernia does not flatten like before.  Denies any nausea or vomiting and is unsure if the bulging gets worse when eating.  Reports increased discomfort when pushing or straining for a bowel movement.  The pain is localized to the supraumbilical area and does not radiate.  He walks everyday and completes at least 10,000 steps daily.  Denies any chest pain, shortness of breath.  Of note, he had a CT scan without contrast in 2017 during a hospital admission which I have personally viewed and which shows a small umbilical hernia and a small epigastric hernia, both containing fat.  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[place])    Flu    Meniere's disease    Neuropathy    Respiratory failure ([place])    VDRF flu 2017   Rhabdomyolysis      Past Surgical History: Past Surgical History:  Procedure Laterality Date   COLONOSCOPY WITH PROPOFOL N/A 07/21/2017   Procedure: COLONOSCOPY WITH PROPOFOL;  Surgeon: Jonathon Bellows, MD;  Location: [facility] ENDOSCOPY;  Service: Gastroenterology;  Laterality: N/A;   COLONOSCOPY WITH PROPOFOL N/A 07/23/2018   Procedure: COLONOSCOPY WITH PROPOFOL;  Surgeon: Jonathon Bellows, MD;  Location: [facility] ENDOSCOPY;  Service: Gastroenterology;  Laterality: N/A;   NASAL SINUS SURGERY     TONSILLECTOMY     WISDOM TOOTH EXTRACTION       Home Medications: Prior to Admission medications   None    Allergies: Allergies  Allergen Reactions   Cymbalta [Duloxetine Hcl]     Ineffective for pain.    Desloratadine     Ineffective    Fexofenadine     Ineffective    Gabapentin     intolerant   Loratadine     Ineffective    Lyrica [Pregabalin]     Intolerant, sedation    Social History:  reports that he has never smoked. He has never used smokeless tobacco. He reports current alcohol use. He reports that he does not use drugs.   Family History: Family History  Problem Relation Age of Onset   Cancer Mother 64       breast   Hypertension Mother    Hypertension Father    Colon cancer Neg Hx    Prostate cancer Neg Hx     Review of Systems: Review of Systems  Constitutional:  Negative for chills and fever.  HENT:  Negative for hearing loss.   Respiratory:  Negative for shortness of breath.   Cardiovascular:  Negative for chest pain.  Gastrointestinal:  Positive for abdominal pain. Negative for constipation, diarrhea, nausea and vomiting.  Genitourinary:  Negative for dysuria.  Musculoskeletal:  Negative for myalgias.  Skin:  Negative for rash.  Neurological:  Negative for dizziness.  Psychiatric/Behavioral:  Negative for depression.    Physical Exam BP (!) 155/104  Pulse 72   Temp 98.2 F (36.8 C)   Ht _0  (1.854 m)   Wt 201 lb (91.2 kg)   SpO2 98%   BMI 26.52 kg/m  CONSTITUTIONAL: No acute distress, well nourished. HEENT:  Normocephalic, atraumatic, extraocular motion intact. NECK: Trachea is midline, and there is no jugular venous distension.  RESPIRATORY:  Lungs are clear, and breath sounds are equal bilaterally. Normal respiratory effort without pathologic use of accessory muscles. CARDIOVASCULAR: Heart is regular without murmurs, gallops, or rubs. GI: The abdomen is soft, non-distended, with some discomfort to palpation over the umbilical and epigastric areas.  The patient has two hernias,  one at the umbilicus and one in the mid epigastric region, about 5 cm apart.  Both appear to contain fat, but both are only partially reducible to incarcerated.  No overlying skin changes. He also has diastasis recti of the upper abdomen, with separation of about 3 cm MUSCULOSKELETAL:  Normal muscle strength and tone in all four extremities.  No peripheral edema or cyanosis. SKIN: Skin turgor is normal. There are no pathologic skin lesions.  NEUROLOGIC:  Motor and sensation is grossly normal.  Cranial nerves are grossly intact. PSYCH:  Alert and oriented to person, place and time. Affect is normal.  Laboratory Analysis: Labs from 02/22/21: Na 140, K 4.1, Cl 106, CO2 25, BUN 15, Cr 1.33.  Total bili 0.9, AST 19, ALT 20, Alk Phos 44.    Assessment and Plan: This is a 55 y.o. male with ventral hernias.  --Discussed with the patient that he has two hernia defects which are small but are allowing contents to protrude and I think the fact that it's only partially reducible to incarcerated is what is causing him more symptoms now.  Given these findings I do not think that conservative measures alone would be appropriate, but there's also flexibility with timing for surgery and this does not to be emergent surgery.  Discussed with him that a robotic approach would be very helpful to be able to evaluate for any additional hernia sites and to be able to repair everything together and place one mesh to reinforce the repair.  Reviewed with him the surgery at length including risks of bleeding, infection, injury to surrounding structures, that this is outpatient surgery, post-op activity restrictions, pain control, and he's willing to proceed.  He will check with his schedule to see when it'll be a better timing for surgery and he will call us back to schedule.  Face-to-face time spent with the patient and care providers was 80 minutes, with more than 50% of the time spent counseling, educating, and coordinating  care of the patient.     Melvyn Neth, [facility]

## 2021-03-06 NOTE — Telephone Encounter (Signed)
Patient states will call when ready to schedule surgery with Dr. Aleen Campi.

## 2021-03-06 NOTE — Patient Instructions (Addendum)
We have spoken to you about having your Umbilical Hernia be repaired. This would be scheduled with Dr. Aleen Campi at [facility].  Please see your (blue)pre-care sheet for information. Call us when you are ready to schedule surgery and our surgery scheduler will speak with you about this.   You will need to arrange to be off work for 1-2 weeks but will have to have a lifting restriction of no more than 15 lbs for 6 weeks following your surgery.   Umbilical Hernia, Adult A hernia is a bulge of tissue that pushes through an opening between muscles. An umbilical hernia happens in the abdomen, near the belly button (umbilicus). The hernia may contain tissues from the small intestine, large intestine, or fatty tissue covering the intestines (omentum). Umbilical hernias in adults tend to get worse over time, and they require surgical treatment.  There are several types of umbilical hernias. You may have: A hernia located just above or below the umbilicus (indirect hernia). This is the most common type of umbilical hernia in adults. A hernia that forms through an opening formed by the umbilicus (direct hernia). A hernia that comes and goes (reducible hernia). A reducible hernia may be visible only when you strain, lift something heavy, or cough. This type of hernia can be pushed back into the abdomen (reduced). A hernia that traps abdominal tissue inside the hernia (incarcerated hernia). This type of hernia cannot be reduced. A hernia that cuts off blood flow to the tissues inside the hernia (strangulated hernia). The tissues can start to die if this happens. This type of hernia requires emergency treatment.  What are the causes? An umbilical hernia happens when tissue inside the abdomen presses on a weak area of the abdominal muscles. What increases the risk? You may have a greater risk of this condition if you: Are obese. Have had several pregnancies. Have a buildup of fluid inside your abdomen  (ascites). Have had surgery that weakens the abdominal muscles.  What are the signs or symptoms? The main symptom of this condition is a painless bulge at or near the belly button. A reducible hernia may be visible only when you strain, lift something heavy, or cough. Other symptoms may include: Dull pain. A feeling of pressure.  Symptoms of a strangulated hernia may include: Pain that gets increasingly worse. Nausea and vomiting. Pain when pressing on the hernia. Skin over the hernia becoming red or purple. Constipation. Blood in the stool.  How is this diagnosed? This condition may be diagnosed based on: A physical exam. You may be asked to cough or strain while standing. These actions increase the pressure inside your abdomen and force the hernia through the opening in your muscles. Your health care provider may try to reduce the hernia by pressing on it. Your symptoms and medical history.  How is this treated? Surgery is the only treatment for an umbilical hernia. Surgery for a strangulated hernia is done as soon as possible. If you have a small hernia that is not incarcerated, you may need to lose weight before having surgery. Follow these instructions at home: Lose weight, if told by your health care provider. Do not try to push the hernia back in. Watch your hernia for any changes in color or size. Tell your health care provider if any changes occur. You may need to avoid activities that increase pressure on your hernia. Do not lift anything that is heavier than 10-15 lb (4.5 kg) until your health care provider says  that this is safe. Take over-the-counter and prescription medicines only as told by your health care provider. Keep all follow-up visits as told by your health care provider. This is important. Contact a health care provider if: Your hernia gets larger. Your hernia becomes painful. Get help right away if: You develop sudden, severe pain near the area of your  hernia. You have pain as well as nausea or vomiting. You have pain and the skin over your hernia changes color. You develop a fever. This information is not intended to replace advice given to you by your health care provider. Make sure you discuss any questions you have with your health care provider. Document Released: 10/19/2015 Document Revised: 01/20/2016 Document Reviewed: 10/19/2015 Elsevier Interactive Patient Education  [employer].

## 2021-03-07 ENCOUNTER — Telehealth: Payer: Self-pay | Admitting: Surgery

## 2021-03-07 NOTE — Telephone Encounter (Signed)
Outgoing call is made, left message for patient to call.  Please inform patient of Pre-Admission date/time, COVID Testing date and Surgery date.  Surgery Date: 03/14/21 Preadmission Testing Date: 03/11/21 (phone 1p-5p) Covid Testing Date: Not needed.   Also patient will need to call at [phone], between 1-3:00pm the day before surgery, to find out what time to arrive for surgery.

## 2021-03-08 NOTE — Telephone Encounter (Signed)
Patient returns call, he is now informed of all dates regarding his surgery, information given, patient verbalized understanding.

## 2021-03-11 ENCOUNTER — Other Ambulatory Visit: Payer: Self-pay

## 2021-03-11 ENCOUNTER — Encounter
Admission: RE | Admit: 2021-03-11 | Discharge: 2021-03-11 | Disposition: A | Discharge status: Home / Self Care | Payer: BC Managed Care – PPO | Source: Ambulatory Visit | Attending: Surgery | Admitting: Surgery

## 2021-03-11 NOTE — Patient Instructions (Addendum)
Your procedure is scheduled on: Thursday 03/14/21 Report to the Registration Desk on the 1st floor of the [facility]. To find out your arrival time, please call [phone] between 1PM - 3PM on: Wednesday 03/13/21  REMEMBER: Instructions that are not followed completely may result in serious medical risk, up to and including death; or upon the discretion of your surgeon and anesthesiologist your surgery may need to be rescheduled.  Do not eat food after midnight the night before surgery.  No gum chewing, lozengers or hard candies.  You may however, drink CLEAR liquids up to 2 hours before you are scheduled to arrive for your surgery. Do not drink anything within 2 hours of your scheduled arrival time.  Clear liquids include: - water  - apple juice without pulp - gatorade (not RED, PURPLE, OR BLUE) - black coffee or tea (Do NOT add milk or creamers to the coffee or tea) Do NOT drink anything that is not on this list.  TAKE THESE MEDICATIONS THE MORNING OF SURGERY WITH A SIP OF WATER:  One week prior to surgery: Stop Anti-inflammatories (NSAIDS) such as Advil, Aleve, Ibuprofen, Motrin, Naproxen, Naprosyn and Aspirin based products such as Excedrin, Goodys Powder, BC Powder. Stop ANY OVER THE COUNTER supplements until after surgery. You may however, continue to take Tylenol if needed for pain up until the day of surgery.  No Alcohol for 24 hours before or after surgery.  No Smoking including e-cigarettes for 24 hours prior to surgery.  No chewable tobacco products for at least 6 hours prior to surgery.  No nicotine patches on the day of surgery.  Do not use any "recreational" drugs for at least a week prior to your surgery.  Please be advised that the combination of cocaine and anesthesia may have negative outcomes, up to and including death. If you test positive for cocaine, your surgery will be cancelled.  On the morning of surgery brush your teeth with toothpaste and water,  you may rinse your mouth with mouthwash if you wish. Do not swallow any toothpaste or mouthwash.  Use CHG Soap or wipes as directed on instruction sheet.  Do not wear jewelry.  Do not wear lotions, powders, or cologne.   Do not shave body from the neck down 48 hours prior to surgery just in case you cut yourself which could leave a site for infection.  Also, freshly shaved skin may become irritated if using the CHG soap.  Contact lenses may not be worn into surgery.  Do not bring valuables to the hospital. [facility] is not responsible for any missing/lost belongings or valuables.   Notify your doctor if there is any change in your medical condition (cold, fever, infection).  Wear comfortable clothing (specific to your surgery type) to the hospital.  After surgery, you can help prevent lung complications by doing breathing exercises.  Take deep breaths and cough every 1-2 hours. Your doctor may order a device called an Incentive Spirometer to help you take deep breaths. When coughing or sneezing, hold a pillow firmly against your incision with both hands. This is called "splinting." Doing this helps protect your incision. It also decreases belly discomfort.  If you are being admitted to the hospital overnight, leave your suitcase in the car. After surgery it may be brought to your room.  If you are being discharged the day of surgery, you will not be allowed to drive home. You will need a responsible adult (18 years or older) to drive  you home and stay with you that night.   If you are taking public transportation, you will need to have a responsible adult (18 years or older) with you. Please confirm with your physician that it is acceptable to use public transportation.   Please call the [place] [facility] at [phone] if you have any questions about these instructions.  Surgery Visitation Policy:  Patients undergoing a surgery or procedure may have one family  member or support person with them as long as that person is not COVID-19 positive or experiencing its symptoms.  That person may remain in the waiting area during the procedure and may rotate out with other people.  Inpatient Visitation:    Visiting hours are 7 a.m. to 8 p.m. Up to two visitors ages 16+ are allowed at one time in a patient room. The visitors may rotate out with other people during the day. Visitors must check out when they leave, or other visitors will not be allowed. One designated support person may remain overnight. The visitor must pass COVID-19 screenings, use hand sanitizer when entering and exiting the patient's room and wear a mask at all times, including in the patient's room. Patients must also wear a mask when staff or their visitor are in the room. Masking is required regardless of vaccination status.

## 2021-03-13 ENCOUNTER — Other Ambulatory Visit: Payer: Self-pay

## 2021-03-13 ENCOUNTER — Encounter
Admission: RE | Admit: 2021-03-13 | Discharge: 2021-03-13 | Disposition: A | Discharge status: Home / Self Care | Payer: BC Managed Care – PPO | Source: Ambulatory Visit | Attending: Surgery | Admitting: Surgery

## 2021-03-13 DIAGNOSIS — Z01812 Encounter for preprocedural laboratory examination: Secondary | ICD-10-CM | POA: Insufficient documentation

## 2021-03-13 DIAGNOSIS — K436 Other and unspecified ventral hernia with obstruction, without gangrene: Secondary | ICD-10-CM | POA: Diagnosis not present

## 2021-03-13 DIAGNOSIS — Z888 Allergy status to other drugs, medicaments and biological substances status: Secondary | ICD-10-CM | POA: Diagnosis not present

## 2021-03-13 LAB — CBC
HCT: 43.7 % (ref 39.0–52.0)
Hemoglobin: 14.6 g/dL (ref 13.0–17.0)
MCH: 32.2 pg (ref 26.0–34.0)
MCHC: 33.4 g/dL (ref 30.0–36.0)
MCV: 96.3 fL (ref 80.0–100.0)
Platelets: 197 10*3/uL (ref 150–400)
RBC: 4.54 MIL/uL (ref 4.22–5.81)
RDW: 13.1 % (ref 11.5–15.5)
WBC: 6.1 10*3/uL (ref 4.0–10.5)
nRBC: 0 % (ref 0.0–0.2)

## 2021-03-13 MED ORDER — BUPIVACAINE LIPOSOME 1.3 % IJ SUSP: 20.0000 mL | Freq: Once | INTRAMUSCULAR | Status: DC

## 2021-03-13 MED ORDER — ORAL CARE MOUTH RINSE: 15.0000 mL | Freq: Once | OROMUCOSAL | Status: AC

## 2021-03-13 MED ORDER — LACTATED RINGERS IV SOLN: INTRAVENOUS | Status: DC

## 2021-03-13 MED ORDER — CHLORHEXIDINE GLUCONATE CLOTH 2 % EX PADS: 6.0000 | MEDICATED_PAD | Freq: Once | CUTANEOUS | Status: DC

## 2021-03-13 MED ORDER — CEFAZOLIN SODIUM-DEXTROSE 2-4 GM/100ML-% IV SOLN
2.0000 g | INTRAVENOUS | Status: AC
  Administered 2021-03-14: 2 g via INTRAVENOUS

## 2021-03-13 MED ORDER — CHLORHEXIDINE GLUCONATE CLOTH 2 % EX PADS
6.0000 | MEDICATED_PAD | Freq: Once | CUTANEOUS | Status: AC
  Administered 2021-03-14: 6 via TOPICAL

## 2021-03-13 MED ORDER — ACETAMINOPHEN 500 MG PO TABS: 1000.0000 mg | ORAL_TABLET | ORAL | Status: AC

## 2021-03-13 MED ORDER — CHLORHEXIDINE GLUCONATE 0.12 % MT SOLN: 15.0000 mL | Freq: Once | OROMUCOSAL | Status: AC

## 2021-03-14 ENCOUNTER — Encounter
Admission: RE | Disposition: A | Discharge status: Home / Self Care | Payer: Self-pay | Source: Home / Self Care | Attending: Surgery

## 2021-03-14 ENCOUNTER — Encounter: Payer: Self-pay | Admitting: Surgery

## 2021-03-14 ENCOUNTER — Ambulatory Visit
Admission: RE | Admit: 2021-03-14 | Discharge: 2021-03-14 | Disposition: A | Discharge status: Home / Self Care | Payer: BC Managed Care – PPO | Attending: Surgery | Admitting: Surgery

## 2021-03-14 ENCOUNTER — Ambulatory Visit: Payer: BC Managed Care – PPO | Admitting: Anesthesiology

## 2021-03-14 DIAGNOSIS — K436 Other and unspecified ventral hernia with obstruction, without gangrene: Secondary | ICD-10-CM | POA: Insufficient documentation

## 2021-03-14 DIAGNOSIS — K439 Ventral hernia without obstruction or gangrene: Secondary | ICD-10-CM

## 2021-03-14 DIAGNOSIS — Z888 Allergy status to other drugs, medicaments and biological substances status: Secondary | ICD-10-CM | POA: Diagnosis not present

## 2021-03-14 DIAGNOSIS — K430 Incisional hernia with obstruction, without gangrene: Secondary | ICD-10-CM

## 2021-03-14 DIAGNOSIS — E785 Hyperlipidemia, unspecified: Secondary | ICD-10-CM | POA: Diagnosis not present

## 2021-03-14 HISTORY — PX: XI ROBOTIC ASSISTED VENTRAL HERNIA: SHX6789

## 2021-03-14 HISTORY — PX: INSERTION OF MESH: SHX5868

## 2021-03-14 SURGERY — REPAIR, HERNIA, VENTRAL, ROBOT-ASSISTED
Anesthesia: General

## 2021-03-14 MED ORDER — BUPIVACAINE LIPOSOME 1.3 % IJ SUSP
INTRAMUSCULAR | Status: AC
  Filled 2021-03-14: qty 20

## 2021-03-14 MED ORDER — EPHEDRINE SULFATE 50 MG/ML IJ SOLN
INTRAMUSCULAR | Status: DC | PRN
  Administered 2021-03-14: 10 mg via INTRAVENOUS

## 2021-03-14 MED ORDER — LIDOCAINE HCL (CARDIAC) PF 100 MG/5ML IV SOSY
PREFILLED_SYRINGE | INTRAVENOUS | Status: DC | PRN
  Administered 2021-03-14: 100 mg via INTRAVENOUS

## 2021-03-14 MED ORDER — FENTANYL CITRATE (PF) 100 MCG/2ML IJ SOLN
INTRAMUSCULAR | Status: AC
  Filled 2021-03-14: qty 2

## 2021-03-14 MED ORDER — OXYCODONE HCL 5 MG/5ML PO SOLN: 5.0000 mg | Freq: Once | ORAL | Status: AC | PRN

## 2021-03-14 MED ORDER — ONDANSETRON HCL 4 MG/2ML IJ SOLN
INTRAMUSCULAR | Status: AC
  Filled 2021-03-14: qty 2

## 2021-03-14 MED ORDER — OXYCODONE HCL 5 MG PO TABS: 5.0000 mg | ORAL_TABLET | Freq: Once | ORAL | Status: AC | PRN

## 2021-03-14 MED ORDER — PHENYLEPHRINE HCL (PRESSORS) 10 MG/ML IV SOLN
INTRAVENOUS | Status: DC | PRN
  Administered 2021-03-14 (×2): 100 ug via INTRAVENOUS

## 2021-03-14 MED ORDER — SODIUM CHLORIDE 0.9 % IV SOLN
INTRAVENOUS | Status: DC | PRN
  Administered 2021-03-14: 25 ug/min via INTRAVENOUS

## 2021-03-14 MED ORDER — FENTANYL CITRATE (PF) 100 MCG/2ML IJ SOLN
INTRAMUSCULAR | Status: DC | PRN
  Administered 2021-03-14: 50 ug via INTRAVENOUS
  Administered 2021-03-14: 100 ug via INTRAVENOUS
  Administered 2021-03-14: 50 ug via INTRAVENOUS

## 2021-03-14 MED ORDER — CEFAZOLIN SODIUM-DEXTROSE 2-4 GM/100ML-% IV SOLN
INTRAVENOUS | Status: AC
  Filled 2021-03-14: qty 100

## 2021-03-14 MED ORDER — DEXAMETHASONE SODIUM PHOSPHATE 10 MG/ML IJ SOLN
INTRAMUSCULAR | Status: DC | PRN
  Administered 2021-03-14: 10 mg via INTRAVENOUS

## 2021-03-14 MED ORDER — ROCURONIUM BROMIDE 100 MG/10ML IV SOLN
INTRAVENOUS | Status: DC | PRN
  Administered 2021-03-14: 50 mg via INTRAVENOUS
  Administered 2021-03-14: 10 mg via INTRAVENOUS
  Administered 2021-03-14: 20 mg via INTRAVENOUS

## 2021-03-14 MED ORDER — MIDAZOLAM HCL 2 MG/2ML IJ SOLN
INTRAMUSCULAR | Status: DC | PRN
Start: 2021-03-14 — End: 2021-03-14
  Administered 2021-03-14: 2 mg via INTRAVENOUS

## 2021-03-14 MED ORDER — FENTANYL CITRATE (PF) 250 MCG/5ML IJ SOLN
INTRAMUSCULAR | Status: AC
  Filled 2021-03-14: qty 5

## 2021-03-14 MED ORDER — ONDANSETRON HCL 4 MG/2ML IJ SOLN
INTRAMUSCULAR | Status: DC | PRN
Start: 2021-03-14 — End: 2021-03-14
  Administered 2021-03-14 (×2): 4 mg via INTRAVENOUS

## 2021-03-14 MED ORDER — HYDROMORPHONE HCL 1 MG/ML IJ SOLN
INTRAMUSCULAR | Status: DC | PRN
  Administered 2021-03-14 (×2): .5 mg via INTRAVENOUS

## 2021-03-14 MED ORDER — OXYCODONE HCL 5 MG PO TABS: 5.0000 mg | ORAL_TABLET | ORAL | 0 refills | Status: DC | PRN

## 2021-03-14 MED ORDER — OXYCODONE HCL 5 MG PO TABS
ORAL_TABLET | ORAL | Status: AC
  Administered 2021-03-14: 5 mg via ORAL
  Filled 2021-03-14: qty 1

## 2021-03-14 MED ORDER — IBUPROFEN 600 MG PO TABS: 600.0000 mg | ORAL_TABLET | Freq: Three times a day (TID) | ORAL | 1 refills | Status: DC | PRN

## 2021-03-14 MED ORDER — BUPIVACAINE-EPINEPHRINE 0.25% -1:200000 IJ SOLN
INTRAMUSCULAR | Status: DC | PRN
  Administered 2021-03-14: 30 mL

## 2021-03-14 MED ORDER — HYDROMORPHONE HCL 1 MG/ML IJ SOLN
INTRAMUSCULAR | Status: AC
  Filled 2021-03-14: qty 1

## 2021-03-14 MED ORDER — KETOROLAC TROMETHAMINE 30 MG/ML IJ SOLN
INTRAMUSCULAR | Status: DC | PRN
  Administered 2021-03-14: 15 mg via INTRAVENOUS

## 2021-03-14 MED ORDER — PHENYLEPHRINE HCL (PRESSORS) 10 MG/ML IV SOLN
INTRAVENOUS | Status: AC
  Filled 2021-03-14: qty 2

## 2021-03-14 MED ORDER — SUCCINYLCHOLINE CHLORIDE 200 MG/10ML IV SOSY
PREFILLED_SYRINGE | INTRAVENOUS | Status: DC | PRN
  Administered 2021-03-14: 120 mg via INTRAVENOUS

## 2021-03-14 MED ORDER — ACETAMINOPHEN 10 MG/ML IV SOLN: 1000.0000 mg | Freq: Once | INTRAVENOUS | Status: DC | PRN

## 2021-03-14 MED ORDER — ACETAMINOPHEN 500 MG PO TABS: 1000.0000 mg | ORAL_TABLET | Freq: Four times a day (QID) | ORAL | Status: DC | PRN

## 2021-03-14 MED ORDER — MIDAZOLAM HCL 2 MG/2ML IJ SOLN
INTRAMUSCULAR | Status: AC
  Filled 2021-03-14: qty 2

## 2021-03-14 MED ORDER — PROPOFOL 10 MG/ML IV BOLUS
INTRAVENOUS | Status: DC | PRN
  Administered 2021-03-14: 200 mg via INTRAVENOUS

## 2021-03-14 MED ORDER — BUPIVACAINE-EPINEPHRINE (PF) 0.25% -1:200000 IJ SOLN
INTRAMUSCULAR | Status: AC
  Filled 2021-03-14: qty 30

## 2021-03-14 MED ORDER — SUGAMMADEX SODIUM 500 MG/5ML IV SOLN
INTRAVENOUS | Status: DC | PRN
  Administered 2021-03-14: 500 mg via INTRAVENOUS

## 2021-03-14 MED ORDER — FENTANYL CITRATE (PF) 100 MCG/2ML IJ SOLN
25.0000 ug | INTRAMUSCULAR | Status: DC | PRN
  Administered 2021-03-14: 50 ug via INTRAVENOUS

## 2021-03-14 MED ORDER — ACETAMINOPHEN 500 MG PO TABS
ORAL_TABLET | ORAL | Status: AC
  Administered 2021-03-14: 1000 mg via ORAL
  Filled 2021-03-14: qty 2

## 2021-03-14 MED ORDER — BUPIVACAINE LIPOSOME 1.3 % IJ SUSP
INTRAMUSCULAR | Status: DC | PRN
  Administered 2021-03-14: 20 mL

## 2021-03-14 MED ORDER — GLYCOPYRROLATE 0.2 MG/ML IJ SOLN
INTRAMUSCULAR | Status: DC | PRN
  Administered 2021-03-14: .2 mg via INTRAVENOUS

## 2021-03-14 MED ORDER — CHLORHEXIDINE GLUCONATE 0.12 % MT SOLN
OROMUCOSAL | Status: AC
  Administered 2021-03-14: 15 mL via OROMUCOSAL
  Filled 2021-03-14: qty 15

## 2021-03-14 MED ORDER — ONDANSETRON HCL 4 MG/2ML IJ SOLN
4.0000 mg | Freq: Once | INTRAMUSCULAR | Status: AC | PRN
  Administered 2021-03-14: 4 mg via INTRAVENOUS

## 2021-03-14 MED ORDER — LACTATED RINGERS IV SOLN: INTRAVENOUS | Status: DC

## 2021-03-14 MED ORDER — FENTANYL CITRATE (PF) 100 MCG/2ML IJ SOLN
INTRAMUSCULAR | Status: AC
  Administered 2021-03-14: 50 ug via INTRAVENOUS
  Filled 2021-03-14: qty 2

## 2021-03-14 SURGICAL SUPPLY — 60 items
ADH SKN CLS APL DERMABOND .7 (GAUZE/BANDAGES/DRESSINGS) ×2
APL PRP STRL LF DISP 70% ISPRP (MISCELLANEOUS) ×2
BLADE SURG SZ11 CARB STEEL (BLADE) ×3 IMPLANT
CANNULA REDUC XI 12-8 STAPL (CANNULA) ×3
CANNULA REDUCER 12-8 DVNC XI (CANNULA) ×2 IMPLANT
CHLORAPREP W/TINT 26 (MISCELLANEOUS) ×3 IMPLANT
COVER TIP SHEARS 8 DVNC (MISCELLANEOUS) ×2 IMPLANT
COVER TIP SHEARS 8MM DA VINCI (MISCELLANEOUS) ×3
DEFOGGER SCOPE WARMER CLEARIFY (MISCELLANEOUS) ×3 IMPLANT
DERMABOND ADVANCED (GAUZE/BANDAGES/DRESSINGS) ×1
DERMABOND ADVANCED .7 DNX12 (GAUZE/BANDAGES/DRESSINGS) ×2 IMPLANT
DRAPE ARM DVNC X/XI (DISPOSABLE) ×8 IMPLANT
DRAPE COLUMN DVNC XI (DISPOSABLE) ×2 IMPLANT
DRAPE DA VINCI XI ARM (DISPOSABLE) ×12
DRAPE DA VINCI XI COLUMN (DISPOSABLE) ×3
ELECT CAUTERY BLADE TIP 2.5 (TIP) ×3
ELECT REM PT RETURN 9FT ADLT (ELECTROSURGICAL) ×3
ELECTRODE CAUTERY BLDE TIP 2.5 (TIP) ×2 IMPLANT
ELECTRODE REM PT RTRN 9FT ADLT (ELECTROSURGICAL) ×2 IMPLANT
GAUZE 4X4 16PLY ~~LOC~~+RFID DBL (SPONGE) ×3 IMPLANT
GLOVE SURG SYN 7.0 (GLOVE) ×9 IMPLANT
GLOVE SURG SYN 7.0 PF PI (GLOVE) ×4 IMPLANT
GLOVE SURG SYN 7.5  E (GLOVE) ×9
GLOVE SURG SYN 7.5 E (GLOVE) ×6 IMPLANT
GLOVE SURG SYN 7.5 PF PI (GLOVE) ×4 IMPLANT
GOWN STRL REUS W/ TWL LRG LVL3 (GOWN DISPOSABLE) ×6 IMPLANT
GOWN STRL REUS W/TWL LRG LVL3 (GOWN DISPOSABLE) ×9
GRASPER SUT TROCAR 14GX15 (MISCELLANEOUS) ×3 IMPLANT
IRRIGATION STRYKERFLOW (MISCELLANEOUS) IMPLANT
IRRIGATOR STRYKERFLOW (MISCELLANEOUS)
IV NS 1000ML (IV SOLUTION)
IV NS 1000ML BAXH (IV SOLUTION) IMPLANT
KIT PINK PAD W/HEAD ARE REST (MISCELLANEOUS) ×3
KIT PINK PAD W/HEAD ARM REST (MISCELLANEOUS) ×2 IMPLANT
LABEL OR SOLS (LABEL) ×3 IMPLANT
MANIFOLD NEPTUNE II (INSTRUMENTS) ×3 IMPLANT
MESH VENT LT ST 10.2X15.2CM EL (Mesh General) ×1 IMPLANT
NDL INSUFFLATION 14GA 120MM (NEEDLE) ×2 IMPLANT
NEEDLE HYPO 22GX1.5 SAFETY (NEEDLE) ×3 IMPLANT
NEEDLE INSUFFLATION 14GA 120MM (NEEDLE) ×3 IMPLANT
OBTURATOR OPTICAL STANDARD 8MM (TROCAR) ×3
OBTURATOR OPTICAL STND 8 DVNC (TROCAR) ×2
OBTURATOR OPTICALSTD 8 DVNC (TROCAR) ×2 IMPLANT
PACK LAP CHOLECYSTECTOMY (MISCELLANEOUS) ×3 IMPLANT
PENCIL ELECTRO HAND CTR (MISCELLANEOUS) ×3 IMPLANT
SEAL CANN UNIV 5-8 DVNC XI (MISCELLANEOUS) ×4 IMPLANT
SEAL XI 5MM-8MM UNIVERSAL (MISCELLANEOUS) ×6
SET TUBE SMOKE EVAC HIGH FLOW (TUBING) ×3 IMPLANT
SOLUTION ELECTROLUBE (MISCELLANEOUS) ×3 IMPLANT
SPONGE T-LAP 18X18 ~~LOC~~+RFID (SPONGE) ×2 IMPLANT
STAPLER CANNULA SEAL DVNC XI (STAPLE) ×2 IMPLANT
STAPLER CANNULA SEAL XI (STAPLE) ×3
SUT MNCRL 4-0 (SUTURE) ×3
SUT MNCRL 4-0 27XMFL (SUTURE) ×2
SUT STRATAFIX PDS 30 CT-1 (SUTURE) ×3 IMPLANT
SUT VICRYL 0 AB UR-6 (SUTURE) ×6 IMPLANT
SUT VLOC 90 2/L VL 12 GS22 (SUTURE) ×7 IMPLANT
SUTURE MNCRL 4-0 27XMF (SUTURE) ×2 IMPLANT
TRAY FOLEY SLVR 16FR LF STAT (SET/KITS/TRAYS/PACK) ×3 IMPLANT
WATER STERILE IRR 500ML POUR (IV SOLUTION) ×2 IMPLANT

## 2021-03-14 NOTE — Transfer of Care (Signed)
Immediate Anesthesia Transfer of Care Note  Patient: John Hoover  Procedure(s) Performed: XI ROBOTIC ASSISTED VENTRAL HERNIA INSERTION OF MESH  Patient Location: PACU  Anesthesia Type:General  Level of Consciousness: awake, drowsy and patient cooperative  Airway & Oxygen Therapy: Patient Spontanous Breathing and Patient connected to face mask oxygen  Post-op Assessment: Report given to RN and Post -op Vital signs reviewed and stable  Post vital signs: Reviewed and stable  Last Vitals:  Vitals Value Taken Time  BP 134/93 03/14/21 1030  Temp 36.2 C 03/14/21 1027  Pulse 76 03/14/21 1031  Resp 12 03/14/21 1031  SpO2 95 % 03/14/21 1031  Vitals shown include unvalidated device data.  Last Pain:  Vitals:   03/14/21 0618  TempSrc: Oral  PainSc: 2          Complications: No notable events documented.

## 2021-03-14 NOTE — Anesthesia Procedure Notes (Signed)
Procedure Name: Intubation Date/Time: 03/14/2021 7:38 AM Performed by: Mohammed Kindle, CRNA Pre-anesthesia Checklist: Patient identified, Emergency Drugs available, Suction available and Patient being monitored Patient Re-evaluated:Patient Re-evaluated prior to induction Oxygen Delivery Method: Circle system utilized Preoxygenation: Pre-oxygenation with 100% oxygen Induction Type: IV induction Ventilation: Mask ventilation without difficulty Laryngoscope Size: McGraph and 4 Grade View: Grade I Tube size: 7.0 mm Number of attempts: 1 Airway Equipment and Method: Stylet Placement Confirmation: ETT inserted through vocal cords under direct vision, positive ETCO2, breath sounds checked- equal and bilateral and CO2 detector Secured at: 21 cm Tube secured with: Tape Dental Injury: Teeth and Oropharynx as per pre-operative assessment

## 2021-03-14 NOTE — Discharge Instructions (Signed)

## 2021-03-14 NOTE — Interval H&P Note (Signed)
History and Physical Interval Note:  03/14/2021 7:16 AM  [name]  has presented today for surgery, with the diagnosis of ventral hernia.  The various methods of treatment have been discussed with the patient and family. After consideration of risks, benefits and other options for treatment, the patient has consented to  Procedure(s): XI ROBOTIC ASSISTED VENTRAL HERNIA (N/A) as a surgical intervention.  The patient's history has been reviewed, patient examined, no change in status, stable for surgery.  I have reviewed the patient's chart and labs.  Questions were answered to the patient's satisfaction.     John Hoover

## 2021-03-14 NOTE — Anesthesia Preprocedure Evaluation (Signed)
Anesthesia Evaluation  Patient identified by MRN, date of birth, ID band Patient awake    Reviewed: Allergy & Precautions, NPO status , Patient's Chart, lab work & pertinent test results  History of Anesthesia Complications Negative for: history of anesthetic complications  Airway Mallampati: IV   Neck ROM: Full    Dental no notable dental hx.    Pulmonary neg pulmonary ROS,    Pulmonary exam normal breath sounds clear to auscultation       Cardiovascular Exercise Tolerance: Good negative cardio ROS Normal cardiovascular exam Rhythm:Regular Rate:Normal     Neuro/Psych PSYCHIATRIC DISORDERS Anxiety Menieres disease  Neuromuscular disease (polyneuropathy)    GI/Hepatic negative GI ROS,   Endo/Other  negative endocrine ROS  Renal/GU CRFRenal disease     Musculoskeletal   Abdominal   Peds  Hematology  (+) Blood dyscrasia, anemia ,   Anesthesia Other Findings   Reproductive/Obstetrics                             Anesthesia Physical Anesthesia Plan  ASA: 3  Anesthesia Plan: General   Post-op Pain Management:    Induction: Intravenous  PONV Risk Score and Plan: 2 and Ondansetron, Dexamethasone and Treatment may vary due to age or medical condition  Airway Management Planned: Oral ETT  Additional Equipment:   Intra-op Plan:   Post-operative Plan: Extubation in OR  Informed Consent: I have reviewed the patients History and Physical, chart, labs and discussed the procedure including the risks, benefits and alternatives for the proposed anesthesia with the patient or authorized representative who has indicated his/her understanding and acceptance.       Plan Discussed with: CRNA  Anesthesia Plan Comments:         Anesthesia Quick Evaluation

## 2021-03-14 NOTE — Op Note (Signed)
Procedure Date:  03/14/2021  Pre-operative Diagnosis:  Incarcerated Ventral hernias  Post-operative Diagnosis: Incarcerated Ventral hernias x 2  Procedure:  Robotic assisted Incarcerated Ventral Hernia Repair with mesh  Surgeon:  Howie Ill, MD  Anesthesia:  General endotracheal  Estimated Blood Loss:  10 ml  Specimens:  None  Complications:  None  Indications for Procedure:  This is a 55 y.o. male who presents with incarcerated ventral hernias, which were starting to become more symptomatic.  The options of surgery versus observation were reviewed with the patient and/or family. The risks of bleeding, abscess or infection, recurrence of symptoms, potential for an open procedure, injury to surrounding structures, and chronic pain were all discussed with the patient and was willing to proceed.  Description of Procedure: The patient was correctly identified in the preoperative area and brought into the operating room.  The patient was placed supine with VTE prophylaxis in place.  Appropriate time-outs were performed.  Anesthesia was induced and the patient was intubated.  Appropriate antibiotics were infused.  The abdomen was prepped and draped in a sterile fashion. The patient's hernia defect was marked with a marking pen.  A Veress needle was introduced in the left upper quadrant and pneumoperitoneum was obtained with appropriate pressures.  Using Optivue technique an 8 mm robotic port was introduced in the left lateral abdominal wall without complications.  Then a 12 mm port and 8 mm port were placed in LUQ and LLQ respectively under direct visualization.  The DaVinci platform was docked, camera targeted, and instruments placed under direct visualization.   I began exploring the abdominal wall.  The patient had an umbilical hernia containing fat, and there was a segment of omentum that was bulging through an epigastric hernia.  The epigastric hernia was reduced after dissecting  some adhesions of the omentum to the hernia sac and fascial edges, and all the omentum was brought back intraperitoneally.  Cautery was used for hemostasis.  Then the umbilical hernia was similarly reduced and this contained preperitoneal fat.  The peritoneum and preperitoneal fat surrounding the hernias were dissected free to clear the fascial edges for better mesh adhesion.  After all dissection was done, a ruler was introduced and the total length between superior and inferior edges of the hernia defects was 7.5 cm.  A 4 x 6 inch Bard Ventralight ST Echo mesh was then introduced through the 12 mm port, in addition to a 0 Stratafix and three 2-0 V-loc sutures.  The hernia defects were closed in a single running fashion with no complications.  Then a PMI was introduced through the center of our suture line and the positioning system was insufflated and the mesh was placed over the defect covering it entirely.  The mesh was the sutured in place circumferentially and through the center of the mesh using the V-loc sutures.  All needles were removed as well as the ruler and the balloon system.  The DaVinci platform was undocked and all instruments removed.  50 ml of Exparel solution combined with 0.5% bupivacaine with epi was infiltrated around the mesh and midline and port sites.  The 12-mm port was removed and the fascia was closed under direct visualization utilizing an Endo Close technique with 0 Vicryl suture.  The 8 mm ports were removed.  The 12 mm incision was closed in layers using 3-0 Vicryl and 4-0 Monocryl, and the other port incisions were closed with 4-0 Monocryl.  The wounds were cleaned and sealed with DermaBond.  The patient was emerged from anesthesia and extubated and brought to the recovery room for further management.  The patient tolerated the procedure well and all counts were correct at the end of the case.   Howie Ill, MD

## 2021-03-14 NOTE — Anesthesia Postprocedure Evaluation (Signed)
Anesthesia Post Note  Patient: John Hoover  Procedure(s) Performed: XI ROBOTIC ASSISTED VENTRAL HERNIA INSERTION OF MESH  Patient location during evaluation: PACU Anesthesia Type: General Level of consciousness: awake and alert, oriented and patient cooperative Pain management: pain level controlled Vital Signs Assessment: post-procedure vital signs reviewed and stable Respiratory status: spontaneous breathing, nonlabored ventilation and respiratory function stable Cardiovascular status: blood pressure returned to baseline and stable Postop Assessment: adequate PO intake Anesthetic complications: no   No notable events documented.   Last Vitals:  Vitals:   03/14/21 1115 03/14/21 1120  BP: 108/84 113/89  Pulse: 76 80  Resp: 12 18  Temp:    SpO2: 96% 97%    Last Pain:  Vitals:   03/14/21 1115  TempSrc:   PainSc: 2                  Reed Breech

## 2021-04-02 ENCOUNTER — Encounter: Payer: Self-pay | Admitting: Physician Assistant

## 2021-04-02 ENCOUNTER — Other Ambulatory Visit: Payer: Self-pay

## 2021-04-02 ENCOUNTER — Ambulatory Visit (INDEPENDENT_AMBULATORY_CARE_PROVIDER_SITE_OTHER): Payer: BC Managed Care – PPO | Admitting: Physician Assistant

## 2021-04-02 VITALS — BP 136/98 | HR 73 | Temp 99.1°F | Ht 73.0 in | Wt 198.0 lb

## 2021-04-02 DIAGNOSIS — Z09 Encounter for follow-up examination after completed treatment for conditions other than malignant neoplasm: Secondary | ICD-10-CM

## 2021-04-02 DIAGNOSIS — K439 Ventral hernia without obstruction or gangrene: Secondary | ICD-10-CM

## 2021-04-02 NOTE — Progress Notes (Signed)
[facility] POST-OP OFFICE VISIT  04/02/2021  HPI: John Hoover is a 55 y.o. male 18 days s/p robotic assisted laparoscopic incarcerated ventral hernia repair with mesh with Dr Aleen Campi  He is doing well at home He still has some central abdominal soreness but this is getting better. Only needed narcotic pain medicine for about three days. He is currently not sing anything.  No fever, chills, nausea, emesis, or bowel changes No issues with incisions No other complaints this afternoon.   Vital signs: BP (!) 136/98   Pulse 73   Temp 99.1 F (37.3 C) (Oral)   Ht 6\' 1"  (1.854 m)   Wt 198 lb (89.8 kg)   SpO2 98%   BMI 26.12 kg/m    Physical Exam: Constitutional: Well appearing male, NAD Abdomen: Soft, non-tender, non-distended, no rebound/guarding Skin: Laparoscopic incisions are well healed, no erythema or drainage   Assessment/Plan: This is a 55 y.o. male 18 days s/p robotic assisted laparoscopic incarcerated ventral hernia repair with mesh   - Pain control prn  - Reviewed wound care  - Reviewed lifting restrictions; 6 weeks total  - He can follow up on as needed basis; I will be happy to see him if questions/concerns arise   -- 56, PA-C [facility] 04/02/2021, 2:19 PM [phone] M-F: 7am - 4pm

## 2021-04-02 NOTE — Patient Instructions (Signed)
Please call if you have questions or concerns.  

## 2021-04-08 DIAGNOSIS — G6281 Critical illness polyneuropathy: Secondary | ICD-10-CM | POA: Diagnosis not present

## 2021-05-06 ENCOUNTER — Encounter: Payer: Self-pay | Admitting: Nurse Practitioner

## 2021-05-06 ENCOUNTER — Ambulatory Visit (INDEPENDENT_AMBULATORY_CARE_PROVIDER_SITE_OTHER): Payer: BC Managed Care – PPO | Admitting: Nurse Practitioner

## 2021-05-06 ENCOUNTER — Other Ambulatory Visit: Payer: Self-pay

## 2021-05-06 VITALS — BP 118/76 | HR 93 | Temp 97.9°F | Resp 14 | Ht 73.0 in | Wt 201.4 lb

## 2021-05-06 DIAGNOSIS — R0981 Nasal congestion: Secondary | ICD-10-CM | POA: Insufficient documentation

## 2021-05-06 DIAGNOSIS — R0982 Postnasal drip: Secondary | ICD-10-CM | POA: Diagnosis not present

## 2021-05-06 MED ORDER — FLUTICASONE PROPIONATE 50 MCG/ACT NA SUSP: 2.0000 | Freq: Every day | NASAL | 0 refills | Status: DC

## 2021-05-06 NOTE — Assessment & Plan Note (Signed)
Patient continues to [facility] as prescribed.  Continue to monitor.  Flonase precautions reviewed including inciting of epistaxis

## 2021-05-06 NOTE — Progress Notes (Signed)
Acute Office Visit  Subjective:    Patient ID: John Hoover, male    DOB: 26-Oct-1965, 55 y.o.   MRN: [id number]  Chief Complaint  Patient presents with   Nasal Congestion    Sx started around 04/25/21-runny nose, stuffy nose, post nasal drip, some cough-productive clear phlegm. No fever, no sore throat. Patient did a virtual visit online around 04/26/21 and was given Zpak and Benzonatate medication and this did help.    HPI Patient is in today for Nasal congestion   Symptoms started around 04/24/2021. Did an E-visit on 04/26/2021 was given z pak and tessalon perles, that helped. Having lingering congestion and PND.  Has been using pseudophed has helped some Coughing up phlegm that is clear  [place] at night to help him sleep Past Medical History:  Diagnosis Date   AKI (acute kidney injury) (HCC)    Anemia    Critical illness polyneuropathy (HCC)    Flu    Meniere's disease    Neuropathy    Respiratory failure (HCC)    VDRF flu 2017   Rhabdomyolysis     Past Surgical History:  Procedure Laterality Date   COLONOSCOPY WITH PROPOFOL N/A 07/21/2017   Procedure: COLONOSCOPY WITH PROPOFOL;  Surgeon: Wyline Mood, MD;  Location: [facility] ENDOSCOPY;  Service: Gastroenterology;  Laterality: N/A;   COLONOSCOPY WITH PROPOFOL N/A 07/23/2018   Procedure: COLONOSCOPY WITH PROPOFOL;  Surgeon: Wyline Mood, MD;  Location: [facility] ENDOSCOPY;  Service: Gastroenterology;  Laterality: N/A;   INSERTION OF MESH  03/14/2021   Procedure: INSERTION OF MESH;  Surgeon: Henrene Dodge, MD;  Location: [facility] ORS;  Service: General;;   NASAL SINUS SURGERY     TONSILLECTOMY     WISDOM TOOTH EXTRACTION     XI ROBOTIC ASSISTED VENTRAL HERNIA N/A 03/14/2021   Procedure: XI ROBOTIC ASSISTED VENTRAL HERNIA;  Surgeon: Henrene Dodge, MD;  Location: [facility] ORS;  Service: General;  Laterality: N/A;    Family History  Problem Relation Age of Onset   Cancer Mother 18       breast   Hypertension Mother     Hypertension Father    Colon cancer Neg Hx    Prostate cancer Neg Hx     Social History   Socioeconomic History   Marital status: Married    Spouse name: Drinda Butts   Number of children: 2   Years of education: 16   Highest education level: Not on file  Occupational History   Occupation: Works in Product/process development scientist    Comment: [employer]  Tobacco Use   Smoking status: Never   Smokeless tobacco: Never  Vaping Use   Vaping Use: Never used  Substance and Sexual Activity   Alcohol use: Yes    Comment: weekends   Drug use: Never   Sexual activity: Not on file  Other Topics Concern   Not on file  Social History Narrative   1 daughter (who has DM1) and 1 son   Married 30+ years   Social Determinants of Corporate investment banker Strain: Not on file  Food Insecurity: Not on file  Transportation Needs: Not on file  Physical Activity: Not on file  Stress: Not on file  Social Connections: Not on file  Intimate Partner Violence: Not on file    Outpatient Medications Prior to Visit  Medication Sig Dispense Refill   benzonatate (TESSALON) 200 MG capsule Take 200 mg by mouth 3 (three) times daily as needed.     No facility-administered medications prior  to visit.    Allergies  Allergen Reactions   Cymbalta [Duloxetine Hcl]     Felt drunk   Desloratadine     Drowsiness    Fexofenadine     Drowsiness    Gabapentin     Felt drunk   Loratadine     Drowsiness    Lyrica [Pregabalin]     Felt drunk    Review of Systems  Constitutional:  Negative for chills, fatigue and fever.  HENT:  Positive for congestion, postnasal drip and rhinorrhea. Negative for ear discharge, ear pain, sinus pressure, sinus pain and sore throat.   Respiratory:  Positive for cough. Negative for shortness of breath.   Cardiovascular:  Negative for chest pain.  Gastrointestinal:  Negative for constipation, diarrhea, nausea and vomiting.  Neurological:  Negative for headaches.      Objective:     Physical Exam Vitals and nursing note reviewed.  Constitutional:      Appearance: Normal appearance.  HENT:     Right Ear: Tympanic membrane, ear canal and external ear normal.     Left Ear: Tympanic membrane, ear canal and external ear normal.     Nose:     Right Sinus: No maxillary sinus tenderness or frontal sinus tenderness.     Left Sinus: No maxillary sinus tenderness or frontal sinus tenderness.     Mouth/Throat:     Mouth: Mucous membranes are moist.     Pharynx: Oropharynx is clear.  Eyes:     Extraocular Movements: Extraocular movements intact.     Pupils: Pupils are equal, round, and reactive to light.  Cardiovascular:     Rate and Rhythm: Normal rate and regular rhythm.  Pulmonary:     Effort: Pulmonary effort is normal.     Breath sounds: Normal breath sounds. No wheezing.  Abdominal:     General: Bowel sounds are normal.  Lymphadenopathy:     Cervical: No cervical adenopathy.  Neurological:     Mental Status: He is alert.    BP 118/76   Pulse 93   Temp 97.9 F (36.6 C)   Resp 14   Ht 6\' 1"  (1.854 m)   Wt 201 lb 6 oz (91.3 kg)   SpO2 97%   BMI 26.57 kg/m  Wt Readings from Last 3 Encounters:  05/06/21 201 lb 6 oz (91.3 kg)  04/02/21 198 lb (89.8 kg)  03/14/21 200 lb (90.7 kg)    Health Maintenance Due  Topic Date Due   Zoster Vaccines- Shingrix (1 of 2) Never done   COVID-19 Vaccine (4 - Booster for Pfizer series) 08/01/2020    There are no preventive care reminders to display for this patient.   No results found for: TSH Lab Results  Component Value Date   WBC 6.1 03/13/2021   HGB 14.6 03/13/2021   HCT 43.7 03/13/2021   MCV 96.3 03/13/2021   PLT 197 03/13/2021   Lab Results  Component Value Date   NA 140 02/22/2021   K 4.1 02/22/2021   CO2 25 02/22/2021   GLUCOSE 81 02/22/2021   BUN 15 02/22/2021   CREATININE 1.33 (H) 02/22/2021   BILITOT 0.9 02/22/2021   ALKPHOS 51 04/24/2020   AST 19 02/22/2021   ALT 20 02/22/2021   PROT 6.7  02/22/2021   ALBUMIN 4.2 04/24/2020   CALCIUM 9.7 02/22/2021   ANIONGAP 13 10/05/2015   GFR 59.19 (L) 04/24/2020   Lab Results  Component Value Date   CHOL 207 (H) 02/22/2021  Lab Results  Component Value Date   HDL 63 02/22/2021   Lab Results  Component Value Date   LDLCALC 130 (H) 02/22/2021   Lab Results  Component Value Date   TRIG 47 02/22/2021   Lab Results  Component Value Date   CHOLHDL 3.3 02/22/2021   Lab Results  Component Value Date   HGBA1C 5.9 (H) 09/15/2015       Assessment & Plan:   Problem List Items Addressed This Visit   None Visit Diagnoses     Nasal congestion    -  Primary   Relevant Medications   fluticasone (FLONASE) 50 MCG/ACT nasal spray   PND (post-nasal drip)       Relevant Medications   fluticasone (FLONASE) 50 MCG/ACT nasal spray        No orders of the defined types were placed in this encounter.  This visit occurred during the SARS-CoV-2 public health emergency.  Safety protocols were in place, including screening questions prior to the visit, additional usage of staff PPE, and extensive cleaning of exam room while observing appropriate contact time as indicated for disinfecting solutions.   Audria Nine, NP

## 2021-05-06 NOTE — Patient Instructions (Signed)
Nice to see you today I sent in a nose spray to your pharmacy. You symptoms should continue to resolve.  Follow up if symptoms fail to improve or they get worse

## 2021-05-06 NOTE — Assessment & Plan Note (Signed)
Flonase as directed.  Continue to monitor symptoms

## 2021-07-25 ENCOUNTER — Other Ambulatory Visit: Payer: Self-pay

## 2021-07-25 ENCOUNTER — Ambulatory Visit: Payer: BC Managed Care – PPO | Admitting: Family

## 2021-07-25 ENCOUNTER — Encounter: Payer: Self-pay | Admitting: Family

## 2021-07-25 VITALS — BP 142/90 | HR 98 | Ht 73.0 in | Wt 209.0 lb

## 2021-07-25 DIAGNOSIS — J029 Acute pharyngitis, unspecified: Secondary | ICD-10-CM | POA: Insufficient documentation

## 2021-07-25 DIAGNOSIS — J0110 Acute frontal sinusitis, unspecified: Secondary | ICD-10-CM | POA: Diagnosis not present

## 2021-07-25 LAB — POCT RAPID STREP A (OFFICE): Rapid Strep A Screen: NEGATIVE

## 2021-07-25 MED ORDER — AMOXICILLIN-POT CLAVULANATE 875-125 MG PO TABS
1.0000 | ORAL_TABLET | Freq: Two times a day (BID) | ORAL | 0 refills | Status: DC
Start: 2021-07-25 — End: 2021-11-29

## 2021-07-25 NOTE — Assessment & Plan Note (Signed)
Prescription given for augmentin 875/125 mg po bid for ten days. Pt to continue tylenol/ibuprofen prn sinus pain. Continue with humidifier prn and steam showers recommended as well. instructed If no symptom improvement in 48 hours please f/u ? ?

## 2021-07-25 NOTE — Progress Notes (Signed)
Established Patient Office Visit  Subjective:  Patient ID: John Hoover, male    DOB: 03-08-1966  Age: 56 y.o. MRN: [id number]  CC:  Chief Complaint  Patient presents with   Nasal Congestion    Pt stated--congested, coughing with green/brown mucus--1 week. Denied fever and did not tested for COVID.    HPI John Hoover is here today with concerns.   One week ago started with nasal congestion, sinus pressure, sore throat, and cough with productive sputum green yellow mucous. Sneezing with blood nose here and there. No ear pain. Not sob, no chest congestion.   No fever or chills.  Did at home covid test and was negative.   Did virtual visit and was given flonase and tessalon perrles with only mild relief.   Past Medical History:  Diagnosis Date   AKI (acute kidney injury) (HCC)    Anemia    Critical illness polyneuropathy (HCC)    Flu    Meniere's disease    Neuropathy    Respiratory failure (HCC)    VDRF flu 2017   Rhabdomyolysis     Past Surgical History:  Procedure Laterality Date   COLONOSCOPY WITH PROPOFOL N/A 07/21/2017   Procedure: COLONOSCOPY WITH PROPOFOL;  Surgeon: Wyline Mood, MD;  Location: [facility] ENDOSCOPY;  Service: Gastroenterology;  Laterality: N/A;   COLONOSCOPY WITH PROPOFOL N/A 07/23/2018   Procedure: COLONOSCOPY WITH PROPOFOL;  Surgeon: Wyline Mood, MD;  Location: [facility] ENDOSCOPY;  Service: Gastroenterology;  Laterality: N/A;   INSERTION OF MESH  03/14/2021   Procedure: INSERTION OF MESH;  Surgeon: Henrene Dodge, MD;  Location: [facility] ORS;  Service: General;;   NASAL SINUS SURGERY     TONSILLECTOMY     WISDOM TOOTH EXTRACTION     XI ROBOTIC ASSISTED VENTRAL HERNIA N/A 03/14/2021   Procedure: XI ROBOTIC ASSISTED VENTRAL HERNIA;  Surgeon: Henrene Dodge, MD;  Location: [facility] ORS;  Service: General;  Laterality: N/A;    Family History  Problem Relation Age of Onset   Cancer Mother 79       breast   Hypertension Mother    Hypertension Father     Colon cancer Neg Hx    Prostate cancer Neg Hx     Social History   Socioeconomic History   Marital status: Married    Spouse name: Drinda Butts   Number of children: 2   Years of education: 16   Highest education level: Not on file  Occupational History   Occupation: Works in Product/process development scientist    Comment: [employer]  Tobacco Use   Smoking status: Never   Smokeless tobacco: Never  Vaping Use   Vaping Use: Never used  Substance and Sexual Activity   Alcohol use: Yes    Comment: weekends   Drug use: Never   Sexual activity: Not on file  Other Topics Concern   Not on file  Social History Narrative   1 daughter (who has DM1) and 1 son   Married 30+ years   Social Determinants of Corporate investment banker Strain: Not on file  Food Insecurity: Not on file  Transportation Needs: Not on file  Physical Activity: Not on file  Stress: Not on file  Social Connections: Not on file  Intimate Partner Violence: Not on file    Outpatient Medications Prior to Visit  Medication Sig Dispense Refill   benzonatate (TESSALON) 200 MG capsule Take 200 mg by mouth 3 (three) times daily as needed.     fluticasone (FLONASE) 50  MCG/ACT nasal spray Place 2 sprays into both nostrils daily. 16 g 0   NALTREXONE HCL PO Take 3 mg by mouth.     No facility-administered medications prior to visit.    Allergies  Allergen Reactions   Cymbalta [Duloxetine Hcl]     Felt drunk   Desloratadine     Drowsiness    Fexofenadine     Drowsiness    Gabapentin     Felt drunk   Loratadine     Drowsiness    Lyrica [Pregabalin]     Felt drunk    ROS Review of Systems  Constitutional:  Negative for chills and fever.  HENT:  Positive for congestion, postnasal drip, rhinorrhea, sinus pain and sore throat. Negative for ear pain.   Respiratory:  Positive for cough and chest tightness. Negative for shortness of breath and wheezing.   Cardiovascular:  Negative for chest pain and palpitations.     Objective:     Physical Exam Constitutional:      General: He is awake. He is not in acute distress.    Appearance: Normal appearance. He is not ill-appearing.  HENT:     Right Ear: Hearing normal. A middle ear effusion (clear) is present. Tympanic membrane is not erythematous or retracted.     Left Ear: Hearing normal. A middle ear effusion (clear) is present. Tympanic membrane is not erythematous or retracted.     Nose: Mucosal edema and congestion present.     Right Turbinates: Not enlarged or swollen.     Left Turbinates: Not enlarged or swollen.     Right Sinus: Frontal sinus tenderness present. No maxillary sinus tenderness.     Left Sinus: Frontal sinus tenderness present. No maxillary sinus tenderness.     Mouth/Throat:     Mouth: Mucous membranes are moist.     Pharynx: Posterior oropharyngeal erythema present. No pharyngeal swelling or oropharyngeal exudate.  Eyes:     Extraocular Movements: Extraocular movements intact.     Pupils: Pupils are equal, round, and reactive to light.  Cardiovascular:     Rate and Rhythm: Normal rate and regular rhythm.  Pulmonary:     Effort: Pulmonary effort is normal.     Breath sounds: Normal breath sounds. No wheezing.  Neurological:     Mental Status: He is alert.    BP (!) 142/90    Pulse 98    Ht 6\' 1"  (1.854 m)    Wt 209 lb (94.8 kg)    SpO2 98%    BMI 27.57 kg/m  Wt Readings from Last 3 Encounters:  07/25/21 209 lb (94.8 kg)  05/06/21 201 lb 6 oz (91.3 kg)  04/02/21 198 lb (89.8 kg)     Health Maintenance Due  Topic Date Due   Zoster Vaccines- Shingrix (1 of 2) Never done   COVID-19 Vaccine (4 - Booster for Pfizer series) 08/01/2020    There are no preventive care reminders to display for this patient.  No results found for: TSH Lab Results  Component Value Date   WBC 6.1 03/13/2021   HGB 14.6 03/13/2021   HCT 43.7 03/13/2021   MCV 96.3 03/13/2021   PLT 197 03/13/2021   Lab Results  Component Value Date   NA 140 02/22/2021    K 4.1 02/22/2021   CO2 25 02/22/2021   GLUCOSE 81 02/22/2021   BUN 15 02/22/2021   CREATININE 1.33 (H) 02/22/2021   BILITOT 0.9 02/22/2021   ALKPHOS 51 04/24/2020   AST 19  02/22/2021   ALT 20 02/22/2021   PROT 6.7 02/22/2021   ALBUMIN 4.2 04/24/2020   CALCIUM 9.7 02/22/2021   ANIONGAP 13 10/05/2015   GFR 59.19 (L) 04/24/2020   Lab Results  Component Value Date   HGBA1C 5.9 (H) 09/15/2015      Assessment & Plan:   Problem List Items Addressed This Visit       Respiratory   Acute non-recurrent frontal sinusitis    Prescription given for augmentin 875/125 mg po bid for ten days. Pt to continue tylenol/ibuprofen prn sinus pain. Continue with humidifier prn and steam showers recommended as well. instructed If no symptom improvement in 48 hours please f/u       Relevant Medications   amoxicillin-clavulanate (AUGMENTIN) 875-125 MG tablet     Other   Sore throat - Primary    Strep in office, negative.       Relevant Medications   amoxicillin-clavulanate (AUGMENTIN) 875-125 MG tablet   Other Relevant Orders   POCT rapid strep A (Completed)    Meds ordered this encounter  Medications   amoxicillin-clavulanate (AUGMENTIN) 875-125 MG tablet    Sig: Take 1 tablet by mouth 2 (two) times daily.    Dispense:  20 tablet    Refill:  0    Order Specific Question:   Supervising Provider    Answer:   BEDSOLE, AMY E [[id number]]    Follow-up: No follow-ups on file.    Mort Sawyers, FNP

## 2021-07-25 NOTE — Assessment & Plan Note (Signed)
Strep in office, negative.

## 2021-11-29 ENCOUNTER — Encounter: Payer: Self-pay | Admitting: Family Medicine

## 2021-11-29 ENCOUNTER — Ambulatory Visit: Payer: BC Managed Care – PPO | Admitting: Family Medicine

## 2021-11-29 DIAGNOSIS — H6980 Other specified disorders of Eustachian tube, unspecified ear: Secondary | ICD-10-CM

## 2021-11-29 DIAGNOSIS — H8109 Meniere's disease, unspecified ear: Secondary | ICD-10-CM | POA: Insufficient documentation

## 2021-11-29 MED ORDER — FLUTICASONE PROPIONATE 50 MCG/ACT NA SUSP: 2.0000 | Freq: Every day | NASAL | 1 refills | Status: DC

## 2021-11-29 NOTE — Patient Instructions (Signed)
Use flonase daily gently try to pop you ears a few times a day.   If not better soon, then let me know and we'll work on setting up an ENT appointment.  Take care.  Glad to see you.

## 2021-11-29 NOTE — Progress Notes (Unsigned)
Left ear ringing x 2 weeks or so. Patient states it started and then went away but then came back after going swimming.  No R ear sx.  No FCNAVD.  No L ear pain.  H/o Meniere's in the past but had vertigo with that in the past.  No vertigo with recent events.  H/o L>R hearing loss at baseline. His wife always sits on his R side for that.    Meds, vitals, and allergies reviewed.   ROS: Per HPI unless specifically indicated in ROS section   Normal B TM inspection.  Slow TM movement on valsalva L TM but normal movement R TM.  Ringing/hearing was some better after valsalva.   Dec hearing whisper L ear.

## 2021-12-01 NOTE — Assessment & Plan Note (Addendum)
He has history of Mnire's but does not have vertigo now or with this episode.  When he did have a flare of Mnire's in the past he would have vertigo.  He also has a history of hearing loss at baseline.  He clearly has eustachian tube dysfunction on exam.  We talked about options If he is getting worse then he will let me know. Reasonable to treat for eustachian tube dysfunction.  Anatomy discussed with patient. Use flonase daily gently attempt to perform Valsalva a few times a day.   If not better soon, then he can let me know and we'll work on setting up an ENT appointment.  Neither the patient nor I thought he had a flare of Mnire's disease currently.

## 2021-12-19 ENCOUNTER — Encounter: Payer: Self-pay | Admitting: Family Medicine

## 2021-12-23 ENCOUNTER — Other Ambulatory Visit: Payer: Self-pay | Admitting: Family Medicine

## 2021-12-23 DIAGNOSIS — H9313 Tinnitus, bilateral: Secondary | ICD-10-CM

## 2021-12-23 DIAGNOSIS — H6980 Other specified disorders of Eustachian tube, unspecified ear: Secondary | ICD-10-CM

## 2022-01-08 DIAGNOSIS — H90A22 Sensorineural hearing loss, unilateral, left ear, with restricted hearing on the contralateral side: Secondary | ICD-10-CM | POA: Diagnosis not present

## 2022-01-08 DIAGNOSIS — H9312 Tinnitus, left ear: Secondary | ICD-10-CM | POA: Diagnosis not present

## 2022-01-15 ENCOUNTER — Telehealth: Payer: Self-pay | Admitting: *Deleted

## 2022-01-15 NOTE — Chronic Care Management (AMB) (Signed)
  Care Coordination  Outreach Note  01/15/2022 Name: John Hoover MRN: [id number] DOB: 12-Oct-1965   Care Coordination Outreach Attempts  A second unsuccessful outreach was attempted today to offer the patient with information about available care coordination services as a benefit of their health plan.     Follow Up Plan:  Additional outreach attempts will be made to offer the patient care coordination information and services.   Encounter Outcome:  No Answer  Burman Nieves, CCMA Care Coordination Care Guide Direct Dial: [phone]

## 2022-01-20 NOTE — Chronic Care Management (AMB) (Signed)
  Care Coordination  Outreach Note  01/20/2022 Name: John Hoover MRN: [id number] DOB: 1965-07-02   Care Coordination Outreach Attempts  A third unsuccessful outreach was attempted today to offer the patient with information about available care coordination services as a benefit of their health plan.   Follow Up Plan:  No further outreach attempts will be made at this time. We have been unable to contact the patient to offer or enroll patient in care coordination services  Encounter Outcome:  No Answer  Burman Nieves, [facility] Care Coordination Care Guide Direct Dial: [phone]

## 2022-01-22 DIAGNOSIS — H90A22 Sensorineural hearing loss, unilateral, left ear, with restricted hearing on the contralateral side: Secondary | ICD-10-CM | POA: Diagnosis not present

## 2022-01-22 DIAGNOSIS — H9312 Tinnitus, left ear: Secondary | ICD-10-CM | POA: Diagnosis not present

## 2022-01-24 ENCOUNTER — Other Ambulatory Visit: Payer: Self-pay | Admitting: Student

## 2022-01-24 DIAGNOSIS — H90A22 Sensorineural hearing loss, unilateral, left ear, with restricted hearing on the contralateral side: Secondary | ICD-10-CM

## 2022-02-17 DIAGNOSIS — H903 Sensorineural hearing loss, bilateral: Secondary | ICD-10-CM | POA: Diagnosis not present

## 2022-02-17 DIAGNOSIS — H9042 Sensorineural hearing loss, unilateral, left ear, with unrestricted hearing on the contralateral side: Secondary | ICD-10-CM | POA: Diagnosis not present

## 2022-02-17 DIAGNOSIS — H8102 Meniere's disease, left ear: Secondary | ICD-10-CM | POA: Diagnosis not present

## 2022-02-21 ENCOUNTER — Other Ambulatory Visit: Payer: BC Managed Care – PPO

## 2022-05-07 DIAGNOSIS — H9042 Sensorineural hearing loss, unilateral, left ear, with unrestricted hearing on the contralateral side: Secondary | ICD-10-CM | POA: Diagnosis not present

## 2022-05-07 DIAGNOSIS — H9313 Tinnitus, bilateral: Secondary | ICD-10-CM | POA: Diagnosis not present

## 2022-08-22 ENCOUNTER — Telehealth: Payer: Self-pay

## 2022-08-22 NOTE — Patient Outreach (Signed)
  Care Coordination   08/22/2022 Name: John Hoover MRN: [id number] DOB: [birth year]   Care Coordination Outreach Attempts:  An unsuccessful telephone outreach was attempted today to offer the patient information about available care coordination services as a benefit of their health plan. HIPAA compliant message left.   Follow Up Plan:  Additional outreach attempts will be made to offer the patient care coordination information and services.   Encounter Outcome:  No Answer   Care Coordination Interventions:  No, not indicated    Quinn Plowman [facility] [place] [phone] direct line

## 2022-09-17 ENCOUNTER — Other Ambulatory Visit: Payer: Self-pay | Admitting: Family Medicine

## 2022-09-17 DIAGNOSIS — E785 Hyperlipidemia, unspecified: Secondary | ICD-10-CM

## 2022-09-18 ENCOUNTER — Other Ambulatory Visit (INDEPENDENT_AMBULATORY_CARE_PROVIDER_SITE_OTHER): Payer: Commercial Managed Care - PPO

## 2022-09-18 DIAGNOSIS — E785 Hyperlipidemia, unspecified: Secondary | ICD-10-CM | POA: Diagnosis not present

## 2022-09-18 LAB — LIPID PANEL
Cholesterol: 198 mg/dL (ref 0–200)
HDL: 63.1 mg/dL (ref >39.00)
LDL Cholesterol: 123 mg/dL — ABNORMAL HIGH (ref 0–99)
NonHDL: 134.45
Total CHOL/HDL Ratio: 3
Triglycerides: 55 mg/dL (ref 0.0–149.0)
VLDL: 11 mg/dL (ref 0.0–40.0)

## 2022-09-18 LAB — COMPREHENSIVE METABOLIC PANEL
ALT: 19 U/L (ref 0–53)
AST: 19 U/L (ref 0–37)
Albumin: 4.3 g/dL (ref 3.5–5.2)
Alkaline Phosphatase: 57 U/L (ref 39–117)
BUN: 18 mg/dL (ref 6–23)
CO2: 26 mEq/L (ref 19–32)
Calcium: 9.3 mg/dL (ref 8.4–10.5)
Chloride: 105 mEq/L (ref 96–112)
Creatinine, Ser: 1.29 mg/dL (ref 0.40–1.50)
GFR: 60.92 mL/min (ref >60.00)
Glucose, Bld: 90 mg/dL (ref 70–99)
Potassium: 4 mEq/L (ref 3.5–5.1)
Sodium: 140 mEq/L (ref 135–145)
Total Bilirubin: 0.8 mg/dL (ref 0.2–1.2)
Total Protein: 6.6 g/dL (ref 6.0–8.3)

## 2022-09-25 ENCOUNTER — Ambulatory Visit (INDEPENDENT_AMBULATORY_CARE_PROVIDER_SITE_OTHER): Payer: Commercial Managed Care - PPO | Admitting: Family Medicine

## 2022-09-25 ENCOUNTER — Encounter: Payer: Self-pay | Admitting: Family Medicine

## 2022-09-25 VITALS — BP 120/82 | HR 81 | Temp 97.2°F | Ht 73.0 in | Wt 200.0 lb

## 2022-09-25 DIAGNOSIS — Z7189 Other specified counseling: Secondary | ICD-10-CM

## 2022-09-25 DIAGNOSIS — Z0000 Encounter for general adult medical examination without abnormal findings: Secondary | ICD-10-CM | POA: Diagnosis not present

## 2022-09-25 DIAGNOSIS — J301 Allergic rhinitis due to pollen: Secondary | ICD-10-CM

## 2022-09-25 DIAGNOSIS — G6281 Critical illness polyneuropathy: Secondary | ICD-10-CM

## 2022-09-25 DIAGNOSIS — H8109 Meniere's disease, unspecified ear: Secondary | ICD-10-CM

## 2022-09-25 NOTE — Progress Notes (Signed)
CPE- See plan.  Routine anticipatory guidance given to patient.  See health maintenance.  The possibility exists that previously documented standard health maintenance information may have been brought forward from a previous encounter into this note.  If needed, that same information has been updated to reflect the current situation based on today's encounter.    Tetanus 2022 Flu prev done PNA not due  Shingles d/w pt.  Colonoscopy 2020.   Prostate cancer screening and PSA options (with potential risks and benefits of testing vs not testing) were discussed along with recent recs/guidelines.  He declined testing PSA at this point.   Living will d/w pt.  Wife designated if patient were incapacitated.   Diet and exercise d/w pt.  he is trying to walk as much as possible, even with pain from the neuropathy.  He is working on diet. HIV and HCV prev neg, d/w pt.    Allegra D helped.  He was drowsy with plain allegra but could tolerate allegra D.    Neuropathy d/w pt.  He had tried mult meds.  "I'm used to it."  See vas.  D/w pt about possible SCS use.    D/w pt about Meniere's.  He had ENT f/u last year.  Sx improved after taking riboflavin supplement, unclear if from supplement or if it was going to improve on its own.  He is better in the meantime.    PMH and SH reviewed  Meds, vitals, and allergies reviewed.   ROS: Per HPI.  Unless specifically indicated otherwise in HPI, the patient denies:  General: fever. Eyes: acute vision changes ENT: sore throat Cardiovascular: chest pain Respiratory: SOB GI: vomiting GU: dysuria Musculoskeletal: acute back pain Derm: acute rash Neuro: acute motor dysfunction Psych: worsening mood Endocrine: polydipsia Heme: bleeding Allergy: hayfever  GEN: nad, alert and oriented HEENT: ncat NECK: supple w/o LA CV: rrr. PULM: ctab, no inc wob ABD: soft, +bs EXT: no edema SKIN: no acute rash

## 2022-09-25 NOTE — Patient Instructions (Addendum)
Please call about seeing Dr. Hetty Ely and ask about options.  He may talk to you about a spinal cord stimulator.    Bintrim, Corliss Parish, MD  [facility] [phone] [address] [place] [address] Directions [phone]  Update me as needed.   Take care.  Glad to see you.

## 2022-09-28 NOTE — Assessment & Plan Note (Signed)
Neuropathy d/w pt.  He had tried mult meds.  "I'm used to it."  See vas.  D/w pt about possible SCS use.  He is going to call the pain clinic about follow-up.

## 2022-09-28 NOTE — Assessment & Plan Note (Signed)
Allegra D helped.  He was drowsy with plain allegra but could tolerate allegra D.   Would continue as is.

## 2022-09-28 NOTE — Assessment & Plan Note (Signed)
Tetanus 2022 Flu prev done PNA not due  Shingles d/w pt.  Colonoscopy 2020.   Prostate cancer screening and PSA options (with potential risks and benefits of testing vs not testing) were discussed along with recent recs/guidelines.  He declined testing PSA at this point.   Living will d/w pt.  Wife designated if patient were incapacitated.   Diet and exercise d/w pt.  he is trying to walk as much as possible, even with pain from the neuropathy.  He is working on diet. HIV and HCV prev neg, d/w pt.

## 2022-09-28 NOTE — Assessment & Plan Note (Signed)
Living will d/w pt.  Wife designated if patient were incapacitated.   ?

## 2022-09-28 NOTE — Assessment & Plan Note (Signed)
D/w pt about Meniere's.  He had ENT f/u last year.  Sx improved after taking riboflavin supplement, unclear if from supplement or if it was going to improve on its own.  He is better in the meantime.  We both agreed that it made sense to continue the supplement in the meantime since he was doing better.

## 2023-09-22 ENCOUNTER — Encounter: Payer: Self-pay | Admitting: Nurse Practitioner

## 2023-09-22 ENCOUNTER — Ambulatory Visit: Payer: Self-pay

## 2023-09-22 ENCOUNTER — Ambulatory Visit: Admitting: Nurse Practitioner

## 2023-09-22 VITALS — BP 136/82 | HR 79 | Temp 98.1°F | Ht 73.0 in | Wt 206.0 lb

## 2023-09-22 DIAGNOSIS — R052 Subacute cough: Secondary | ICD-10-CM | POA: Diagnosis not present

## 2023-09-22 DIAGNOSIS — J40 Bronchitis, not specified as acute or chronic: Secondary | ICD-10-CM

## 2023-09-22 MED ORDER — FLUTICASONE PROPIONATE 50 MCG/ACT NA SUSP
2.0000 | Freq: Every day | NASAL | 0 refills | Status: DC
Start: 2023-09-22 — End: 2024-02-19

## 2023-09-22 MED ORDER — AZITHROMYCIN 250 MG PO TABS: ORAL_TABLET | ORAL | 0 refills | Status: AC

## 2023-09-22 NOTE — Assessment & Plan Note (Signed)
 Given length of illness and failed conservative treatments. Zpak 250mg  as directed

## 2023-09-22 NOTE — Progress Notes (Signed)
 Acute Office Visit  Subjective:     Patient ID: John Hoover, male    DOB: September 07, 1965, 58 y.o.   MRN: [id number]  Chief Complaint  Patient presents with   Cough    With discolored phlegm. Patient experiencing a head cold for the past 2-3 weeks. Also has Nasal congestion/runny nose. Patient has been taking allegra but makes him sleepy so he stopped taking it. In the beginning he took sudafed but wasn't helping so stopped.      Patient is in today for cough with a history of ETD, PND, HLD  Covid vaccine: original with booster Flu vaccine: UTD  Symptoms started approx 2-3 weeks ago No sick contacts Tried allegra and pseudophed that did not help.   Review of Systems  Constitutional:  Positive for malaise/fatigue. Negative for chills and fever.  HENT:  Negative for congestion, ear discharge, ear pain, sinus pain and sore throat.   Respiratory:  Positive for cough and sputum production. Negative for shortness of breath.   Cardiovascular:  Negative for chest pain.  Neurological:  Negative for headaches.        Objective:    BP 136/82 (BP Location: Left Arm, Patient Position: Sitting, Cuff Size: Normal)   Pulse 79   Temp 98.1 F (36.7 C) (Oral)   Ht 6\' 1"  (1.854 m)   Wt 206 lb (93.4 kg)   SpO2 99%   BMI 27.18 kg/m  BP Readings from Last 3 Encounters:  09/22/23 136/82  09/25/22 120/82  11/29/21 122/82   Wt Readings from Last 3 Encounters:  09/22/23 206 lb (93.4 kg)  09/25/22 200 lb (90.7 kg)  11/29/21 201 lb (91.2 kg)   SpO2 Readings from Last 3 Encounters:  09/22/23 99%  09/25/22 99%  11/29/21 99%      Physical Exam Vitals and nursing note reviewed.  Constitutional:      Appearance: Normal appearance.  HENT:     Right Ear: Tympanic membrane, ear canal and external ear normal.     Left Ear: Tympanic membrane, ear canal and external ear normal.     Nose:     Right Sinus: No maxillary sinus tenderness or frontal sinus tenderness.     Left Sinus: No  maxillary sinus tenderness or frontal sinus tenderness.     Mouth/Throat:     Mouth: Mucous membranes are moist.     Pharynx: Oropharynx is clear.  Cardiovascular:     Rate and Rhythm: Normal rate and regular rhythm.     Heart sounds: Normal heart sounds.  Pulmonary:     Effort: Pulmonary effort is normal.     Breath sounds: Normal breath sounds.  Lymphadenopathy:     Cervical: No cervical adenopathy.  Neurological:     Mental Status: He is alert.     No results found for any visits on 09/22/23.      Assessment & Plan:   Problem List Items Addressed This Visit       Respiratory   Bronchitis - Primary   Given length of illness and failed conservative treatments. Zpak 250mg  as directed      Relevant Medications   azithromycin  (ZITHROMAX ) 250 MG tablet     Other   Subacute cough   Flonase  as directed. He declines prescription anti-tussives       Relevant Medications   fluticasone  (FLONASE ) 50 MCG/ACT nasal spray    Meds ordered this encounter  Medications   azithromycin  (ZITHROMAX ) 250 MG tablet    Sig: Take  2 tablets on day 1, then 1 tablet daily on days 2 through 5    Dispense:  6 tablet    Refill:  0    Supervising Provider:   Deri Fleet A [[id number]]   fluticasone  (FLONASE ) 50 MCG/ACT nasal spray    Sig: Place 2 sprays into both nostrils daily.    Dispense:  16 g    Refill:  0    Supervising Provider:   Deri Fleet A [[id number]]    Return if symptoms worsen or fail to improve.  Margarie Shay, NP

## 2023-09-22 NOTE — Telephone Encounter (Signed)
 Copied from CRM [id number]. Topic: Clinical - Red Word Triage >> Sep 22, 2023  8:11 AM [place] E wrote: [facility] that prompted transfer to Nurse Triage: Discolored phlegm. Patient experiencing a head cold for the past 2-3 weeks. Nasal congestion,  and cough producing a yellow colored phlegm. Patient denies having a fever.  Chief Complaint: cough-  Symptoms: cough - productive with yellow sputum, runny nose at times,  Frequency: 2-3 weeks and worsening Pertinent Negatives: Patient denies fever Disposition: [] ED /[] Urgent Care (no appt availability in office) / [x] Appointment(In office/virtual)/ []  [facility]/ [] Home Care/ [] Refused Recommended Disposition /[] [facility]/ []  Follow-up with PCP Additional Notes: pt reports at times he has difficulty breathing because he has so much congestion inside that he constantly has to attempt to breath through. In office appt made for evaluation and treatment.  Reason for Disposition  Cough has been present for > 3 weeks  Answer Assessment - Initial Assessment Questions 1. ONSET: "When did the cough begin?"      2-3 weeks 2. SEVERITY: "How bad is the cough today?"      mild 3. SPUTUM: "Describe the color of your sputum" (none, dry cough; clear, white, yellow, green)     yellow 4. HEMOPTYSIS: "Are you coughing up any blood?" If so ask: "How much?" (flecks, streaks, tablespoons, etc.)     no 5. DIFFICULTY BREATHING: "Are you having difficulty breathing?" If Yes, ask: "How bad is it?" (e.g., mild, moderate, severe)    - MILD: No SOB at rest, mild SOB with walking, speaks normally in sentences, can lie down, no retractions, pulse < 100.    - MODERATE: SOB at rest, SOB with minimal exertion and prefers to sit, cannot lie down flat, speaks in phrases, mild retractions, audible wheezing, pulse 100-120.    - SEVERE: Very SOB at rest, speaks in single words, struggling to breathe, sitting hunched forward, retractions, pulse > 120       Mild - difficult to breath through the mucous 6. FEVER: "Do you have a fever?" If Yes, ask: "What is your temperature, how was it measured, and when did it start?"     no 7. CARDIAC HISTORY: "Do you have any history of heart disease?" (e.g., heart attack, congestive heart failure)      N/a 8. LUNG HISTORY: "Do you have any history of lung disease?"  (e.g., pulmonary embolus, asthma, emphysema)     N/a 9. PE RISK FACTORS: "Do you have a history of blood clots?" (or: recent major surgery, recent prolonged travel, bedridden)     N/a 10. OTHER SYMPTOMS: "Do you have any other symptoms?" (e.g., runny nose, wheezing, chest pain)       Runny nose 11. PREGNANCY: "Is there any chance you are pregnant?" "When was your last menstrual period?"       no 12. TRAVEL: "Have you traveled out of the country in the last month?" (e.g., travel history, exposures)       no  Protocols used: Cough - Acute Productive-A-AH

## 2023-09-22 NOTE — Telephone Encounter (Signed)
Noted. Will evaluate at office visit

## 2023-09-22 NOTE — Patient Instructions (Signed)
 Nice to see you today  I have sent in several prescriptions to the pharmacy  You can take an antihistamine (Allegra, Claritin, zyrtec) before bed to help dry this up  Follow up if you do not have improvement

## 2023-09-22 NOTE — Assessment & Plan Note (Signed)
 Flonase  as directed. He declines prescription anti-tussives

## 2023-09-22 NOTE — Telephone Encounter (Signed)
 Noted. Thanks.

## 2024-01-14 ENCOUNTER — Telehealth: Payer: Self-pay

## 2024-01-14 NOTE — Telephone Encounter (Signed)
 Copied from CRM [id number]. Topic: Clinical - Request for Lab/Test Order >> Jan 14, 2024  9:55 AM John Hoover wrote: Reason for CRM: Patient put in a schedule for a physical in September and would like blood work orders for the week prior. Patient is requesting if we can find a lab that accepts his insurance because last time he had to pay out of pocket for his blood work.    [phone] (M) or Mychart response

## 2024-02-15 ENCOUNTER — Other Ambulatory Visit: Payer: Self-pay | Admitting: Student

## 2024-02-15 DIAGNOSIS — H90A22 Sensorineural hearing loss, unilateral, left ear, with restricted hearing on the contralateral side: Secondary | ICD-10-CM

## 2024-02-19 ENCOUNTER — Ambulatory Visit (INDEPENDENT_AMBULATORY_CARE_PROVIDER_SITE_OTHER): Admitting: Family Medicine

## 2024-02-19 ENCOUNTER — Encounter: Payer: Self-pay | Admitting: Family Medicine

## 2024-02-19 VITALS — BP 118/74 | HR 68 | Temp 98.7°F | Ht 72.56 in | Wt 202.0 lb

## 2024-02-19 DIAGNOSIS — Z7189 Other specified counseling: Secondary | ICD-10-CM

## 2024-02-19 DIAGNOSIS — Z0000 Encounter for general adult medical examination without abnormal findings: Secondary | ICD-10-CM | POA: Diagnosis not present

## 2024-02-19 DIAGNOSIS — H8109 Meniere's disease, unspecified ear: Secondary | ICD-10-CM

## 2024-02-19 DIAGNOSIS — E785 Hyperlipidemia, unspecified: Secondary | ICD-10-CM

## 2024-02-19 DIAGNOSIS — M792 Neuralgia and neuritis, unspecified: Secondary | ICD-10-CM

## 2024-02-19 DIAGNOSIS — K5900 Constipation, unspecified: Secondary | ICD-10-CM

## 2024-02-19 MED ORDER — PREDNISONE 20 MG PO TABS: ORAL_TABLET | ORAL | 0 refills | Status: AC

## 2024-02-19 MED ORDER — LINACLOTIDE 145 MCG PO CAPS: 145.0000 ug | ORAL_CAPSULE | Freq: Every day | ORAL | 3 refills | Status: AC

## 2024-02-19 NOTE — Progress Notes (Signed)
 CPE- See plan.  Routine anticipatory guidance given to patient.  See health maintenance.  The possibility exists that previously documented standard health maintenance information may have been brought forward from a previous encounter into this note.  If needed, that same information has been updated to reflect the current situation based on today's encounter.    Tetanus 2022 Flu to be done yearly  PNA d/w pt.   Shingles d/w pt.  Colonoscopy 2020.   Prostate cancer screening and PSA options (with potential risks and benefits of testing vs not testing) were discussed along with recent recs/guidelines.  He declined testing PSA at this point.   Living will d/w pt.  Wife designated if patient were incapacitated.   Diet and exercise d/w pt.  He is trying to walk as much as possible, even with pain from the neuropathy.  He is working on diet. HIV and HCV prev neg, d/w pt.    He has longstanding constipation.  Had used dulcolax and senna.  Miralax  didn't help.  Discussed options.  Up-to-date on colonoscopy.  Neuropathy d/w pt.  He had tried mult meds.  I can put up with it.     D/w pt about Meniere's.  He had ENT f/u.  He had a flare with hearing loss that improved with recent prednisone  use.  D/w pt about having rx on hand in case he had another flare with the condition to call ENT if he had to use med.  Steroid cautions d/w pt.    H/o HLD.  Labs pending for [facility], d/w pt.    PMH and SH reviewed  Meds, vitals, and allergies reviewed.   ROS: Per HPI.  Unless specifically indicated otherwise in HPI, the patient denies:  General: fever. Eyes: acute vision changes ENT: sore throat Cardiovascular: chest pain Respiratory: SOB GI: vomiting GU: dysuria Musculoskeletal: acute back pain Derm: acute rash Neuro: acute motor dysfunction Psych: worsening mood Endocrine: polydipsia Heme: bleeding Allergy: hayfever  GEN: nad, alert and oriented HEENT: mucous membranes moist, TM wnl B NECK:  supple w/o LA CV: rrr. PULM: ctab, no inc wob ABD: soft, +bs EXT: trace BLE edema SKIN: no acute rash

## 2024-02-19 NOTE — Patient Instructions (Addendum)
 If needed, take prednisone  with food.  Don't take with aleve or ibuprofen . If you have to restart it, then call ENT.  Take care.  Glad to see you. [facility] to [facility].  Go to the lab on the way out.   If you have mychart we'll likely use that to update you.    Let me know if linzess  doesn't help.  Flu shot when possible.  Pneumonia 20 after that.

## 2024-02-20 LAB — COMPREHENSIVE METABOLIC PANEL WITH GFR
AG Ratio: 2.1 (calc) (ref 1.0–2.5)
ALT: 22 U/L (ref 9–46)
AST: 16 U/L (ref 10–35)
Albumin: 4.6 g/dL (ref 3.6–5.1)
Alkaline phosphatase (APISO): 56 U/L (ref 35–144)
BUN/Creatinine Ratio: 25 (calc) — ABNORMAL HIGH (ref 6–22)
BUN: 31 mg/dL — ABNORMAL HIGH (ref 7–25)
CO2: 21 mmol/L (ref 20–32)
Calcium: 9.8 mg/dL (ref 8.6–10.3)
Chloride: 102 mmol/L (ref 98–110)
Creat: 1.26 mg/dL (ref 0.70–1.35)
Globulin: 2.2 g/dL (ref 1.9–3.7)
Glucose, Bld: 104 mg/dL — ABNORMAL HIGH (ref 65–99)
Potassium: 4.1 mmol/L (ref 3.5–5.3)
Sodium: 137 mmol/L (ref 135–146)
Total Bilirubin: 0.6 mg/dL (ref 0.2–1.2)
Total Protein: 6.8 g/dL (ref 6.1–8.1)
eGFR: 65 mL/min/1.73m2 (ref >=60)

## 2024-02-20 LAB — LIPID PANEL
Cholesterol: 240 mg/dL — ABNORMAL HIGH (ref <200)
HDL: 95 mg/dL (ref >=40)
LDL Cholesterol (Calc): 129 mg/dL — ABNORMAL HIGH
Non-HDL Cholesterol (Calc): 145 mg/dL — ABNORMAL HIGH (ref <130)
Total CHOL/HDL Ratio: 2.5 (calc) (ref <5.0)
Triglycerides: 70 mg/dL (ref <150)

## 2024-02-21 ENCOUNTER — Ambulatory Visit: Payer: Self-pay | Admitting: Family Medicine

## 2024-02-21 DIAGNOSIS — K5900 Constipation, unspecified: Secondary | ICD-10-CM | POA: Insufficient documentation

## 2024-02-21 NOTE — Assessment & Plan Note (Signed)
 Tetanus 2022 Flu to be done yearly  PNA d/w pt.   Shingles d/w pt.  Colonoscopy 2020.   Prostate cancer screening and PSA options (with potential risks and benefits of testing vs not testing) were discussed along with recent recs/guidelines.  He declined testing PSA at this point.   Living will d/w pt.  Wife designated if patient were incapacitated.   Diet and exercise d/w pt.  He is trying to walk as much as possible, even with pain from the neuropathy.  He is working on diet. HIV and HCV prev neg, d/w pt.

## 2024-02-21 NOTE — Assessment & Plan Note (Signed)
 See notes on follow-up labs.

## 2024-02-21 NOTE — Assessment & Plan Note (Signed)
 He had ENT f/u.  He had a flare with hearing loss that improved with recent prednisone  use.  D/w pt about having rx on hand in case he had another flare with the condition to call ENT if he had to use med.  Steroid cautions d/w pt. he agrees to plan.

## 2024-02-21 NOTE — Assessment & Plan Note (Signed)
 He failed treatment with multiple over-the-counter agents.  Reasonable to try Linzess  with routine cautions.  He can update me as needed.

## 2024-02-21 NOTE — Assessment & Plan Note (Signed)
 Neuropathy d/w pt.  He had tried mult meds.  I can put up with it.   I asked him to update me as needed.

## 2024-02-21 NOTE — Assessment & Plan Note (Signed)
 Living will d/w pt.  Wife designated if patient were incapacitated.   ?

## 2024-02-22 ENCOUNTER — Other Ambulatory Visit (HOSPITAL_COMMUNITY): Payer: Self-pay

## 2024-02-22 ENCOUNTER — Telehealth: Payer: Self-pay

## 2024-02-22 NOTE — Telephone Encounter (Signed)
 Pharmacy Patient Advocate Encounter  Received notification from [facility] that Prior Authorization for Linzess  145 has been APPROVED from 02/22/24 to 02/21/25. Ran test claim, Copay is $35.00 for a 90 day supply. This test claim was processed through [facility]- copay amounts may vary at other pharmacies due to pharmacy/plan contracts, or as the patient moves through the different stages of their insurance plan.   PA #/Case ID/Reference #: # [id number]

## 2024-02-22 NOTE — Telephone Encounter (Signed)
 Pharmacy Patient Advocate Encounter   Received notification from Pt Calls Messages that prior authorization for Linzess  145 is required/requested.   Insurance verification completed.   The patient is insured through [facility] .   Per test claim: PA required; PA submitted to above mentioned insurance via Latent Key/confirmation #/EOC [id number] Status is pending

## 2024-02-23 NOTE — Telephone Encounter (Signed)
Noted. Thanks.   Please notify pt.

## 2024-02-23 NOTE — Telephone Encounter (Signed)
 Called patient to advise that his medication has been approved. Unable to leave a voicemail
